# Patient Record
Sex: Female | Born: 1975 | Race: Black or African American | Hispanic: No | Marital: Single | Smoking: Former smoker
Health system: Southern US, Community
[De-identification: ages and names within clinical notes are randomized; demographics above are authoritative.]

## PROBLEM LIST (undated history)

## (undated) ENCOUNTER — Emergency Department (HOSPITAL_COMMUNITY): Payer: Medicaid Other

## (undated) DIAGNOSIS — F32A Depression, unspecified: Secondary | ICD-10-CM

## (undated) DIAGNOSIS — F419 Anxiety disorder, unspecified: Secondary | ICD-10-CM

## (undated) DIAGNOSIS — D219 Benign neoplasm of connective and other soft tissue, unspecified: Secondary | ICD-10-CM

## (undated) DIAGNOSIS — F329 Major depressive disorder, single episode, unspecified: Secondary | ICD-10-CM

## (undated) DIAGNOSIS — M79606 Pain in leg, unspecified: Secondary | ICD-10-CM

## (undated) DIAGNOSIS — Z8719 Personal history of other diseases of the digestive system: Secondary | ICD-10-CM

## (undated) DIAGNOSIS — G43909 Migraine, unspecified, not intractable, without status migrainosus: Secondary | ICD-10-CM

## (undated) DIAGNOSIS — M5430 Sciatica, unspecified side: Secondary | ICD-10-CM

## (undated) HISTORY — DX: Depression, unspecified: F32.A

## (undated) HISTORY — DX: Benign neoplasm of connective and other soft tissue, unspecified: D21.9

## (undated) HISTORY — PX: WISDOM TOOTH EXTRACTION: SHX21

## (undated) HISTORY — DX: Anxiety disorder, unspecified: F41.9

## (undated) HISTORY — PX: TUBAL LIGATION: SHX77

---
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
## 1999-09-14 ENCOUNTER — Emergency Department (HOSPITAL_COMMUNITY): Admission: EM | Admit: 1999-09-14 | Discharge: 1999-09-14 | Payer: Self-pay | Admitting: Internal Medicine

## 2002-08-07 ENCOUNTER — Inpatient Hospital Stay (HOSPITAL_COMMUNITY): Admission: AD | Admit: 2002-08-07 | Discharge: 2002-08-07 | Payer: Self-pay | Admitting: Obstetrics and Gynecology

## 2002-12-06 ENCOUNTER — Inpatient Hospital Stay (HOSPITAL_COMMUNITY): Admission: AD | Admit: 2002-12-06 | Discharge: 2002-12-06 | Payer: Self-pay | Admitting: Obstetrics and Gynecology

## 2003-01-18 ENCOUNTER — Emergency Department (HOSPITAL_COMMUNITY): Admission: EM | Admit: 2003-01-18 | Discharge: 2003-01-18 | Payer: Self-pay | Admitting: Emergency Medicine

## 2003-01-20 ENCOUNTER — Emergency Department (HOSPITAL_COMMUNITY): Admission: EM | Admit: 2003-01-20 | Discharge: 2003-01-20 | Payer: Self-pay | Admitting: *Deleted

## 2003-02-10 ENCOUNTER — Ambulatory Visit (HOSPITAL_COMMUNITY): Admission: RE | Admit: 2003-02-10 | Discharge: 2003-02-10 | Payer: Self-pay | Admitting: *Deleted

## 2003-03-18 ENCOUNTER — Inpatient Hospital Stay (HOSPITAL_COMMUNITY): Admission: AD | Admit: 2003-03-18 | Discharge: 2003-03-18 | Payer: Self-pay | Admitting: *Deleted

## 2003-03-20 ENCOUNTER — Encounter: Admission: RE | Admit: 2003-03-20 | Discharge: 2003-03-20 | Payer: Self-pay | Admitting: Family Medicine

## 2003-04-10 ENCOUNTER — Ambulatory Visit (HOSPITAL_COMMUNITY): Admission: RE | Admit: 2003-04-10 | Discharge: 2003-04-10 | Payer: Self-pay | Admitting: *Deleted

## 2003-04-10 ENCOUNTER — Encounter: Admission: RE | Admit: 2003-04-10 | Discharge: 2003-04-10 | Payer: Self-pay | Admitting: Family Medicine

## 2003-04-16 ENCOUNTER — Inpatient Hospital Stay (HOSPITAL_COMMUNITY): Admission: AD | Admit: 2003-04-16 | Discharge: 2003-04-16 | Payer: Self-pay | Admitting: Obstetrics and Gynecology

## 2003-04-24 ENCOUNTER — Encounter: Admission: RE | Admit: 2003-04-24 | Discharge: 2003-04-24 | Payer: Self-pay | Admitting: Family Medicine

## 2003-05-08 ENCOUNTER — Encounter: Admission: RE | Admit: 2003-05-08 | Discharge: 2003-05-08 | Payer: Self-pay | Admitting: Family Medicine

## 2003-05-22 ENCOUNTER — Encounter: Admission: RE | Admit: 2003-05-22 | Discharge: 2003-05-22 | Payer: Self-pay | Admitting: *Deleted

## 2003-05-26 ENCOUNTER — Inpatient Hospital Stay (HOSPITAL_COMMUNITY): Admission: AD | Admit: 2003-05-26 | Discharge: 2003-05-26 | Payer: Self-pay | Admitting: Obstetrics & Gynecology

## 2003-06-05 ENCOUNTER — Ambulatory Visit (HOSPITAL_COMMUNITY): Admission: RE | Admit: 2003-06-05 | Discharge: 2003-06-05 | Payer: Self-pay | Admitting: *Deleted

## 2003-06-05 ENCOUNTER — Encounter: Admission: RE | Admit: 2003-06-05 | Discharge: 2003-06-05 | Payer: Self-pay | Admitting: *Deleted

## 2003-06-06 ENCOUNTER — Inpatient Hospital Stay (HOSPITAL_COMMUNITY): Admission: RE | Admit: 2003-06-06 | Discharge: 2003-06-06 | Payer: Self-pay | Admitting: *Deleted

## 2003-06-09 ENCOUNTER — Inpatient Hospital Stay (HOSPITAL_COMMUNITY): Admission: AD | Admit: 2003-06-09 | Discharge: 2003-06-09 | Payer: Self-pay | Admitting: Family Medicine

## 2003-06-12 ENCOUNTER — Encounter: Admission: RE | Admit: 2003-06-12 | Discharge: 2003-06-12 | Payer: Self-pay | Admitting: Family Medicine

## 2003-06-12 ENCOUNTER — Inpatient Hospital Stay (HOSPITAL_COMMUNITY): Admission: AD | Admit: 2003-06-12 | Discharge: 2003-06-12 | Payer: Self-pay | Admitting: Obstetrics & Gynecology

## 2003-06-14 ENCOUNTER — Inpatient Hospital Stay (HOSPITAL_COMMUNITY): Admission: AD | Admit: 2003-06-14 | Discharge: 2003-06-14 | Payer: Self-pay | Admitting: Family Medicine

## 2003-06-18 ENCOUNTER — Inpatient Hospital Stay (HOSPITAL_COMMUNITY): Admission: AD | Admit: 2003-06-18 | Discharge: 2003-06-22 | Payer: Self-pay | Admitting: Obstetrics and Gynecology

## 2003-06-20 ENCOUNTER — Encounter (INDEPENDENT_AMBULATORY_CARE_PROVIDER_SITE_OTHER): Payer: Self-pay | Admitting: *Deleted

## 2005-03-09 ENCOUNTER — Ambulatory Visit: Payer: Self-pay | Admitting: Internal Medicine

## 2005-07-25 ENCOUNTER — Emergency Department (HOSPITAL_COMMUNITY): Admission: EM | Admit: 2005-07-25 | Discharge: 2005-07-25 | Payer: Self-pay | Admitting: Family Medicine

## 2005-08-21 ENCOUNTER — Emergency Department (HOSPITAL_COMMUNITY): Admission: EM | Admit: 2005-08-21 | Discharge: 2005-08-21 | Payer: Self-pay | Admitting: Emergency Medicine

## 2007-03-12 ENCOUNTER — Emergency Department (HOSPITAL_COMMUNITY): Admission: EM | Admit: 2007-03-12 | Discharge: 2007-03-12 | Payer: Self-pay | Admitting: Family Medicine

## 2007-07-04 ENCOUNTER — Emergency Department (HOSPITAL_COMMUNITY): Admission: EM | Admit: 2007-07-04 | Discharge: 2007-07-04 | Payer: Self-pay | Admitting: Emergency Medicine

## 2007-11-20 ENCOUNTER — Emergency Department (HOSPITAL_COMMUNITY): Admission: EM | Admit: 2007-11-20 | Discharge: 2007-11-20 | Payer: Self-pay | Admitting: Emergency Medicine

## 2008-02-23 ENCOUNTER — Emergency Department (HOSPITAL_COMMUNITY): Admission: EM | Admit: 2008-02-23 | Discharge: 2008-02-23 | Payer: Self-pay | Admitting: Emergency Medicine

## 2008-03-04 ENCOUNTER — Emergency Department (HOSPITAL_COMMUNITY): Admission: EM | Admit: 2008-03-04 | Discharge: 2008-03-04 | Payer: Self-pay | Admitting: Family Medicine

## 2008-03-22 ENCOUNTER — Emergency Department (HOSPITAL_COMMUNITY): Admission: EM | Admit: 2008-03-22 | Discharge: 2008-03-23 | Payer: Self-pay | Admitting: Emergency Medicine

## 2008-04-16 ENCOUNTER — Emergency Department (HOSPITAL_COMMUNITY): Admission: EM | Admit: 2008-04-16 | Discharge: 2008-04-16 | Payer: Self-pay | Admitting: Emergency Medicine

## 2009-03-03 ENCOUNTER — Emergency Department (HOSPITAL_COMMUNITY): Admission: EM | Admit: 2009-03-03 | Discharge: 2009-03-03 | Payer: Self-pay | Admitting: Emergency Medicine

## 2009-05-04 ENCOUNTER — Emergency Department (HOSPITAL_COMMUNITY): Admission: EM | Admit: 2009-05-04 | Discharge: 2009-05-04 | Payer: Self-pay | Admitting: Emergency Medicine

## 2010-04-27 ENCOUNTER — Emergency Department (HOSPITAL_COMMUNITY): Admission: EM | Admit: 2010-04-27 | Discharge: 2010-04-27 | Payer: Self-pay | Admitting: Emergency Medicine

## 2010-09-13 ENCOUNTER — Emergency Department (HOSPITAL_COMMUNITY): Admission: EM | Admit: 2010-09-13 | Discharge: 2010-09-13 | Payer: Self-pay | Admitting: Emergency Medicine

## 2010-12-24 ENCOUNTER — Emergency Department (HOSPITAL_COMMUNITY)
Admission: EM | Admit: 2010-12-24 | Discharge: 2010-12-24 | Payer: Self-pay | Source: Home / Self Care | Admitting: Emergency Medicine

## 2011-02-10 ENCOUNTER — Emergency Department (HOSPITAL_BASED_OUTPATIENT_CLINIC_OR_DEPARTMENT_OTHER)
Admission: EM | Admit: 2011-02-10 | Discharge: 2011-02-10 | Disposition: A | Discharge status: Home / Self Care | Payer: Medicaid Other | Attending: Emergency Medicine | Admitting: Emergency Medicine

## 2011-02-10 DIAGNOSIS — N390 Urinary tract infection, site not specified: Secondary | ICD-10-CM | POA: Insufficient documentation

## 2011-02-10 DIAGNOSIS — K219 Gastro-esophageal reflux disease without esophagitis: Secondary | ICD-10-CM | POA: Insufficient documentation

## 2011-02-10 DIAGNOSIS — R1032 Left lower quadrant pain: Secondary | ICD-10-CM | POA: Insufficient documentation

## 2011-02-10 LAB — URINE MICROSCOPIC-ADD ON

## 2011-02-10 LAB — URINALYSIS, ROUTINE W REFLEX MICROSCOPIC
Bilirubin Urine: NEGATIVE
Glucose, UA: NEGATIVE mg/dL
Hgb urine dipstick: NEGATIVE
Ketones, ur: NEGATIVE mg/dL
Nitrite: NEGATIVE
Protein, ur: NEGATIVE mg/dL
Specific Gravity, Urine: 1.014 (ref 1.005–1.030)
Urobilinogen, UA: 0.2 mg/dL (ref 0.0–1.0)
pH: 8 (ref 5.0–8.0)

## 2011-02-10 LAB — PREGNANCY, URINE: Preg Test, Ur: NEGATIVE

## 2011-02-14 ENCOUNTER — Emergency Department (HOSPITAL_BASED_OUTPATIENT_CLINIC_OR_DEPARTMENT_OTHER)
Admission: EM | Admit: 2011-02-14 | Discharge: 2011-02-14 | Disposition: A | Discharge status: Home / Self Care | Payer: Medicaid Other | Attending: Emergency Medicine | Admitting: Emergency Medicine

## 2011-02-14 DIAGNOSIS — K219 Gastro-esophageal reflux disease without esophagitis: Secondary | ICD-10-CM | POA: Insufficient documentation

## 2011-02-14 DIAGNOSIS — R5381 Other malaise: Secondary | ICD-10-CM | POA: Insufficient documentation

## 2011-02-14 DIAGNOSIS — N390 Urinary tract infection, site not specified: Secondary | ICD-10-CM | POA: Insufficient documentation

## 2011-02-14 DIAGNOSIS — T148XXA Other injury of unspecified body region, initial encounter: Secondary | ICD-10-CM | POA: Insufficient documentation

## 2011-02-14 DIAGNOSIS — X58XXXA Exposure to other specified factors, initial encounter: Secondary | ICD-10-CM | POA: Insufficient documentation

## 2011-02-14 DIAGNOSIS — R5383 Other fatigue: Secondary | ICD-10-CM | POA: Insufficient documentation

## 2011-02-14 LAB — URINE MICROSCOPIC-ADD ON

## 2011-02-14 LAB — BASIC METABOLIC PANEL
BUN: 12 mg/dL (ref 6–23)
CO2: 21 mEq/L (ref 19–32)
Calcium: 9.3 mg/dL (ref 8.4–10.5)
Chloride: 103 mEq/L (ref 96–112)
Creatinine, Ser: 0.8 mg/dL (ref 0.4–1.2)
GFR calc Af Amer: >60 mL/min (ref >60)
GFR calc non Af Amer: >60 mL/min (ref >60)
Glucose, Bld: 92 mg/dL (ref 70–99)
Potassium: 4 mEq/L (ref 3.5–5.1)
Sodium: 140 mEq/L (ref 135–145)

## 2011-02-14 LAB — CBC
HCT: 37 % (ref 36.0–46.0)
Hemoglobin: 12.7 g/dL (ref 12.0–15.0)
MCH: 30.6 pg (ref 26.0–34.0)
MCHC: 34.3 g/dL (ref 30.0–36.0)
MCV: 89.2 fL (ref 78.0–100.0)
Platelets: 105 10*3/uL — ABNORMAL LOW (ref 150–400)
RBC: 4.15 MIL/uL (ref 3.87–5.11)
RDW: 11.3 % — ABNORMAL LOW (ref 11.5–15.5)
WBC: 2.2 10*3/uL — ABNORMAL LOW (ref 4.0–10.5)

## 2011-02-14 LAB — DIFFERENTIAL
Basophils Absolute: 0 10*3/uL (ref 0.0–0.1)
Basophils Relative: 0 % (ref 0–1)
Eosinophils Absolute: 0.1 10*3/uL (ref 0.0–0.7)
Eosinophils Relative: 5 % (ref 0–5)
Lymphocytes Relative: 10 % — ABNORMAL LOW (ref 12–46)
Lymphs Abs: 0.2 10*3/uL — ABNORMAL LOW (ref 0.7–4.0)
Monocytes Absolute: 0.2 10*3/uL (ref 0.1–1.0)
Monocytes Relative: 8 % (ref 3–12)
Neutro Abs: 1.7 10*3/uL (ref 1.7–7.7)
Neutrophils Relative %: 77 % (ref 43–77)

## 2011-02-14 LAB — URINALYSIS, ROUTINE W REFLEX MICROSCOPIC
Bilirubin Urine: NEGATIVE
Glucose, UA: NEGATIVE mg/dL
Hgb urine dipstick: NEGATIVE
Ketones, ur: 15 mg/dL — AB
Nitrite: NEGATIVE
Protein, ur: NEGATIVE mg/dL
Specific Gravity, Urine: 1.035 — ABNORMAL HIGH (ref 1.005–1.030)
Urobilinogen, UA: 1 mg/dL (ref 0.0–1.0)
pH: 6 (ref 5.0–8.0)

## 2011-02-16 LAB — URINE CULTURE
Colony Count: 5000
Culture  Setup Time: 201203130614

## 2011-02-21 LAB — CBC
HCT: 35 % — ABNORMAL LOW (ref 36.0–46.0)
Hemoglobin: 12 g/dL (ref 12.0–15.0)
MCHC: 34.1 g/dL (ref 30.0–36.0)
MCV: 93.2 fL (ref 78.0–100.0)
Platelets: 175 10*3/uL (ref 150–400)
RBC: 3.76 MIL/uL — ABNORMAL LOW (ref 3.87–5.11)
RDW: 12.6 % (ref 11.5–15.5)
WBC: 3.7 10*3/uL — ABNORMAL LOW (ref 4.0–10.5)

## 2011-02-21 LAB — COMPREHENSIVE METABOLIC PANEL
ALT: 11 U/L (ref 0–35)
AST: 14 U/L (ref 0–37)
Albumin: 3.7 g/dL (ref 3.5–5.2)
Alkaline Phosphatase: 42 U/L (ref 39–117)
BUN: 7 mg/dL (ref 6–23)
CO2: 28 mEq/L (ref 19–32)
Calcium: 9.2 mg/dL (ref 8.4–10.5)
Chloride: 107 mEq/L (ref 96–112)
Creatinine, Ser: 0.66 mg/dL (ref 0.4–1.2)
GFR calc Af Amer: >60 mL/min (ref >60)
GFR calc non Af Amer: >60 mL/min (ref >60)
Glucose, Bld: 82 mg/dL (ref 70–99)
Potassium: 3.5 mEq/L (ref 3.5–5.1)
Sodium: 140 mEq/L (ref 135–145)
Total Bilirubin: 0.4 mg/dL (ref 0.3–1.2)
Total Protein: 7.2 g/dL (ref 6.0–8.3)

## 2011-02-21 LAB — DIFFERENTIAL
Basophils Absolute: 0.1 10*3/uL (ref 0.0–0.1)
Basophils Relative: 2 % — ABNORMAL HIGH (ref 0–1)
Eosinophils Absolute: 0.1 10*3/uL (ref 0.0–0.7)
Eosinophils Relative: 3 % (ref 0–5)
Lymphocytes Relative: 38 % (ref 12–46)
Lymphs Abs: 1.4 10*3/uL (ref 0.7–4.0)
Monocytes Absolute: 0.3 10*3/uL (ref 0.1–1.0)
Monocytes Relative: 9 % (ref 3–12)
Neutro Abs: 1.8 10*3/uL (ref 1.7–7.7)
Neutrophils Relative %: 49 % (ref 43–77)

## 2011-02-21 LAB — POCT CARDIAC MARKERS
CKMB, poc: <1 ng/mL — ABNORMAL LOW (ref 1.0–8.0)
Myoglobin, poc: 29.8 ng/mL (ref 12–200)
Troponin i, poc: <0.05 ng/mL (ref 0.00–0.09)

## 2011-03-08 ENCOUNTER — Other Ambulatory Visit: Payer: Self-pay | Admitting: Family Medicine

## 2011-03-08 DIAGNOSIS — E049 Nontoxic goiter, unspecified: Secondary | ICD-10-CM

## 2011-03-09 ENCOUNTER — Inpatient Hospital Stay: Admission: RE | Admit: 2011-03-09 | Payer: Medicaid Other | Source: Ambulatory Visit

## 2011-03-10 ENCOUNTER — Other Ambulatory Visit: Payer: Self-pay | Admitting: Family Medicine

## 2011-03-10 ENCOUNTER — Ambulatory Visit
Admission: RE | Admit: 2011-03-10 | Discharge: 2011-03-10 | Disposition: A | Discharge status: Home / Self Care | Payer: Medicaid Other | Source: Ambulatory Visit | Attending: Family Medicine | Admitting: Family Medicine

## 2011-03-10 DIAGNOSIS — E049 Nontoxic goiter, unspecified: Secondary | ICD-10-CM

## 2011-03-10 DIAGNOSIS — M25559 Pain in unspecified hip: Secondary | ICD-10-CM

## 2011-03-10 DIAGNOSIS — R51 Headache: Secondary | ICD-10-CM

## 2011-03-15 LAB — RAPID STREP SCREEN (MED CTR MEBANE ONLY): Streptococcus, Group A Screen (Direct): NEGATIVE

## 2011-04-22 NOTE — Op Note (Signed)
   NAMEGENNESIS, HOGLAND NO.:  [id number]   MEDICAL RECORD NO.:  [id number]                   PATIENT TYPE:  INP   LOCATION:  9105                                 FACILITY:  [facility]   PHYSICIAN:  Clement Husbands, M.D.         DATE OF BIRTH:  23-Oct-1976   DATE OF PROCEDURE:  06/20/2003  DATE OF DISCHARGE:  06/22/2003                                 OPERATIVE REPORT   PREOPERATIVE DIAGNOSIS:  Requested sterilization.   POSTOPERATIVE DIAGNOSIS:  Requested sterilization.   OPERATION/PROCEDURE:  Postpartum bilateral tubal ligation with partial  salpingectomy.   SURGEON:  Burnadette Peter, M.D.   ANESTHESIA:  Epidural.   DESCRIPTION OF PROCEDURE:  With the patient under satisfactory epidural  anesthesia in the supine position, the abdomen was prepped and draped.  Small transverse infraumbilical skin incision was made and then sharply  taken down to the rectus fascia and on into the peritoneal cavity.  The  fundal portion of the uterus was identified and visualized.  The right  fallopian tube was grasped in its mid portion.  It was then ligated with 0  plain catgut.  A second ligature was secured.  The ligated segment was then  transected with nonopposing edges.  Hemostasis was fine.  Similarly on the  left side, the left fallopian tube was identified, doubly ligated with 0  plain catgut and the ligated segment excised with nonopposing edges.  Hemostasis was good.   Peritoneum was then closed with a running 3-0 Vicryl suture.  Rectus fascia  was then approximated with a running 2-0 Vicryl suture.  Skin edges  approximated with a subcuticular 3-0 Vicryl suture.  Pressure dressing was  applied.  Sponge and needle count was correct.  Estimated blood loss  negligible.  He tolerated the procedure well and returned to the recovery  room in satisfactory condition.                                                Clement Husbands, M.D.    EFR/MEDQ   D:  06/20/2003  T:  06/22/2003  Job:  [id number]

## 2011-05-02 ENCOUNTER — Emergency Department (HOSPITAL_COMMUNITY)
Admission: EM | Admit: 2011-05-02 | Discharge: 2011-05-02 | Disposition: A | Discharge status: Home / Self Care | Payer: Medicaid Other | Attending: Emergency Medicine | Admitting: Emergency Medicine

## 2011-05-02 DIAGNOSIS — E785 Hyperlipidemia, unspecified: Secondary | ICD-10-CM | POA: Insufficient documentation

## 2011-05-02 DIAGNOSIS — Z9889 Other specified postprocedural states: Secondary | ICD-10-CM | POA: Insufficient documentation

## 2011-05-02 DIAGNOSIS — R10819 Abdominal tenderness, unspecified site: Secondary | ICD-10-CM | POA: Insufficient documentation

## 2011-05-02 DIAGNOSIS — K219 Gastro-esophageal reflux disease without esophagitis: Secondary | ICD-10-CM | POA: Insufficient documentation

## 2011-05-02 DIAGNOSIS — R350 Frequency of micturition: Secondary | ICD-10-CM | POA: Insufficient documentation

## 2011-05-02 DIAGNOSIS — N390 Urinary tract infection, site not specified: Secondary | ICD-10-CM | POA: Insufficient documentation

## 2011-05-02 LAB — URINALYSIS, ROUTINE W REFLEX MICROSCOPIC
Bilirubin Urine: NEGATIVE
Glucose, UA: NEGATIVE mg/dL
Ketones, ur: NEGATIVE mg/dL
Nitrite: NEGATIVE
Protein, ur: NEGATIVE mg/dL
Specific Gravity, Urine: 1.019 (ref 1.005–1.030)
Urobilinogen, UA: 1 mg/dL (ref 0.0–1.0)
pH: 6 (ref 5.0–8.0)

## 2011-05-02 LAB — URINE MICROSCOPIC-ADD ON

## 2011-05-02 LAB — PREGNANCY, URINE: Preg Test, Ur: NEGATIVE

## 2011-05-03 ENCOUNTER — Observation Stay (HOSPITAL_COMMUNITY)
Admission: EM | Admit: 2011-05-03 | Discharge: 2011-05-04 | Disposition: A | Discharge status: Home / Self Care | Payer: Medicaid Other | Attending: Emergency Medicine | Admitting: Emergency Medicine

## 2011-05-03 DIAGNOSIS — R110 Nausea: Secondary | ICD-10-CM | POA: Insufficient documentation

## 2011-05-03 DIAGNOSIS — N12 Tubulo-interstitial nephritis, not specified as acute or chronic: Secondary | ICD-10-CM | POA: Insufficient documentation

## 2011-05-03 DIAGNOSIS — R51 Headache: Secondary | ICD-10-CM | POA: Insufficient documentation

## 2011-05-03 DIAGNOSIS — J029 Acute pharyngitis, unspecified: Principal | ICD-10-CM | POA: Insufficient documentation

## 2011-05-03 LAB — CBC
HCT: 32.4 % — ABNORMAL LOW (ref 36.0–46.0)
Hemoglobin: 10.6 g/dL — ABNORMAL LOW (ref 12.0–15.0)
MCH: 30 pg (ref 26.0–34.0)
MCHC: 32.7 g/dL (ref 30.0–36.0)
MCV: 91.8 fL (ref 78.0–100.0)
Platelets: 169 10*3/uL (ref 150–400)
RBC: 3.53 MIL/uL — ABNORMAL LOW (ref 3.87–5.11)
RDW: 12.4 % (ref 11.5–15.5)
WBC: 4.8 10*3/uL (ref 4.0–10.5)

## 2011-05-03 LAB — URINALYSIS, ROUTINE W REFLEX MICROSCOPIC
Bilirubin Urine: NEGATIVE
Glucose, UA: NEGATIVE mg/dL
Ketones, ur: NEGATIVE mg/dL
Nitrite: NEGATIVE
Protein, ur: NEGATIVE mg/dL
Specific Gravity, Urine: 1.013 (ref 1.005–1.030)
Urobilinogen, UA: 0.2 mg/dL (ref 0.0–1.0)
pH: 5.5 (ref 5.0–8.0)

## 2011-05-03 LAB — POCT PREGNANCY, URINE: Preg Test, Ur: NEGATIVE

## 2011-05-03 LAB — URINE MICROSCOPIC-ADD ON

## 2011-05-03 LAB — RAPID STREP SCREEN (MED CTR MEBANE ONLY): Streptococcus, Group A Screen (Direct): NEGATIVE

## 2011-05-03 LAB — BASIC METABOLIC PANEL
BUN: 8 mg/dL (ref 6–23)
CO2: 21 mEq/L (ref 19–32)
Calcium: 7.8 mg/dL — ABNORMAL LOW (ref 8.4–10.5)
Chloride: 106 mEq/L (ref 96–112)
Creatinine, Ser: 0.75 mg/dL (ref 0.4–1.2)
GFR calc Af Amer: >60 mL/min (ref >60)
GFR calc non Af Amer: >60 mL/min (ref >60)
Glucose, Bld: 99 mg/dL (ref 70–99)
Potassium: 3.9 mEq/L (ref 3.5–5.1)
Sodium: 136 mEq/L (ref 135–145)

## 2011-05-03 LAB — DIFFERENTIAL
Basophils Absolute: 0 10*3/uL (ref 0.0–0.1)
Basophils Relative: 0 % (ref 0–1)
Eosinophils Absolute: 0.1 10*3/uL (ref 0.0–0.7)
Eosinophils Relative: 3 % (ref 0–5)
Lymphocytes Relative: 7 % — ABNORMAL LOW (ref 12–46)
Lymphs Abs: 0.3 10*3/uL — ABNORMAL LOW (ref 0.7–4.0)
Monocytes Absolute: 0.3 10*3/uL (ref 0.1–1.0)
Monocytes Relative: 7 % (ref 3–12)
Neutro Abs: 4 10*3/uL (ref 1.7–7.7)
Neutrophils Relative %: 84 % — ABNORMAL HIGH (ref 43–77)

## 2011-05-05 ENCOUNTER — Other Ambulatory Visit: Payer: Self-pay | Admitting: Family Medicine

## 2011-05-05 DIAGNOSIS — R52 Pain, unspecified: Secondary | ICD-10-CM

## 2011-05-05 DIAGNOSIS — R252 Cramp and spasm: Secondary | ICD-10-CM

## 2011-05-16 ENCOUNTER — Other Ambulatory Visit: Payer: Self-pay | Admitting: Family Medicine

## 2011-05-16 ENCOUNTER — Inpatient Hospital Stay: Admission: RE | Admit: 2011-05-16 | Payer: Medicaid Other | Source: Ambulatory Visit

## 2011-05-16 DIAGNOSIS — R52 Pain, unspecified: Secondary | ICD-10-CM

## 2011-05-18 ENCOUNTER — Other Ambulatory Visit: Payer: Medicaid Other

## 2011-05-18 ENCOUNTER — Ambulatory Visit
Admission: RE | Admit: 2011-05-18 | Discharge: 2011-05-18 | Disposition: A | Discharge status: Home / Self Care | Payer: Medicaid Other | Source: Ambulatory Visit | Attending: Family Medicine | Admitting: Family Medicine

## 2011-05-18 DIAGNOSIS — R52 Pain, unspecified: Secondary | ICD-10-CM

## 2011-05-23 ENCOUNTER — Other Ambulatory Visit: Payer: Medicaid Other

## 2011-05-24 ENCOUNTER — Ambulatory Visit
Admission: RE | Admit: 2011-05-24 | Discharge: 2011-05-24 | Disposition: A | Discharge status: Home / Self Care | Payer: Medicaid Other | Source: Ambulatory Visit | Attending: Family Medicine | Admitting: Family Medicine

## 2011-05-24 ENCOUNTER — Other Ambulatory Visit: Payer: Self-pay | Admitting: Family Medicine

## 2011-05-24 DIAGNOSIS — M2480 Other specific joint derangements of unspecified joint, not elsewhere classified: Secondary | ICD-10-CM

## 2011-05-31 ENCOUNTER — Encounter (INDEPENDENT_AMBULATORY_CARE_PROVIDER_SITE_OTHER): Payer: Medicaid Other | Admitting: Vascular Surgery

## 2011-05-31 DIAGNOSIS — M79609 Pain in unspecified limb: Secondary | ICD-10-CM

## 2011-06-01 NOTE — Consult Note (Signed)
NEW PATIENT CONSULTATION  Brianna Velez, Brianna Velez DOB:  06-Oct-1976                                       05/31/2011 ZOXWR#:[id number]  This is a 35 year old healthy female who is complaining of pain in the left leg over the last 2-3 years.  She described this as a throbbing discomfort in the thigh and calf with an occasional sharp component extending down into the foot.  This is not related to activity or rest and is not relieved by activity or rest.  She has no history of DVT, thrombophlebitis or arterial insufficiency but states that the symptoms did start after she had a traumatic dislocation of her left hip which was reduced 3 years ago.  She has no history of back problems.  CHRONIC MEDICAL PROBLEMS:  None.  Denies diabetes, hypertension, coronary disease, COPD or stroke.  SOCIAL HISTORY:  She is single, has 2 children.  Stopped smoking 7 years ago, does not use alcohol.  FAMILY HISTORY:  Positive for diabetes in a sister, coronary artery disease and stroke in grandmother.  REVIEW OF SYSTEMS:  Totally negative in the complete review of systems.  PHYSICAL EXAMINATION:  Blood pressure 97/64, heart rate 89, respirations 16.  General:  She is a well-developed, well-nourished female in no apparent distress alert and oriented x3.  HEENT:  Exam normal for age. EOMs intact.  Lungs:  Clear to auscultation.  No rhonchi or wheezing. Cardiovascular:  Regular rhythm.  No murmurs.  Carotid pulses 3+, no bruits.  Abdomen:  Soft, nontender with no masses.  Musculoskeletal: Exam is free of major deformities.  Neurologic:  Normal.  Skin:  Free of rashes.  Lower extremity exam reveals 3+ femoral, popliteal, dorsalis pedis and posterior tibial pulses palpable.  There is no evidence of venous insufficiency.  No bulging varicosities and  no distal edema.  I reassured regarding these findings that her symptoms are not due to arterial or venous disease.  She possibly has a nerve  compression syndrome which could be related to her hip dislocation or lumbar disk disease.  She does not need any further vascular workup this time.    Quita Skye Hart [place], M.D. Electronically Signed  JDL/MEDQ  D:  05/31/2011  T:  06/01/2011  Job:  [id number]  cc:   Lorelle Formosa, M.D. Dr. Clide Deutscher

## 2011-09-09 LAB — I-STAT 8, (EC8 V) (CONVERTED LAB)
Acid-Base Excess: 1
BUN: 10
Bicarbonate: 26.3 — ABNORMAL HIGH
Chloride: 105
Glucose, Bld: 89
HCT: 41
Hemoglobin: 13.9
Operator id: 196461
Potassium: 4.2
Sodium: 138
TCO2: 28
pCO2, Ven: 42.9 — ABNORMAL LOW
pH, Ven: 7.395 — ABNORMAL HIGH

## 2011-09-09 LAB — CBC
HCT: 37.1
Hemoglobin: 12.4
MCHC: 33.3
MCV: 94.5
Platelets: 223
RBC: 3.92
RDW: 12
WBC: 3.6 — ABNORMAL LOW

## 2011-09-09 LAB — DIFFERENTIAL
Basophils Absolute: 0.1
Basophils Relative: 2 — ABNORMAL HIGH
Eosinophils Absolute: 0.1 — ABNORMAL LOW
Eosinophils Relative: 2
Lymphocytes Relative: 47 — ABNORMAL HIGH
Lymphs Abs: 1.7
Monocytes Absolute: 0.2
Monocytes Relative: 6
Neutro Abs: 1.6 — ABNORMAL LOW
Neutrophils Relative %: 44

## 2011-09-09 LAB — POCT CARDIAC MARKERS
CKMB, poc: <1 — ABNORMAL LOW
Myoglobin, poc: 26.4
Operator id: 196461
Troponin i, poc: <0.05

## 2011-09-09 LAB — POCT PREGNANCY, URINE
Operator id: 196461
Preg Test, Ur: NEGATIVE

## 2011-09-09 LAB — POCT I-STAT CREATININE
Creatinine, Ser: 1
Operator id: 196461

## 2011-09-09 LAB — D-DIMER, QUANTITATIVE (NOT AT ARMC): D-Dimer, Quant: <0.22

## 2011-09-29 ENCOUNTER — Emergency Department (HOSPITAL_COMMUNITY)
Admission: EM | Admit: 2011-09-29 | Discharge: 2011-09-29 | Discharge status: Against Medical Advice | Payer: Medicaid Other | Source: Home / Self Care | Attending: Emergency Medicine | Admitting: Emergency Medicine

## 2011-09-29 ENCOUNTER — Emergency Department (HOSPITAL_COMMUNITY)
Admission: EM | Admit: 2011-09-29 | Discharge: 2011-09-29 | Disposition: A | Discharge status: Home / Self Care | Payer: Medicaid Other | Attending: Emergency Medicine | Admitting: Emergency Medicine

## 2011-09-29 DIAGNOSIS — R12 Heartburn: Secondary | ICD-10-CM | POA: Insufficient documentation

## 2011-09-29 DIAGNOSIS — R109 Unspecified abdominal pain: Secondary | ICD-10-CM | POA: Insufficient documentation

## 2011-09-29 DIAGNOSIS — R630 Anorexia: Secondary | ICD-10-CM | POA: Insufficient documentation

## 2011-09-29 DIAGNOSIS — K219 Gastro-esophageal reflux disease without esophagitis: Secondary | ICD-10-CM | POA: Insufficient documentation

## 2011-09-29 DIAGNOSIS — R112 Nausea with vomiting, unspecified: Secondary | ICD-10-CM | POA: Insufficient documentation

## 2011-09-29 DIAGNOSIS — R197 Diarrhea, unspecified: Secondary | ICD-10-CM | POA: Insufficient documentation

## 2011-09-29 DIAGNOSIS — R10819 Abdominal tenderness, unspecified site: Secondary | ICD-10-CM | POA: Insufficient documentation

## 2011-09-29 DIAGNOSIS — E785 Hyperlipidemia, unspecified: Secondary | ICD-10-CM | POA: Insufficient documentation

## 2011-09-29 DIAGNOSIS — N898 Other specified noninflammatory disorders of vagina: Secondary | ICD-10-CM | POA: Insufficient documentation

## 2011-09-29 DIAGNOSIS — R509 Fever, unspecified: Secondary | ICD-10-CM | POA: Insufficient documentation

## 2011-09-29 DIAGNOSIS — N949 Unspecified condition associated with female genital organs and menstrual cycle: Secondary | ICD-10-CM | POA: Insufficient documentation

## 2011-09-29 DIAGNOSIS — N72 Inflammatory disease of cervix uteri: Secondary | ICD-10-CM | POA: Insufficient documentation

## 2011-09-29 LAB — POCT I-STAT, CHEM 8
BUN: 6 mg/dL (ref 6–23)
Calcium, Ion: 1.17 mmol/L (ref 1.12–1.32)
Chloride: 104 mEq/L (ref 96–112)
Creatinine, Ser: 0.7 mg/dL (ref 0.50–1.10)
Glucose, Bld: 70 mg/dL (ref 70–99)
HCT: 41 % (ref 36.0–46.0)
Hemoglobin: 13.9 g/dL (ref 12.0–15.0)
Potassium: 4 mEq/L (ref 3.5–5.1)
Sodium: 139 mEq/L (ref 135–145)
TCO2: 22 mmol/L (ref 0–100)

## 2011-09-29 LAB — URINALYSIS, ROUTINE W REFLEX MICROSCOPIC
Bilirubin Urine: NEGATIVE
Glucose, UA: NEGATIVE mg/dL
Hgb urine dipstick: NEGATIVE
Ketones, ur: 15 mg/dL — AB
Nitrite: NEGATIVE
Protein, ur: NEGATIVE mg/dL
Specific Gravity, Urine: 1.019 (ref 1.005–1.030)
Urobilinogen, UA: 1 mg/dL (ref 0.0–1.0)
pH: 7 (ref 5.0–8.0)

## 2011-09-29 LAB — URINE MICROSCOPIC-ADD ON

## 2011-09-29 LAB — WET PREP, GENITAL
Clue Cells Wet Prep HPF POC: NONE SEEN
Trich, Wet Prep: NONE SEEN
Yeast Wet Prep HPF POC: NONE SEEN

## 2011-09-29 LAB — POCT PREGNANCY, URINE: Preg Test, Ur: NEGATIVE

## 2011-09-30 LAB — URINE CULTURE
Colony Count: NO GROWTH
Culture  Setup Time: 201210260217
Culture: NO GROWTH

## 2011-09-30 LAB — GC/CHLAMYDIA PROBE AMP, GENITAL
Chlamydia, DNA Probe: NEGATIVE
GC Probe Amp, Genital: NEGATIVE

## 2012-01-19 IMAGING — US US EXTREM LOW VENOUS*L*
1 series · 14 of 24 positions shown · non-contrast
Comparison: None.

CLINICAL DATA: Left leg pain and swelling

LEFT LOWER EXTREMITY VENOUS DUPLEX ULTRASOUND
TECHNIQUE: Gray-scale sonography with graded compression, as well
as color Doppler and duplex ultrasound were performed to evaluate
the deep venous system of the lower extremity from the level of the
common femoral vein through the popliteal and proximal calf veins.
Spectral Doppler was utilized to evaluate flow at rest and with
distal augmentation maneuvers.

[Series 1: us extrem low venous*left* · 14 of 26 slices shown]
[im 1/26]
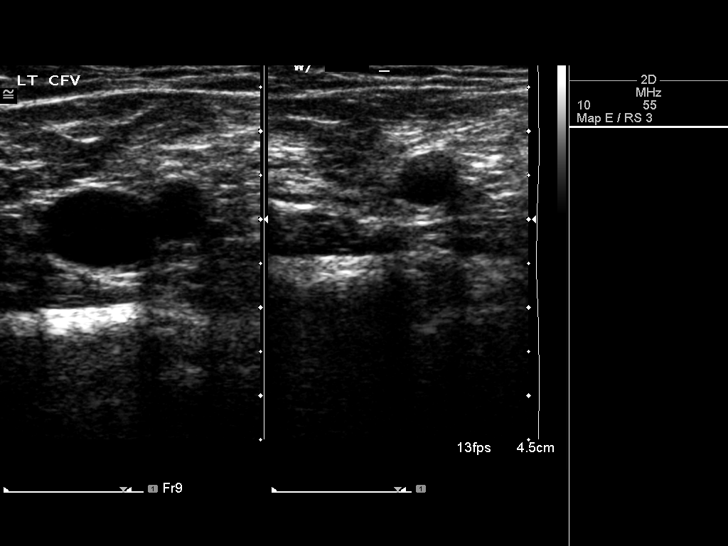
[im 3/26]
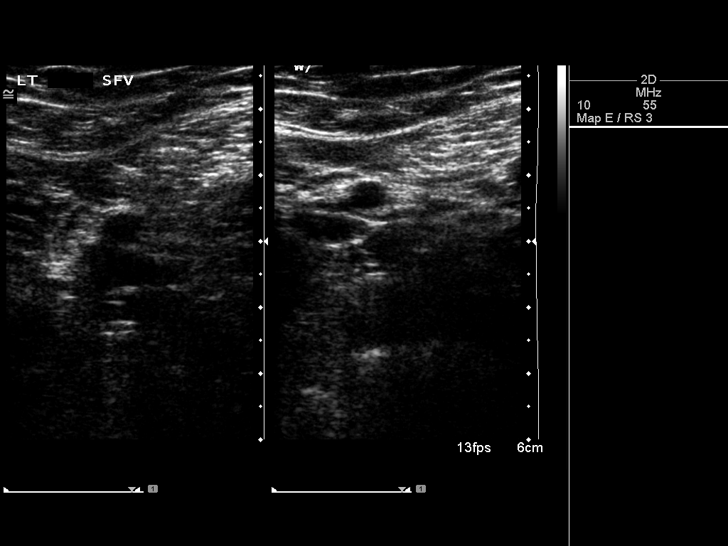
[im 5/26]
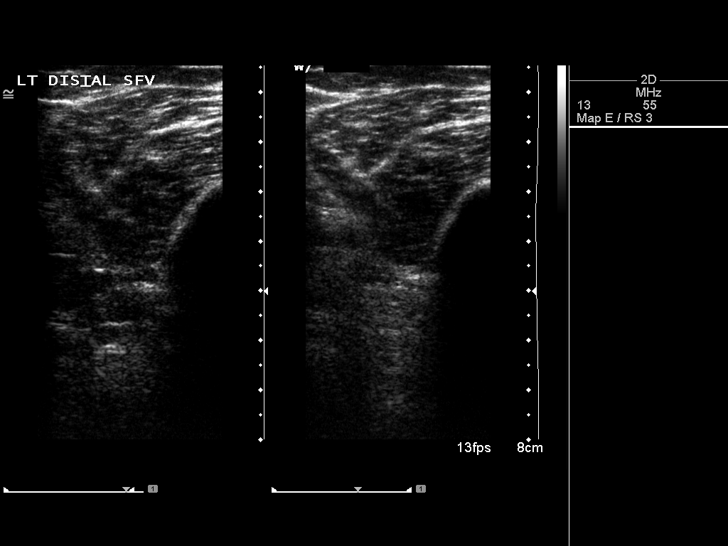
[im 7/26]
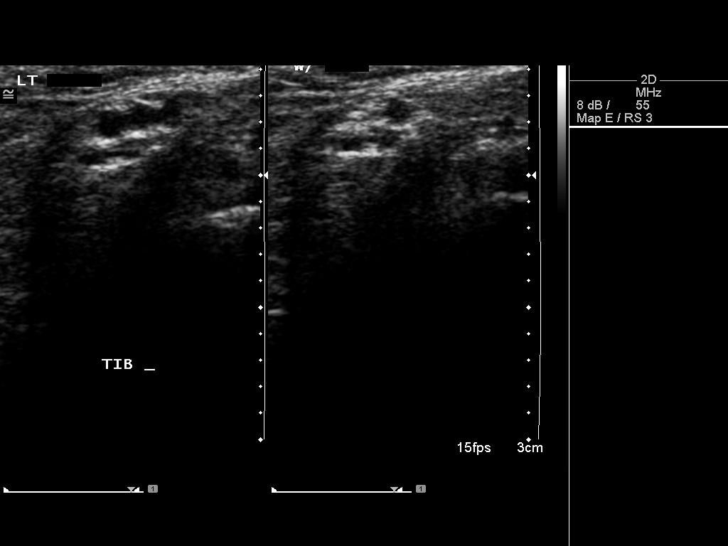
[im 8/26]
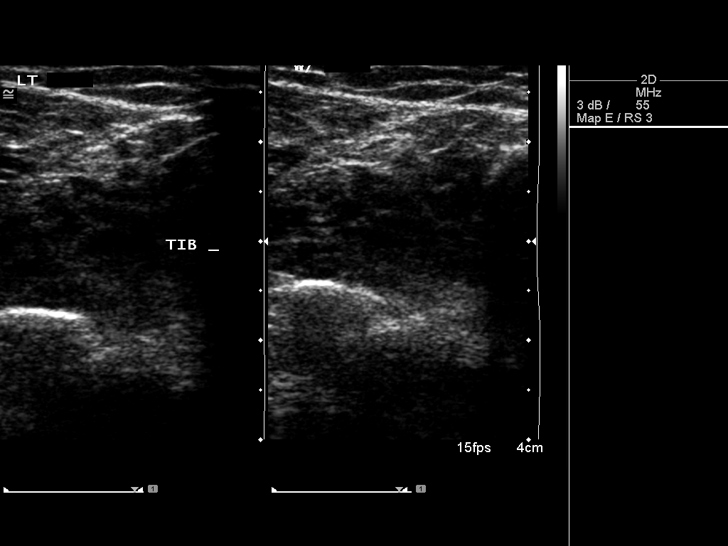
[im 10/26]
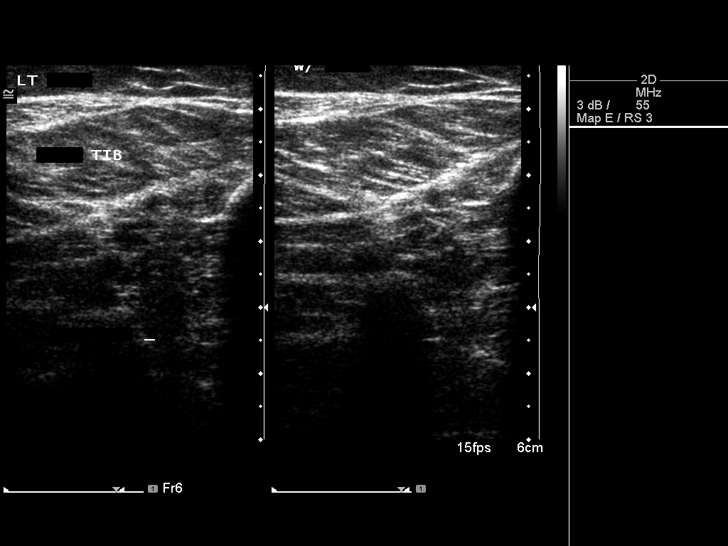
[im 12/26]
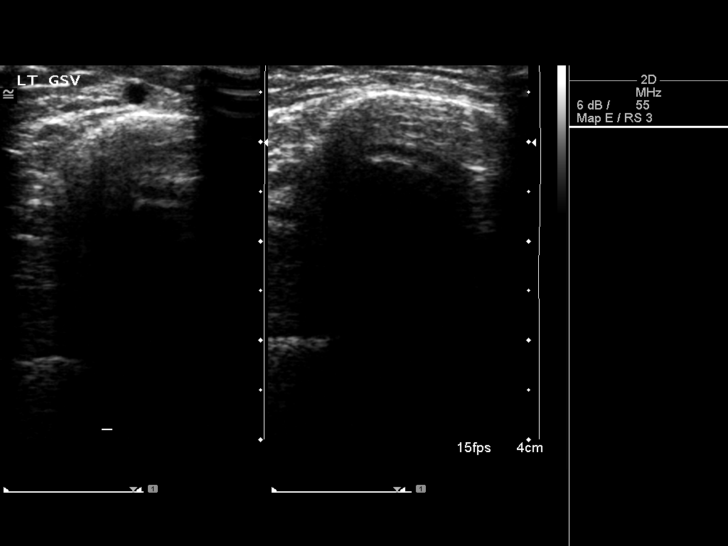
[im 14/26]
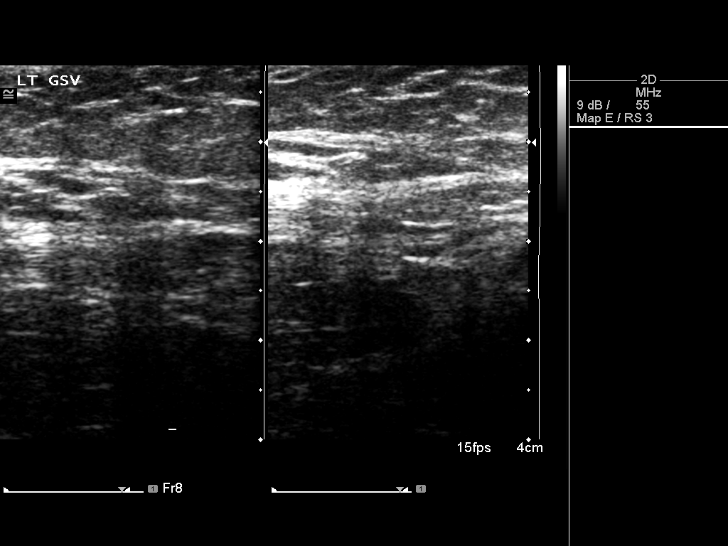
[im 16/26]
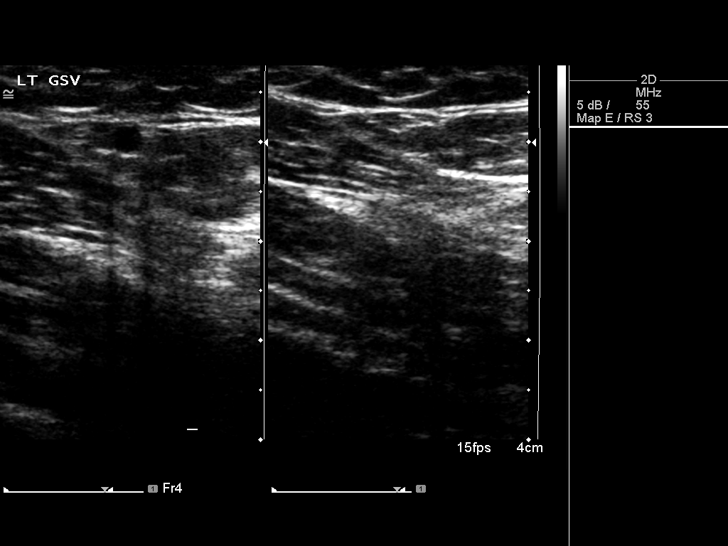
[im 18/26]
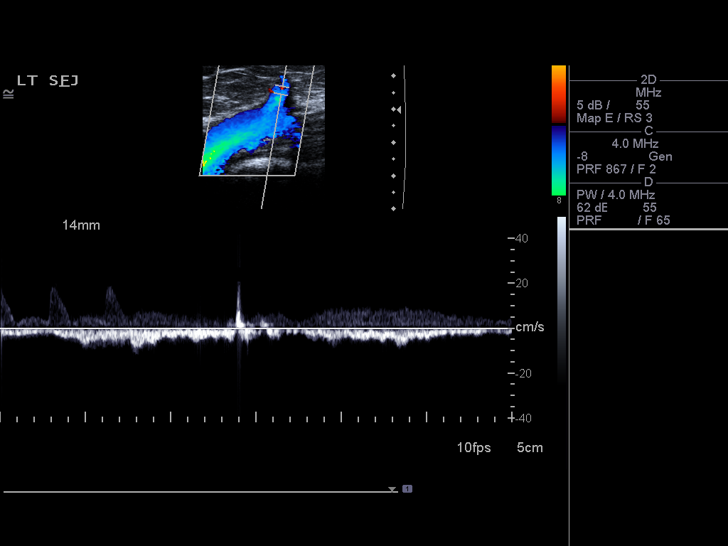
[im 20/26]
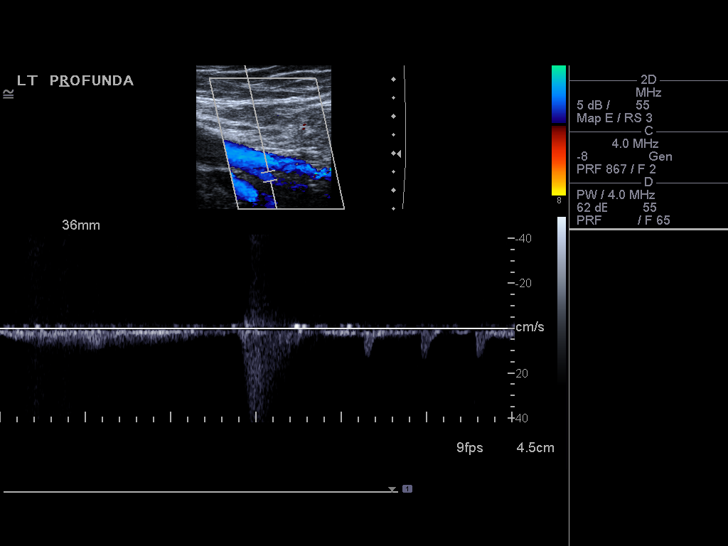
[im 21/26]
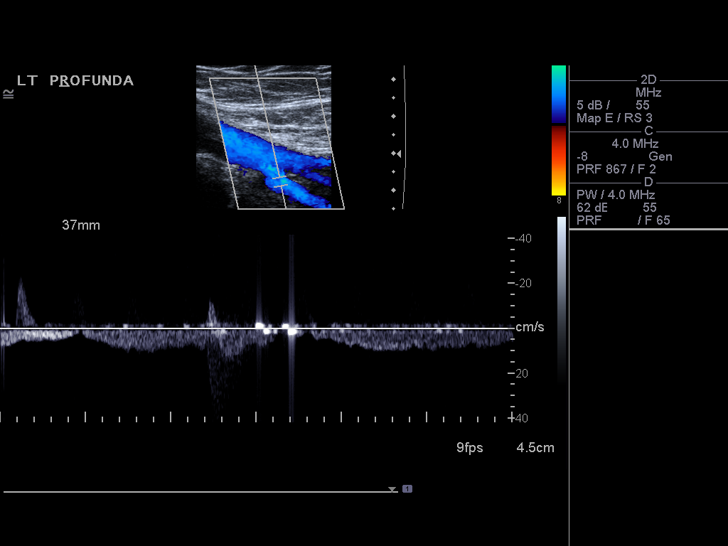
[im 23/26]
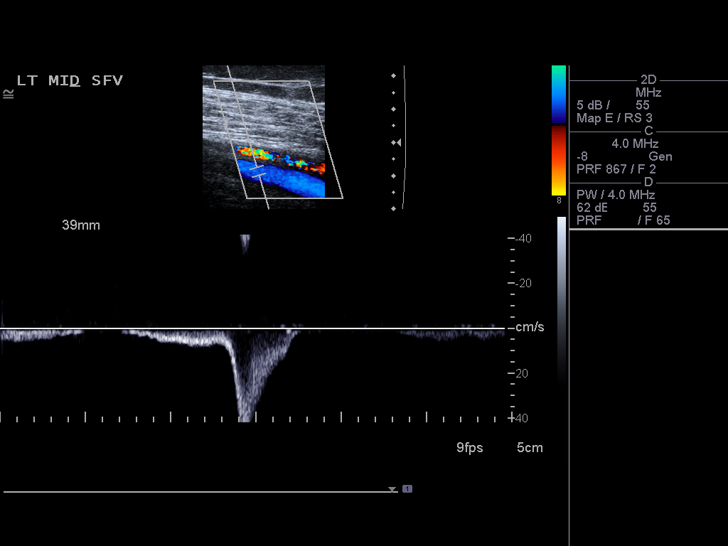
[im 26/26]
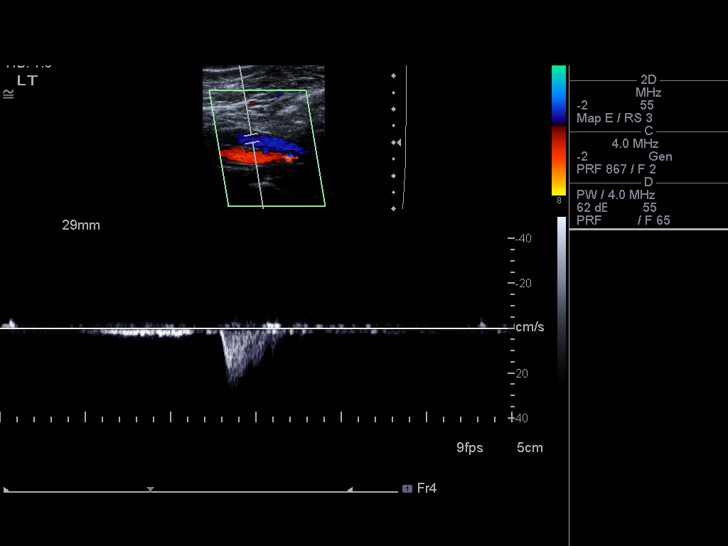

[14 of 24 positions shown; findings below may reference images not displayed]

FINDINGS: Normal compressibility of the common femoral,
superficial femoral, and popliteal veins is demonstrated, as well
as the visualized proximal calf veins.  No filling defects to
suggest DVT on grayscale or color Doppler imaging.  Doppler
waveforms show normal direction of venous flow, normal respiratory
phasicity and response to augmentation.
IMPRESSION: No evidence of lower extremity deep vein thrombosis.

## 2012-03-03 ENCOUNTER — Encounter (HOSPITAL_COMMUNITY): Payer: Self-pay

## 2012-03-03 ENCOUNTER — Emergency Department (HOSPITAL_COMMUNITY)
Admission: EM | Admit: 2012-03-03 | Discharge: 2012-03-03 | Disposition: A | Discharge status: Home / Self Care | Payer: Medicaid Other | Attending: Emergency Medicine | Admitting: Emergency Medicine

## 2012-03-03 DIAGNOSIS — A599 Trichomoniasis, unspecified: Secondary | ICD-10-CM

## 2012-03-03 DIAGNOSIS — N390 Urinary tract infection, site not specified: Secondary | ICD-10-CM | POA: Insufficient documentation

## 2012-03-03 DIAGNOSIS — N72 Inflammatory disease of cervix uteri: Secondary | ICD-10-CM | POA: Insufficient documentation

## 2012-03-03 LAB — POCT I-STAT, CHEM 8
BUN: 6 mg/dL (ref 6–23)
Calcium, Ion: 1.18 mmol/L (ref 1.12–1.32)
Chloride: 107 mEq/L (ref 96–112)
Creatinine, Ser: 0.9 mg/dL (ref 0.50–1.10)
Glucose, Bld: 92 mg/dL (ref 70–99)
HCT: 38 % (ref 36.0–46.0)
Hemoglobin: 12.9 g/dL (ref 12.0–15.0)
Potassium: 4.3 mEq/L (ref 3.5–5.1)
Sodium: 139 mEq/L (ref 135–145)
TCO2: 22 mmol/L (ref 0–100)

## 2012-03-03 LAB — PREGNANCY, URINE: Preg Test, Ur: NEGATIVE

## 2012-03-03 LAB — WET PREP, GENITAL
Clue Cells Wet Prep HPF POC: NONE SEEN
Yeast Wet Prep HPF POC: NONE SEEN

## 2012-03-03 LAB — URINALYSIS, ROUTINE W REFLEX MICROSCOPIC
Bilirubin Urine: NEGATIVE
Glucose, UA: NEGATIVE mg/dL
Hgb urine dipstick: NEGATIVE
Ketones, ur: NEGATIVE mg/dL
Nitrite: NEGATIVE
Protein, ur: NEGATIVE mg/dL
Specific Gravity, Urine: 1.028 (ref 1.005–1.030)
Urobilinogen, UA: 1 mg/dL (ref 0.0–1.0)
pH: 6 (ref 5.0–8.0)

## 2012-03-03 LAB — URINE MICROSCOPIC-ADD ON

## 2012-03-03 MED ORDER — METRONIDAZOLE 500 MG PO TABS
2000.0000 mg | ORAL_TABLET | Freq: Once | ORAL | Status: AC
  Administered 2012-03-03: 2000 mg via ORAL
  Filled 2012-03-03: qty 4

## 2012-03-03 MED ORDER — CEFTRIAXONE SODIUM 250 MG IJ SOLR
250.0000 mg | Freq: Once | INTRAMUSCULAR | Status: AC
  Administered 2012-03-03: 250 mg via INTRAMUSCULAR
  Filled 2012-03-03: qty 250

## 2012-03-03 MED ORDER — AZITHROMYCIN 250 MG PO TABS
1000.0000 mg | ORAL_TABLET | Freq: Once | ORAL | Status: AC
  Administered 2012-03-03: 1000 mg via ORAL
  Filled 2012-03-03: qty 4

## 2012-03-03 MED ORDER — CEPHALEXIN 500 MG PO CAPS: 500.0000 mg | ORAL_CAPSULE | Freq: Four times a day (QID) | ORAL | Status: AC

## 2012-03-03 NOTE — ED Notes (Signed)
Keflex RX given to Pt

## 2012-03-03 NOTE — Discharge Instructions (Signed)
Your vaginal exam and lab work showed infection called trichomonas. You were treated for it today. You also have signs of other infections. Your cultures are pending and you will be notified in about 3 days if they return abnormal. Make sure you do not have intercourse for 1 week. Make sure your partner gets tested and treated. Take keflex for urinary tract infection until all gone. Follow up with your doctor for recheck in one week.   Cervicitis Cervicitis is a soreness and swelling (inflammation) of the cervix. Your cervix is located at the bottom of your uterus which opens up to the vagina.  CAUSES   Sexually transmitted infections (STIs).   Allergic reaction.   Medicines or birth control devices that are put in the vagina.   Injury to the cervix.   Bacterial infections.  SYMPTOMS  There may be no symptoms. If symptoms occur, they may include:  Grey, white, yellow, or bad smelling vaginal discharge.   Pain or itching of the area outside the vagina.   Painful sexual intercourse.   Lower abdominal or lower back pain, especially during intercourse.   Frequent urination.   Abnormal vaginal bleeding between periods, after sexual intercourse, or after menopause.   Pressure or a heavy feeling in the pelvis.  DIAGNOSIS  Diagnosis is made after a pelvic exam. Other tests may include:  Examination of any discharge under a microscope (wet prep).   A Pap test.  TREATMENT  Treatment will depend on the cause of cervicitis. If it is caused by an STI, both you and your partner will need to be treated. Antibiotic medicines will be given. HOME CARE INSTRUCTIONS   Do not have sexual intercourse until your caregiver says it is okay.   Do not have sexual intercourse until your partner has been treated if your cervicitis is caused by an STI.   Take your antibiotics as directed. Finish them even if you start to feel better.  SEEK IMMEDIATE MEDICAL CARE IF:   Your symptoms come back.    You have a fever.   You experience any problems that may be related to the medicine you are taking.  MAKE SURE YOU:   Understand these instructions.   Will watch your condition.   Will get help right away if you are not doing well or get worse.  Document Released: 11/21/2005 Document Revised: 11/10/2011 Document Reviewed: 06/20/2011 [facility] Patient Information 2012 [place], [place].  Trichomoniasis Trichomoniasis is an infection, caused by the Trichomonas organism, that affects both women and men. In women, the outer female genitalia and the vagina are affected. In men, the penis is mainly affected, but the prostate and other reproductive organs can also be involved. Trichomoniasis is a sexually transmitted disease (STD) and is most often passed to another person through sexual contact. The majority of people who get trichomoniasis do so from a sexual encounter and are also at risk for other STDs. CAUSES   Sexual intercourse with an infected partner.   It can be present in swimming pools or hot tubs.  SYMPTOMS   Abnormal gray-green frothy vaginal discharge in women.   Vaginal itching and irritation in women.   Itching and irritation of the area outside the vagina in women.   Penile discharge with or without pain in males.   Inflammation of the urethra (urethritis), causing painful urination.   Bleeding after sexual intercourse.  RELATED COMPLICATIONS  Pelvic inflammatory disease.   Infection of the uterus (endometritis).   Infertility.   Tubal (  ectopic) pregnancy.   It can be associated with other STDs, including gonorrhea and chlamydia, hepatitis B, and HIV.  COMPLICATIONS DURING PREGNANCY  Early (premature) delivery.   Premature rupture of the membranes (PROM).   Low birth weight.  DIAGNOSIS   Visualization of Trichomonas under the microscope from the vagina discharge.   Ph of the vagina greater than 4.5, tested with a test tape.   Trich Rapid Test.    Culture of the organism, but this is not usually needed.   It may be found on a Pap test.   Having a "strawberry cervix,"which means the cervix looks very red like a strawberry.  TREATMENT   You may be given medication to fight the infection. Inform your caregiver if you could be or are pregnant. Some medications used to treat the infection should not be taken during pregnancy.   Over-the-counter medications or creams to decrease itching or irritation may be recommended.   Your sexual partner will need to be treated if infected.  HOME CARE INSTRUCTIONS   Take all medication prescribed by your caregiver.   Take over-the-counter medication for itching or irritation as directed by your caregiver.   Do not have sexual intercourse while you have the infection.   Do not douche or wear tampons.   Discuss your infection with your partner, as your partner may have acquired the infection from you. Or, your partner may have been the person who transmitted the infection to you.   Have your sex partner examined and treated if necessary.   Practice safe, informed, and protected sex.   See your caregiver for other STD testing.  SEEK MEDICAL CARE IF:   You still have symptoms after you finish the medication.   You have an oral temperature above 102 F (38.9 C).   You develop belly (abdominal) pain.   You have pain when you urinate.   You have bleeding after sexual intercourse.   You develop a rash.   The medication makes you sick or makes you throw up (vomit).  Document Released: 05/17/2001 Document Revised: 11/10/2011 Document Reviewed: 06/12/2009 [facility] Patient Information 2012 [place], [place].

## 2012-03-03 NOTE — ED Notes (Signed)
Family at bedside. Pt update, still waiting for lab works

## 2012-03-03 NOTE — ED Provider Notes (Signed)
History     CSN: [id number]  Arrival date & time 03/03/12  1555   First MD Initiated Contact with Patient 03/03/12 1831      Chief Complaint  Patient presents with  . Vaginal Bleeding    (Consider location/radiation/quality/duration/timing/severity/associated sxs/prior treatment) Patient is a 36 y.o. female presenting with vaginal bleeding. The history is provided by the patient.  Vaginal Bleeding This is a new problem. The current episode started 1 to 4 weeks ago. The problem occurs intermittently. The problem has been unchanged. Associated symptoms include abdominal pain. Pertinent negatives include no nausea or vomiting.  Pt states for the last 3 months, she would have 3-4 periods a month. States she would bleed for 2-3 days, stop and and few days later starts again. Stats intermittent lower abdominal craping. States called her doctor and they didn't asnwer the phone so she decided today to come here to "get checked." She denies nausea, vomiting, dizziness, lightheadiness. States bleeding is about the same amount as her usual period. She is not on any blood thinners or OCPs. She does not think she is pregnant. No urinary symptoms.   History reviewed. No pertinent past medical history.  History reviewed. No pertinent past surgical history.  No family history on file.  History  Substance Use Topics  . Smoking status: Never Smoker   . Smokeless tobacco: Not on file  . Alcohol Use: No    OB History    Grav Para Term Preterm Abortions TAB SAB Ect Mult Living                  Review of Systems  Gastrointestinal: Positive for abdominal pain. Negative for nausea and vomiting.  Genitourinary: Positive for vaginal bleeding and pelvic pain. Negative for dysuria, hematuria, flank pain and vaginal discharge.  All other systems reviewed and are negative.    Allergies  Review of patient's allergies indicates no known allergies.  Home Medications   Current Outpatient Rx  Name  Route Sig Dispense Refill  . BC HEADACHE POWDER PO Oral Take 1 packet by mouth every 6 (six) hours as needed. For headache    . IBUPROFEN 200 MG PO TABS Oral Take 200 mg by mouth every 6 (six) hours as needed. For pain      BP 103/66  Pulse 111  Temp(Src) 98.8 F (37.1 C) (Oral)  Resp 20  Ht 5\' 4"  (1.626 m)  Wt 100 lb (45.36 kg)  BMI 17.17 kg/m2  SpO2 99%  LMP 03/02/2012  Physical Exam  Nursing note and vitals reviewed. Constitutional: She is oriented to person, place, and time. She appears well-developed and well-nourished.  HENT:  Head: Normocephalic and atraumatic.  Eyes: Conjunctivae are normal.  Neck: Neck supple.  Cardiovascular: Normal rate, regular rhythm and normal heart sounds.   Pulmonary/Chest: Effort normal and breath sounds normal. No respiratory distress. She has no wheezes. She has no rales.  Abdominal: Soft. Bowel sounds are normal. There is no tenderness.  Genitourinary:       Normal external genitalia. White frothy vaginal discharge. Cervix erythemous, friable. No CMT, no uterine tenderness, no bleeding  Musculoskeletal: Normal range of motion.  Neurological: She is alert and oriented to person, place, and time.  Skin: Skin is warm and dry.  Psychiatric: She has a normal mood and affect.    ED Course  Procedures (including critical care time)  Pt here with vaginal bleeding, however, no bleeding seen on exam. She does have vaginal discharge, cervix appears friable, red.  Will wait on wet prep.  Results for orders placed during the hospital encounter of 03/03/12  PREGNANCY, URINE      Component Value Range   Preg Test, Ur NEGATIVE  NEGATIVE   URINALYSIS, ROUTINE W REFLEX MICROSCOPIC      Component Value Range   Color, Urine YELLOW  YELLOW    APPearance CLEAR  CLEAR    Specific Gravity, Urine 1.028  1.005 - 1.030    pH 6.0  5.0 - 8.0    Glucose, UA NEGATIVE  NEGATIVE (mg/dL)   Hgb urine dipstick NEGATIVE  NEGATIVE    Bilirubin Urine NEGATIVE  NEGATIVE     Ketones, ur NEGATIVE  NEGATIVE (mg/dL)   Protein, ur NEGATIVE  NEGATIVE (mg/dL)   Urobilinogen, UA 1.0  0.0 - 1.0 (mg/dL)   Nitrite NEGATIVE  NEGATIVE    Leukocytes, UA MODERATE (*) NEGATIVE   WET PREP, GENITAL      Component Value Range   Yeast Wet Prep HPF POC NONE SEEN  NONE SEEN    Trich, Wet Prep FEW (*) NONE SEEN    Clue Cells Wet Prep HPF POC NONE SEEN  NONE SEEN    WBC, Wet Prep HPF POC MANY (*) NONE SEEN   URINE MICROSCOPIC-ADD ON      Component Value Range   Squamous Epithelial / LPF RARE  RARE    WBC, UA 7-10  <3 (WBC/hpf)   Urine-Other MUCOUS PRESENT    POCT I-STAT, CHEM 8      Component Value Range   Sodium 139  135 - 145 (mEq/L)   Potassium 4.3  3.5 - 5.1 (mEq/L)   Chloride 107  96 - 112 (mEq/L)   BUN 6  6 - 23 (mg/dL)   Creatinine, Ser 1.61  0.50 - 1.10 (mg/dL)   Glucose, Bld 92  70 - 99 (mg/dL)   Calcium, Ion 0.96  0.45 - 1.32 (mmol/L)   TCO2 22  0 - 100 (mmol/L)   Hemoglobin 12.9  12.0 - 15.0 (g/dL)   HCT 40.9  81.1 - 91.4 (%)   Many WBCs on wet prep, trichomonas. Will treat for cervicitis. Pt has no fever, no abdominal tenderness, not any major CMT on exam. Doubt PID. Pt given rocephin, zithromax, flagyl in ED. Will also treat for possible UTI. Results and plan discussed with pt.    No diagnosis found.    MDM          Lottie Mussel, PA 03/04/12 [phone]

## 2012-03-03 NOTE — ED Notes (Signed)
Intermittent vaginal bleeding and cramping  for over 3 months.

## 2012-03-05 LAB — GC/CHLAMYDIA PROBE AMP, GENITAL
Chlamydia, DNA Probe: NEGATIVE
GC Probe Amp, Genital: NEGATIVE

## 2012-03-11 NOTE — ED Provider Notes (Signed)
Evaluation and management procedures were performed by the PA/NP/Resident Physician under my supervision/collaboration.   Jediah Horger D Jenina Moening, MD 03/11/12 1556 

## 2012-09-23 ENCOUNTER — Emergency Department (HOSPITAL_COMMUNITY)
Admission: EM | Admit: 2012-09-23 | Discharge: 2012-09-23 | Disposition: A | Discharge status: Home / Self Care | Payer: Medicaid Other | Attending: Emergency Medicine | Admitting: Emergency Medicine

## 2012-09-23 ENCOUNTER — Encounter (HOSPITAL_COMMUNITY): Payer: Self-pay | Admitting: Emergency Medicine

## 2012-09-23 ENCOUNTER — Emergency Department (HOSPITAL_COMMUNITY): Payer: Medicaid Other

## 2012-09-23 DIAGNOSIS — R071 Chest pain on breathing: Secondary | ICD-10-CM | POA: Insufficient documentation

## 2012-09-23 DIAGNOSIS — R079 Chest pain, unspecified: Secondary | ICD-10-CM | POA: Insufficient documentation

## 2012-09-23 DIAGNOSIS — R0789 Other chest pain: Secondary | ICD-10-CM

## 2012-09-23 LAB — CBC WITH DIFFERENTIAL/PLATELET
Basophils Absolute: 0 10*3/uL (ref 0.0–0.1)
Basophils Relative: 0 % (ref 0–1)
Eosinophils Absolute: 0.1 10*3/uL (ref 0.0–0.7)
Eosinophils Relative: 3 % (ref 0–5)
HCT: 36.2 % (ref 36.0–46.0)
Hemoglobin: 12.1 g/dL (ref 12.0–15.0)
Lymphocytes Relative: 36 % (ref 12–46)
Lymphs Abs: 1.2 10*3/uL (ref 0.7–4.0)
MCH: 31.1 pg (ref 26.0–34.0)
MCHC: 33.4 g/dL (ref 30.0–36.0)
MCV: 93.1 fL (ref 78.0–100.0)
Monocytes Absolute: 0.3 10*3/uL (ref 0.1–1.0)
Monocytes Relative: 8 % (ref 3–12)
Neutro Abs: 1.7 10*3/uL (ref 1.7–7.7)
Neutrophils Relative %: 53 % (ref 43–77)
Platelets: 209 10*3/uL (ref 150–400)
RBC: 3.89 MIL/uL (ref 3.87–5.11)
RDW: 11.7 % (ref 11.5–15.5)
WBC: 3.2 10*3/uL — ABNORMAL LOW (ref 4.0–10.5)

## 2012-09-23 LAB — BASIC METABOLIC PANEL
BUN: 6 mg/dL (ref 6–23)
CO2: 28 mEq/L (ref 19–32)
Calcium: 9.3 mg/dL (ref 8.4–10.5)
Chloride: 102 mEq/L (ref 96–112)
Creatinine, Ser: 0.63 mg/dL (ref 0.50–1.10)
GFR calc Af Amer: >90 mL/min (ref >90)
GFR calc non Af Amer: >90 mL/min (ref >90)
Glucose, Bld: 84 mg/dL (ref 70–99)
Potassium: 4.4 mEq/L (ref 3.5–5.1)
Sodium: 138 mEq/L (ref 135–145)

## 2012-09-23 LAB — POCT I-STAT TROPONIN I
Troponin i, poc: 0.01 ng/mL (ref 0.00–0.08)
Troponin i, poc: 0.02 ng/mL (ref 0.00–0.08)

## 2012-09-23 MED ORDER — OXYCODONE-ACETAMINOPHEN 5-325 MG PO TABS: 1.0000 | ORAL_TABLET | Freq: Four times a day (QID) | ORAL | Status: DC | PRN

## 2012-09-23 MED ORDER — IBUPROFEN 200 MG PO TABS
600.0000 mg | ORAL_TABLET | Freq: Once | ORAL | Status: AC
  Administered 2012-09-23: 600 mg via ORAL
  Filled 2012-09-23: qty 3

## 2012-09-23 MED ORDER — IBUPROFEN 600 MG PO TABS: 600.0000 mg | ORAL_TABLET | Freq: Four times a day (QID) | ORAL | Status: DC | PRN

## 2012-09-23 MED ORDER — CYCLOBENZAPRINE HCL 10 MG PO TABS: 10.0000 mg | ORAL_TABLET | Freq: Two times a day (BID) | ORAL | Status: DC | PRN

## 2012-09-23 MED ORDER — OXYCODONE-ACETAMINOPHEN 5-325 MG PO TABS
1.0000 | ORAL_TABLET | Freq: Once | ORAL | Status: AC
  Administered 2012-09-23: 1 via ORAL
  Filled 2012-09-23: qty 1

## 2012-09-23 NOTE — ED Notes (Signed)
Pt c/o left upper chest pain onset last night while lying in bed. Pt denies any other symptoms at present.

## 2012-09-23 NOTE — ED Notes (Signed)
Pt c/o of intermittent CP, first started last night when she was laying down. Pain is located on right side above chest. Pt reports the pain only lasts for a few seconds. Pt denies CP now.

## 2012-09-23 NOTE — ED Notes (Signed)
Pt denies CP

## 2012-09-23 NOTE — ED Provider Notes (Addendum)
History     CSN: [id number]  Arrival date & time 09/23/12  1015   First MD Initiated Contact with Patient 09/23/12 1326      Chief Complaint  Patient presents with  . Chest Pain    (Consider location/radiation/quality/duration/timing/severity/associated sxs/prior treatment) HPI Comments: Brianna Velez presents for evaluation of anterior chest pain that was exacerbated while lifting heavy boxes.  She reports the pain changes as she changes the position of her torso.  She denies fever, cough, SOB, palpitations, syncope, leg pain, or swelling.  She denies recent travel or immobilization.  Patient is a 36 y.o. female presenting with chest pain. The history is provided by the patient. No language interpreter was used.  Chest Pain The chest pain began 1 - 2 hours ago. Chest pain occurs constantly. The pain is associated with lifting and exertion. At its most intense, the pain is at 8/10. The pain is currently at 8/10. The severity of the pain is severe. The quality of the pain is described as tightness and sharp. The pain does not radiate. Chest pain is worsened by certain positions and exertion. Pertinent negatives for primary symptoms include no fever, no fatigue, no syncope, no shortness of breath, no cough, no wheezing, no palpitations, no abdominal pain, no nausea, no vomiting, no dizziness and no altered mental status.  Pertinent negatives for associated symptoms include no claudication, no diaphoresis, no lower extremity edema, no near-syncope, no numbness, no orthopnea, no paroxysmal nocturnal dyspnea and no weakness. She tried nothing for the symptoms. Risk factors include no known risk factors. Past medical history comments: Denies any significant  Her family medical history is significant for diabetes in family, hyperlipidemia in family and hypertension in family.  Pertinent negatives for family medical history include: no CAD in family (She denies any family history of early coronary  artery disease or thromboembolic events.).     History reviewed. No pertinent past medical history.  Past Surgical History  Procedure Date  . Tubal ligation     No family history on file.  History  Substance Use Topics  . Smoking status: Never Smoker   . Smokeless tobacco: Not on file  . Alcohol Use: No    OB History    Grav Para Term Preterm Abortions TAB SAB Ect Mult Living                  Review of Systems  Constitutional: Negative for fever, chills, diaphoresis, activity change, appetite change and fatigue.  HENT: Negative for congestion, sore throat, facial swelling, rhinorrhea, trouble swallowing, neck pain and neck stiffness.   Eyes: Negative.   Respiratory: Negative for cough, chest tightness, shortness of breath and wheezing.   Cardiovascular: Positive for chest pain. Negative for palpitations, orthopnea, claudication, leg swelling, syncope and near-syncope.  Gastrointestinal: Negative for nausea, vomiting, abdominal pain, diarrhea, constipation and abdominal distention.  Genitourinary: Negative for urgency, flank pain and difficulty urinating.  Musculoskeletal: Negative for myalgias, back pain, arthralgias and gait problem.  Skin: Negative for color change, pallor, rash and wound.  Neurological: Negative.  Negative for dizziness, weakness and numbness.  Psychiatric/Behavioral: Negative for altered mental status.       She reports feeling stressed secondary to issues involving her 68 year old daughter. 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    Allergies  Review of patient's allergies indicates no known allergies.  Home Medications   Current Outpatient Rx  Name Route Sig Dispense Refill  . TRAMADOL HCL 50 MG  PO TABS Oral Take 50 mg by mouth every 6 (six) hours as needed. For pain      BP 115/85  Pulse 84  Temp 97.9 F (36.6 C) (Oral)  Resp 16  SpO2 99%  LMP 08/20/2012  Physical Exam  Nursing note and vitals  reviewed. Constitutional: She is oriented to person, place, and time. She appears well-developed and well-nourished. No distress.  HENT:  Head: Normocephalic and atraumatic.  Right Ear: External ear normal.  Left Ear: External ear normal.  Nose: Nose normal.  Mouth/Throat: Oropharynx is clear and moist. No oropharyngeal exudate.  Eyes: Conjunctivae normal are normal. Pupils are equal, round, and reactive to light. Right eye exhibits no discharge. Left eye exhibits no discharge. No scleral icterus.  Neck: Normal range of motion. Neck supple. No JVD present. No tracheal deviation present.  Cardiovascular: Normal rate, regular rhythm, normal heart sounds and intact distal pulses.  Exam reveals no gallop and no friction rub.   No murmur heard. Pulmonary/Chest: Effort normal and breath sounds normal. No stridor. No respiratory distress. She has no decreased breath sounds. She has no wheezes. She has no rhonchi. She has no rales. She exhibits tenderness and bony tenderness. She exhibits no laceration and no crepitus.  Abdominal: Soft. Bowel sounds are normal. She exhibits no distension and no mass. There is no tenderness. There is no rebound and no guarding.  Musculoskeletal: Normal range of motion. She exhibits tenderness. She exhibits no edema.  Lymphadenopathy:    She has no cervical adenopathy.  Neurological: She is alert and oriented to person, place, and time.  Skin: Skin is warm and dry. No rash noted. She is not diaphoretic. No erythema. No pallor.  Psychiatric: She has a normal mood and affect. Her behavior is normal.    ED Course  Procedures (including critical care time)  Labs Reviewed  CBC WITH DIFFERENTIAL - Abnormal; Notable for the following:    WBC 3.2 (*)     All other components within normal limits  BASIC METABOLIC PANEL  POCT I-STAT TROPONIN I   Dg Chest 2 View  09/23/2012  *RADIOLOGY REPORT*  Clinical Data: Chest pain  CHEST - 2 VIEW  Comparison: 03/10/2011  Findings:  Cardiomediastinal silhouette is stable.  No acute infiltrate or pleural effusion.  No pulmonary edema.  Bony thorax is stable.  IMPRESSION: No active disease.   Original Report Authenticated By: Natasha Mead, M.D.      No diagnosis found.   Date: 09/23/2012  Rate: 77 bpm  Rhythm: normal sinus rhythm  QRS Axis: right  Intervals: normal  ST/T Wave abnormalities: normal  Conduction Disutrbances:none  Narrative Interpretation:   Old EKG Reviewed: only change is the axis      MDM  Pt presents for evaluation of chest pain that started as she was lifting a box.  She denies any trauma but has easily reproducible pain on exam.  She denies risk factors for early atherosclerotic or thromboembolic disease.  Plan routine screening including basic labs, EKG, and CXR.  Will review results as available and provide the appropriate disposition.  1630.  Pt is stable, NAD.  Trop negative x2.  CXR negative also.  Plan pain mgmnt.  Encouraged outpt f/u.       Tobin Chad, MD 09/23/12 1610  Tobin Chad, MD 09/23/12 9604  Tobin Chad, MD 09/23/12 [id number]

## 2013-01-26 ENCOUNTER — Encounter (HOSPITAL_COMMUNITY): Payer: Self-pay | Admitting: Emergency Medicine

## 2013-01-26 DIAGNOSIS — R112 Nausea with vomiting, unspecified: Secondary | ICD-10-CM | POA: Insufficient documentation

## 2013-01-26 DIAGNOSIS — Z3202 Encounter for pregnancy test, result negative: Secondary | ICD-10-CM | POA: Insufficient documentation

## 2013-01-26 DIAGNOSIS — G43909 Migraine, unspecified, not intractable, without status migrainosus: Secondary | ICD-10-CM | POA: Insufficient documentation

## 2013-01-26 LAB — URINALYSIS, ROUTINE W REFLEX MICROSCOPIC
Bilirubin Urine: NEGATIVE
Glucose, UA: NEGATIVE mg/dL
Ketones, ur: >80 mg/dL — AB
Leukocytes, UA: NEGATIVE
Nitrite: NEGATIVE
Protein, ur: NEGATIVE mg/dL
Specific Gravity, Urine: 1.026 (ref 1.005–1.030)
Urobilinogen, UA: 0.2 mg/dL (ref 0.0–1.0)
pH: 6 (ref 5.0–8.0)

## 2013-01-26 LAB — URINE MICROSCOPIC-ADD ON

## 2013-01-26 LAB — POCT PREGNANCY, URINE: Preg Test, Ur: NEGATIVE

## 2013-01-26 NOTE — ED Notes (Signed)
Pt c/o migraine headache onset this am.  Has taken OTC meds without relief.  Pt also c/o nausea with vomiting x's 1.

## 2013-01-27 ENCOUNTER — Emergency Department (HOSPITAL_COMMUNITY)
Admission: EM | Admit: 2013-01-27 | Discharge: 2013-01-27 | Discharge status: Against Medical Advice | Payer: Medicaid Other | Attending: Emergency Medicine | Admitting: Emergency Medicine

## 2013-01-27 HISTORY — DX: Migraine, unspecified, not intractable, without status migrainosus: G43.909

## 2013-01-27 NOTE — ED Notes (Signed)
Called for pt.  No response.

## 2013-01-27 NOTE — ED Notes (Signed)
Pt seen leaving ED 

## 2013-02-06 ENCOUNTER — Other Ambulatory Visit: Payer: Self-pay | Admitting: Family Medicine

## 2013-02-06 DIAGNOSIS — N63 Unspecified lump in unspecified breast: Secondary | ICD-10-CM

## 2013-02-07 ENCOUNTER — Other Ambulatory Visit: Payer: Self-pay | Admitting: Family Medicine

## 2013-02-07 ENCOUNTER — Ambulatory Visit
Admission: RE | Admit: 2013-02-07 | Discharge: 2013-02-07 | Disposition: A | Discharge status: Home / Self Care | Payer: Medicaid Other | Source: Ambulatory Visit | Attending: Family Medicine | Admitting: Family Medicine

## 2013-02-07 DIAGNOSIS — N63 Unspecified lump in unspecified breast: Secondary | ICD-10-CM

## 2013-02-18 ENCOUNTER — Ambulatory Visit
Admission: RE | Admit: 2013-02-18 | Discharge: 2013-02-18 | Disposition: A | Discharge status: Home / Self Care | Payer: Medicaid Other | Source: Ambulatory Visit | Attending: Family Medicine | Admitting: Family Medicine

## 2013-02-18 ENCOUNTER — Other Ambulatory Visit: Payer: Self-pay | Admitting: Family Medicine

## 2013-02-18 DIAGNOSIS — N63 Unspecified lump in unspecified breast: Secondary | ICD-10-CM

## 2013-02-26 ENCOUNTER — Other Ambulatory Visit: Payer: Medicaid Other

## 2013-03-04 ENCOUNTER — Other Ambulatory Visit: Payer: Medicaid Other

## 2013-03-11 ENCOUNTER — Other Ambulatory Visit: Payer: Self-pay | Admitting: Family Medicine

## 2013-03-11 ENCOUNTER — Ambulatory Visit
Admission: RE | Admit: 2013-03-11 | Discharge: 2013-03-11 | Disposition: A | Discharge status: Home / Self Care | Payer: Medicaid Other | Source: Ambulatory Visit | Attending: Family Medicine | Admitting: Family Medicine

## 2013-03-11 DIAGNOSIS — N63 Unspecified lump in unspecified breast: Secondary | ICD-10-CM

## 2013-05-17 ENCOUNTER — Ambulatory Visit
Admission: RE | Admit: 2013-05-17 | Discharge: 2013-05-17 | Disposition: A | Discharge status: Home / Self Care | Payer: Medicaid Other | Source: Ambulatory Visit | Attending: Family Medicine | Admitting: Family Medicine

## 2013-05-17 ENCOUNTER — Other Ambulatory Visit: Payer: Self-pay | Admitting: Family Medicine

## 2013-05-17 DIAGNOSIS — M25552 Pain in left hip: Secondary | ICD-10-CM

## 2013-05-17 DIAGNOSIS — M545 Low back pain, unspecified: Secondary | ICD-10-CM

## 2013-06-11 ENCOUNTER — Encounter (HOSPITAL_BASED_OUTPATIENT_CLINIC_OR_DEPARTMENT_OTHER): Payer: Self-pay | Admitting: *Deleted

## 2013-06-11 ENCOUNTER — Emergency Department (HOSPITAL_BASED_OUTPATIENT_CLINIC_OR_DEPARTMENT_OTHER)
Admission: EM | Admit: 2013-06-11 | Discharge: 2013-06-11 | Discharge status: Against Medical Advice | Payer: Medicaid Other | Attending: Emergency Medicine | Admitting: Emergency Medicine

## 2013-06-11 DIAGNOSIS — Z3202 Encounter for pregnancy test, result negative: Secondary | ICD-10-CM | POA: Insufficient documentation

## 2013-06-11 DIAGNOSIS — Z113 Encounter for screening for infections with a predominantly sexual mode of transmission: Secondary | ICD-10-CM | POA: Insufficient documentation

## 2013-06-11 DIAGNOSIS — Z8679 Personal history of other diseases of the circulatory system: Secondary | ICD-10-CM | POA: Insufficient documentation

## 2013-06-11 LAB — URINALYSIS, ROUTINE W REFLEX MICROSCOPIC
Bilirubin Urine: NEGATIVE
Glucose, UA: NEGATIVE mg/dL
Ketones, ur: NEGATIVE mg/dL
Leukocytes, UA: NEGATIVE
Nitrite: NEGATIVE
Protein, ur: NEGATIVE mg/dL
Specific Gravity, Urine: 1.024 (ref 1.005–1.030)
Urobilinogen, UA: 4 mg/dL — ABNORMAL HIGH (ref 0.0–1.0)
pH: 6 (ref 5.0–8.0)

## 2013-06-11 LAB — URINE MICROSCOPIC-ADD ON

## 2013-06-11 LAB — PREGNANCY, URINE: Preg Test, Ur: NEGATIVE

## 2013-06-11 NOTE — ED Notes (Signed)
Wants to be checked for STD. No symptoms. Partner was checked and every test was negative.

## 2013-06-11 NOTE — ED Notes (Signed)
MD at bedside. 

## 2013-06-11 NOTE — ED Notes (Signed)
Pt seen by registration leaving through the front door. Gown found on bed.

## 2013-06-12 LAB — GC/CHLAMYDIA PROBE AMP
CT Probe RNA: NEGATIVE
GC Probe RNA: NEGATIVE

## 2013-06-12 LAB — RPR: RPR Ser Ql: NONREACTIVE

## 2013-06-12 LAB — HIV ANTIBODY (ROUTINE TESTING W REFLEX): HIV: NONREACTIVE

## 2013-06-12 NOTE — ED Provider Notes (Signed)
History    CSN: [id number] Arrival date & time 06/11/13  2006  First MD Initiated Contact with Patient 06/11/13 2040     No chief complaint on file.  (Consider location/radiation/quality/duration/timing/severity/associated sxs/prior Treatment) HPI Patient presents with request for repeating checked for STDs. She denies any abdominal pain or vaginal discharge. She is currently having her menstrual cycle. She states that her partner was recently checked and she would like to be checked as well. She has no known exposure to any STD. She denies any fever or dysuria.  There are no other associated systemic symptoms, there are no other alleviating or modifying factors.  Past Medical History  Diagnosis Date  . Migraine    Past Surgical History  Procedure Laterality Date  . Tubal ligation     No family history on file. History  Substance Use Topics  . Smoking status: Never Smoker   . Smokeless tobacco: Not on file  . Alcohol Use: No   OB History   Grav Para Term Preterm Abortions TAB SAB Ect Mult Living                 Review of Systems ROS reviewed and all otherwise negative except for mentioned in HPI  Allergies  Review of patient's allergies indicates no known allergies.  Home Medications   Current Outpatient Rx  Name  Route  Sig  Dispense  Refill  . cyclobenzaprine (FLEXERIL) 10 MG tablet   Oral   Take 1 tablet (10 mg total) by mouth 2 (two) times daily as needed for muscle spasms.   20 tablet   0   . ibuprofen (ADVIL,MOTRIN) 600 MG tablet   Oral   Take 1 tablet (600 mg total) by mouth every 6 (six) hours as needed for pain.   28 tablet   0   . oxyCODONE-acetaminophen (PERCOCET) 5-325 MG per tablet   Oral   Take 1 tablet by mouth every 6 (six) hours as needed for pain.   11 tablet   0   . traMADol (ULTRAM) 50 MG tablet   Oral   Take 50 mg by mouth every 6 (six) hours as needed. For pain          BP 121/72  Pulse 85  Temp(Src) 98.5 F (36.9 C) (Oral)   Resp 20  Wt 106 lb (48.081 kg)  BMI 18.19 kg/m2  SpO2 100%  LMP 06/07/2013 Vitals reviewed Physical Exam Physical Examination: General appearance - alert, well appearing, and in no distress Mental status - alert, oriented to person, place, and time Eyes - no conjunctival injection, no scleral icterus Mouth - mucous membranes moist, pharynx normal without lesions Chest - clear to auscultation, no wheezes, rales or rhonchi, symmetric air entry Heart - normal rate, regular rhythm, normal S1, S2, no murmurs, rubs, clicks or gallops Abdomen - soft, nontender, nondistended, no masses or organomegaly Pelvic - normal external genitalia, vulva, vagina, cervix, uterus and adnexa, moderate blood in vaginal vault Extremities - peripheral pulses normal, no pedal edema, no clubbing or cyanosis Skin - normal coloration and turgor, no rashes  ED Course  Procedures (including critical care time) Labs Reviewed  URINALYSIS, ROUTINE W REFLEX MICROSCOPIC - Abnormal; Notable for the following:    Hgb urine dipstick LARGE (*)    Urobilinogen, UA 4.0 (*)    All other components within normal limits  GC/CHLAMYDIA PROBE AMP  PREGNANCY, URINE  URINE MICROSCOPIC-ADD ON  RPR  HIV ANTIBODY (ROUTINE TESTING)   No results found.  1. Screen for STD (sexually transmitted disease)     MDM  Patient presenting with request for STD check. For STD panel sent. She has no current symptoms. She was advised that results will be available in approximately 2 days. She was also advised to seek STD checks at the health department.  Discharged with strict return precautions.  Pt agreeable with plan.  Pt left prior to receiving her discharge instructions.   Ethelda Chick, MD 06/12/13 [phone]

## 2013-06-25 ENCOUNTER — Telehealth (HOSPITAL_COMMUNITY): Payer: Self-pay | Admitting: Emergency Medicine

## 2013-06-25 NOTE — ED Notes (Signed)
Pt called for STD results from 7/8.  ID verified x 2.  Pt informed results were all negative or non-reactive.

## 2013-09-03 ENCOUNTER — Emergency Department (HOSPITAL_COMMUNITY)
Admission: EM | Admit: 2013-09-03 | Discharge: 2013-09-03 | Disposition: A | Discharge status: Home / Self Care | Payer: Medicaid Other | Attending: Emergency Medicine | Admitting: Emergency Medicine

## 2013-09-03 ENCOUNTER — Encounter (HOSPITAL_COMMUNITY): Payer: Self-pay | Admitting: Emergency Medicine

## 2013-09-03 DIAGNOSIS — IMO0002 Reserved for concepts with insufficient information to code with codable children: Secondary | ICD-10-CM | POA: Insufficient documentation

## 2013-09-03 DIAGNOSIS — M792 Neuralgia and neuritis, unspecified: Secondary | ICD-10-CM

## 2013-09-03 DIAGNOSIS — G8929 Other chronic pain: Secondary | ICD-10-CM | POA: Insufficient documentation

## 2013-09-03 DIAGNOSIS — Z8679 Personal history of other diseases of the circulatory system: Secondary | ICD-10-CM | POA: Insufficient documentation

## 2013-09-03 MED ORDER — TRAMADOL HCL 50 MG PO TABS
50.0000 mg | ORAL_TABLET | Freq: Once | ORAL | Status: AC
  Administered 2013-09-03: 50 mg via ORAL
  Filled 2013-09-03: qty 1

## 2013-09-03 MED ORDER — TRAMADOL HCL 50 MG PO TABS: 50.0000 mg | ORAL_TABLET | Freq: Four times a day (QID) | ORAL | Status: DC | PRN

## 2013-09-03 NOTE — ED Notes (Signed)
Pt states that she has been dealing with leg pain for the past 8 yrs now and was seen she said it was a nerve problem and did not do anything, no injury, pt ambulatory.
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
## 2013-09-05 NOTE — ED Provider Notes (Signed)
Medical screening examination/treatment/procedure(s) were performed by non-physician practitioner and as supervising physician I was immediately available for consultation/collaboration.     Celene Kras, MD 09/05/13 0000

## 2013-09-11 ENCOUNTER — Encounter (HOSPITAL_COMMUNITY): Payer: Self-pay | Admitting: Emergency Medicine

## 2013-09-11 ENCOUNTER — Emergency Department (HOSPITAL_COMMUNITY)
Admission: EM | Admit: 2013-09-11 | Discharge: 2013-09-11 | Disposition: A | Discharge status: Home / Self Care | Payer: Medicaid Other | Attending: Emergency Medicine | Admitting: Emergency Medicine

## 2013-09-11 DIAGNOSIS — Z8669 Personal history of other diseases of the nervous system and sense organs: Secondary | ICD-10-CM | POA: Insufficient documentation

## 2013-09-11 DIAGNOSIS — M5430 Sciatica, unspecified side: Secondary | ICD-10-CM | POA: Insufficient documentation

## 2013-09-11 DIAGNOSIS — IMO0001 Reserved for inherently not codable concepts without codable children: Secondary | ICD-10-CM | POA: Insufficient documentation

## 2013-09-11 DIAGNOSIS — M5432 Sciatica, left side: Secondary | ICD-10-CM

## 2013-09-11 MED ORDER — TRAMADOL HCL 50 MG PO TABS
50.0000 mg | ORAL_TABLET | Freq: Once | ORAL | Status: AC
  Administered 2013-09-11: 50 mg via ORAL
  Filled 2013-09-11: qty 1

## 2013-09-11 MED ORDER — TRAMADOL HCL 50 MG PO TABS: 50.0000 mg | ORAL_TABLET | Freq: Four times a day (QID) | ORAL | Status: DC | PRN

## 2013-09-11 NOTE — Discharge Instructions (Signed)
Please follow up with a Primary Care provider at [facility] for continued pain.
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
## 2013-09-11 NOTE — ED Provider Notes (Signed)
Medical screening examination/treatment/procedure(s) were performed by non-physician practitioner and as supervising physician I was immediately available for consultation/collaboration.  Courtney F Horton, MD 09/11/13 2016 

## 2013-09-11 NOTE — ED Notes (Signed)
Pt wanting to wait a few minutes for pain to decrease a little more.

## 2013-09-19 ENCOUNTER — Emergency Department (HOSPITAL_COMMUNITY)
Admission: EM | Admit: 2013-09-19 | Discharge: 2013-09-19 | Disposition: A | Discharge status: Home / Self Care | Payer: Medicaid Other | Attending: Emergency Medicine | Admitting: Emergency Medicine

## 2013-09-19 ENCOUNTER — Encounter (HOSPITAL_COMMUNITY): Payer: Self-pay | Admitting: Emergency Medicine

## 2013-09-19 DIAGNOSIS — G8921 Chronic pain due to trauma: Secondary | ICD-10-CM | POA: Insufficient documentation

## 2013-09-19 DIAGNOSIS — M79605 Pain in left leg: Secondary | ICD-10-CM

## 2013-09-19 DIAGNOSIS — M25559 Pain in unspecified hip: Secondary | ICD-10-CM | POA: Insufficient documentation

## 2013-09-19 DIAGNOSIS — Z79899 Other long term (current) drug therapy: Secondary | ICD-10-CM | POA: Insufficient documentation

## 2013-09-19 DIAGNOSIS — Z8679 Personal history of other diseases of the circulatory system: Secondary | ICD-10-CM | POA: Insufficient documentation

## 2013-09-19 HISTORY — DX: Pain in leg, unspecified: M79.606

## 2013-09-19 MED ORDER — NAPROXEN 500 MG PO TABS
500.0000 mg | ORAL_TABLET | Freq: Once | ORAL | Status: AC
  Administered 2013-09-19: 500 mg via ORAL
  Filled 2013-09-19: qty 1
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
## 2013-10-24 ENCOUNTER — Ambulatory Visit: Payer: Medicaid Other | Attending: Internal Medicine | Admitting: Internal Medicine

## 2013-10-24 ENCOUNTER — Encounter: Payer: Self-pay | Admitting: Internal Medicine

## 2013-10-24 VITALS — BP 105/68 | HR 103 | Temp 98.5°F | Resp 16 | Ht 62.0 in | Wt 109.0 lb

## 2013-10-24 DIAGNOSIS — M25552 Pain in left hip: Secondary | ICD-10-CM

## 2013-10-24 MED ORDER — PREGABALIN 75 MG PO CAPS: 75.0000 mg | ORAL_CAPSULE | Freq: Two times a day (BID) | ORAL | Status: DC

## 2013-10-24 MED ORDER — MELOXICAM 7.5 MG PO TABS: 7.5000 mg | ORAL_TABLET | Freq: Every day | ORAL | Status: DC
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
## 2013-10-25 ENCOUNTER — Telehealth: Payer: Self-pay | Admitting: Internal Medicine

## 2013-10-25 ENCOUNTER — Telehealth: Payer: Self-pay

## 2013-10-25 NOTE — Telephone Encounter (Signed)
Patient called office Needed prior auth on mobic and lyrica The mobic can be generic and will  Be covered They lyrica medicaid is trying to get authorized  Will take 24 hours to be viewed Authorization # [id number] P

## 2013-10-25 NOTE — Telephone Encounter (Signed)
Pt says she need MCD approval for meds prescribed on 10/24/13.  Pt uses [facility] [address]

## 2013-11-05 ENCOUNTER — Ambulatory Visit (HOSPITAL_COMMUNITY)
Admission: RE | Admit: 2013-11-05 | Discharge: 2013-11-05 | Disposition: A | Discharge status: Home / Self Care | Payer: Medicaid Other | Source: Ambulatory Visit | Attending: Internal Medicine | Admitting: Internal Medicine

## 2013-11-05 DIAGNOSIS — M25552 Pain in left hip: Secondary | ICD-10-CM

## 2013-11-05 DIAGNOSIS — D251 Intramural leiomyoma of uterus: Secondary | ICD-10-CM | POA: Insufficient documentation

## 2013-11-05 DIAGNOSIS — M25559 Pain in unspecified hip: Secondary | ICD-10-CM | POA: Insufficient documentation

## 2013-11-15 ENCOUNTER — Telehealth: Payer: Self-pay | Admitting: Internal Medicine

## 2013-11-15 DIAGNOSIS — D259 Leiomyoma of uterus, unspecified: Secondary | ICD-10-CM

## 2013-11-15 NOTE — Telephone Encounter (Signed)
Patient is aware of her xray results and MRI Will put referral in for ob-gyn

## 2013-11-15 NOTE — Telephone Encounter (Signed)
Pt calling about results for x-ray and MRI.  Please f/u with pt.

## 2013-12-02 ENCOUNTER — Ambulatory Visit: Payer: Medicaid Other | Admitting: Internal Medicine

## 2013-12-04 ENCOUNTER — Telehealth: Payer: Self-pay

## 2013-12-28 ENCOUNTER — Encounter (HOSPITAL_COMMUNITY): Payer: Self-pay | Admitting: Emergency Medicine

## 2013-12-28 ENCOUNTER — Emergency Department (HOSPITAL_COMMUNITY)
Admission: EM | Admit: 2013-12-28 | Discharge: 2013-12-28 | Disposition: A | Discharge status: Home / Self Care | Payer: Medicaid Other | Attending: Emergency Medicine | Admitting: Emergency Medicine

## 2013-12-28 DIAGNOSIS — Z3202 Encounter for pregnancy test, result negative: Secondary | ICD-10-CM | POA: Insufficient documentation

## 2013-12-28 DIAGNOSIS — D259 Leiomyoma of uterus, unspecified: Secondary | ICD-10-CM

## 2013-12-28 DIAGNOSIS — A499 Bacterial infection, unspecified: Secondary | ICD-10-CM | POA: Insufficient documentation

## 2013-12-28 DIAGNOSIS — N760 Acute vaginitis: Secondary | ICD-10-CM | POA: Insufficient documentation

## 2013-12-28 DIAGNOSIS — Z79899 Other long term (current) drug therapy: Secondary | ICD-10-CM | POA: Insufficient documentation

## 2013-12-28 DIAGNOSIS — B9689 Other specified bacterial agents as the cause of diseases classified elsewhere: Secondary | ICD-10-CM | POA: Insufficient documentation

## 2013-12-28 DIAGNOSIS — G43909 Migraine, unspecified, not intractable, without status migrainosus: Secondary | ICD-10-CM | POA: Insufficient documentation

## 2013-12-28 DIAGNOSIS — R102 Pelvic and perineal pain: Secondary | ICD-10-CM

## 2013-12-28 LAB — URINALYSIS W MICROSCOPIC + REFLEX CULTURE
Bilirubin Urine: NEGATIVE
Glucose, UA: NEGATIVE mg/dL
Hgb urine dipstick: NEGATIVE
Ketones, ur: NEGATIVE mg/dL
Leukocytes, UA: NEGATIVE
Nitrite: NEGATIVE
Protein, ur: NEGATIVE mg/dL
Specific Gravity, Urine: 1.014 (ref 1.005–1.030)
Urobilinogen, UA: 0.2 mg/dL (ref 0.0–1.0)
pH: 7.5 (ref 5.0–8.0)

## 2013-12-28 LAB — WET PREP, GENITAL
Trich, Wet Prep: NONE SEEN
Yeast Wet Prep HPF POC: NONE SEEN

## 2013-12-28 LAB — POCT PREGNANCY, URINE: Preg Test, Ur: NEGATIVE

## 2013-12-28 MED ORDER — METRONIDAZOLE 500 MG PO TABS: 500.0000 mg | ORAL_TABLET | Freq: Two times a day (BID) | ORAL | Status: DC

## 2013-12-28 MED ORDER — HYDROCODONE-ACETAMINOPHEN 5-325 MG PO TABS: 1.0000 | ORAL_TABLET | ORAL | Status: DC | PRN

## 2013-12-28 MED ORDER — HYDROCODONE-ACETAMINOPHEN 5-325 MG PO TABS
1.0000 | ORAL_TABLET | Freq: Once | ORAL | Status: AC
  Administered 2013-12-28: 1 via ORAL
  Filled 2013-12-28: qty 1

## 2013-12-28 MED ORDER — NAPROXEN 500 MG PO TABS: 500.0000 mg | ORAL_TABLET | Freq: Two times a day (BID) | ORAL | Status: DC

## 2013-12-28 NOTE — ED Provider Notes (Signed)
Medical screening examination/treatment/procedure(s) were performed by non-physician practitioner and as supervising physician I was immediately available for consultation/collaboration.  EKG Interpretation   None         Houston Siren, MD 12/28/13 [phone]

## 2013-12-28 NOTE — ED Provider Notes (Signed)
CSN: [id number]     Arrival date & time 12/28/13  1305 History   First MD Initiated Contact with Patient 12/28/13 1358     Chief Complaint  Patient presents with  . Vaginal Pain   (Consider location/radiation/quality/duration/timing/severity/associated sxs/prior Treatment) HPI Brianna Velez is a 38 y.o. female who presents to ED with complaint of vaginal pain. Pt stats pain on and off for last year. This time it began 2 days ago. States that she was told this may be related to uterine fibroids which were diagnosed by MRI, also thought it could be nerve related and she was prescribed lyrica. States that she has not been able to follow up since. Denies urinary symptoms or vaginal discharge or bleeding. Sexually active, last intercourse last night, and it was painful. Denies fever, chills, back pain. No n/v/d.   Past Medical History  Diagnosis Date  . Migraine   . Leg pain    Past Surgical History  Procedure Laterality Date  . Tubal ligation     Family History  Problem Relation Age of Onset  . Diabetes Sister   . Heart disease Paternal Grandmother    History  Substance Use Topics  . Smoking status: Never Smoker   . Smokeless tobacco: Not on file  . Alcohol Use: No   OB History   Grav Para Term Preterm Abortions TAB SAB Ect Mult Living                 Review of Systems  Constitutional: Negative for fever and chills.  Respiratory: Negative for cough, chest tightness and shortness of breath.   Cardiovascular: Negative for chest pain, palpitations and leg swelling.  Gastrointestinal: Positive for abdominal pain. Negative for nausea, vomiting and diarrhea.  Genitourinary: Positive for vaginal pain and pelvic pain. Negative for dysuria, flank pain, vaginal bleeding and vaginal discharge.  Musculoskeletal: Negative for arthralgias, myalgias, neck pain and neck stiffness.  Skin: Negative for rash.  Neurological: Negative for dizziness, weakness and headaches.  All other systems  reviewed and are negative.    Allergies  Review of patient's allergies indicates no known allergies.  Home Medications   Current Outpatient Rx  Name  Route  Sig  Dispense  Refill  . ibuprofen (ADVIL,MOTRIN) 200 MG tablet   Oral   Take 800 mg by mouth every 6 (six) hours as needed for pain.         . pregabalin (LYRICA) 75 MG capsule   Oral   Take 1 capsule (75 mg total) by mouth 2 (two) times daily.   60 capsule   3    BP 107/68  Pulse 91  Temp(Src) 98.2 F (36.8 C) (Oral)  Resp 17  SpO2 100%  LMP 12/28/2013 Physical Exam  Nursing note and vitals reviewed. Constitutional: She appears well-developed and well-nourished. No distress.  HENT:  Head: Normocephalic.  Eyes: Conjunctivae are normal.  Neck: Neck supple.  Cardiovascular: Normal rate, regular rhythm and normal heart sounds.   Pulmonary/Chest: Effort normal and breath sounds normal. No respiratory distress. She has no wheezes. She has no rales.  Abdominal: Soft. Bowel sounds are normal. She exhibits no distension. There is no tenderness. There is no rebound.  Genitourinary: Vagina normal. No vaginal discharge found.  Cervical motion tenderness, uterine tenderness.   Musculoskeletal: She exhibits no edema.  Neurological: She is alert.  Skin: Skin is warm and dry.  Psychiatric: She has a normal mood and affect. Her behavior is normal.    ED Course  Procedures (  including critical care time) Labs Review Labs Reviewed  WET PREP, GENITAL - Abnormal; Notable for the following:    Clue Cells Wet Prep HPF POC FEW (*)    WBC, Wet Prep HPF POC MODERATE (*)    All other components within normal limits  GC/CHLAMYDIA PROBE AMP  URINALYSIS W MICROSCOPIC + REFLEX CULTURE  POCT PREGNANCY, URINE   Imaging Review No results found.  EKG Interpretation   None       MDM   1. Pelvic pain   2. BV (bacterial vaginosis)   3. Fibroid, uterine     Patient with pelvic pain for a year, and recent MRI showed  fibroids. Todays exam is unremarkable. Uterine tenderness noted. Doubt ovarian torsion given prolonged pain. Advised pt to follow up with gyn. Flagyl for BV. Pt asking for pain medications. Will start on naprosyn. Add norco for severe pain. Follow up referral given.   Filed Vitals:   12/28/13 1320  BP: 107/68  Pulse: 91  Temp: 98.2 F (36.8 C)  TempSrc: Oral  Resp: 17  SpO2: 100%      Renold Genta, PA-C 12/28/13 1538

## 2013-12-28 NOTE — ED Notes (Signed)
Pt c/o vaginal pain x 1 yr.  Hx of fibroids, denies discharge.

## 2013-12-28 NOTE — Discharge Instructions (Signed)
Take flagyl for bacterial vaginosis infection. Naprosyn for pain. Norco for severe pain. Follow up with GYN doctor.    Uterine Fibroid A uterine fibroid is a growth (tumor) that occurs in your uterus. This type of tumor is not cancerous and does not spread out of the uterus. You can have one or many fibroids. Fibroids can vary in size, weight, and where they grow in the uterus. Some can become quite large. Most fibroids do not require medical treatment, but some can cause pain or heavy bleeding during and between periods. CAUSES  A fibroid is the result of a single uterine cell that keeps growing (unregulated), which is different than most cells in the human body. Most cells have a control mechanism that keeps them from reproducing without control.  SIGNS AND SYMPTOMS   Bleeding.  Pelvic pain and pressure.  Bladder problems due to the size of the fibroid.  Infertility and miscarriages depending on the size and location of the fibroid. DIAGNOSIS  Uterine fibroids are diagnosed through a physical exam. Your health care provider may feel the lumpy tumors during a pelvic exam. Ultrasonography may be done to get information regarding size, location, and number of tumors.  TREATMENT   Your health care provider may recommend watchful waiting. This involves getting the fibroid checked by your health care provider to see if it grows or shrinks.   Hormone treatment or an intrauterine device (IUD) may be prescribed.   Surgery may be needed to remove the fibroids (myomectomy) or the uterus (hysterectomy). This depends on your situation. When fibroids interfere with fertility and a woman wants to become pregnant, a health care provider may recommend having the fibroids removed.  Melvin care depends on how you were treated. In general:   Keep all follow-up appointments with your health care provider.   Only take over-the-counter or prescription medicines as directed by your  health care provider. If you were prescribed a hormone treatment, take the hormone medicines exactly as directed. Do not take aspirin. It can cause bleeding.   Talk to your health care provider about taking iron pills.  If your periods are troublesome but not so heavy, lie down with your feet raised slightly above your heart. Place cold packs on your lower abdomen.   If your periods are heavy, write down the number of pads or tampons you use per month. Bring this information to your health care provider.   Include green vegetables in your diet.  SEEK IMMEDIATE MEDICAL CARE IF:  You have pelvic pain or cramps not controlled with medicines.   You have a sudden increase in pelvic pain.   You have an increase in bleeding between and during periods.   You have excessive periods and soak tampons or pads in a half hour or less.  You feel lightheaded or have fainting episodes. Document Released: 11/18/2000 Document Revised: 09/11/2013 Document Reviewed: 06/20/2013 [facility] Patient Information 2014 [place], [place].

## 2013-12-28 NOTE — ED Notes (Signed)
Pt reports having vaginal pain since Monday. She has been taking a family members Tramadol for pain with out relief. Denies vaginal discharge or any abnormal bleeding other that her irregular periods.

## 2013-12-30 LAB — GC/CHLAMYDIA PROBE AMP
CT Probe RNA: NEGATIVE
GC Probe RNA: NEGATIVE

## 2014-01-08 ENCOUNTER — Encounter: Payer: Medicaid Other | Admitting: Obstetrics & Gynecology

## 2014-02-05 ENCOUNTER — Emergency Department (HOSPITAL_COMMUNITY)
Admission: EM | Admit: 2014-02-05 | Discharge: 2014-02-05 | Disposition: A | Discharge status: Home / Self Care | Payer: Medicaid Other | Attending: Emergency Medicine | Admitting: Emergency Medicine

## 2014-02-05 ENCOUNTER — Encounter (HOSPITAL_COMMUNITY): Payer: Self-pay | Admitting: Emergency Medicine

## 2014-02-05 DIAGNOSIS — G8921 Chronic pain due to trauma: Secondary | ICD-10-CM | POA: Insufficient documentation

## 2014-02-05 DIAGNOSIS — G8929 Other chronic pain: Secondary | ICD-10-CM | POA: Insufficient documentation

## 2014-02-05 DIAGNOSIS — R198 Other specified symptoms and signs involving the digestive system and abdomen: Secondary | ICD-10-CM | POA: Insufficient documentation

## 2014-02-05 DIAGNOSIS — Z87828 Personal history of other (healed) physical injury and trauma: Secondary | ICD-10-CM | POA: Insufficient documentation

## 2014-02-05 DIAGNOSIS — M79606 Pain in leg, unspecified: Secondary | ICD-10-CM

## 2014-02-05 DIAGNOSIS — M79609 Pain in unspecified limb: Secondary | ICD-10-CM | POA: Insufficient documentation

## 2014-02-05 DIAGNOSIS — Z3202 Encounter for pregnancy test, result negative: Secondary | ICD-10-CM | POA: Insufficient documentation

## 2014-02-05 DIAGNOSIS — G43909 Migraine, unspecified, not intractable, without status migrainosus: Secondary | ICD-10-CM | POA: Insufficient documentation

## 2014-02-05 DIAGNOSIS — Z791 Long term (current) use of non-steroidal anti-inflammatories (NSAID): Secondary | ICD-10-CM | POA: Insufficient documentation

## 2014-02-05 DIAGNOSIS — Z79899 Other long term (current) drug therapy: Secondary | ICD-10-CM | POA: Insufficient documentation

## 2014-02-05 LAB — URINALYSIS, ROUTINE W REFLEX MICROSCOPIC
Bilirubin Urine: NEGATIVE
Glucose, UA: NEGATIVE mg/dL
Hgb urine dipstick: NEGATIVE
Ketones, ur: NEGATIVE mg/dL
Nitrite: NEGATIVE
Protein, ur: NEGATIVE mg/dL
Specific Gravity, Urine: 1.021 (ref 1.005–1.030)
Urobilinogen, UA: 0.2 mg/dL (ref 0.0–1.0)
pH: 7.5 (ref 5.0–8.0)

## 2014-02-05 LAB — URINE MICROSCOPIC-ADD ON

## 2014-02-05 LAB — POC URINE PREG, ED: Preg Test, Ur: NEGATIVE

## 2014-02-05 MED ORDER — IBUPROFEN 600 MG PO TABS: 600.0000 mg | ORAL_TABLET | Freq: Three times a day (TID) | ORAL | Status: DC

## 2014-02-05 MED ORDER — HYDROCODONE-ACETAMINOPHEN 5-325 MG PO TABS
1.0000 | ORAL_TABLET | Freq: Once | ORAL | Status: AC
  Administered 2014-02-05: 1 via ORAL
  Filled 2014-02-05: qty 1

## 2014-02-05 MED ORDER — HYDROCODONE-ACETAMINOPHEN 5-325 MG PO TABS: 1.0000 | ORAL_TABLET | ORAL | Status: DC | PRN

## 2014-02-05 NOTE — Discharge Instructions (Signed)
Call for a follow up appointment with a Family or Primary Care Provider.  Return if Symptoms worsen.   Take medication as prescribed.  Ice your left leg 3-4 times a day. Take Ibuprofen for your pain.
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
## 2014-02-05 NOTE — ED Notes (Addendum)
Pt c/o L upper leg pain.  Pt denies injury or trauma.  Pt also states she is supposed to make an appt at [facility] for eval of uterine fibroids.  Pt states she has not been able to get an appt.  Pt now states pain is a mix between pelvic pain and leg pain.

## 2014-02-07 ENCOUNTER — Emergency Department (HOSPITAL_COMMUNITY)
Admission: EM | Admit: 2014-02-07 | Discharge: 2014-02-07 | Discharge status: Against Medical Advice | Payer: Medicaid Other | Attending: Emergency Medicine | Admitting: Emergency Medicine

## 2014-02-07 ENCOUNTER — Encounter (HOSPITAL_COMMUNITY): Payer: Self-pay | Admitting: Emergency Medicine

## 2014-02-07 DIAGNOSIS — R51 Headache: Secondary | ICD-10-CM | POA: Insufficient documentation

## 2014-02-07 NOTE — ED Notes (Signed)
No answer when name called x 2

## 2014-02-07 NOTE — ED Notes (Signed)
Pt reports headache for the last 2 days with no relief with OTC pain relievers. Pt alert, oriented x4, NAD at present.

## 2014-02-11 NOTE — ED Provider Notes (Signed)
Medical screening examination/treatment/procedure(s) were performed by non-physician practitioner and as supervising physician I was immediately available for consultation/collaboration.   EKG Interpretation None        Christopher J. Pollina, MD 02/11/14 2118 

## 2014-03-17 ENCOUNTER — Ambulatory Visit: Payer: Medicaid Other | Attending: Internal Medicine | Admitting: Internal Medicine

## 2014-03-17 ENCOUNTER — Encounter: Payer: Self-pay | Admitting: Internal Medicine

## 2014-03-17 VITALS — BP 108/73 | HR 75 | Temp 98.2°F | Resp 16 | Ht 64.0 in | Wt 106.0 lb

## 2014-03-17 DIAGNOSIS — M25559 Pain in unspecified hip: Secondary | ICD-10-CM | POA: Insufficient documentation

## 2014-03-17 DIAGNOSIS — M25552 Pain in left hip: Secondary | ICD-10-CM

## 2014-03-17 DIAGNOSIS — D259 Leiomyoma of uterus, unspecified: Secondary | ICD-10-CM | POA: Insufficient documentation

## 2014-03-17 MED ORDER — TRAMADOL HCL 50 MG PO TABS: 50.0000 mg | ORAL_TABLET | Freq: Three times a day (TID) | ORAL | Status: DC | PRN
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
## 2014-03-17 NOTE — Progress Notes (Signed)
Pt is has been experiencing extreme groin pain for 2 months.

## 2014-03-17 NOTE — Patient Instructions (Signed)
Hip Pain  The hips join the upper legs to the lower pelvis. The bones, cartilage, tendons, and muscles of the hip joint perform a lot of work each day holding your body weight and allowing you to move around.  Hip pain is a common symptom. It can range from a minor ache to severe pain on 1 or both hips. Pain may be felt on the inside of the hip joint near the groin, or the outside near the buttocks and upper thigh. There may be swelling or stiffness as well. It occurs more often when a person walks or performs activity. There are many reasons hip pain can develop.  CAUSES   It is important to work with your caregiver to identify the cause since many conditions can impact the bones, cartilage, muscles, and tendons of the hips. Causes for hip pain include:   Broken (fractured) bones.   Separation of the thighbone from the hip socket (dislocation).   Torn cartilage of the hip joint.   Swelling (inflammation) of a tendon (tendonitis), the sac within the hip joint (bursitis), or a joint.   A weakening in the abdominal wall (hernia), affecting the nerves to the hip.   Arthritis in the hip joint or lining of the hip joint.   Pinched nerves in the back, hip, or upper thigh.   A bulging disc in the spine (herniated disc).   Rarely, bone infection or cancer.  DIAGNOSIS   The location of your hip pain will help your caregiver understand what may be causing the pain. A diagnosis is based on your medical history, your symptoms, results from your physical exam, and results from diagnostic tests. Diagnostic tests may include X-ray exams, a computerized magnetic scan (magnetic resonance imaging, MRI), or bone scan.  TREATMENT   Treatment will depend on the cause of your hip pain. Treatment may include:   Limiting activities and resting until symptoms improve.   Crutches or other walking supports (a cane or brace).   Ice, elevation, and compression.   Physical therapy or home exercises.   Shoe inserts or special  shoes.   Losing weight.   Medications to reduce pain.   Undergoing surgery.  HOME CARE INSTRUCTIONS    Only take over-the-counter or prescription medicines for pain, discomfort, or fever as directed by your caregiver.   Put ice on the injured area:   Put ice in a plastic bag.   Place a towel between your skin and the bag.   Leave the ice on for 15-20 minutes at a time, 03-04 times a day.   Keep your leg raised (elevated) when possible to lessen swelling.   Avoid activities that cause pain.   Follow specific exercises as directed by your caregiver.   Sleep with a pillow between your legs on your most comfortable side.   Record how often you have hip pain, the location of the pain, and what it feels like. This information may be helpful to you and your caregiver.   Ask your caregiver about returning to work or sports and whether you should drive.   Follow up with your caregiver for further exams, therapy, or testing as directed.  SEEK MEDICAL CARE IF:    Your pain or swelling continues or worsens after 1 week.   You are feeling unwell or have chills.   You have increasing difficulty with walking.   You have a loss of sensation or other new symptoms.   You have questions or concerns.  SEEK   IMMEDIATE MEDICAL CARE IF:    You cannot put weight on the affected hip.   You have fallen.   You have a sudden increase in pain and swelling in your hip.   You have a fever.  MAKE SURE YOU:    Understand these instructions.   Will watch your condition.   Will get help right away if you are not doing well or get worse.  Document Released: 05/11/2010 Document Revised: 02/13/2012 Document Reviewed: 05/11/2010  [employer] Patient Information 2014 [employer]

## 2014-03-26 ENCOUNTER — Encounter: Payer: Self-pay | Admitting: Obstetrics & Gynecology

## 2014-04-30 ENCOUNTER — Ambulatory Visit (INDEPENDENT_AMBULATORY_CARE_PROVIDER_SITE_OTHER): Payer: Medicaid Other | Admitting: Obstetrics & Gynecology

## 2014-04-30 ENCOUNTER — Encounter: Payer: Self-pay | Admitting: Obstetrics & Gynecology

## 2014-04-30 ENCOUNTER — Other Ambulatory Visit (HOSPITAL_COMMUNITY)
Admission: RE | Admit: 2014-04-30 | Discharge: 2014-04-30 | Disposition: A | Discharge status: Home / Self Care | Payer: Medicaid Other | Source: Ambulatory Visit | Attending: Obstetrics & Gynecology | Admitting: Obstetrics & Gynecology

## 2014-04-30 VITALS — BP 101/64 | HR 100 | Temp 97.6°F | Ht 64.0 in | Wt 98.3 lb

## 2014-04-30 DIAGNOSIS — Z01419 Encounter for gynecological examination (general) (routine) without abnormal findings: Secondary | ICD-10-CM | POA: Insufficient documentation

## 2014-04-30 DIAGNOSIS — N949 Unspecified condition associated with female genital organs and menstrual cycle: Secondary | ICD-10-CM

## 2014-04-30 DIAGNOSIS — Z1151 Encounter for screening for human papillomavirus (HPV): Secondary | ICD-10-CM | POA: Insufficient documentation

## 2014-04-30 DIAGNOSIS — R102 Pelvic and perineal pain: Secondary | ICD-10-CM

## 2014-04-30 DIAGNOSIS — Z113 Encounter for screening for infections with a predominantly sexual mode of transmission: Secondary | ICD-10-CM | POA: Insufficient documentation

## 2014-04-30 DIAGNOSIS — N920 Excessive and frequent menstruation with regular cycle: Secondary | ICD-10-CM

## 2014-04-30 MED ORDER — IBUPROFEN 600 MG PO TABS: 600.0000 mg | ORAL_TABLET | Freq: Three times a day (TID) | ORAL | Status: DC

## 2014-04-30 MED ORDER — TRAMADOL HCL 50 MG PO TABS: 50.0000 mg | ORAL_TABLET | Freq: Four times a day (QID) | ORAL | Status: DC | PRN
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
## 2014-05-07 ENCOUNTER — Ambulatory Visit (HOSPITAL_COMMUNITY)
Admission: RE | Admit: 2014-05-07 | Discharge: 2014-05-07 | Disposition: A | Discharge status: Home / Self Care | Payer: Medicaid Other | Source: Ambulatory Visit | Attending: Obstetrics & Gynecology | Admitting: Obstetrics & Gynecology

## 2014-05-07 DIAGNOSIS — D252 Subserosal leiomyoma of uterus: Secondary | ICD-10-CM | POA: Insufficient documentation

## 2014-05-07 DIAGNOSIS — N925 Other specified irregular menstruation: Secondary | ICD-10-CM | POA: Diagnosis not present

## 2014-05-07 DIAGNOSIS — N949 Unspecified condition associated with female genital organs and menstrual cycle: Secondary | ICD-10-CM | POA: Insufficient documentation

## 2014-05-07 DIAGNOSIS — D251 Intramural leiomyoma of uterus: Secondary | ICD-10-CM | POA: Insufficient documentation

## 2014-05-07 DIAGNOSIS — R102 Pelvic and perineal pain: Secondary | ICD-10-CM

## 2014-05-07 DIAGNOSIS — N938 Other specified abnormal uterine and vaginal bleeding: Secondary | ICD-10-CM | POA: Insufficient documentation

## 2014-05-14 ENCOUNTER — Telehealth: Payer: Self-pay | Admitting: *Deleted

## 2014-05-14 ENCOUNTER — Encounter: Payer: Self-pay | Admitting: Obstetrics & Gynecology

## 2014-05-14 NOTE — Telephone Encounter (Signed)
Pt left message requesting [place] results. I called pt and informed her of US shows uterine fibroids. This condition can contribute to her heavy periods and pelvic pain. Pt endorses relief of pain from Rx prescribed by Dr. Ihor Dow.  She will need follow up appt in early July per notes on 5/27. I advised pt that she will receive a call from our scheduling staff with appt information.  Pt agreed and voiced understanding.

## 2014-05-26 ENCOUNTER — Telehealth: Payer: Self-pay | Admitting: Internal Medicine

## 2014-05-26 ENCOUNTER — Telehealth: Payer: Self-pay | Admitting: General Practice

## 2014-05-26 NOTE — Telephone Encounter (Signed)
Pt. Is requesting refill for tramadol....Marland KitchenMarland KitchenPlease call patient [phone]

## 2014-05-26 NOTE — Telephone Encounter (Signed)
Patient called and left message that she would like a refill on her tramadol and ibuprofen. Called patient and she stated that she was having pain in her legs. Told patient she should contact [facility] and make an appt to be seen and that they can give her medication for pain as well. Patient verbalized understanding and had no other questions

## 2014-05-27 ENCOUNTER — Telehealth: Payer: Self-pay | Admitting: Emergency Medicine

## 2014-05-27 ENCOUNTER — Emergency Department (HOSPITAL_COMMUNITY)
Admission: EM | Admit: 2014-05-27 | Discharge: 2014-05-27 | Disposition: A | Discharge status: Home / Self Care | Payer: Medicaid Other | Source: Home / Self Care | Attending: Family Medicine | Admitting: Family Medicine

## 2014-05-27 ENCOUNTER — Encounter (HOSPITAL_COMMUNITY): Payer: Self-pay | Admitting: Emergency Medicine

## 2014-05-27 DIAGNOSIS — M25559 Pain in unspecified hip: Secondary | ICD-10-CM

## 2014-05-27 DIAGNOSIS — M25552 Pain in left hip: Principal | ICD-10-CM

## 2014-05-27 DIAGNOSIS — G8929 Other chronic pain: Secondary | ICD-10-CM

## 2014-05-27 MED ORDER — KETOROLAC TROMETHAMINE 10 MG PO TABS: 10.0000 mg | ORAL_TABLET | Freq: Four times a day (QID) | ORAL | Status: DC | PRN
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
## 2014-05-27 NOTE — Telephone Encounter (Signed)
Attempted to reach pt for pain med refill Tramadol 50 mg as needed Number disconnected
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
## 2014-05-27 NOTE — Discharge Instructions (Signed)
Use medicine as needed, see orthopedist if further problems.

## 2014-05-30 ENCOUNTER — Emergency Department (HOSPITAL_COMMUNITY)
Admission: EM | Admit: 2014-05-30 | Discharge: 2014-05-30 | Disposition: A | Discharge status: Home / Self Care | Payer: Medicaid Other | Attending: Emergency Medicine | Admitting: Emergency Medicine

## 2014-05-30 ENCOUNTER — Encounter (HOSPITAL_COMMUNITY): Payer: Self-pay | Admitting: Emergency Medicine

## 2014-05-30 ENCOUNTER — Telehealth: Payer: Self-pay | Admitting: Internal Medicine

## 2014-05-30 DIAGNOSIS — G8929 Other chronic pain: Secondary | ICD-10-CM | POA: Insufficient documentation

## 2014-05-30 DIAGNOSIS — Z8742 Personal history of other diseases of the female genital tract: Secondary | ICD-10-CM | POA: Insufficient documentation

## 2014-05-30 DIAGNOSIS — M792 Neuralgia and neuritis, unspecified: Secondary | ICD-10-CM

## 2014-05-30 DIAGNOSIS — Z7982 Long term (current) use of aspirin: Secondary | ICD-10-CM | POA: Insufficient documentation

## 2014-05-30 DIAGNOSIS — Z791 Long term (current) use of non-steroidal anti-inflammatories (NSAID): Secondary | ICD-10-CM | POA: Insufficient documentation

## 2014-05-30 DIAGNOSIS — M79605 Pain in left leg: Secondary | ICD-10-CM

## 2014-05-30 DIAGNOSIS — M79602 Pain in left arm: Secondary | ICD-10-CM

## 2014-05-30 DIAGNOSIS — M79609 Pain in unspecified limb: Secondary | ICD-10-CM | POA: Insufficient documentation

## 2014-05-30 DIAGNOSIS — G43909 Migraine, unspecified, not intractable, without status migrainosus: Secondary | ICD-10-CM | POA: Insufficient documentation

## 2014-05-30 MED ORDER — TRAMADOL HCL 50 MG PO TABS: 50.0000 mg | ORAL_TABLET | Freq: Two times a day (BID) | ORAL | Status: DC | PRN

## 2014-05-30 NOTE — Telephone Encounter (Signed)
Pt requesting refill of Tramadol. Please f/u

## 2014-05-30 NOTE — Discharge Instructions (Signed)
Please call your doctor for a followup appointment within 24-48 hours. When you talk to your doctor please let them know that you were seen in the emergency department and have them acquire all of your records so that they can discuss the findings with you and formulate a treatment plan to fully care for your new and ongoing problems. Please call and set-up an appointment with your primary care provider Please call and set up an appointment with orthopedics May need to discuss pain management clinic with primary care provider since this pain does been ongoing for the past 6 years Please take medications as prescribed Please avoid any physical or shortness activity Please massage with icy hot ointment Please continue to monitor symptoms closely and if symptoms are to worsen or change (fever greater than 1, chills, chest pain, shortness of breath, difficulty breathing, numbness, tingling, worsening or changes to pain pattern, fall, injury, loss of sensation) please report back to the ED immediately  Chronic Pain Chronic pain can be defined as pain that is off and on and lasts for 3-6 months or longer. Many things cause chronic pain, which can make it difficult to make a diagnosis. There are many treatment options available for chronic pain. However, finding a treatment that works well for you may require trying various approaches until the right one is found. Many people benefit from a combination of two or more types of treatment to control their pain. SYMPTOMS  Chronic pain can occur anywhere in the body and can range from mild to very severe. Some types of chronic pain include:  Headache.  Low back pain.  Cancer pain.  Arthritis pain.  Neurogenic pain. This is pain resulting from damage to nerves. People with chronic pain may also have other symptoms such as:  Depression.  Anger.  Insomnia.  Anxiety. DIAGNOSIS  Your health care provider will help diagnose your condition over time.  In many cases, the initial focus will be on excluding possible conditions that could be causing the pain. Depending on your symptoms, your health care provider may order tests to diagnose your condition. Some of these tests may include:   Blood tests.   CT scan.   MRI.   X-rays.   Ultrasounds.   Nerve conduction studies.  You may need to see a specialist.  TREATMENT  Finding treatment that works well may take time. You may be referred to a pain specialist. He or she may prescribe medicine or therapies, such as:   Mindful meditation or yoga.  Shots (injections) of numbing or pain-relieving medicines into the spine or area of pain.  Local electrical stimulation.  Acupuncture.   Massage therapy.   Aroma, color, light, or sound therapy.   Biofeedback.   Working with a physical therapist to keep from getting stiff.   Regular, gentle exercise.   Cognitive or behavioral therapy.   Group support.  Sometimes, surgery may be recommended.  HOME CARE INSTRUCTIONS   Take all medicines as directed by your health care provider.   Lessen stress in your life by relaxing and doing things such as listening to calming music.   Exercise or be active as directed by your health care provider.   Eat a healthy diet and include things such as vegetables, fruits, fish, and lean meats in your diet.   Keep all follow-up appointments with your health care provider.   Attend a support group with others suffering from chronic pain. SEEK MEDICAL CARE IF:   Your pain gets  worse.   You develop a new pain that was not there before.   You cannot tolerate medicines given to you by your health care provider.   You have new symptoms since your last visit with your health care provider.  SEEK IMMEDIATE MEDICAL CARE IF:   You feel weak.   You have decreased sensation or numbness.   You lose control of bowel or bladder function.   Your pain suddenly gets much  worse.   You develop shaking.  You develop chills.  You develop confusion.  You develop chest pain.  You develop shortness of breath.  MAKE SURE YOU:  Understand these instructions.  Will watch your condition.  Will get help right away if you are not doing well or get worse. Document Released: 08/13/2002 Document Revised: 07/24/2013 Document Reviewed: 05/17/2013 [facility] Patient Information 2015 [place], [place]. This information is not intended to replace advice given to you by your health care provider. Make sure you discuss any questions you have with your health care provider.

## 2014-05-30 NOTE — ED Notes (Signed)
Patient c/o she is not getting any relief of her pain w the toradol prescribed to her on her last visit, and would like for [place] to call in tramadol for her pain . Per Dr Juventino Slovak, since we are not able to call in narcotics like tramadol,  she is to try to use the medication through the weekend, and to add tylenol or advil for relief until she can get in to see the clinic at [facility] for her 9:45 am appt on Monday. Patient hung up on nurse when she was advised of this information

## 2014-05-30 NOTE — ED Notes (Signed)
Resents with left leg pain that is intermittent for the past 5 years, seen at [facility] given toradol with no relief. CMS intact. Pt reports tramadol works for her.

## 2014-05-30 NOTE — Telephone Encounter (Signed)
Pt has called in today to see if she can get a medication refill until Monday for traMADol (ULTRAM) 50 MG tablet; Pt is experiencing pain in her hip and would like to be contacted by a nurse; please f/u with pt @ [phone]
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
## 2014-05-30 NOTE — ED Notes (Signed)
Patient ambulated out of fast track area to the front door of the ER without difficulty or assistance.
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
## 2014-05-31 NOTE — ED Provider Notes (Signed)
Medical screening examination/treatment/procedure(s) were performed by non-physician practitioner and as supervising physician I was immediately available for consultation/collaboration.  Richarda Blade, MD 05/31/14 [phone]

## 2014-06-02 ENCOUNTER — Ambulatory Visit: Payer: Medicaid Other | Attending: Internal Medicine | Admitting: Internal Medicine

## 2014-06-02 ENCOUNTER — Encounter: Payer: Self-pay | Admitting: Internal Medicine

## 2014-06-02 VITALS — BP 106/71 | HR 84 | Temp 98.3°F | Resp 14 | Ht 64.0 in | Wt 102.0 lb

## 2014-06-02 DIAGNOSIS — Z7982 Long term (current) use of aspirin: Secondary | ICD-10-CM | POA: Diagnosis not present

## 2014-06-02 DIAGNOSIS — M25559 Pain in unspecified hip: Secondary | ICD-10-CM | POA: Diagnosis not present

## 2014-06-02 DIAGNOSIS — Z79899 Other long term (current) drug therapy: Secondary | ICD-10-CM | POA: Diagnosis not present

## 2014-06-02 DIAGNOSIS — M79609 Pain in unspecified limb: Secondary | ICD-10-CM | POA: Diagnosis not present

## 2014-06-02 DIAGNOSIS — M25552 Pain in left hip: Secondary | ICD-10-CM

## 2014-06-02 MED ORDER — TRAMADOL HCL 50 MG PO TABS: 50.0000 mg | ORAL_TABLET | Freq: Two times a day (BID) | ORAL | Status: DC | PRN

## 2014-06-02 MED ORDER — DICLOFENAC SODIUM 1 % TD GEL: 2.0000 g | Freq: Four times a day (QID) | TRANSDERMAL | Status: DC

## 2014-06-02 MED ORDER — CYCLOBENZAPRINE HCL 10 MG PO TABS: 10.0000 mg | ORAL_TABLET | Freq: Three times a day (TID) | ORAL | Status: DC | PRN

## 2014-06-02 NOTE — Telephone Encounter (Signed)
Pt left three additional voicemails regarding refill, says she will be coming to office for 06/02/14 appt.
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
## 2014-06-02 NOTE — Patient Instructions (Signed)
Hip Pain The hips join the upper legs to the lower pelvis. The bones, cartilage, tendons, and muscles of the hip joint perform a lot of work each day holding your body weight and allowing you to move around. Hip pain is a common symptom. It can range from a minor ache to severe pain on 1 or both hips. Pain may be felt on the inside of the hip joint near the groin, or the outside near the buttocks and upper thigh. There may be swelling or stiffness as well. It occurs more often when a person walks or performs activity. There are many reasons hip pain can develop. CAUSES  It is important to work with your caregiver to identify the cause since many conditions can impact the bones, cartilage, muscles, and tendons of the hips. Causes for hip pain include:  Broken (fractured) bones.  Separation of the thighbone from the hip socket (dislocation).  Torn cartilage of the hip joint.  Swelling (inflammation) of a tendon (tendonitis), the sac within the hip joint (bursitis), or a joint.  A weakening in the abdominal wall (hernia), affecting the nerves to the hip.  Arthritis in the hip joint or lining of the hip joint.  Pinched nerves in the back, hip, or upper thigh.  A bulging disc in the spine (herniated disc).  Rarely, bone infection or cancer. DIAGNOSIS  The location of your hip pain will help your caregiver understand what may be causing the pain. A diagnosis is based on your medical history, your symptoms, results from your physical exam, and results from diagnostic tests. Diagnostic tests may include X-ray exams, a computerized magnetic scan (magnetic resonance imaging, MRI), or bone scan. TREATMENT  Treatment will depend on the cause of your hip pain. Treatment may include:  Limiting activities and resting until symptoms improve.  Crutches or other walking supports (a cane or brace).  Ice, elevation, and compression.  Physical therapy or home exercises.  Shoe inserts or special  shoes.  Losing weight.  Medications to reduce pain.  Undergoing surgery. HOME CARE INSTRUCTIONS   Only take over-the-counter or prescription medicines for pain, discomfort, or fever as directed by your caregiver.  Put ice on the injured area:  Put ice in a plastic bag.  Place a towel between your skin and the bag.  Leave the ice on for 15-20 minutes at a time, 03-04 times a day.  Keep your leg raised (elevated) when possible to lessen swelling.  Avoid activities that cause pain.  Follow specific exercises as directed by your caregiver.  Sleep with a pillow between your legs on your most comfortable side.  Record how often you have hip pain, the location of the pain, and what it feels like. This information may be helpful to you and your caregiver.  Ask your caregiver about returning to work or sports and whether you should drive.  Follow up with your caregiver for further exams, therapy, or testing as directed. SEEK MEDICAL CARE IF:   Your pain or swelling continues or worsens after 1 week.  You are feeling unwell or have chills.  You have increasing difficulty with walking.  You have a loss of sensation or other new symptoms.  You have questions or concerns. SEEK IMMEDIATE MEDICAL CARE IF:   You cannot put weight on the affected hip.  You have fallen.  You have a sudden increase in pain and swelling in your hip.  You have a fever. MAKE SURE YOU:   Understand these instructions.  Will watch your condition. °· Will get help right away if you are not doing well or get worse. °Document Released: 05/11/2010 Document Revised: 02/13/2012 Document Reviewed: 05/11/2010 °ExitCare® Patient Information ©2015 [employer]. This information is not intended to replace advice given to you by your health care provider. Make sure you discuss any questions you have with your health care provider. ° °

## 2014-06-02 NOTE — Progress Notes (Signed)
Pt is here following up on her left leg pain. Pt states that she has no improvement. Pt is requesting a referral to a neurologist.

## 2014-06-19 ENCOUNTER — Ambulatory Visit (INDEPENDENT_AMBULATORY_CARE_PROVIDER_SITE_OTHER): Payer: Medicaid Other | Admitting: Obstetrics & Gynecology

## 2014-06-19 ENCOUNTER — Encounter: Payer: Self-pay | Admitting: Obstetrics & Gynecology

## 2014-06-19 VITALS — BP 101/67 | HR 91 | Temp 97.5°F | Ht 64.0 in | Wt 100.4 lb

## 2014-06-19 DIAGNOSIS — N920 Excessive and frequent menstruation with regular cycle: Secondary | ICD-10-CM

## 2014-06-19 DIAGNOSIS — N921 Excessive and frequent menstruation with irregular cycle: Secondary | ICD-10-CM

## 2014-06-19 MED ORDER — NORGESTIMATE-ETH ESTRADIOL 0.25-35 MG-MCG PO TABS: 1.0000 | ORAL_TABLET | Freq: Every day | ORAL | Status: DC

## 2014-06-19 NOTE — Patient Instructions (Signed)

## 2014-06-19 NOTE — Progress Notes (Signed)
Subjective:     Patient ID: Brianna Velez, female   DOB: 1976/05/27, 38 y.o.   MRN: [id number]  HPI [id number] LMP -current.  Pt reports continued bleeding with pain.  No change since last visit.  Review of Systems     Objective:   Physical Exam BP 101/67  Pulse 91  Temp(Src) 97.5 F (36.4 C) (Oral)  Ht 5\' 4"  (1.626 m)  Wt 100 lb 6.4 oz (45.541 kg)  BMI 17.23 kg/m2  LMP 05/14/2014 Exam deferred   05/07/2014 CLINICAL DATA: Prolonged bleeding, pelvic pain.  EXAM:  TRANSABDOMINAL AND TRANSVAGINAL ULTRASOUND OF PELVIS  TECHNIQUE:  Both transabdominal and transvaginal ultrasound examinations of the  pelvis were performed. Transabdominal technique was performed for  global imaging of the pelvis including uterus, ovaries, adnexal  regions, and pelvic cul-de-sac. It was necessary to proceed with  endovaginal exam following the transabdominal exam to visualize the  endometrium and right ovary.  COMPARISON: None  FINDINGS:  Uterus  Measurements: 11.7 x 7.5 x 9.4 cm. Multiple fibroids noted. Left  intramural fibroid is the largest, measuring 4.5 x 3.6 x 3.5 cm.  Exophytic subserosal fundal fibroid measures 3.3 x 2.2 x 2.1 cm.  Fundal intramural fibroid measures 3.9 x 3.7 x 3.3 cm.  Endometrium  Thickness: 5 mm. No focal abnormality visualized.  Right ovary  Measurements: 2.9 x 1.4 x 1.7 cm. Normal appearance/no adnexal mass.  Left ovary  Measurements: 2.6 x 1.5 x 2.0 cm. Normal appearance/no adnexal mass.  Other findings  No free fluid.  IMPRESSION:  Enlarged fibroid uterus.      Assessment:     Menorrhagia thought due to uterine fibroids.  Will try conservative measures with OCP's.  Pt sites no contraindications.     Plan:     Sprintec 1 po q day F/u 6 months or sooner prn

## 2014-08-04 ENCOUNTER — Ambulatory Visit: Payer: Medicaid Other | Attending: Internal Medicine | Admitting: Internal Medicine

## 2014-08-04 ENCOUNTER — Encounter: Payer: Self-pay | Admitting: Internal Medicine

## 2014-08-04 VITALS — BP 103/70 | HR 76 | Temp 98.0°F | Resp 16 | Wt 98.8 lb

## 2014-08-04 DIAGNOSIS — M25559 Pain in unspecified hip: Secondary | ICD-10-CM | POA: Diagnosis present

## 2014-08-04 DIAGNOSIS — M25552 Pain in left hip: Secondary | ICD-10-CM

## 2014-08-04 MED ORDER — TRAMADOL HCL 50 MG PO TABS: 50.0000 mg | ORAL_TABLET | Freq: Two times a day (BID) | ORAL | Status: DC | PRN
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
## 2014-08-07 ENCOUNTER — Encounter (HOSPITAL_COMMUNITY): Payer: Self-pay | Admitting: Emergency Medicine

## 2014-08-07 ENCOUNTER — Emergency Department (HOSPITAL_COMMUNITY)
Admission: EM | Admit: 2014-08-07 | Discharge: 2014-08-07 | Disposition: A | Discharge status: Home / Self Care | Payer: Medicaid Other | Attending: Emergency Medicine | Admitting: Emergency Medicine

## 2014-08-07 DIAGNOSIS — T3995XA Adverse effect of unspecified nonopioid analgesic, antipyretic and antirheumatic, initial encounter: Secondary | ICD-10-CM | POA: Diagnosis not present

## 2014-08-07 DIAGNOSIS — K5900 Constipation, unspecified: Secondary | ICD-10-CM | POA: Insufficient documentation

## 2014-08-07 DIAGNOSIS — H9209 Otalgia, unspecified ear: Secondary | ICD-10-CM | POA: Diagnosis not present

## 2014-08-07 DIAGNOSIS — Z791 Long term (current) use of non-steroidal anti-inflammatories (NSAID): Secondary | ICD-10-CM | POA: Diagnosis not present

## 2014-08-07 DIAGNOSIS — Z8742 Personal history of other diseases of the female genital tract: Secondary | ICD-10-CM | POA: Insufficient documentation

## 2014-08-07 DIAGNOSIS — G43909 Migraine, unspecified, not intractable, without status migrainosus: Secondary | ICD-10-CM | POA: Diagnosis not present

## 2014-08-07 DIAGNOSIS — Z79899 Other long term (current) drug therapy: Secondary | ICD-10-CM | POA: Diagnosis not present

## 2014-08-07 DIAGNOSIS — K5903 Drug induced constipation: Secondary | ICD-10-CM

## 2014-08-07 DIAGNOSIS — R110 Nausea: Secondary | ICD-10-CM | POA: Diagnosis present

## 2014-08-07 MED ORDER — POLYETHYLENE GLYCOL 3350 17 G PO PACK: 17.0000 g | PACK | Freq: Every day | ORAL | Status: DC | PRN

## 2014-08-07 MED ORDER — ONDANSETRON 4 MG PO TBDP
4.0000 mg | ORAL_TABLET | Freq: Once | ORAL | Status: AC
  Administered 2014-08-07: 4 mg via ORAL
  Filled 2014-08-07: qty 1

## 2014-08-07 MED ORDER — DOCUSATE SODIUM 100 MG PO CAPS: 100.0000 mg | ORAL_CAPSULE | Freq: Two times a day (BID) | ORAL | Status: DC
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
## 2014-08-07 NOTE — ED Notes (Signed)
Pt in c/o bilateral earache and nausea since yesterday, denies vomiting, no distress noted

## 2014-08-07 NOTE — Discharge Instructions (Signed)

## 2014-08-07 NOTE — ED Provider Notes (Signed)
I saw and evaluated the patient, reviewed the resident's note and I agree with the findings and plan.  Patient with nausea and ear discomfort.  She has been constipated.  No fevers or chills.    Vitals are stable and she is afebrile.  No hypoxia.  No distress.  TM clear and abd benign.  Will treat with stool softeners, prn follow up.      Veryl Speak, MD 08/07/14 [phone]

## 2014-08-09 ENCOUNTER — Emergency Department (HOSPITAL_COMMUNITY)
Admission: EM | Admit: 2014-08-09 | Discharge: 2014-08-10 | Disposition: A | Discharge status: Home / Self Care | Payer: Medicaid Other | Attending: Emergency Medicine | Admitting: Emergency Medicine

## 2014-08-09 ENCOUNTER — Encounter (HOSPITAL_COMMUNITY): Payer: Self-pay | Admitting: Emergency Medicine

## 2014-08-09 DIAGNOSIS — D259 Leiomyoma of uterus, unspecified: Secondary | ICD-10-CM | POA: Diagnosis not present

## 2014-08-09 DIAGNOSIS — G43909 Migraine, unspecified, not intractable, without status migrainosus: Secondary | ICD-10-CM | POA: Insufficient documentation

## 2014-08-09 DIAGNOSIS — R109 Unspecified abdominal pain: Secondary | ICD-10-CM | POA: Diagnosis not present

## 2014-08-09 DIAGNOSIS — Z87891 Personal history of nicotine dependence: Secondary | ICD-10-CM | POA: Insufficient documentation

## 2014-08-09 DIAGNOSIS — Z3202 Encounter for pregnancy test, result negative: Secondary | ICD-10-CM | POA: Diagnosis not present

## 2014-08-09 DIAGNOSIS — K5900 Constipation, unspecified: Secondary | ICD-10-CM | POA: Insufficient documentation

## 2014-08-09 DIAGNOSIS — R000 Tachycardia, unspecified: Secondary | ICD-10-CM | POA: Insufficient documentation

## 2014-08-09 DIAGNOSIS — Z79899 Other long term (current) drug therapy: Secondary | ICD-10-CM | POA: Insufficient documentation

## 2014-08-09 MED ORDER — POLYETHYLENE GLYCOL 3350 17 G PO PACK
17.0000 g | PACK | Freq: Once | ORAL | Status: AC
  Administered 2014-08-10: 17 g via ORAL
  Filled 2014-08-09: qty 1

## 2014-08-09 MED ORDER — KETOROLAC TROMETHAMINE 60 MG/2ML IM SOLN
60.0000 mg | Freq: Once | INTRAMUSCULAR | Status: AC
  Administered 2014-08-10: 60 mg via INTRAMUSCULAR
  Filled 2014-08-09: qty 2

## 2014-08-09 NOTE — ED Notes (Signed)
attemped to draw labs x2 w/o luck.
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
## 2014-08-09 NOTE — ED Notes (Signed)
Patient states she has been lower abd pain for about 4 days  Works in nursing home

## 2014-08-10 LAB — I-STAT CHEM 8, ED
BUN: 8 mg/dL (ref 6–23)
Calcium, Ion: 1.13 mmol/L (ref 1.12–1.23)
Chloride: 105 mEq/L (ref 96–112)
Creatinine, Ser: 0.8 mg/dL (ref 0.50–1.10)
Glucose, Bld: 99 mg/dL (ref 70–99)
HCT: 37 % (ref 36.0–46.0)
Hemoglobin: 12.6 g/dL (ref 12.0–15.0)
Potassium: 7 mEq/L (ref 3.7–5.3)
Sodium: 134 mEq/L — ABNORMAL LOW (ref 137–147)
TCO2: 26 mmol/L (ref 0–100)

## 2014-08-10 LAB — LIPASE, BLOOD: Lipase: 27 U/L (ref 11–59)

## 2014-08-10 LAB — BASIC METABOLIC PANEL
Anion gap: 12 (ref 5–15)
BUN: 8 mg/dL (ref 6–23)
CO2: 23 mEq/L (ref 19–32)
Calcium: 9.2 mg/dL (ref 8.4–10.5)
Chloride: 96 mEq/L (ref 96–112)
Creatinine, Ser: 0.67 mg/dL (ref 0.50–1.10)
GFR calc Af Amer: >90 mL/min (ref >90)
GFR calc non Af Amer: >90 mL/min (ref >90)
Glucose, Bld: 97 mg/dL (ref 70–99)
Potassium: 6.6 mEq/L (ref 3.7–5.3)
Sodium: 131 mEq/L — ABNORMAL LOW (ref 137–147)

## 2014-08-10 LAB — URINALYSIS, ROUTINE W REFLEX MICROSCOPIC
Bilirubin Urine: NEGATIVE
Glucose, UA: NEGATIVE mg/dL
Hgb urine dipstick: NEGATIVE
Ketones, ur: 15 mg/dL — AB
Leukocytes, UA: NEGATIVE
Nitrite: NEGATIVE
Protein, ur: 30 mg/dL — AB
Specific Gravity, Urine: 1.03 (ref 1.005–1.030)
Urobilinogen, UA: 1 mg/dL (ref 0.0–1.0)
pH: 6 (ref 5.0–8.0)

## 2014-08-10 LAB — POC URINE PREG, ED: Preg Test, Ur: NEGATIVE

## 2014-08-10 LAB — URINE MICROSCOPIC-ADD ON

## 2014-08-10 LAB — WET PREP, GENITAL
Trich, Wet Prep: NONE SEEN
WBC, Wet Prep HPF POC: NONE SEEN
Yeast Wet Prep HPF POC: NONE SEEN

## 2014-08-10 LAB — POTASSIUM: Potassium: 4.1 mEq/L (ref 3.7–5.3)

## 2014-08-10 MED ORDER — ONDANSETRON 4 MG PO TBDP
ORAL_TABLET | ORAL | Status: DC
Start: 2014-08-10 — End: 2015-01-21

## 2014-08-10 MED ORDER — POLYETHYLENE GLYCOL 3350 17 G PO PACK: 17.0000 g | PACK | Freq: Every day | ORAL | Status: DC

## 2014-08-10 MED ORDER — NAPROXEN 500 MG PO TABS: 500.0000 mg | ORAL_TABLET | Freq: Two times a day (BID) | ORAL | Status: DC

## 2014-08-10 MED ORDER — ONDANSETRON 4 MG PO TBDP
4.0000 mg | ORAL_TABLET | Freq: Once | ORAL | Status: AC
  Administered 2014-08-10: 4 mg via ORAL
  Filled 2014-08-10: qty 1

## 2014-08-10 MED ORDER — MORPHINE SULFATE 4 MG/ML IJ SOLN
6.0000 mg | Freq: Once | INTRAMUSCULAR | Status: AC
  Administered 2014-08-10: 6 mg via INTRAVENOUS
  Filled 2014-08-10: qty 2

## 2014-08-10 MED ORDER — ONDANSETRON HCL 4 MG/2ML IJ SOLN
4.0000 mg | Freq: Once | INTRAMUSCULAR | Status: AC
  Administered 2014-08-10: 4 mg via INTRAVENOUS
  Filled 2014-08-10: qty 2

## 2014-08-10 MED ORDER — SODIUM CHLORIDE 0.9 % IV BOLUS (SEPSIS)
500.0000 mL | Freq: Once | INTRAVENOUS | Status: AC
  Administered 2014-08-10: 500 mL via INTRAVENOUS

## 2014-08-10 NOTE — ED Notes (Signed)
Patient tolerated PO, but feels "a little nauseated after". Zofran given, Rx obtained for continued therapy at home.

## 2014-08-10 NOTE — ED Notes (Signed)
Crackers and Ginger Ale given for PO challenge.

## 2014-08-10 NOTE — Discharge Instructions (Signed)
If you were given medicines take as directed.  If you are on coumadin or contraceptives realize their levels and effectiveness is altered by many different medicines.  If you have any reaction (rash, tongues swelling, other) to the medicines stop taking and see a physician.   If your abdominal pain worsens, you develop fevers, persistent vomiting or if your pain moves to the right lower quadrant return immediately to see your physician or come to the Emergency Department.  Thank you Take miralax until stools soft and then stop taking.  Please follow up as directed and return to the ER or see a physician for new or worsening symptoms.  Thank you. Filed Vitals:   08/09/14 2123 08/09/14 2124 08/10/14 0015 08/10/14 0032  BP: 117/77  120/87 118/71  Pulse: 125  110 80  Temp: 97.9 F (36.6 C)   98.3 F (36.8 C)  TempSrc: Oral   Oral  Resp: 20   14  Height:  5\' 4"  (1.626 m)    Weight:  98 lb (44.453 kg)    SpO2: 100%  100% 100%

## 2014-08-12 LAB — GC/CHLAMYDIA PROBE AMP
CT Probe RNA: NEGATIVE
GC Probe RNA: NEGATIVE

## 2014-10-06 ENCOUNTER — Encounter (HOSPITAL_COMMUNITY): Payer: Self-pay | Admitting: Emergency Medicine

## 2014-10-07 ENCOUNTER — Emergency Department (HOSPITAL_COMMUNITY)
Admission: EM | Admit: 2014-10-07 | Discharge: 2014-10-07 | Disposition: A | Discharge status: Home / Self Care | Payer: Medicaid Other | Source: Home / Self Care | Attending: Family Medicine | Admitting: Family Medicine

## 2014-10-07 ENCOUNTER — Encounter (HOSPITAL_COMMUNITY): Payer: Self-pay | Admitting: Emergency Medicine

## 2014-10-07 DIAGNOSIS — G8929 Other chronic pain: Secondary | ICD-10-CM

## 2014-10-07 DIAGNOSIS — M79602 Pain in left arm: Secondary | ICD-10-CM

## 2014-10-07 DIAGNOSIS — M25552 Pain in left hip: Secondary | ICD-10-CM

## 2014-10-07 DIAGNOSIS — M79605 Pain in left leg: Secondary | ICD-10-CM

## 2014-10-07 MED ORDER — KETOROLAC TROMETHAMINE 60 MG/2ML IM SOLN
30.0000 mg | Freq: Once | INTRAMUSCULAR | Status: AC
  Administered 2014-10-07: 30 mg via INTRAMUSCULAR

## 2014-10-07 MED ORDER — KETOROLAC TROMETHAMINE 30 MG/ML IJ SOLN
INTRAMUSCULAR | Status: AC
  Filled 2014-10-07: qty 1

## 2014-10-07 MED ORDER — TRAMADOL HCL 50 MG PO TABS: 50.0000 mg | ORAL_TABLET | Freq: Two times a day (BID) | ORAL | Status: DC | PRN

## 2014-10-07 NOTE — ED Provider Notes (Signed)
CSN: [id number]     Arrival date & time 10/07/14  62 History   First MD Initiated Contact with Patient 10/07/14 1542     Chief Complaint  Patient presents with  . Leg Pain   (Consider location/radiation/quality/duration/timing/severity/associated sxs/prior Treatment) HPI  L leg pain: started 3 days ago. Unsure of trigger. Chronic pain in leg that comes and goes. Similar to previous episodes. Described as a "toothache", that is sharp w/ some ambulation. Tramadol in the past w/ benefit. Ibuprofen and BC w/o much benefit. Voltaren gel w/o much benefit. Pain is non-radiating    Past Medical History  Diagnosis Date  . Migraine   . Leg pain   . Fibroids    Past Surgical History  Procedure Laterality Date  . Tubal ligation     Family History  Problem Relation Age of Onset  . Diabetes Sister   . Heart disease Paternal Grandmother    History  Substance Use Topics  . Smoking status: Former Research scientist (life sciences)  . Smokeless tobacco: Never Used  . Alcohol Use: No   OB History    Gravida Para Term Preterm AB TAB SAB Ectopic Multiple Living   2 2 2  0 0 0 0 0 0 2     Review of Systems Per HPI with all other pertinent systems negative.   Allergies  Review of patient's allergies indicates no known allergies.  Home Medications   Prior to Admission medications   Medication Sig Start Date End Date Taking? Authorizing Provider  Aspirin-Acetaminophen-Caffeine (GOODYS EXTRA STRENGTH PO) Take 1 packet by mouth every 6 (six) hours as needed (pain).   Yes Historical Provider, MD  docusate sodium (COLACE) 100 MG capsule Take 1 capsule (100 mg total) by mouth 2 (two) times daily. 08/07/14   Olam Idler, MD  naproxen (NAPROSYN) 500 MG tablet Take 1 tablet (500 mg total) by mouth 2 (two) times daily. 08/10/14   Mariea Clonts, MD  ondansetron (ZOFRAN ODT) 4 MG disintegrating tablet 4mg  ODT q4 hours prn nausea/vomit 08/10/14   Mariea Clonts, MD  polyethylene glycol [facility] / GLYCOLAX) packet Take 17 g by  mouth daily as needed for mild constipation. 08/07/14   Olam Idler, MD  polyethylene glycol [facility] / Floria Raveling) packet Take 17 g by mouth daily. 08/10/14   Mariea Clonts, MD  traMADol (ULTRAM) 50 MG tablet Take 1 tablet (50 mg total) by mouth every 12 (twelve) hours as needed for moderate pain. 10/07/14   Waldemar Dickens, MD   BP 108/75 mmHg  Pulse 115  Temp(Src) 97.8 F (36.6 C) (Oral)  Resp 16  SpO2 100%  LMP 09/19/2014 Physical Exam  Constitutional: She is oriented to person, place, and time. She appears well-developed and well-nourished.  Cachectic appearing  HENT:  Head: Normocephalic and atraumatic.  Eyes: EOM are normal. Pupils are equal, round, and reactive to light.  Neck: Normal range of motion.  Cardiovascular: Normal rate, normal heart sounds and intact distal pulses.   No murmur heard. Pulmonary/Chest: Effort normal and breath sounds normal.  Abdominal: Soft. Bowel sounds are normal. She exhibits no distension. There is no tenderness. There is no rebound.  Musculoskeletal: Normal range of motion.  Mid ant to lateral L thigh intermittent ttp. No swelling or irredularity. Soft.   Neurological: She is alert and oriented to person, place, and time.  Skin: Skin is warm and dry.  Psychiatric: She has a normal mood and affect. Judgment and thought content normal.    ED Course  Procedures (including critical care time) Labs Review Labs Reviewed - No data to display  Imaging Review No results found.   MDM   1. Chronic leg pain, left   2. Left hip pain    Chronic condition for pt MRI of L hip from 11/05/13 reviewed  - nml.  Suspect psychosomatic vs drug seeking vs idopathic vs neuropathic - toradol IM 30mg  in office - Tramadol #15 - all refills for this condition must come through PCP dr. Doreene Velez.  Precautions given and all questions answered  Brianna Darner, MD Family Medicine 10/07/2014, 3:59 PM     Waldemar Dickens, MD 10/07/14 [phone]

## 2014-10-07 NOTE — Discharge Instructions (Signed)
The cause of your leg pain is not clear You were given an injection of medicine to help You were given a refill on the tramadol. Please follow up with Dr[name] for further refills of your tramadol as needed
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
## 2014-11-06 ENCOUNTER — Ambulatory Visit: Payer: Medicaid Other | Attending: Internal Medicine | Admitting: Internal Medicine

## 2014-11-06 ENCOUNTER — Encounter: Payer: Self-pay | Admitting: Internal Medicine

## 2014-11-06 VITALS — BP 108/69 | HR 92 | Temp 98.4°F | Resp 17 | Ht 64.0 in | Wt 97.0 lb

## 2014-11-06 DIAGNOSIS — M79605 Pain in left leg: Secondary | ICD-10-CM | POA: Insufficient documentation

## 2014-11-06 DIAGNOSIS — G43909 Migraine, unspecified, not intractable, without status migrainosus: Secondary | ICD-10-CM | POA: Diagnosis not present

## 2014-11-06 DIAGNOSIS — D259 Leiomyoma of uterus, unspecified: Secondary | ICD-10-CM | POA: Insufficient documentation

## 2014-11-06 DIAGNOSIS — Z79899 Other long term (current) drug therapy: Secondary | ICD-10-CM | POA: Diagnosis not present

## 2014-11-06 DIAGNOSIS — M25552 Pain in left hip: Secondary | ICD-10-CM | POA: Diagnosis not present

## 2014-11-06 DIAGNOSIS — G8929 Other chronic pain: Secondary | ICD-10-CM | POA: Insufficient documentation

## 2014-11-06 DIAGNOSIS — Z09 Encounter for follow-up examination after completed treatment for conditions other than malignant neoplasm: Secondary | ICD-10-CM | POA: Diagnosis present

## 2014-11-06 MED ORDER — TRAMADOL HCL 50 MG PO TABS: 50.0000 mg | ORAL_TABLET | Freq: Two times a day (BID) | ORAL | Status: DC | PRN

## 2014-11-06 NOTE — Patient Instructions (Signed)
Hip Pain Your hip is the joint between your upper legs and your lower pelvis. The bones, cartilage, tendons, and muscles of your hip joint perform a lot of work each day supporting your body weight and allowing you to move around. Hip pain can range from a minor ache to severe pain in one or both of your hips. Pain may be felt on the inside of the hip joint near the groin, or the outside near the buttocks and upper thigh. You may have swelling or stiffness as well.  HOME CARE INSTRUCTIONS   Take medicines only as directed by your health care provider.  Apply ice to the injured area:  Put ice in a plastic bag.  Place a towel between your skin and the bag.  Leave the ice on for 15-20 minutes at a time, 3-4 times a day.  Keep your leg raised (elevated) when possible to lessen swelling.  Avoid activities that cause pain.  Follow specific exercises as directed by your health care provider.  Sleep with a pillow between your legs on your most comfortable side.  Record how often you have hip pain, the location of the pain, and what it feels like. SEEK MEDICAL CARE IF:   You are unable to put weight on your leg.  Your hip is red or swollen or very tender to touch.  Your pain or swelling continues or worsens after 1 week.  You have increasing difficulty walking.  You have a fever. SEEK IMMEDIATE MEDICAL CARE IF:   You have fallen.  You have a sudden increase in pain and swelling in your hip. MAKE SURE YOU:   Understand these instructions.  Will watch your condition.  Will get help right away if you are not doing well or get worse. Document Released: 05/11/2010 Document Revised: 04/07/2014 Document Reviewed: 07/18/2013 Exit[employer] Patient Information 2015 [employer]. This information is not intended to replace advice given to you by your health care provider. Make sure you discuss any questions you have with your health care provider.  

## 2014-11-06 NOTE — Progress Notes (Signed)
Patient ID: Brianna Velez, female   DOB: 03/01/1976, 38 y.o.   MRN: [id number]   Brianna Velez, is a 38 y.o. female  IPJ:[id number]  BHA:[id number]  DOB - 01/04/1976  Chief Complaint  Patient presents with  . Follow-up        Subjective:   Brianna Velez is a 38 y.o. female here today for a follow up visit. Patient has history of migraine headaches, uterine fibroids, and chronic left leg pain. Patient complained of chronic pain in the left leg that comes and goes. She also described a left hip pain that has been ongoing for a long period of time. Requesting a referral to sports medicine for possible intra-articular injection of steroid. She also wants a prescription of tramadol. She has no urinary or bowel incontinence. Patient has No headache, No chest pain, No abdominal pain - No Nausea, No new weakness tingling or numbness, No Cough - SOB.  Problem  Left Hip Pain    ALLERGIES: No Known Allergies  PAST MEDICAL HISTORY: Past Medical History  Diagnosis Date  . Migraine   . Leg pain   . Fibroids     MEDICATIONS AT HOME: Prior to Admission medications   Medication Sig Start Date End Date Taking? Authorizing Provider  Aspirin-Acetaminophen-Caffeine (GOODYS EXTRA STRENGTH PO) Take 1 packet by mouth every 6 (six) hours as needed (pain).   Yes Historical Provider, MD  docusate sodium (COLACE) 100 MG capsule Take 1 capsule (100 mg total) by mouth 2 (two) times daily. 08/07/14  Yes Olam Idler, MD  ondansetron (ZOFRAN ODT) 4 MG disintegrating tablet 4mg  ODT q4 hours prn nausea/vomit 08/10/14  Yes Mariea Clonts, MD  polyethylene glycol [facility] / GLYCOLAX) packet Take 17 g by mouth daily as needed for mild constipation. 08/07/14  Yes Olam Idler, MD  traMADol (ULTRAM) 50 MG tablet Take 1 tablet (50 mg total) by mouth every 12 (twelve) hours as needed for moderate pain. 11/06/14  Yes Tresa Garter, MD  naproxen (NAPROSYN) 500 MG tablet Take 1 tablet (500 mg total) by mouth 2 (two)  times daily. Patient not taking: Reported on 11/06/2014 08/10/14   Mariea Clonts, MD  polyethylene glycol [facility] / Floria Raveling) packet Take 17 g by mouth daily. Patient not taking: Reported on 11/06/2014 08/10/14   Mariea Clonts, MD     Objective:   Filed Vitals:   11/06/14 1555  BP: 108/69  Pulse: 92  Temp: 98.4 F (36.9 C)  TempSrc: Oral  Resp: 17  Height: 5\' 4"  (1.626 m)  Weight: 97 lb (43.999 kg)  SpO2: 100%    Exam General appearance : Awake, alert, not in any distress. Speech Clear. Not toxic looking HEENT: Atraumatic and Normocephalic, pupils equally reactive to light and accomodation Neck: supple, no JVD. No cervical lymphadenopathy.  Chest:Good air entry bilaterally, no added sounds  CVS: S1 S2 regular, no murmurs.  Abdomen: Bowel sounds present, Non tender and not distended with no gaurding, rigidity or rebound. Extremities: B/L Lower Ext shows no edema, both legs are warm to touch. Negative leg raising sign. No obvious swelling of the hip joint. Neurology: Awake alert, and oriented X 3, CN II-XII intact, Non focal Skin:No Rash Wounds:N/A  Data Review No results found for: HGBA1C   Assessment & Plan   1. Left hip pain  - Ambulatory referral to Sports Medicine  - traMADol (ULTRAM) 50 MG tablet; Take 1 tablet (50 mg total) by mouth every 12 (twelve) hours as needed for moderate  pain.  Dispense: 60 tablet; Refill: 0   Patient was counseled extensively about nutrition and exercise   Return in about 6 months (around 05/08/2015) for Follow up Pain and comorbidities.  The patient was given clear instructions to go to ER or return to medical center if symptoms don't improve, worsen or new problems develop. The patient verbalized understanding. The patient was told to call to get lab results if they haven't heard anything in the next week.   This note has been created with Surveyor, quantity. Any transcriptional errors are  unintentional.    Angelica Chessman, MD, [place], [place], [place] and [place], [place]   11/06/2014, 4:42 PM

## 2014-11-06 NOTE — Progress Notes (Signed)
Pt is here following up on her pain in her lower back and left leg. Pt is requesting a referral to sports medicine.

## 2014-11-18 ENCOUNTER — Ambulatory Visit: Payer: Medicaid Other | Admitting: Sports Medicine

## 2014-11-29 ENCOUNTER — Encounter (HOSPITAL_COMMUNITY): Payer: Self-pay | Admitting: Adult Health

## 2014-11-29 ENCOUNTER — Emergency Department (HOSPITAL_COMMUNITY)
Admission: EM | Admit: 2014-11-29 | Discharge: 2014-11-30 | Disposition: A | Discharge status: Home / Self Care | Payer: Medicaid Other | Attending: Emergency Medicine | Admitting: Emergency Medicine

## 2014-11-29 DIAGNOSIS — Z79899 Other long term (current) drug therapy: Secondary | ICD-10-CM | POA: Diagnosis not present

## 2014-11-29 DIAGNOSIS — Z7982 Long term (current) use of aspirin: Secondary | ICD-10-CM | POA: Insufficient documentation

## 2014-11-29 DIAGNOSIS — G43909 Migraine, unspecified, not intractable, without status migrainosus: Secondary | ICD-10-CM | POA: Insufficient documentation

## 2014-11-29 DIAGNOSIS — R110 Nausea: Secondary | ICD-10-CM | POA: Insufficient documentation

## 2014-11-29 DIAGNOSIS — M79606 Pain in leg, unspecified: Secondary | ICD-10-CM | POA: Insufficient documentation

## 2014-11-29 DIAGNOSIS — Z9851 Tubal ligation status: Secondary | ICD-10-CM | POA: Insufficient documentation

## 2014-11-29 DIAGNOSIS — R102 Pelvic and perineal pain: Secondary | ICD-10-CM | POA: Diagnosis not present

## 2014-11-29 DIAGNOSIS — Z3202 Encounter for pregnancy test, result negative: Secondary | ICD-10-CM | POA: Diagnosis not present

## 2014-11-29 DIAGNOSIS — M25552 Pain in left hip: Secondary | ICD-10-CM

## 2014-11-29 DIAGNOSIS — Z87891 Personal history of nicotine dependence: Secondary | ICD-10-CM | POA: Diagnosis not present

## 2014-11-29 DIAGNOSIS — N898 Other specified noninflammatory disorders of vagina: Secondary | ICD-10-CM | POA: Insufficient documentation

## 2014-11-29 LAB — URINE MICROSCOPIC-ADD ON

## 2014-11-29 LAB — URINALYSIS, ROUTINE W REFLEX MICROSCOPIC
Bilirubin Urine: NEGATIVE
Glucose, UA: NEGATIVE mg/dL
Hgb urine dipstick: NEGATIVE
Ketones, ur: NEGATIVE mg/dL
Nitrite: NEGATIVE
Protein, ur: NEGATIVE mg/dL
Specific Gravity, Urine: 1.034 — ABNORMAL HIGH (ref 1.005–1.030)
Urobilinogen, UA: 0.2 mg/dL (ref 0.0–1.0)
pH: 6 (ref 5.0–8.0)

## 2014-11-29 LAB — PREGNANCY, URINE: Preg Test, Ur: NEGATIVE

## 2014-11-29 MED ORDER — OXYCODONE-ACETAMINOPHEN 5-325 MG PO TABS
1.0000 | ORAL_TABLET | Freq: Once | ORAL | Status: AC
  Administered 2014-11-29: 1 via ORAL
  Filled 2014-11-29: qty 1
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
## 2014-11-30 LAB — WET PREP, GENITAL
Clue Cells Wet Prep HPF POC: NONE SEEN
Trich, Wet Prep: NONE SEEN
Yeast Wet Prep HPF POC: NONE SEEN

## 2014-11-30 MED ORDER — TRAMADOL HCL 50 MG PO TABS: 50.0000 mg | ORAL_TABLET | Freq: Two times a day (BID) | ORAL | Status: DC | PRN

## 2014-11-30 MED ORDER — TRAMADOL HCL 50 MG PO TABS
50.0000 mg | ORAL_TABLET | Freq: Once | ORAL | Status: AC
  Administered 2014-11-30: 50 mg via ORAL
  Filled 2014-11-30: qty 1
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
## 2014-11-30 NOTE — ED Notes (Signed)
The pt is c/o pelvic pain for 3 days no previous lmp dec 2nd   Also c;o lt leg pain

## 2014-11-30 NOTE — Discharge Instructions (Signed)
Abdominal Pain, Women °Abdominal (stomach, pelvic, or belly) pain can be caused by many things. It is important to tell your doctor: °· The location of the pain. °· Does it come and go or is it present all the time? °· Are there things that start the pain (eating certain foods, exercise)? °· Are there other symptoms associated with the pain (fever, nausea, vomiting, diarrhea)? °All of this is helpful to know when trying to find the cause of the pain. °CAUSES  °· Stomach: virus or bacteria infection, or ulcer. °· Intestine: appendicitis (inflamed appendix), regional ileitis (Crohn's disease), ulcerative colitis (inflamed colon), irritable bowel syndrome, diverticulitis (inflamed diverticulum of the colon), or cancer of the stomach or intestine. °· Gallbladder disease or stones in the gallbladder. °· Kidney disease, kidney stones, or infection. °· Pancreas infection or cancer. °· Fibromyalgia (pain disorder). °· Diseases of the female organs: °· Uterus: fibroid (non-cancerous) tumors or infection. °· Fallopian tubes: infection or tubal pregnancy. °· Ovary: cysts or tumors. °· Pelvic adhesions (scar tissue). °· Endometriosis (uterus lining tissue growing in the pelvis and on the pelvic organs). °· Pelvic congestion syndrome (female organs filling up with blood just before the menstrual period). °· Pain with the menstrual period. °· Pain with ovulation (producing an egg). °· Pain with an IUD (intrauterine device, birth control) in the uterus. °· Cancer of the female organs. °· Functional pain (pain not caused by a disease, may improve without treatment). °· Psychological pain. °· Depression. °DIAGNOSIS  °Your doctor will decide the seriousness of your pain by doing an examination. °· Blood tests. °· X-rays. °· Ultrasound. °· CT scan (computed tomography, special type of X-ray). °· MRI (magnetic resonance imaging). °· Cultures, for infection. °· Barium enema (dye inserted in the large intestine, to better view it with  X-rays). °· Colonoscopy (looking in intestine with a lighted tube). °· Laparoscopy (minor surgery, looking in abdomen with a lighted tube). °· Major abdominal exploratory surgery (looking in abdomen with a large incision). °TREATMENT  °The treatment will depend on the cause of the pain.  °· Many cases can be observed and treated at home. °· Over-the-counter medicines recommended by your caregiver. °· Prescription medicine. °· Antibiotics, for infection. °· Birth control pills, for painful periods or for ovulation pain. °· Hormone treatment, for endometriosis. °· Nerve blocking injections. °· Physical therapy. °· Antidepressants. °· Counseling with a psychologist or psychiatrist. °· Minor or major surgery. °HOME CARE INSTRUCTIONS  °· Do not take laxatives, unless directed by your caregiver. °· Take over-the-counter pain medicine only if ordered by your caregiver. Do not take aspirin because it can cause an upset stomach or bleeding. °· Try a clear liquid diet (broth or water) as ordered by your caregiver. Slowly move to a bland diet, as tolerated, if the pain is related to the stomach or intestine. °· Have a thermometer and take your temperature several times a day, and record it. °· Bed rest and sleep, if it helps the pain. °· Avoid sexual intercourse, if it causes pain. °· Avoid stressful situations. °· Keep your follow-up appointments and tests, as your caregiver orders. °· If the pain does not go away with medicine or surgery, you may try: °· Acupuncture. °· Relaxation exercises (yoga, meditation). °· Group therapy. °· Counseling. °SEEK MEDICAL CARE IF:  °· You notice certain foods cause stomach pain. °· Your home care treatment is not helping your pain. °· You need stronger pain medicine. °· You want your IUD removed. °· You feel faint or   lightheaded.  You develop nausea and vomiting.  You develop a rash.  You are having side effects or an allergy to your medicine. SEEK IMMEDIATE MEDICAL CARE IF:   Your  pain does not go away or gets worse.  You have a fever.  Your pain is felt only in portions of the abdomen. The right side could possibly be appendicitis. The left lower portion of the abdomen could be colitis or diverticulitis.  You are passing blood in your stools (bright red or black tarry stools, with or without vomiting).  You have blood in your urine.  You develop chills, with or without a fever.  You pass out. MAKE SURE YOU:   Understand these instructions.  Will watch your condition.  Will get help right away if you are not doing well or get worse. Document Released: 09/18/2007 Document Revised: 04/07/2014 Document Reviewed: 10/08/2009 [facility] Patient Information 2015 [place], [place]. This information is not intended to replace advice given to you by your health care provider. Make sure you discuss any questions you have with your health care provider.    Pelvic Pain Female pelvic pain can be caused by many different things and start from a variety of places. Pelvic pain refers to pain that is located in the lower half of the abdomen and between your hips. The pain may occur over a short period of time (acute) or may be reoccurring (chronic). The cause of pelvic pain may be related to disorders affecting the female reproductive organs (gynecologic), but it may also be related to the bladder, kidney stones, an intestinal complication, or muscle or skeletal problems. Getting help right away for pelvic pain is important, especially if there has been severe, sharp, or a sudden onset of unusual pain. It is also important to get help right away because some types of pelvic pain can be life threatening.  CAUSES  Below are only some of the causes of pelvic pain. The causes of pelvic pain can be in one of several categories.   Gynecologic.  Pelvic inflammatory disease.  Sexually transmitted infection.  Ovarian cyst or a twisted ovarian ligament (ovarian torsion).  Uterine lining  that grows outside the uterus (endometriosis).  Fibroids, cysts, or tumors.  Ovulation.  Pregnancy.  Pregnancy that occurs outside the uterus (ectopic pregnancy).  Miscarriage.  Labor.  Abruption of the placenta or ruptured uterus.  Infection.  Uterine infection (endometritis).  Bladder infection.  Diverticulitis.  Miscarriage related to a uterine infection (septic abortion).  Bladder.  Inflammation of the bladder (cystitis).  Kidney stone(s).  Gastrointestinal.  Constipation.  Diverticulitis.  Neurologic.  Trauma.  Feeling pelvic pain because of mental or emotional causes (psychosomatic).  Cancers of the bowel or pelvis. EVALUATION  Your caregiver will want to take a careful history of your concerns. This includes recent changes in your health, a careful gynecologic history of your periods (menses), and a sexual history. Obtaining your family history and medical history is also important. Your caregiver may suggest a pelvic exam. A pelvic exam will help identify the location and severity of the pain. It also helps in the evaluation of which organ system may be involved. In order to identify the cause of the pelvic pain and be properly treated, your caregiver may order tests. These tests may include:   A pregnancy test.  Pelvic ultrasonography.  An X-ray exam of the abdomen.  A urinalysis or evaluation of vaginal discharge.  Blood tests. HOME CARE INSTRUCTIONS   Only take over-the-counter or prescription  medicines for pain, discomfort, or fever as directed by your caregiver.   Rest as directed by your caregiver.   Eat a balanced diet.   Drink enough fluids to make your urine clear or pale yellow, or as directed.   Avoid sexual intercourse if it causes pain.   Apply warm or cold compresses to the lower abdomen depending on which one helps the pain.   Avoid stressful situations.   Keep a journal of your pelvic pain. Write down when it  started, where the pain is located, and if there are things that seem to be associated with the pain, such as food or your menstrual cycle.  Follow up with your caregiver as directed.  SEEK MEDICAL CARE IF:  Your medicine does not help your pain.  You have abnormal vaginal discharge. SEEK IMMEDIATE MEDICAL CARE IF:   You have heavy bleeding from the vagina.   Your pelvic pain increases.   You feel light-headed or faint.   You have chills.   You have pain with urination or blood in your urine.   You have uncontrolled diarrhea or vomiting.   You have a fever or persistent symptoms for more than 3 days.  You have a fever and your symptoms suddenly get worse.   You are being physically or sexually abused.  MAKE SURE YOU:  Understand these instructions.  Will watch your condition.  Will get help if you are not doing well or get worse. Document Released: 10/18/2004 Document Revised: 04/07/2014 Document Reviewed: 03/12/2012 [facility] Patient Information 2015 [place], [place]. This information is not intended to replace advice given to you by your health care provider. Make sure you discuss any questions you have with your health care provider.   Chronic Pain Chronic pain can be defined as pain that is off and on and lasts for 3-6 months or longer. Many things cause chronic pain, which can make it difficult to make a diagnosis. There are many treatment options available for chronic pain. However, finding a treatment that works well for you may require trying various approaches until the right one is found. Many people benefit from a combination of two or more types of treatment to control their pain. SYMPTOMS  Chronic pain can occur anywhere in the body and can range from mild to very severe. Some types of chronic pain include:  Headache.  Low back pain.  Cancer pain.  Arthritis pain.  Neurogenic pain. This is pain resulting from damage to nerves. People with  chronic pain may also have other symptoms such as:  Depression.  Anger.  Insomnia.  Anxiety. DIAGNOSIS  Your health care provider will help diagnose your condition over time. In many cases, the initial focus will be on excluding possible conditions that could be causing the pain. Depending on your symptoms, your health care provider may order tests to diagnose your condition. Some of these tests may include:   Blood tests.   CT scan.   MRI.   X-rays.   Ultrasounds.   Nerve conduction studies.  You may need to see a specialist.  TREATMENT  Finding treatment that works well may take time. You may be referred to a pain specialist. He or she may prescribe medicine or therapies, such as:   Mindful meditation or yoga.  Shots (injections) of numbing or pain-relieving medicines into the spine or area of pain.  Local electrical stimulation.  Acupuncture.   Massage therapy.   Aroma, color, light, or sound therapy.   Biofeedback.  Working with a physical therapist to keep from getting stiff.   Regular, gentle exercise.   Cognitive or behavioral therapy.   Group support.  Sometimes, surgery may be recommended.  HOME CARE INSTRUCTIONS   Take all medicines as directed by your health care provider.   Lessen stress in your life by relaxing and doing things such as listening to calming music.   Exercise or be active as directed by your health care provider.   Eat a healthy diet and include things such as vegetables, fruits, fish, and lean meats in your diet.   Keep all follow-up appointments with your health care provider.   Attend a support group with others suffering from chronic pain. SEEK MEDICAL CARE IF:   Your pain gets worse.   You develop a new pain that was not there before.   You cannot tolerate medicines given to you by your health care provider.   You have new symptoms since your last visit with your health care provider.  SEEK  IMMEDIATE MEDICAL CARE IF:   You feel weak.   You have decreased sensation or numbness.   You lose control of bowel or bladder function.   Your pain suddenly gets much worse.   You develop shaking.  You develop chills.  You develop confusion.  You develop chest pain.  You develop shortness of breath.  MAKE SURE YOU:  Understand these instructions.  Will watch your condition.  Will get help right away if you are not doing well or get worse. Document Released: 08/13/2002 Document Revised: 07/24/2013 Document Reviewed: 05/17/2013 [facility] Patient Information 2015 [place], [place]. This information is not intended to replace advice given to you by your health care provider. Make sure you discuss any questions you have with your health care provider.

## 2014-12-01 LAB — GC/CHLAMYDIA PROBE AMP
CT Probe RNA: NEGATIVE
GC Probe RNA: NEGATIVE

## 2014-12-01 LAB — URINE CULTURE
Colony Count: NO GROWTH
Culture: NO GROWTH

## 2015-01-21 ENCOUNTER — Encounter: Payer: Self-pay | Admitting: Obstetrics & Gynecology

## 2015-01-21 ENCOUNTER — Ambulatory Visit (INDEPENDENT_AMBULATORY_CARE_PROVIDER_SITE_OTHER): Payer: Medicaid Other | Admitting: Obstetrics & Gynecology

## 2015-01-21 VITALS — BP 99/62 | HR 90 | Temp 98.1°F | Ht 64.0 in | Wt 94.5 lb

## 2015-01-21 DIAGNOSIS — D259 Leiomyoma of uterus, unspecified: Secondary | ICD-10-CM

## 2015-01-21 DIAGNOSIS — M25552 Pain in left hip: Secondary | ICD-10-CM

## 2015-01-21 DIAGNOSIS — R634 Abnormal weight loss: Secondary | ICD-10-CM

## 2015-01-21 LAB — CBC
HCT: 30.4 % — ABNORMAL LOW (ref 36.0–46.0)
Hemoglobin: 9 g/dL — ABNORMAL LOW (ref 12.0–15.0)
MCH: 23.6 pg — ABNORMAL LOW (ref 26.0–34.0)
MCHC: 29.6 g/dL — ABNORMAL LOW (ref 30.0–36.0)
MCV: 79.6 fL (ref 78.0–100.0)
MPV: 10.3 fL (ref 8.6–12.4)
Platelets: 305 10*3/uL (ref 150–400)
RBC: 3.82 MIL/uL — ABNORMAL LOW (ref 3.87–5.11)
RDW: 16.3 % — ABNORMAL HIGH (ref 11.5–15.5)
WBC: 3.6 10*3/uL — ABNORMAL LOW (ref 4.0–10.5)

## 2015-01-21 MED ORDER — TRAMADOL HCL 50 MG PO TABS: 50.0000 mg | ORAL_TABLET | Freq: Two times a day (BID) | ORAL | Status: DC | PRN

## 2015-01-21 NOTE — Progress Notes (Signed)
Subjective:     Patient ID: Brianna Velez, female   DOB: 07/18/1976, 39 y.o.   MRN: [id number]  HPI Pt reports pain and heavy bleeding.  She was on OCPs briefly but, did not like the side effects.  Pt reports that she is overflowing her pads on her menses.  Pt reports that she wants treatment but reports that she initially wanted to maintain her fertility- she has had a BTL.  She has been sexually active for 11 years with no protection and no pregnancies. Pt reports decreased weight loss since last visit.  She reports decreased appetite.  She reports that her blood was last tested 'last year' and she had an HIV test done last 2 years prev.    Past Medical History  Diagnosis Date  . Migraine   . Leg pain   . Fibroids    Past Surgical History  Procedure Laterality Date  . Tubal ligation     Current Outpatient Prescriptions on File Prior to Visit  Medication Sig Dispense Refill  . docusate sodium (COLACE) 100 MG capsule Take 1 capsule (100 mg total) by mouth 2 (two) times daily. 60 capsule 0  . polyethylene glycol (MIRALAX / GLYCOLAX) packet Take 17 g by mouth daily. 7 each 0   No current facility-administered medications on file prior to visit.       Review of Systems     Objective:   Physical Exam BP 99/62 mmHg  Pulse 90  Temp(Src) 98.1 F (36.7 C) (Oral)  Ht 5\' 4"  (1.626 m)  Wt 94 lb 8 oz (42.865 kg)  BMI 16.21 kg/m2  LMP 12/26/2014 (Approximate) Abd: soft, NT, ND GU: EGBUS: no lesions Vagina: no blood in vault Cervix: no lesion; no mucopurulent d/c Uterus: small, mobile Adnexa: no masses; sl tender      05/07/2014 CLINICAL DATA: Prolonged bleeding, pelvic pain.  EXAM: TRANSABDOMINAL AND TRANSVAGINAL ULTRASOUND OF PELVIS  TECHNIQUE: Both transabdominal and transvaginal ultrasound examinations of the pelvis were performed. Transabdominal technique was performed for global imaging of the pelvis including uterus, ovaries, adnexal regions, and pelvic cul-de-sac.  It was necessary to proceed with endovaginal exam following the transabdominal exam to visualize the endometrium and right ovary.  COMPARISON: None  FINDINGS: Uterus  Measurements: 11.7 x 7.5 x 9.4 cm. Multiple fibroids noted. Left intramural fibroid is the largest, measuring 4.5 x 3.6 x 3.5 cm. Exophytic subserosal fundal fibroid measures 3.3 x 2.2 x 2.1 cm. Fundal intramural fibroid measures 3.9 x 3.7 x 3.3 cm.  Endometrium  Thickness: 5 mm. No focal abnormality visualized.  Right ovary  Measurements: 2.9 x 1.4 x 1.7 cm. Normal appearance/no adnexal mass.  Left ovary  Measurements: 2.6 x 1.5 x 2.0 cm. Normal appearance/no adnexal mass.  Other findings  No free fluid.  IMPRESSION: Enlarged fibroid uterus.    Assessment:     symptomatic fibroid uterus. Reviewed with pt her options including g RATH , LAVH, TAH, UFE.  I explained to her that a myomectomy is an option but, that I would have to refer her to a provider that would agree to it given her history.    Weight loss- undetermined significance      Plan:    labs: CBC, TSH and HIV  Ultram 50mg  po q 6 hours prn pain. Patient desires surgical management with [facility] with bilateral salpingectomy.  The risks of surgery were discussed in detail with the patient including but not limited to: bleeding which may require transfusion or reoperation; infection which  may require prolonged hospitalization or re-hospitalization and antibiotic therapy; injury to bowel, bladder, ureters and major vessels or other surrounding organs; need for additional procedures including laparotomy; thromboembolic phenomenon, incisional problems and other postoperative or anesthesia complications.  Patient was told that the likelihood that her condition and symptoms will be treated effectively with this surgical management was very high; the postoperative expectations were also discussed in detail. The patient also understands the alternative  treatment options which were discussed in full. All questions were answered.  She was told that she will be contacted by our surgical scheduler regarding the time and date of her surgery; routine preoperative instructions of having nothing to eat or drink after midnight on the day prior to surgery and also coming to the hospital 1 1/2 hours prior to her time of surgery were also emphasized.  She was told she may be called for a preoperative appointment about a week prior to surgery and will be given further preoperative instructions at that visit. Printed patient education handouts about the procedure were given to the patient to review at home.

## 2015-01-21 NOTE — Patient Instructions (Signed)
Laparoscopically Assisted Vaginal Hysterectomy  A laparoscopically assisted vaginal hysterectomy (LAVH) is a surgical procedure to remove the uterus and cervix, and sometimes the ovaries and fallopian tubes. During an LAVH, some of the surgical removal is done through the vagina, and the rest is done through a few small surgical cuts (incisions) in the abdomen.  This procedure is usually considered in women when a vaginal hysterectomy is not an option. Your health care provider will discuss the risks and benefits of the different surgical techniques at your appointment. Generally, recovery time is faster and there are fewer complications after laparoscopic procedures than after open incisional procedures. LET YOUR HEALTH CARE PROVIDER KNOW ABOUT:   Any allergies you have.  All medicines you are taking, including vitamins, herbs, eye drops, creams, and over-the-counter medicines.  Previous problems you or members of your family have had with the use of anesthetics.  Any blood disorders you have.  Previous surgeries you have had.  Medical conditions you have. RISKS AND COMPLICATIONS Generally, this is a safe procedure. However, as with any procedure, complications can occur. Possible complications include:  Allergies to medicines.  Difficulty breathing.  Bleeding.  Infection.  Damage to other structures near your uterus and cervix. BEFORE THE PROCEDURE  Ask your health care provider about changing or stopping your regular medicines.  Take certain medicines, such as a colon-emptying preparation, as directed.  Do not eat or drink anything for at least 8 hours before your surgery.  Stop smoking if you smoke. Stopping will improve your health after surgery.  Arrange for a ride home after surgery and for help at home during recovery. PROCEDURE   An IV tube will be put into one of your veins in order to give you fluids and medicines.  You will receive medicines to relax you and  medicines that make you sleep (general anesthetic).  You may have a flexible tube (catheter) put into your bladder to drain urine.  You may have a tube put through your nose or mouth that goes into your stomach (nasogastric tube). The nasogastric tube removes digestive fluids and prevents you from feeling nauseated and from vomiting.  Tight-fitting (compression) stockings will be placed on your legs to promote circulation.  Three to four small incisions will be made in your abdomen. An incision also will be made in your vagina. Probes and tools will be inserted into the small incisions. The uterus and cervix are removed (and possibly your ovaries and fallopian tubes) through your vagina as well as through the small incisions that were made in the abdomen.  Your vagina is then sewn back to normal. AFTER THE PROCEDURE  You may have a liquid diet temporarily. You will most likely return to, and tolerate, your usual diet the day after surgery.  You will be passing urine through a catheter. It will be removed the day after surgery.  Your temperature, breathing rate, heart rate, blood pressure, and oxygen level will be monitored regularly.  You will still wear compression stockings on your legs until you are able to move around.  You will use a special device or do breathing exercises to keep your lungs clear.  You will be encouraged to walk as soon as possible. Document Released: 11/10/2011 Document Revised: 07/24/2013 Document Reviewed: 06/06/2013 [employer] Patient Information 2015 [employer]. This information is not intended to replace advice given to you by your health care provider. Make sure you discuss any questions you have with your health care provider.  

## 2015-01-22 ENCOUNTER — Telehealth: Payer: Self-pay

## 2015-01-22 LAB — TSH: TSH: 0.631 u[IU]/mL (ref 0.350–4.500)

## 2015-01-22 LAB — HIV ANTIBODY (ROUTINE TESTING W REFLEX): HIV 1&2 Ab, 4th Generation: NONREACTIVE

## 2015-01-22 NOTE — Telephone Encounter (Addendum)
Attempted to contact patient. No answer. Left message stating we are calling with results, please call clinic.   2/18  [phone]  Called pt again and stated that we are calling with important information from the doctor. Please call back and leave message stating whether a detailed message can be left on her voice mail.  Diane Day RNC

## 2015-01-22 NOTE — Telephone Encounter (Signed)
Patient returned call. Called patient and informed her of results and recommendation. Advised she pick up an OTC iron supplement and take 3X daily. Informed her it may cause constipation-- advised she take with orange juice, stay well hydrated, and eat foods high in fiber. Patient verbalized understanding and gratitude. No questions or concerns.

## 2015-01-22 NOTE — Telephone Encounter (Signed)
-----   Message from Lavonia Drafts, MD sent at 01/22/2015 10:57 AM EST ----- Please call pt. She should take FeSO4 OTC tid until surgery. Also, let her know tha there TSH and HIV were both neg (normal)    Thx , clh-S
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
## 2015-03-05 ENCOUNTER — Emergency Department (HOSPITAL_COMMUNITY)
Admission: EM | Admit: 2015-03-05 | Discharge: 2015-03-05 | Disposition: A | Discharge status: Home / Self Care | Payer: Self-pay | Attending: Emergency Medicine | Admitting: Emergency Medicine

## 2015-03-05 ENCOUNTER — Encounter (HOSPITAL_COMMUNITY): Payer: Self-pay

## 2015-03-05 ENCOUNTER — Telehealth: Payer: Self-pay | Admitting: Obstetrics & Gynecology

## 2015-03-05 DIAGNOSIS — Z8742 Personal history of other diseases of the female genital tract: Secondary | ICD-10-CM | POA: Insufficient documentation

## 2015-03-05 DIAGNOSIS — R102 Pelvic and perineal pain: Secondary | ICD-10-CM | POA: Insufficient documentation

## 2015-03-05 DIAGNOSIS — G43909 Migraine, unspecified, not intractable, without status migrainosus: Secondary | ICD-10-CM | POA: Insufficient documentation

## 2015-03-05 DIAGNOSIS — G8929 Other chronic pain: Secondary | ICD-10-CM | POA: Insufficient documentation

## 2015-03-05 DIAGNOSIS — Z3202 Encounter for pregnancy test, result negative: Secondary | ICD-10-CM | POA: Insufficient documentation

## 2015-03-05 DIAGNOSIS — Z87891 Personal history of nicotine dependence: Secondary | ICD-10-CM | POA: Insufficient documentation

## 2015-03-05 LAB — WET PREP, GENITAL
Trich, Wet Prep: NONE SEEN
Yeast Wet Prep HPF POC: NONE SEEN

## 2015-03-05 LAB — GC/CHLAMYDIA PROBE AMP (~~LOC~~) NOT AT ARMC
Chlamydia: NEGATIVE
Neisseria Gonorrhea: NEGATIVE

## 2015-03-05 LAB — POC URINE PREG, ED: Preg Test, Ur: NEGATIVE

## 2015-03-05 MED ORDER — OXYCODONE-ACETAMINOPHEN 5-325 MG PO TABS
2.0000 | ORAL_TABLET | Freq: Once | ORAL | Status: AC
  Administered 2015-03-05: 2 via ORAL
  Filled 2015-03-05: qty 2

## 2015-03-05 MED ORDER — TRAMADOL HCL 50 MG PO TABS: 50.0000 mg | ORAL_TABLET | Freq: Four times a day (QID) | ORAL | Status: DC | PRN

## 2015-03-05 MED ORDER — TRAMADOL HCL 50 MG PO TABS: 50.0000 mg | ORAL_TABLET | Freq: Two times a day (BID) | ORAL | Status: DC | PRN

## 2015-03-05 NOTE — Discharge Instructions (Signed)
Pelvic Pain Female pelvic pain can be caused by many different things and start from a variety of places. Pelvic pain refers to pain that is located in the lower half of the abdomen and between your hips. The pain may occur over a short period of time (acute) or may be reoccurring (chronic). The cause of pelvic pain may be related to disorders affecting the female reproductive organs (gynecologic), but it may also be related to the bladder, kidney stones, an intestinal complication, or muscle or skeletal problems. Getting help right away for pelvic pain is important, especially if there has been severe, sharp, or a sudden onset of unusual pain. It is also important to get help right away because some types of pelvic pain can be life threatening.  CAUSES  Below are only some of the causes of pelvic pain. The causes of pelvic pain can be in one of several categories.   Gynecologic.  Pelvic inflammatory disease.  Sexually transmitted infection.  Ovarian cyst or a twisted ovarian ligament (ovarian torsion).  Uterine lining that grows outside the uterus (endometriosis).  Fibroids, cysts, or tumors.  Ovulation.  Pregnancy.  Pregnancy that occurs outside the uterus (ectopic pregnancy).  Miscarriage.  Labor.  Abruption of the placenta or ruptured uterus.  Infection.  Uterine infection (endometritis).  Bladder infection.  Diverticulitis.  Miscarriage related to a uterine infection (septic abortion).  Bladder.  Inflammation of the bladder (cystitis).  Kidney stone(s).  Gastrointestinal.  Constipation.  Diverticulitis.  Neurologic.  Trauma.  Feeling pelvic pain because of mental or emotional causes (psychosomatic).  Cancers of the bowel or pelvis. EVALUATION  Your caregiver will want to take a careful history of your concerns. This includes recent changes in your health, a careful gynecologic history of your periods (menses), and a sexual history. Obtaining your family  history and medical history is also important. Your caregiver may suggest a pelvic exam. A pelvic exam will help identify the location and severity of the pain. It also helps in the evaluation of which organ system may be involved. In order to identify the cause of the pelvic pain and be properly treated, your caregiver may order tests. These tests may include:   A pregnancy test.  Pelvic ultrasonography.  An X-ray exam of the abdomen.  A urinalysis or evaluation of vaginal discharge.  Blood tests. HOME CARE INSTRUCTIONS   Only take over-the-counter or prescription medicines for pain, discomfort, or fever as directed by your caregiver.   Rest as directed by your caregiver.   Eat a balanced diet.   Drink enough fluids to make your urine clear or pale yellow, or as directed.   Avoid sexual intercourse if it causes pain.   Apply warm or cold compresses to the lower abdomen depending on which one helps the pain.   Avoid stressful situations.   Keep a journal of your pelvic pain. Write down when it started, where the pain is located, and if there are things that seem to be associated with the pain, such as food or your menstrual cycle.  Follow up with your caregiver as directed.  SEEK MEDICAL CARE IF:  Your medicine does not help your pain.  You have abnormal vaginal discharge. SEEK IMMEDIATE MEDICAL CARE IF:   You have heavy bleeding from the vagina.   Your pelvic pain increases.   You feel light-headed or faint.   You have chills.   You have pain with urination or blood in your urine.   You have uncontrolled diarrhea   or vomiting.   You have a fever or persistent symptoms for more than 3 days.  You have a fever and your symptoms suddenly get worse.   You are being physically or sexually abused.  MAKE SURE YOU:  Understand these instructions.  Will watch your condition.  Will get help if you are not doing well or get worse. Document Released:  10/18/2004 Document Revised: 04/07/2014 Document Reviewed: 03/12/2012 ExitCare Patient Information 2015 [employer]. This information is not intended to replace advice given to you by your health care provider. Make sure you discuss any questions you have with your health care provider.  

## 2015-03-05 NOTE — ED Notes (Signed)
Pt states she started having severe pelvic pain at 1400 on yesterday afternoon; denies vomiting but states some nausea; pt states hx of fibroids with scheduled surgery for March 17, 2015. Pt c/o of pain 10/10.

## 2015-03-05 NOTE — Telephone Encounter (Signed)
Pt spoke to ofc secretary,Marni, and told her that she may not be able to have surgery and wanted to discuss other options.  When I called pt she said that she was nervous but, wanted to proceed with surgery.  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  I informed her that with her job it would be about 4 weeks possibly 6 weeks that she could be out of work.  She said it was no problem and that she wants to proceed with [facility] with bilateral salpingectomy. All of her questions were answered.  Moritz Lever L. Harraway-Smith, M.D., [place] June

## 2015-03-07 ENCOUNTER — Encounter (HOSPITAL_COMMUNITY): Payer: Self-pay

## 2015-03-07 ENCOUNTER — Emergency Department (HOSPITAL_COMMUNITY)
Admission: EM | Admit: 2015-03-07 | Discharge: 2015-03-07 | Disposition: A | Discharge status: Home / Self Care | Payer: Self-pay | Attending: Emergency Medicine | Admitting: Emergency Medicine

## 2015-03-07 ENCOUNTER — Emergency Department (HOSPITAL_COMMUNITY): Payer: Self-pay

## 2015-03-07 DIAGNOSIS — M79671 Pain in right foot: Secondary | ICD-10-CM | POA: Insufficient documentation

## 2015-03-07 DIAGNOSIS — Z8742 Personal history of other diseases of the female genital tract: Secondary | ICD-10-CM | POA: Insufficient documentation

## 2015-03-07 DIAGNOSIS — Z79899 Other long term (current) drug therapy: Secondary | ICD-10-CM | POA: Insufficient documentation

## 2015-03-07 DIAGNOSIS — G43909 Migraine, unspecified, not intractable, without status migrainosus: Secondary | ICD-10-CM | POA: Insufficient documentation

## 2015-03-07 DIAGNOSIS — Z87891 Personal history of nicotine dependence: Secondary | ICD-10-CM | POA: Insufficient documentation

## 2015-03-07 MED ORDER — TRAMADOL HCL 50 MG PO TABS
100.0000 mg | ORAL_TABLET | Freq: Once | ORAL | Status: AC
  Administered 2015-03-07: 100 mg via ORAL
  Filled 2015-03-07: qty 2

## 2015-03-07 MED ORDER — ONDANSETRON 4 MG PO TBDP
4.0000 mg | ORAL_TABLET | Freq: Once | ORAL | Status: AC
  Administered 2015-03-07: 4 mg via ORAL
  Filled 2015-03-07: qty 1

## 2015-03-07 MED ORDER — CYCLOBENZAPRINE HCL 5 MG PO TABS: 5.0000 mg | ORAL_TABLET | Freq: Two times a day (BID) | ORAL | Status: DC | PRN

## 2015-03-07 MED ORDER — TRAMADOL HCL 50 MG PO TABS: 50.0000 mg | ORAL_TABLET | Freq: Four times a day (QID) | ORAL | Status: DC | PRN

## 2015-03-07 NOTE — Discharge Instructions (Signed)

## 2015-03-07 NOTE — ED Notes (Signed)
Onset yesterday upon awakening pt c;/o left foot pain near great toe.  No known injuries.

## 2015-03-07 NOTE — ED Provider Notes (Signed)
CSN: [id number]     Arrival date & time 03/07/15  1512 History  This chart was scribed for Delos Haring, PA-C, working with [name], MD by Steva Colder, ED Scribe. The patient was seen in room TR05C/TR05C at 6:37 PM.    Chief Complaint  Patient presents with  . Foot Pain      The history is provided by the patient. No language interpreter was used.    HPI Comments: Brianna Velez is a 39 y.o. female with a medical hx of leg pain who presents to the Emergency Department complaining of left foot pain onset yesterday. Pt got out of the bed yesterday morning and began to stretch and she felt the pain then. Pt notes that she is having her pain near her bottom of her big toe on her left foot. Pt denies any recent injury. Pt pain is the worse when she is up and walking on the foot. She states that she is having associated symptoms of joint swelling. She states that she has tried ibuprofen and tylenol with mild relief for her symptoms. Pt notes that her symptoms come back after the medications wears off. She denies redness and any other symptoms. Pt works in housekeeping. Pt is not allergic to any medications. Pt does have a PCP.    Past Medical History  Diagnosis Date  . Migraine   . Leg pain   . Fibroids    Past Surgical History  Procedure Laterality Date  . Tubal ligation     Family History  Problem Relation Age of Onset  . Diabetes Sister   . Heart disease Paternal Grandmother    History  Substance Use Topics  . Smoking status: Former Research scientist (life sciences)  . Smokeless tobacco: Never Used  . Alcohol Use: No   OB History    Gravida Para Term Preterm AB TAB SAB Ectopic Multiple Living   2 2 2  0 0 0 0 0 0 2     Review of Systems  Musculoskeletal: Positive for joint swelling and arthralgias.  Skin: Negative for color change.      Allergies  Review of patient's allergies indicates no known allergies.  Home Medications   Prior to Admission medications   Medication Sig Start Date  End Date Taking? Authorizing Provider  cyclobenzaprine (FLEXERIL) 5 MG tablet Take 1 tablet (5 mg total) by mouth 2 (two) times daily as needed for muscle spasms. 03/07/15   Haden Suder Carlota Raspberry, PA-C  docusate sodium (COLACE) 100 MG capsule Take 1 capsule (100 mg total) by mouth 2 (two) times daily. Patient not taking: Reported on 03/04/2015 08/07/14   Olam Idler, MD  polyethylene glycol [facility] / Floria Raveling) packet Take 17 g by mouth daily. Patient not taking: Reported on 03/04/2015 08/10/14   Elnora Morrison, MD  traMADol (ULTRAM) 50 MG tablet Take 1 tablet (50 mg total) by mouth every 6 (six) hours as needed. 03/05/15   Ernestina Patches, MD  traMADol (ULTRAM) 50 MG tablet Take 1 tablet (50 mg total) by mouth every 6 (six) hours as needed. 03/07/15   Jas Betten Carlota Raspberry, PA-C   BP 106/63 mmHg  Pulse 84  Temp(Src) 97.9 F (36.6 C) (Oral)  Resp 18  Wt 92 lb (41.731 kg)  SpO2 100%  LMP 02/27/2015 (Approximate)  Physical Exam  Constitutional: She is oriented to person, place, and time. She appears well-developed and well-nourished. No distress.  HENT:  Head: Normocephalic and atraumatic.  Eyes: EOM are normal.  Neck: Neck supple. No tracheal deviation present.  Cardiovascular: Normal rate.   Pulmonary/Chest: Effort normal. No respiratory distress.  Musculoskeletal: Normal range of motion.       Right foot: There is tenderness, bony tenderness and swelling (minimal). There is normal range of motion, normal capillary refill, no crepitus, no deformity and no laceration.  Pt has intact ROM of great toe without discomfort. NO foreign body appreciated. NO induration or erythema noted.  Neurological: She is alert and oriented to person, place, and time.  Skin: Skin is warm and dry.  Psychiatric: She has a normal mood and affect. Her behavior is normal.  Nursing note and vitals reviewed.   ED Course  Procedures (including critical care time) DIAGNOSTIC STUDIES: Oxygen Saturation is 100% on RA, normal by my  interpretation.    COORDINATION OF CARE: 6:44 PM-Discussed treatment plan which includes left foot X-ray, anti-inflammatory medication, pain medications, ice the foot, ace-wrap, crutches, and f/u with PCP next week with pt at bedside and pt agreed to plan.   Labs Review Labs Reviewed - No data to display  Imaging Review Dg Foot Complete Left  03/07/2015   CLINICAL DATA:  Patient woke up yesterday with plantar surface pain, near the great toe, no injury, initial encounter.  EXAM: LEFT FOOT - COMPLETE 3+ VIEW  COMPARISON:  None.  FINDINGS: Mild hallux valgus. No acute osseous or joint abnormality. No degenerative changes.  IMPRESSION: No acute findings.   Electronically Signed   By: Lorin Picket M.D.   On: 03/07/2015 18:20     EKG Interpretation None      MDM   Final diagnoses:  Right foot pain   Crutches, ice, ace wrap. Pt to [facility]. Referral to Ortho. No acute abnormality seen on xray.   No signs of FB or infection.  39 y.o.Brianna Velez's evaluation in the Emergency Department is complete. It has been determined that no acute conditions requiring further emergency intervention are present at this time. The patient/guardian have been advised of the diagnosis and plan. We have discussed signs and symptoms that warrant return to the ED, such as changes or worsening in symptoms.  Vital signs are stable at discharge. Filed Vitals:   03/07/15 1522  BP: 106/63  Pulse: 84  Temp: 97.9 F (36.6 C)  Resp: 18    Patient/guardian has voiced understanding and agreed to follow-up with the PCP or specialist.  I personally performed the services described in this documentation, which was scribed in my presence. The recorded information has been reviewed and is accurate.   Delos Haring, PA-C 03/07/15 [place], MD 03/08/15 6967

## 2015-03-07 NOTE — ED Notes (Signed)
Declined W/C at D/C and was escorted to lobby by RN. 

## 2015-03-09 ENCOUNTER — Telehealth: Payer: Self-pay | Admitting: Internal Medicine

## 2015-03-09 NOTE — Telephone Encounter (Signed)
Patient called to get some advice on her foot pain, patient thinks that she may have GOUT but can not wait until her PCP's next appointment,please f/u with pt.

## 2015-03-10 MED ORDER — DEXTROSE 5 % IV SOLN: 2.0000 g | INTRAVENOUS | Status: DC

## 2015-03-10 MED ORDER — LACTATED RINGERS IV SOLN: INTRAVENOUS | Status: DC

## 2015-03-10 NOTE — Patient Instructions (Signed)
   Your procedure is scheduled on:  Tuesday, April 12 Enter through the [facility] at: 11:30 AM Pick up the phone at the desk and dial [phone] and inform us of your arrival.  Please call this number if you have any problems the morning of surgery: [phone]  Remember: Do not eat food after midnight: Monday Do not drink clear liquids after: 9 AM Tuesday, day of surgery Take these medicines the morning of surgery with a SIP OF WATER:  Do not wear jewelry, make-up, or FINGER nail polish No metal in your hair or on your body. Do not wear lotions, powders, perfumes.  You may wear deodorant.  Do not bring valuables to the hospital. Contacts, dentures or bridgework may not be worn into surgery.  Leave suitcase in the car. After Surgery it may be brought to your room. For patients being admitted to the hospital, checkout time is 11:00am the day of discharge.

## 2015-03-11 ENCOUNTER — Encounter (HOSPITAL_COMMUNITY)
Admission: RE | Admit: 2015-03-11 | Discharge: 2015-03-11 | Disposition: A | Discharge status: Home / Self Care | Payer: Self-pay | Source: Ambulatory Visit | Attending: Obstetrics & Gynecology | Admitting: Obstetrics & Gynecology

## 2015-03-11 NOTE — ED Provider Notes (Signed)
CSN: [id number]     Arrival date & time 03/05/15  0436 History   First MD Initiated Contact with Patient 03/05/15 [phone]     Chief Complaint  Patient presents with  . Pelvic Pain     (Consider location/radiation/quality/duration/timing/severity/associated sxs/prior Treatment) HPI Comments: presents with low pelvic pain, which he states started yesterday, however on further questioning, has been present chronically and she has been diagnosed w/ fibroids and is scheduled to have surgery mid April. Pt's pain she states he normally controlled by tramadol which she has run out of. Pain is unchanged from prior pain, no assoc symptoms.   Patient is a 39 y.o. female presenting with pelvic pain. The history is provided by the patient. No language interpreter was used.  Pelvic Pain This is a chronic problem. The current episode started more than 1 week ago. Episode frequency: monthly. The problem has not changed since onset.Pertinent negatives include no chest pain, no abdominal pain, no headaches and no shortness of breath. Nothing aggravates the symptoms. Nothing relieves the symptoms. She has tried nothing for the symptoms. The treatment provided no relief.    Past Medical History  Diagnosis Date  . Migraine   . Leg pain   . Fibroids    Past Surgical History  Procedure Laterality Date  . Tubal ligation     Family History  Problem Relation Age of Onset  . Diabetes Sister   . Heart disease Paternal Grandmother    History  Substance Use Topics  . Smoking status: Former Research scientist (life sciences)  . Smokeless tobacco: Never Used  . Alcohol Use: No   OB History    Gravida Para Term Preterm AB TAB SAB Ectopic Multiple Living   2 2 2  0 0 0 0 0 0 2     Review of Systems  Constitutional: Negative for fever, chills, diaphoresis, activity change, appetite change and fatigue.  HENT: Negative for congestion, facial swelling, rhinorrhea and sore throat.   Eyes: Negative for photophobia and discharge.   Respiratory: Negative for cough, chest tightness and shortness of breath.   Cardiovascular: Negative for chest pain, palpitations and leg swelling.  Gastrointestinal: Negative for nausea, vomiting, abdominal pain and diarrhea.  Endocrine: Negative for polydipsia and polyuria.  Genitourinary: Positive for pelvic pain. Negative for dysuria, frequency and difficulty urinating.  Musculoskeletal: Negative for back pain, arthralgias, neck pain and neck stiffness.  Skin: Negative for color change and wound.  Allergic/Immunologic: Negative for immunocompromised state.  Neurological: Negative for facial asymmetry, weakness, numbness and headaches.  Hematological: Does not bruise/bleed easily.  Psychiatric/Behavioral: Negative for confusion and agitation.      Allergies  Review of patient's allergies indicates no known allergies.  Home Medications   Prior to Admission medications   Medication Sig Start Date End Date Taking? Authorizing Provider  cyclobenzaprine (FLEXERIL) 5 MG tablet Take 1 tablet (5 mg total) by mouth 2 (two) times daily as needed for muscle spasms. 03/07/15   Tiffany Carlota Raspberry, PA-C  docusate sodium (COLACE) 100 MG capsule Take 1 capsule (100 mg total) by mouth 2 (two) times daily. Patient not taking: Reported on 03/04/2015 08/07/14   Olam Idler, MD  polyethylene glycol [facility] / Floria Raveling) packet Take 17 g by mouth daily. Patient not taking: Reported on 03/04/2015 08/10/14   Elnora Morrison, MD  traMADol (ULTRAM) 50 MG tablet Take 1 tablet (50 mg total) by mouth every 6 (six) hours as needed. 03/05/15   Ernestina Patches, MD  traMADol (ULTRAM) 50 MG tablet Take 1 tablet (  50 mg total) by mouth every 6 (six) hours as needed. 03/07/15   Tiffany Carlota Raspberry, PA-C   BP 106/67 mmHg  Pulse 81  Temp(Src) 98 F (36.7 C) (Oral)  Resp 15  Ht 5\' 4"  (1.626 m)  Wt 95 lb (43.092 kg)  BMI 16.30 kg/m2  SpO2 100%  LMP 02/27/2015 (Approximate) Physical Exam  Constitutional: She is oriented to person,  place, and time. She appears well-developed and well-nourished. No distress.  HENT:  Head: Normocephalic and atraumatic.  Mouth/Throat: No oropharyngeal exudate.  Eyes: Pupils are equal, round, and reactive to light.  Neck: Normal range of motion. Neck supple.  Cardiovascular: Normal rate, regular rhythm and normal heart sounds.  Exam reveals no gallop and no friction rub.   No murmur heard. Pulmonary/Chest: Effort normal and breath sounds normal. No respiratory distress. She has no wheezes. She has no rales.  Abdominal: Soft. Bowel sounds are normal. She exhibits no distension and no mass. There is no tenderness. There is no rebound and no guarding.  Genitourinary: Uterus is enlarged (firm).  Musculoskeletal: Normal range of motion. She exhibits no edema or tenderness.  Neurological: She is alert and oriented to person, place, and time.  Skin: Skin is warm and dry.  Psychiatric: She has a normal mood and affect.    ED Course  Procedures (including critical care time) Labs Review Labs Reviewed  WET PREP, GENITAL - Abnormal; Notable for the following:    Clue Cells Wet Prep HPF POC RARE (*)    WBC, Wet Prep HPF POC RARE (*)    All other components within normal limits  POC URINE PREG, ED  GC/CHLAMYDIA PROBE AMP ([place])    Imaging Review No results found.   EKG Interpretation None      MDM   Final diagnoses:  Chronic pelvic pain in female    Pt is a 39 y.o. female with Pmhx as above who presents with low pelvic pain, which he states started yesterday, however on further questioning, has been present chronically and she has been diagnosed w/ fibroids and is scheduled to have surgery mid April. Pt's pain she states he normally controlled by tramadol which she has run out of. Pain is unchanged from prior pain, no assoc symptoms. Pt given tramadol with some improvement. Pelvic exam with enlarged firm uterus.       Michel Santee evaluation in the Emergency Department is  complete. It has been determined that no acute conditions requiring further emergency intervention are present at this time. The patient/guardian have been advised of the diagnosis and plan. We have discussed signs and symptoms that warrant return to the ED, such as changes or worsening in symptoms, fever, chills,urinary symptoms, vaginal discharge.       Ernestina Patches, MD 03/11/15 2056

## 2015-03-12 NOTE — Patient Instructions (Addendum)
Your procedure is scheduled on:  Tuesday, March 17, 2015  Enter through the Main Entrance of [facility] at: 9:45 a.m.  Pick up the phone at the desk and dial [phone].  Call this number if you have problems the morning of surgery: [phone].  Remember: Do NOT eat food or drink after: Midnight Monday Take these medicines the morning of surgery with a SIP OF WATER:  None  Do NOT wear jewelry (body piercing), metal hair clips/bobby pins, make-up, or nail polish. Do NOT wear lotions, powders, or perfumes.  You may wear deoderant. Do NOT shave for 48 hours prior to surgery. Do NOT bring valuables to the hospital. Contacts, dentures, or bridgework may not be worn into surgery.  Have a responsible adult drive you home and stay with you for 24 hours after your procedure

## 2015-03-13 ENCOUNTER — Inpatient Hospital Stay (HOSPITAL_COMMUNITY)
Admission: RE | Admit: 2015-03-13 | Discharge: 2015-03-13 | Disposition: A | Discharge status: Home / Self Care | Payer: Self-pay | Source: Ambulatory Visit

## 2015-03-13 NOTE — Progress Notes (Signed)
Brianna Velez was a no show for pre-op appointment April 8.  Patient was unable to reschedule appointment for Monday, April 11 due to work issues.  Dr. Ihor Dow made aware.

## 2015-03-15 ENCOUNTER — Encounter (HOSPITAL_COMMUNITY): Payer: Self-pay | Admitting: Anesthesiology

## 2015-03-16 MED ORDER — DEXTROSE 5 % IV SOLN
2.0000 g | INTRAVENOUS | Status: DC
  Filled 2015-03-16: qty 2

## 2015-03-17 ENCOUNTER — Encounter (HOSPITAL_COMMUNITY): Admission: RE | Payer: Self-pay | Source: Ambulatory Visit

## 2015-03-17 ENCOUNTER — Encounter (HOSPITAL_COMMUNITY): Payer: Self-pay | Admitting: Anesthesiology

## 2015-03-17 ENCOUNTER — Ambulatory Visit (HOSPITAL_COMMUNITY)
Admission: RE | Admit: 2015-03-17 | Payer: Medicaid Other | Source: Ambulatory Visit | Admitting: Obstetrics & Gynecology

## 2015-03-17 SURGERY — ROBOTIC ASSISTED TOTAL HYSTERECTOMY
Anesthesia: Choice | Site: Abdomen

## 2015-03-17 MED ORDER — ONDANSETRON HCL 4 MG/2ML IJ SOLN
INTRAMUSCULAR | Status: AC
  Filled 2015-03-17: qty 2

## 2015-03-17 MED ORDER — LIDOCAINE HCL (CARDIAC) 20 MG/ML IV SOLN
INTRAVENOUS | Status: AC
  Filled 2015-03-17: qty 5

## 2015-03-17 MED ORDER — FENTANYL CITRATE 0.05 MG/ML IJ SOLN
INTRAMUSCULAR | Status: AC
  Filled 2015-03-17: qty 5

## 2015-03-17 MED ORDER — NEOSTIGMINE METHYLSULFATE 10 MG/10ML IV SOLN
INTRAVENOUS | Status: AC
  Filled 2015-03-17: qty 1

## 2015-03-17 MED ORDER — PROPOFOL 10 MG/ML IV BOLUS
INTRAVENOUS | Status: AC
  Filled 2015-03-17: qty 20

## 2015-03-17 MED ORDER — MIDAZOLAM HCL 2 MG/2ML IJ SOLN
INTRAMUSCULAR | Status: AC
  Filled 2015-03-17: qty 2

## 2015-03-17 MED ORDER — KETOROLAC TROMETHAMINE 30 MG/ML IJ SOLN
INTRAMUSCULAR | Status: AC
  Filled 2015-03-17: qty 1

## 2015-03-17 MED ORDER — ROCURONIUM BROMIDE 100 MG/10ML IV SOLN
INTRAVENOUS | Status: AC
  Filled 2015-03-17: qty 1

## 2015-03-17 MED ORDER — DEXAMETHASONE SODIUM PHOSPHATE 4 MG/ML IJ SOLN
INTRAMUSCULAR | Status: AC
  Filled 2015-03-17: qty 1

## 2015-03-17 MED ORDER — GLYCOPYRROLATE 0.2 MG/ML IJ SOLN
INTRAMUSCULAR | Status: AC
  Filled 2015-03-17: qty 3

## 2015-03-25 ENCOUNTER — Other Ambulatory Visit: Payer: Self-pay | Admitting: *Deleted

## 2015-03-25 DIAGNOSIS — R102 Pelvic and perineal pain: Secondary | ICD-10-CM

## 2015-03-25 NOTE — Telephone Encounter (Signed)
Brianna Velez called and left a message that she is having some pain and needs to get the doctor to prescribe pain med.  [name] and discussed per chart review she saw Dr. Maylene RoesTamala Julian and had surgery scheduled which was canceled. She states it was canceled because her medicaid ran out. We discussed she has gotten tramadol from several providers and that if she is having gynecology issues and pain should come to [facility] if urgent or call clinic  only so we can manage her.  She states she went to ER because she didn't know she could come to [facility]. States she got enough med to last until surgery and now she is out. I informed her I would send message to Dr. Maylene RoesTamala Julian who saw her and was going to do the surgery and once we hear back we will call her- probably not until tomorrow.

## 2015-03-26 MED ORDER — TRAMADOL HCL 50 MG PO TABS: 50.0000 mg | ORAL_TABLET | Freq: Four times a day (QID) | ORAL | Status: DC | PRN

## 2015-03-26 NOTE — Telephone Encounter (Signed)
Message sent to admin pool to schedule patient for f/u appointment with Dr. Ihor Dow.

## 2015-03-26 NOTE — Telephone Encounter (Signed)
Tramadol #60 no refills called into patient's [place] on [address]. Attempted to contact patient. Friend answered and stated she will not be home until 1600-- asked that she have patient call when she gets home.

## 2015-03-26 NOTE — Telephone Encounter (Signed)
She will need to f/u to discuss further management.  Until that time she may have a refill for 60 Ultram with no refills.  Thx, clh-S

## 2015-03-27 NOTE — Telephone Encounter (Signed)
Called and left a message we have called in your prescription to your pharmacy . Call us back if you have any questions.

## 2015-04-22 ENCOUNTER — Ambulatory Visit: Payer: Self-pay | Admitting: Obstetrics & Gynecology

## 2015-04-27 ENCOUNTER — Emergency Department (INDEPENDENT_AMBULATORY_CARE_PROVIDER_SITE_OTHER)
Admission: EM | Admit: 2015-04-27 | Discharge: 2015-04-27 | Disposition: A | Discharge status: Home / Self Care | Payer: Self-pay | Source: Home / Self Care | Attending: Internal Medicine | Admitting: Internal Medicine

## 2015-04-27 ENCOUNTER — Encounter (HOSPITAL_COMMUNITY): Payer: Self-pay | Admitting: Emergency Medicine

## 2015-04-27 DIAGNOSIS — J029 Acute pharyngitis, unspecified: Secondary | ICD-10-CM

## 2015-04-27 DIAGNOSIS — J069 Acute upper respiratory infection, unspecified: Secondary | ICD-10-CM

## 2015-04-27 LAB — POCT RAPID STREP A: Streptococcus, Group A Screen (Direct): NEGATIVE

## 2015-04-27 MED ORDER — IBUPROFEN 600 MG PO TABS: 600.0000 mg | ORAL_TABLET | Freq: Four times a day (QID) | ORAL | Status: DC | PRN

## 2015-04-27 NOTE — ED Provider Notes (Signed)
CSN: [id number]     Arrival date & time 04/27/15  1709 History   First MD Initiated Contact with Patient 04/27/15 1734     Chief Complaint  Patient presents with  . Sore Throat   HPI Pt states that She has has a sore throat since this morning, pt states that it hurts to talk. Patient says she felt fine yesterday. Has runny/congested nose, cough, headache. No fever. Other achiness. Nausea or vomiting. She denies sick contacts. Past Medical History  Diagnosis Date  . Migraine   . Leg pain   . Fibroids    Past Surgical History  Procedure Laterality Date  . Tubal ligation     Family History  Problem Relation Age of Onset  . Diabetes Sister   . Heart disease Paternal Grandmother    History  Substance Use Topics  . Smoking status: Former Research scientist (life sciences)  . Smokeless tobacco: Never Used  . Alcohol Use: No   OB History    Gravida Para Term Preterm AB TAB SAB Ectopic Multiple Living   2 2 2  0 0 0 0 0 0 2     Review of Systems  All other systems reviewed and are negative.   Allergies  Review of patient's allergies indicates no known allergies.  Home Medications   Prior to Admission medications   Medication Sig Start Date End Date Taking? Authorizing Provider  cyclobenzaprine (FLEXERIL) 5 MG tablet Take 1 tablet (5 mg total) by mouth 2 (two) times daily as needed for muscle spasms. 03/07/15   Tiffany Carlota Raspberry, PA-C                traMADol (ULTRAM) 50 MG tablet Take 1 tablet (50 mg total) by mouth every 6 (six) hours as needed. 03/26/15   Lavonia Drafts, MD   BP 108/58 mmHg  Pulse 88  Temp(Src) 98.5 F (36.9 C) (Oral)  Resp 16  SpO2 100%  LMP 04/27/2015 Physical Exam  Constitutional: She is oriented to person, place, and time. No distress.  Alert, nicely groomed  HENT:  Head: Atraumatic.  Lateral ears are somewhat waxy, bilateral TMs are dull but no erythema. Moderate nasal congestion, slight pallor. Patient denies history of allergies. Throat is moderately red. Voice  is slightly hoarse.  Eyes:  Conjugate gaze, no eye redness/drainage  Neck: Neck supple.  Cardiovascular: Normal rate and regular rhythm.   Pulmonary/Chest: No respiratory distress.  Lungs clear, symmetric breath sounds  Abdominal: She exhibits no distension.  Musculoskeletal: Normal range of motion.  No leg swelling  Neurological: She is alert and oriented to person, place, and time.  Skin: Skin is warm and dry.  No cyanosis  Nursing note and vitals reviewed.   ED Course  Procedures (including critical care time) Labs Review Labs Reviewed  POCT RAPID STREP A   rapid strep test at the urgent care was negative; throat culture is pending  Imaging Review No results found.   MDM   1. Acute pharyngitis, unspecified pharyngitis type   2. Upper respiratory infection, acute    Rest, push fluids. Prescription for ibuprofen for throat pain. Note for work tomorrow. Recheck or follow up PCP for persistent or throat, cough, or new fever.    Sherlene Shams, MD 04/27/15 [phone]

## 2015-04-27 NOTE — ED Notes (Signed)
Pt states that  She has has a sore throat since this morning, pt states that it hurts to talk

## 2015-04-27 NOTE — Discharge Instructions (Signed)
Push fluids, rest. Take ibuprofen for throat pain.  Recheck if fever develops, or if sore throat, runny nose, cough, not improving in several days.  Sore throat lozenges may help with throat pain.  Throat culture final result should be available in 2-3 days; rapid strep test at urgent care tonight was negative.   Pharyngitis Pharyngitis is a sore throat (pharynx). There is redness, pain, and swelling of your throat. HOME CARE   Drink enough fluids to keep your pee (urine) clear or pale yellow.  Only take medicine as told by your doctor.  You may get sick again if you do not take medicine as told. Finish your medicines, even if you start to feel better.  Do not take aspirin.  Rest.  Rinse your mouth (gargle) with salt water ( tsp of salt per 1 qt of water) every 1-2 hours. This will help the pain.  If you are not at risk for choking, you can suck on hard candy or sore throat lozenges. GET HELP IF:  You have large, tender lumps on your neck.  You have a rash.  You cough up green, yellow-brown, or bloody spit. GET HELP RIGHT AWAY IF:   You have a stiff neck.  You drool or cannot swallow liquids.  You throw up (vomit) or are not able to keep medicine or liquids down.  You have very bad pain that does not go away with medicine.  You have problems breathing (not from a stuffy nose). MAKE SURE YOU:   Understand these instructions.  Will watch your condition.  Will get help right away if you are not doing well or get worse. Document Released: 05/09/2008 Document Revised: 09/11/2013 Document Reviewed: 07/29/2013 [facility] Patient Information 2015 [place], [place]. This information is not intended to replace advice given to you by your health care provider. Make sure you discuss any questions you have with your health care provider.

## 2015-04-30 LAB — CULTURE, GROUP A STREP: Strep A Culture: NEGATIVE

## 2015-05-11 ENCOUNTER — Encounter (HOSPITAL_COMMUNITY): Payer: Self-pay | Admitting: Emergency Medicine

## 2015-05-11 ENCOUNTER — Emergency Department (INDEPENDENT_AMBULATORY_CARE_PROVIDER_SITE_OTHER)
Admission: EM | Admit: 2015-05-11 | Discharge: 2015-05-11 | Disposition: A | Discharge status: Home / Self Care | Payer: Self-pay | Source: Home / Self Care | Attending: Family Medicine | Admitting: Family Medicine

## 2015-05-11 DIAGNOSIS — D259 Leiomyoma of uterus, unspecified: Secondary | ICD-10-CM

## 2015-05-11 MED ORDER — KETOROLAC TROMETHAMINE 30 MG/ML IJ SOLN
30.0000 mg | Freq: Once | INTRAMUSCULAR | Status: AC
  Administered 2015-05-11: 30 mg via INTRAMUSCULAR

## 2015-05-11 MED ORDER — KETOROLAC TROMETHAMINE 30 MG/ML IJ SOLN
INTRAMUSCULAR | Status: AC
  Filled 2015-05-11: qty 1

## 2015-05-11 MED ORDER — TRAMADOL HCL 50 MG PO TABS: 50.0000 mg | ORAL_TABLET | Freq: Four times a day (QID) | ORAL | Status: DC | PRN

## 2015-05-11 NOTE — Discharge Instructions (Signed)
Call your doctor [facility] if further fibroid problems/

## 2015-05-11 NOTE — ED Provider Notes (Signed)
CSN: [id number]     Arrival date & time 05/11/15  1722 History   First MD Initiated Contact with Patient 05/11/15 1837     Chief Complaint  Patient presents with  . Fibroids   (Consider location/radiation/quality/duration/timing/severity/associated sxs/prior Treatment) Patient is a 39 y.o. female presenting with female genitourinary complaint. The history is provided by the patient.  Female GU Problem This is a recurrent problem. The current episode started more than 1 week ago. The problem has been gradually worsening. Associated symptoms include abdominal pain. Associated symptoms comments: Pain due to fibroids which pt states dx'd at [facility] with u/s #4 the size of golfballs, no bleeding or fever or d/c.Marland Kitchen    Past Medical History  Diagnosis Date  . Migraine   . Leg pain   . Fibroids    Past Surgical History  Procedure Laterality Date  . Tubal ligation     Family History  Problem Relation Age of Onset  . Diabetes Sister   . Heart disease Paternal Grandmother    History  Substance Use Topics  . Smoking status: Former Research scientist (life sciences)  . Smokeless tobacco: Never Used  . Alcohol Use: No   OB History    Gravida Para Term Preterm AB TAB SAB Ectopic Multiple Living   2 2 2  0 0 0 0 0 0 2     Review of Systems  Constitutional: Negative.   Gastrointestinal: Positive for abdominal pain.  Genitourinary: Positive for pelvic pain. Negative for urgency, vaginal bleeding, vaginal discharge and menstrual problem.    Allergies  Review of patient's allergies indicates no known allergies.  Home Medications   Prior to Admission medications   Medication Sig Start Date End Date Taking? Authorizing Provider  cyclobenzaprine (FLEXERIL) 5 MG tablet Take 1 tablet (5 mg total) by mouth 2 (two) times daily as needed for muscle spasms. 03/07/15   Tiffany Carlota Raspberry, PA-C  docusate sodium (COLACE) 100 MG capsule Take 1 capsule (100 mg total) by mouth 2 (two) times daily. Patient not taking: Reported on 03/04/2015  08/07/14   Olam Idler, MD  ibuprofen (ADVIL,MOTRIN) 600 MG tablet Take 1 tablet (600 mg total) by mouth every 6 (six) hours as needed. 04/27/15   Sherlene Shams, MD  polyethylene glycol [facility] / Floria Raveling) packet Take 17 g by mouth daily. Patient not taking: Reported on 03/04/2015 08/10/14   Elnora Morrison, MD  traMADol (ULTRAM) 50 MG tablet Take 1 tablet (50 mg total) by mouth every 6 (six) hours as needed. 05/11/15   Billy Fischer, MD   BP 114/75 mmHg  Pulse 90  Temp(Src) 98.3 F (36.8 C) (Oral)  Resp 18  SpO2 100%  LMP 04/27/2015 Physical Exam  Constitutional: She appears well-developed and well-nourished. No distress.  Abdominal: Soft. Bowel sounds are normal. She exhibits no mass. There is tenderness in the suprapubic area. There is no rebound and no guarding.    Skin: Skin is warm and dry.  Nursing note and vitals reviewed.   ED Course  Procedures (including critical care time) Labs Review Labs Reviewed - No data to display  Imaging Review No results found.   MDM   1. Uterine leiomyoma, unspecified location        Billy Fischer, MD 05/11/15 9622

## 2015-05-11 NOTE — ED Notes (Signed)
Patient reports having pelvic pain due to fibroids.  Usually takes tramadol, took last one yesterday.  Was scheduled for surgery in the past, but there was issue with medicaid.

## 2015-05-18 ENCOUNTER — Telehealth: Payer: Self-pay | Admitting: General Practice

## 2015-05-18 DIAGNOSIS — R102 Pelvic and perineal pain: Secondary | ICD-10-CM

## 2015-05-18 MED ORDER — TRAMADOL HCL 50 MG PO TABS: 50.0000 mg | ORAL_TABLET | Freq: Two times a day (BID) | ORAL | Status: DC | PRN

## 2015-05-18 MED ORDER — IBUPROFEN 800 MG PO TABS: 800.0000 mg | ORAL_TABLET | Freq: Three times a day (TID) | ORAL | Status: DC | PRN

## 2015-05-18 NOTE — Telephone Encounter (Signed)
Patient called and left message stating she wants a refill on her tramadol and to talk to someone about having surgery. Per chart review patient received tramadol 50mg  disp 20 on 6/6. Also, per chart review patient was scheduled for surgery in April but surgery was cancelled because patient could not keep pre op appt. Spoke to Dr Harolyn Rutherford in regards to patient's phone call. Dr Harolyn Rutherford authorized ibuprofen 800mg  Rx for moderate pain and tramadol Rx for severe pain only. Rx for tramadol is to last a month. No refills prior to that. Also patient should contact [place] to reschedule her surgery. Called patient stating I am returning her phone call, provided number to [place] for rescheduling surgery and that she will need to call her. Also discussed refill of tramadol and new Rx for ibuprofen. Discussed with patient that she is to take ibuprofen for moderate pain and to only take the tramadol for severe pain. Discussed the tramadol Rx is to last her one month and no refills will be given prior to that. Patient verbalized understanding to all and had no questions. Tramadol called in to [facility]

## 2015-06-15 ENCOUNTER — Telehealth: Payer: Self-pay | Admitting: General Practice

## 2015-06-15 NOTE — Telephone Encounter (Addendum)
Patient called in to front office requesting refill on tramadol. Spoke to Dr Ihor Dow who states patient needs to come in and be seen before refills will be given. Called patient, no answer- left message stating we are trying to reach you to return your phone call, please call us back at clinics  7/15  0935  Per chart review, pt went to [facility] on 7/12 for evaluation of her pain and was given Rx for ibuprofen and tramadol.  Diane Day RNC

## 2015-06-16 ENCOUNTER — Inpatient Hospital Stay (HOSPITAL_COMMUNITY)
Admission: AD | Admit: 2015-06-16 | Discharge: 2015-06-16 | Disposition: A | Discharge status: Home / Self Care | Payer: Self-pay | Source: Ambulatory Visit | Attending: Obstetrics & Gynecology | Admitting: Obstetrics & Gynecology

## 2015-06-16 ENCOUNTER — Encounter (HOSPITAL_COMMUNITY): Payer: Self-pay | Admitting: *Deleted

## 2015-06-16 DIAGNOSIS — Z87891 Personal history of nicotine dependence: Secondary | ICD-10-CM | POA: Insufficient documentation

## 2015-06-16 DIAGNOSIS — R102 Pelvic and perineal pain: Secondary | ICD-10-CM | POA: Insufficient documentation

## 2015-06-16 LAB — URINALYSIS, ROUTINE W REFLEX MICROSCOPIC
Bilirubin Urine: NEGATIVE
Glucose, UA: NEGATIVE mg/dL
Hgb urine dipstick: NEGATIVE
Ketones, ur: NEGATIVE mg/dL
Leukocytes, UA: NEGATIVE
Nitrite: NEGATIVE
Protein, ur: NEGATIVE mg/dL
Specific Gravity, Urine: 1.015 (ref 1.005–1.030)
Urobilinogen, UA: 1 mg/dL (ref 0.0–1.0)
pH: 7 (ref 5.0–8.0)

## 2015-06-16 LAB — POCT PREGNANCY, URINE: Preg Test, Ur: NEGATIVE

## 2015-06-16 MED ORDER — TRAMADOL HCL 50 MG PO TABS
50.0000 mg | ORAL_TABLET | Freq: Once | ORAL | Status: AC
  Administered 2015-06-16: 50 mg via ORAL
  Filled 2015-06-16: qty 1

## 2015-06-16 MED ORDER — TRAMADOL HCL 50 MG PO TABS: 50.0000 mg | ORAL_TABLET | Freq: Two times a day (BID) | ORAL | Status: DC | PRN

## 2015-06-16 MED ORDER — IBUPROFEN 800 MG PO TABS: 800.0000 mg | ORAL_TABLET | Freq: Three times a day (TID) | ORAL | Status: DC | PRN

## 2015-06-16 NOTE — Discharge Instructions (Signed)
Uterine Fibroid A uterine fibroid is a growth (tumor) that occurs in your uterus. This type of tumor is not cancerous and does not spread out of the uterus. You can have one or many fibroids. Fibroids can vary in size, weight, and where they grow in the uterus. Some can become quite large. Most fibroids do not require medical treatment, but some can cause pain or heavy bleeding during and between periods. CAUSES  A fibroid is the result of a single uterine cell that keeps growing (unregulated), which is different than most cells in the human body. Most cells have a control mechanism that keeps them from reproducing without control.  SIGNS AND SYMPTOMS   Bleeding.  Pelvic pain and pressure.  Bladder problems due to the size of the fibroid.  Infertility and miscarriages depending on the size and location of the fibroid. DIAGNOSIS  Uterine fibroids are diagnosed through a physical exam. Your health care provider may feel the lumpy tumors during a pelvic exam. Ultrasonography may be done to get information regarding size, location, and number of tumors.  TREATMENT   Your health care provider may recommend watchful waiting. This involves getting the fibroid checked by your health care provider to see if it grows or shrinks.   Hormone treatment or an intrauterine device (IUD) may be prescribed.   Surgery may be needed to remove the fibroids (myomectomy) or the uterus (hysterectomy). This depends on your situation. When fibroids interfere with fertility and a woman wants to become pregnant, a health care provider may recommend having the fibroids removed.  HOME CARE INSTRUCTIONS  Home care depends on how you were treated. In general:   Keep all follow-up appointments with your health care provider.   Only take over-the-counter or prescription medicines as directed by your health care provider. If you were prescribed a hormone treatment, take the hormone medicines exactly as directed. Do not  take aspirin. It can cause bleeding.   Talk to your health care provider about taking iron pills.  If your periods are troublesome but not so heavy, lie down with your feet raised slightly above your heart. Place cold packs on your lower abdomen.   If your periods are heavy, write down the number of pads or tampons you use per month. Bring this information to your health care provider.   Include green vegetables in your diet.  SEEK IMMEDIATE MEDICAL CARE IF:  You have pelvic pain or cramps not controlled with medicines.   You have a sudden increase in pelvic pain.   You have an increase in bleeding between and during periods.   You have excessive periods and soak tampons or pads in a half hour or less.  You feel lightheaded or have fainting episodes. Document Released: 11/18/2000 Document Revised: 09/11/2013 Document Reviewed: 06/20/2013 [employer] Patient Information 2015 [employer]. This information is not intended to replace advice given to you by your health care provider. Make sure you discuss any questions you have with your health care provider.  

## 2015-06-16 NOTE — [facility] Provider Note (Signed)
History     CSN: [id number]  Arrival date and time: 06/16/15 0209   First Provider Initiated Contact with Patient 06/16/15 0258      No chief complaint on file.  HPI Comments: Brianna Velez is a 39 y.o. [id number] who presents today with pelvic pain. This has been an ongoing issue for her for about 3 years. She was scheduled for surgery, but had to cancel because her medicaid was cancelled. She called the clinic yesterday for a refill on her pain medication. She was told she had to be seen for a refill. She is waiting for an appointment. She denies any bleeding at this time. She states that her period is due to start soon.   Pelvic Pain The patient's primary symptoms include pelvic pain. This is a recurrent problem. The current episode started yesterday. The problem occurs constantly. The problem has been unchanged. The pain is severe. The problem affects both sides. She is not pregnant. Associated symptoms include abdominal pain and nausea. Pertinent negatives include no constipation, diarrhea, dysuria, fever, frequency, urgency or vomiting. Nothing aggravates the symptoms. She has tried NSAIDs (tramadol ) for the symptoms. The treatment provided moderate relief. (Fibroids )     Past Medical History  Diagnosis Date  . Migraine   . Leg pain   . Fibroids     Past Surgical History  Procedure Laterality Date  . Tubal ligation      Family History  Problem Relation Age of Onset  . Diabetes Sister   . Heart disease Paternal Grandmother     History  Substance Use Topics  . Smoking status: Former Research scientist (life sciences)  . Smokeless tobacco: Never Used  . Alcohol Use: No    Allergies: No Known Allergies  Prescriptions prior to admission  Medication Sig Dispense Refill Last Dose  . HYDROcodone-acetaminophen (NORCO/VICODIN) 5-325 MG per tablet Take 1 tablet by mouth every 6 (six) hours as needed for moderate pain.   06/15/2015 at Unknown time  . traMADol (ULTRAM) 50 MG tablet Take 1 tablet (50 mg  total) by mouth every 12 (twelve) hours as needed. 60 tablet 0 06/15/2015 at Unknown time  . cyclobenzaprine (FLEXERIL) 5 MG tablet Take 1 tablet (5 mg total) by mouth 2 (two) times daily as needed for muscle spasms. 20 tablet 0 More than a month at Unknown time  . docusate sodium (COLACE) 100 MG capsule Take 1 capsule (100 mg total) by mouth 2 (two) times daily. (Patient not taking: Reported on 03/04/2015) 60 capsule 0 More than a month at Unknown time  . ibuprofen (ADVIL,MOTRIN) 800 MG tablet Take 1 tablet (800 mg total) by mouth every 8 (eight) hours as needed. 60 tablet 0 06/14/2015  . polyethylene glycol (MIRALAX / GLYCOLAX) packet Take 17 g by mouth daily. (Patient not taking: Reported on 03/04/2015) 7 each 0 More than a month at Unknown time    Review of Systems  Constitutional: Negative for fever.  Gastrointestinal: Positive for nausea and abdominal pain. Negative for vomiting, diarrhea and constipation.  Genitourinary: Positive for pelvic pain. Negative for dysuria, urgency and frequency.   Physical Exam   Blood pressure 97/66, pulse 79, temperature 97.8 F (36.6 C), temperature source Oral, resp. rate 18, height 5\' 4"  (1.626 m), weight 42.638 kg (94 lb), last menstrual period 05/23/2015, SpO2 100 %.  Physical Exam  Nursing note and vitals reviewed. Constitutional: She is oriented to person, place, and time. She appears well-developed and well-nourished. No distress.  HENT:  Head: Normocephalic.  Cardiovascular:  Normal rate.   Respiratory: Effort normal.  GI: Soft. There is no tenderness. There is no rebound.  Neurological: She is alert and oriented to person, place, and time.  Skin: Skin is warm and dry.  Psychiatric: She has a normal mood and affect.    MAU Course  Procedures  MDM   Assessment and Plan   1. Pelvic pain in female    DC home Comfort measures reviewed  RX: ibuprofen #30 and tramadol 50mg  #20 0RF  Return to MAU as needed   Follow-up Information     Call [facility].   Specialty:  Obstetrics and Gynecology   Contact information:   [place] [phone]        Mathis Bud 06/16/2015, 2:59 AM

## 2015-06-16 NOTE — MAU Note (Signed)
Pt reports she has fibroids and she has ran out of meds (tramadol), states she is in so much pain and can't take it anymore.

## 2015-06-22 ENCOUNTER — Telehealth: Payer: Self-pay | Admitting: *Deleted

## 2015-06-22 NOTE — Telephone Encounter (Signed)
Pt called and stated that she needs a refill on her medication.

## 2015-06-23 MED ORDER — IBUPROFEN 800 MG PO TABS: 800.0000 mg | ORAL_TABLET | Freq: Three times a day (TID) | ORAL | Status: DC | PRN

## 2015-06-23 MED ORDER — TRAMADOL HCL 50 MG PO TABS: 50.0000 mg | ORAL_TABLET | Freq: Two times a day (BID) | ORAL | Status: DC | PRN

## 2015-06-23 NOTE — Telephone Encounter (Addendum)
Brianna Velez called and left another message wanting to know if she was picking up the prescription or the medicine.  Called Brianna Velez and informed her we have the prescription for her to pick up . I also informed her that our provider approved the prescription because we see that she does have an appointment scheduled for next week on 7/ 27 at 2:30 and she will not approve another refill until patient seen in the clinic.  Brianna Velez voices understanding.

## 2015-06-23 NOTE — Telephone Encounter (Signed)
Reviewed chart and discussed with Fatima Blank, CNM- since she has been seen in [facility]U and has an appointment with [place] scheduled Lattie Haw approved one refill of tramadol. Called  [place] and left a message I was calling back about her phone calls and we have a refill prescription for her to pick up at clinic during office hours-may call nurse back if questions.

## 2015-06-23 NOTE — Telephone Encounter (Signed)
Brianna Velez again and left a message she needs a refill on her  Medicine because she is in pain and would appreciate it.

## 2015-06-23 NOTE — Telephone Encounter (Signed)
Refill Rx for Tramadol 50 mg Q 12 hours PRN x 20 tabs.  Also refill for ibuprofen 800 mg Q 6 hours PRN.  Pt has f/u appt in clinic in 1 week. No more refills for pain medication until pt is evaluated in clinic.

## 2015-07-01 ENCOUNTER — Encounter: Payer: Self-pay | Admitting: Obstetrics & Gynecology

## 2015-07-01 ENCOUNTER — Ambulatory Visit (INDEPENDENT_AMBULATORY_CARE_PROVIDER_SITE_OTHER): Payer: Self-pay | Admitting: Obstetrics & Gynecology

## 2015-07-01 VITALS — BP 109/77 | HR 82 | Temp 98.5°F | Resp 20 | Ht 64.0 in | Wt 90.7 lb

## 2015-07-01 DIAGNOSIS — D259 Leiomyoma of uterus, unspecified: Secondary | ICD-10-CM

## 2015-07-01 MED ORDER — TRAMADOL HCL 50 MG PO TABS: 50.0000 mg | ORAL_TABLET | Freq: Two times a day (BID) | ORAL | Status: DC | PRN

## 2015-07-01 NOTE — Progress Notes (Signed)
Patient ID: Brianna Velez, female   DOB: Jul 22, 1976, 39 y.o.   MRN: [id number] Cc:Pt states she is having pelvic pain daily. She also has pain during intercourse.  She states the only thing that helps her pain is Tramadol but she does not want to keep taking pain meds.   [id number] Patient's last menstrual period was 05/23/2015.   [facility]U note reviewed, office notes  Imp Fibroid uterus and pelvic pain, needs to R/S RATH once financial assistance approved  Plan refill Tramadol 20 tablets Financial application RTC to see Dr Ihor Dow 15 min face to face and coordination of care  Woodroe Mode, MD 07/01/2015

## 2015-07-01 NOTE — Progress Notes (Signed)
Pt states she is having pelvic pain daily. She also has pain during intercourse.  She states the only thing that helps her pain is Tramadol but she does not want to keep taking pain meds.

## 2015-07-06 ENCOUNTER — Encounter (HOSPITAL_COMMUNITY): Payer: Self-pay | Admitting: Emergency Medicine

## 2015-07-06 DIAGNOSIS — X58XXXA Exposure to other specified factors, initial encounter: Secondary | ICD-10-CM | POA: Insufficient documentation

## 2015-07-06 DIAGNOSIS — Z87891 Personal history of nicotine dependence: Secondary | ICD-10-CM | POA: Insufficient documentation

## 2015-07-06 DIAGNOSIS — Y9389 Activity, other specified: Secondary | ICD-10-CM | POA: Insufficient documentation

## 2015-07-06 DIAGNOSIS — G43909 Migraine, unspecified, not intractable, without status migrainosus: Secondary | ICD-10-CM | POA: Insufficient documentation

## 2015-07-06 DIAGNOSIS — Y990 Civilian activity done for income or pay: Secondary | ICD-10-CM | POA: Insufficient documentation

## 2015-07-06 DIAGNOSIS — Z86018 Personal history of other benign neoplasm: Secondary | ICD-10-CM | POA: Insufficient documentation

## 2015-07-06 DIAGNOSIS — S39012A Strain of muscle, fascia and tendon of lower back, initial encounter: Secondary | ICD-10-CM | POA: Insufficient documentation

## 2015-07-06 DIAGNOSIS — Y9289 Other specified places as the place of occurrence of the external cause: Secondary | ICD-10-CM | POA: Insufficient documentation

## 2015-07-06 NOTE — ED Notes (Signed)
Pt. reports low back muscle ache onset last week , denies injury or fall , no hematuria or dysuria .

## 2015-07-07 ENCOUNTER — Emergency Department (HOSPITAL_COMMUNITY)
Admission: EM | Admit: 2015-07-07 | Discharge: 2015-07-07 | Disposition: A | Discharge status: Home / Self Care | Payer: Self-pay | Attending: Emergency Medicine | Admitting: Emergency Medicine

## 2015-07-07 DIAGNOSIS — S39012A Strain of muscle, fascia and tendon of lower back, initial encounter: Secondary | ICD-10-CM

## 2015-07-07 MED ORDER — KETOROLAC TROMETHAMINE 30 MG/ML IJ SOLN: 30.0000 mg | Freq: Once | INTRAMUSCULAR | Status: DC

## 2015-07-07 MED ORDER — CYCLOBENZAPRINE HCL 10 MG PO TABS: 10.0000 mg | ORAL_TABLET | Freq: Three times a day (TID) | ORAL | Status: DC | PRN

## 2015-07-07 NOTE — ED Notes (Signed)
Discussed patient case with Dr. Rex Kras. She acknowledges, plans to see patient soon. Informed the patient of the plan of care.

## 2015-07-07 NOTE — ED Provider Notes (Signed)
CSN: [id number]     Arrival date & time 07/06/15  2302 History   First MD Initiated Contact with Patient 07/07/15 0051     Chief Complaint  Patient presents with  . Back Pain     (Consider location/radiation/quality/duration/timing/severity/associated sxs/prior Treatment) HPI Comments: 39 year old female with past medical history below who presents with low back pain. The patient states that several days ago after work, she began having low back pain. 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 The pain is right-sided and constant. It is severe, aching, and sharp. No urinary symptoms. No bowel or bladder incontinence. No saddle anesthesia, lower extremity numbness, or lower extremity weakness. She has tried ibuprofen earlier today as well as tramadol without relief. No fevers or recent illness. No history of cancer. No night sweats.  Patient is a 39 y.o. female presenting with back pain. The history is provided by the patient.  Back Pain   Past Medical History  Diagnosis Date  . Migraine   . Leg pain   . Fibroids    Past Surgical History  Procedure Laterality Date  . Tubal ligation     Family History  Problem Relation Age of Onset  . Diabetes Sister   . Heart disease Paternal Grandmother   . Hypertension Father   . Dementia Mother   . Hypertension Mother    History  Substance Use Topics  . Smoking status: Former Research scientist (life sciences)  . Smokeless tobacco: Never Used  . Alcohol Use: No   OB History    Gravida Para Term Preterm AB TAB SAB Ectopic Multiple Living   2 2 2  0 0 0 0 0 0 2     Review of Systems  Musculoskeletal: Positive for back pain.   10 Systems reviewed and are negative for acute change except as noted in the HPI.    Allergies  Review of patient's allergies indicates no known allergies.  Home Medications   Prior to Admission medications   Medication Sig Start Date End Date Taking? Authorizing Provider  ibuprofen (ADVIL,MOTRIN) 800 MG  tablet Take 1 tablet (800 mg total) by mouth every 8 (eight) hours as needed. 06/23/15  Yes Lisa A Leftwich-Kirby, CNM  traMADol (ULTRAM) 50 MG tablet Take 1 tablet (50 mg total) by mouth every 12 (twelve) hours as needed. 07/01/15  Yes Woodroe Mode, MD  cyclobenzaprine (FLEXERIL) 10 MG tablet Take 1 tablet (10 mg total) by mouth 3 (three) times daily as needed for muscle spasms. 07/07/15   Sharlett Iles, MD  docusate sodium (COLACE) 100 MG capsule Take 1 capsule (100 mg total) by mouth 2 (two) times daily. Patient not taking: Reported on 03/04/2015 08/07/14   Olam Idler, MD  polyethylene glycol [facility] / Floria Raveling) packet Take 17 g by mouth daily. Patient not taking: Reported on 07/07/2015 08/10/14   Elnora Morrison, MD   BP 100/65 mmHg  Pulse 71  Temp(Src) 98.3 F (36.8 C) (Oral)  Resp 18  SpO2 100%  LMP 05/23/2015 Physical Exam  Constitutional: She is oriented to person, place, and time. No distress.  Thin but otherwise well-appearing  HENT:  Head: Normocephalic and atraumatic.  Moist mucous membranes  Eyes: Conjunctivae are normal. Pupils are equal, round, and reactive to light.  Neck: Neck supple.  Cardiovascular: Normal rate, regular rhythm and normal heart sounds.   No murmur heard. Pulmonary/Chest: Effort normal and breath sounds normal.  Abdominal: Soft. Bowel sounds are normal. She exhibits no distension. There  is no tenderness.  Musculoskeletal: She exhibits no edema.  No midline spinal tenderness or step off, right lumbar paraspinal muscle tenderness to palpation, 5/5 strength bilateral lower extremities, normal sensation bilateral lower extremities  Neurological: She is alert and oriented to person, place, and time. She displays normal reflexes. She exhibits normal muscle tone.  Fluent speech  Skin: Skin is warm and dry.  Psychiatric: She has a normal mood and affect. Judgment normal.  Nursing note and vitals reviewed.   ED Course  Procedures (including critical care  time) Labs Review Labs Reviewed - No data to display  Imaging Review No results found.   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 No red flag symptoms to suggest cauda equina or deep space infection. Instructed on supportive care at home and provided with Flexeril. Patient instructed to follow up with PCP. Patient discharged in satisfactory condition.  Sharlett Iles, MD 07/07/15 [phone]

## 2015-07-07 NOTE — Discharge Instructions (Signed)

## 2015-07-07 NOTE — ED Notes (Signed)
Dr. Little at the bedside. 

## 2015-07-08 ENCOUNTER — Telehealth: Payer: Self-pay | Admitting: *Deleted

## 2015-07-08 NOTE — Telephone Encounter (Signed)
Patient called to request refill on her Tramadol.

## 2015-07-09 NOTE — Telephone Encounter (Signed)
Per Dr. Roselie Awkward patient may have one refill on the tramadol as previously ordered. Rx called to pharmacy. Called patient and informed her that rx was sent to pharmacy.

## 2015-07-15 ENCOUNTER — Other Ambulatory Visit: Payer: Self-pay | Admitting: Obstetrics & Gynecology

## 2015-07-17 ENCOUNTER — Encounter (HOSPITAL_BASED_OUTPATIENT_CLINIC_OR_DEPARTMENT_OTHER): Payer: Self-pay | Admitting: *Deleted

## 2015-07-17 ENCOUNTER — Emergency Department (HOSPITAL_BASED_OUTPATIENT_CLINIC_OR_DEPARTMENT_OTHER)
Admission: EM | Admit: 2015-07-17 | Discharge: 2015-07-17 | Disposition: A | Discharge status: Home / Self Care | Payer: Self-pay | Attending: Emergency Medicine | Admitting: Emergency Medicine

## 2015-07-17 DIAGNOSIS — R1030 Lower abdominal pain, unspecified: Secondary | ICD-10-CM | POA: Insufficient documentation

## 2015-07-17 DIAGNOSIS — G8929 Other chronic pain: Secondary | ICD-10-CM | POA: Insufficient documentation

## 2015-07-17 DIAGNOSIS — G43909 Migraine, unspecified, not intractable, without status migrainosus: Secondary | ICD-10-CM | POA: Insufficient documentation

## 2015-07-17 DIAGNOSIS — Z87891 Personal history of nicotine dependence: Secondary | ICD-10-CM | POA: Insufficient documentation

## 2015-07-17 DIAGNOSIS — R109 Unspecified abdominal pain: Secondary | ICD-10-CM

## 2015-07-17 DIAGNOSIS — Z86018 Personal history of other benign neoplasm: Secondary | ICD-10-CM | POA: Insufficient documentation

## 2015-07-17 DIAGNOSIS — Z3202 Encounter for pregnancy test, result negative: Secondary | ICD-10-CM | POA: Insufficient documentation

## 2015-07-17 LAB — PREGNANCY, URINE: Preg Test, Ur: NEGATIVE

## 2015-07-17 MED ORDER — TRAMADOL HCL 50 MG PO TABS
50.0000 mg | ORAL_TABLET | Freq: Once | ORAL | Status: AC
  Administered 2015-07-17: 50 mg via ORAL
  Filled 2015-07-17: qty 1

## 2015-07-17 MED ORDER — TRAMADOL HCL 50 MG PO TABS: 50.0000 mg | ORAL_TABLET | Freq: Four times a day (QID) | ORAL | Status: DC | PRN

## 2015-07-17 NOTE — Discharge Instructions (Signed)
Abdominal Pain, Women Take the tramadol prescribed for bad pain or Tylenol for mild pain. Keep your scheduled appointment with the women's GYN clinic on 07/20/2015 Abdominal (stomach, pelvic, or belly) pain can be caused by many things. It is important to tell your doctor:  The location of the pain.  Does it come and go or is it present all the time?  Are there things that start the pain (eating certain foods, exercise)?  Are there other symptoms associated with the pain (fever, nausea, vomiting, diarrhea)? All of this is helpful to know when trying to find the cause of the pain. CAUSES   Stomach: virus or bacteria infection, or ulcer.  Intestine: appendicitis (inflamed appendix), regional ileitis (Crohn's disease), ulcerative colitis (inflamed colon), irritable bowel syndrome, diverticulitis (inflamed diverticulum of the colon), or cancer of the stomach or intestine.  Gallbladder disease or stones in the gallbladder.  Kidney disease, kidney stones, or infection.  Pancreas infection or cancer.  Fibromyalgia (pain disorder).  Diseases of the female organs:  Uterus: fibroid (non-cancerous) tumors or infection.  Fallopian tubes: infection or tubal pregnancy.  Ovary: cysts or tumors.  Pelvic adhesions (scar tissue).  Endometriosis (uterus lining tissue growing in the pelvis and on the pelvic organs).  Pelvic congestion syndrome (female organs filling up with blood just before the menstrual period).  Pain with the menstrual period.  Pain with ovulation (producing an egg).  Pain with an IUD (intrauterine device, birth control) in the uterus.  Cancer of the female organs.  Functional pain (pain not caused by a disease, may improve without treatment).  Psychological pain.  Depression. DIAGNOSIS  Your doctor will decide the seriousness of your pain by doing an examination.  Blood tests.  X-rays.  Ultrasound.  CT scan (computed tomography, special type of  X-ray).  MRI (magnetic resonance imaging).  Cultures, for infection.  Barium enema (dye inserted in the large intestine, to better view it with X-rays).  Colonoscopy (looking in intestine with a lighted tube).  Laparoscopy (minor surgery, looking in abdomen with a lighted tube).  Major abdominal exploratory surgery (looking in abdomen with a large incision). TREATMENT  The treatment will depend on the cause of the pain.   Many cases can be observed and treated at home.  Over-the-counter medicines recommended by your caregiver.  Prescription medicine.  Antibiotics, for infection.  Birth control pills, for painful periods or for ovulation pain.  Hormone treatment, for endometriosis.  Nerve blocking injections.  Physical therapy.  Antidepressants.  Counseling with a psychologist or psychiatrist.  Minor or major surgery. HOME CARE INSTRUCTIONS   Do not take laxatives, unless directed by your caregiver.  Take over-the-counter pain medicine only if ordered by your caregiver. Do not take aspirin because it can cause an upset stomach or bleeding.  Try a clear liquid diet (broth or water) as ordered by your caregiver. Slowly move to a bland diet, as tolerated, if the pain is related to the stomach or intestine.  Have a thermometer and take your temperature several times a day, and record it.  Bed rest and sleep, if it helps the pain.  Avoid sexual intercourse, if it causes pain.  Avoid stressful situations.  Keep your follow-up appointments and tests, as your caregiver orders.  If the pain does not go away with medicine or surgery, you may try:  Acupuncture.  Relaxation exercises (yoga, meditation).  Group therapy.  Counseling. SEEK MEDICAL CARE IF:   You notice certain foods cause stomach pain.  Your home care  treatment is not helping your pain.  You need stronger pain medicine.  You want your IUD removed.  You feel faint or lightheaded.  You  develop nausea and vomiting.  You develop a rash.  You are having side effects or an allergy to your medicine. SEEK IMMEDIATE MEDICAL CARE IF:   Your pain does not go away or gets worse.  You have a fever.  Your pain is felt only in portions of the abdomen. The right side could possibly be appendicitis. The left lower portion of the abdomen could be colitis or diverticulitis.  You are passing blood in your stools (bright red or black tarry stools, with or without vomiting).  You have blood in your urine.  You develop chills, with or without a fever.  You pass out. MAKE SURE YOU:   Understand these instructions.  Will watch your condition.  Will get help right away if you are not doing well or get worse. Document Released: 09/18/2007 Document Revised: 04/07/2014 Document Reviewed: 10/08/2009 [facility] Patient Information 2015 [place], [place]. This information is not intended to replace advice given to you by your health care provider. Make sure you discuss any questions you have with your health care provider.

## 2015-07-17 NOTE — ED Provider Notes (Signed)
CSN: [id number]     Arrival date & time 07/17/15  1718 History  This chart was scribed for Brianna Dakin, MD by Brianna Velez, ED scribe. This patient was seen in room MH04/MH04 and the patient's care was started at 6:53 PM.   Chief Complaint  Patient presents with  . Abdominal Pain   The history is provided by the patient. No language interpreter was used.    HPI Comments: Brianna Velez is a 39 y.o. female, with a hx of fibroids, who presents to the Emergency Department complaining of constant, moderate, lower abd pain with onset 2 years ago . Pt notes a hx of fibroids which began 2 years ago and since then has been receiving tramadol for pain management from the local women's clinic which she ran out of yesterday. She further notes that she has an appointment in 3 days at the clinic and has requested tramadol to cover her until then. Her last menstrual period was 5 days ago and notes that it was painful and heavy which is normal for her. Pt also notes her last normal BM was yesterday. Nothing makes symptoms better or worse. No other associated symptoms. She denies dysuria, vaginal dischage, weakness and lightheadedness.   [facility]  Past Medical History  Diagnosis Date  . Migraine   . Leg pain   . Fibroids    Past Surgical History  Procedure Laterality Date  . Tubal ligation     Family History  Problem Relation Age of Onset  . Diabetes Sister   . Heart disease Paternal Grandmother   . Hypertension Father   . Dementia Mother   . Hypertension Mother    Social History  Substance Use Topics  . Smoking status: Former Research scientist (life sciences)  . Smokeless tobacco: Never Used  . Alcohol Use: No   OB History    Gravida Para Term Preterm AB TAB SAB Ectopic Multiple Living   2 2 2  0 0 0 0 0 0 2     Review of Systems  Constitutional: Negative.   HENT: Negative.   Respiratory: Negative.   Cardiovascular: Negative.   Gastrointestinal: Positive for abdominal pain.  Genitourinary:  Positive for menstrual problem.       Painful menstrual periods  Musculoskeletal: Negative.   Skin: Negative.   Neurological: Negative.   Psychiatric/Behavioral: Negative.   All other systems reviewed and are negative.   Allergies  Review of patient's allergies indicates no known allergies.  Home Medications   Prior to Admission medications   Medication Sig Start Date End Date Taking? Authorizing Provider  cyclobenzaprine (FLEXERIL) 10 MG tablet Take 1 tablet (10 mg total) by mouth 3 (three) times daily as needed for muscle spasms. 07/07/15   Sharlett Iles, MD  docusate sodium (COLACE) 100 MG capsule Take 1 capsule (100 mg total) by mouth 2 (two) times daily. Patient not taking: Reported on 03/04/2015 08/07/14   Olam Idler, MD  ibuprofen (ADVIL,MOTRIN) 800 MG tablet Take 1 tablet (800 mg total) by mouth every 8 (eight) hours as needed. 06/23/15   Lisa A Leftwich-Kirby, CNM  polyethylene glycol (MIRALAX / GLYCOLAX) packet Take 17 g by mouth daily. Patient not taking: Reported on 07/07/2015 08/10/14   Elnora Morrison, MD  traMADol (ULTRAM) 50 MG tablet Take 1 tablet (50 mg total) by mouth every 12 (twelve) hours as needed. 07/01/15   Woodroe Mode, MD   BP 98/66 mmHg  Pulse 72  Temp(Src) 98.3 F (36.8 C) (Oral)  Resp  16  Ht 5\' 4"  (1.626 m)  Wt 90 lb (40.824 kg)  BMI 15.44 kg/m2  SpO2 100%  LMP 07/12/2015 Physical Exam  Constitutional: She appears well-developed and well-nourished.  HENT:  Head: Normocephalic and atraumatic.  Eyes: Conjunctivae are normal. Pupils are equal, round, and reactive to light.  Neck: Neck supple. No tracheal deviation present. No thyromegaly present.  Cardiovascular: Normal rate and regular rhythm.   No murmur heard. Pulmonary/Chest: Effort normal and breath sounds normal.  Abdominal: Soft. Bowel sounds are normal. She exhibits no distension. There is tenderness.  Mild tenderness over suprapubic area  Musculoskeletal: Normal range of motion. She  exhibits no edema or tenderness.  Neurological: She is alert. Coordination normal.  Skin: Skin is warm and dry. No rash noted.  Psychiatric: She has a normal mood and affect.  Nursing note and vitals reviewed.   ED Course  Procedures  DIAGNOSTIC STUDIES: Oxygen Saturation is 100% on RA, normal by my interpretation.    COORDINATION OF CARE: 6:56 PM Discussed treatment plan with pt at bedside and pt agreed to plan.  Labs Review Labs Reviewed - No data to display  Imaging Review No results found. I, Brianna Velez, personally reviewed and evaluated these images and lab results as part of my medical decision-making.   EKG Interpretation None     8:10 PM pain improved after treatment with tramadol. Results for orders placed or performed during the hospital encounter of 07/17/15  Pregnancy, urine  Result Value Ref Range   Preg Test, Ur NEGATIVE NEGATIVE   No results found.  MDM  Plan prescription tramadol. Keep scheduled appointment with GYN clinic in 3 days Diagnosis chronic abdominal pain Final diagnoses:  None      I personally performed the services described in this documentation, which was scribed in my presence. The recorded information has been reviewed and considered.    Brianna Dakin, MD 07/17/15 2015

## 2015-07-17 NOTE — ED Notes (Signed)
States she has fibroids and is having abdominal pain. She ran out of Tramadol yesterday.

## 2015-07-20 ENCOUNTER — Telehealth: Payer: Self-pay | Admitting: *Deleted

## 2015-07-20 ENCOUNTER — Encounter (HOSPITAL_COMMUNITY): Payer: Self-pay

## 2015-07-20 ENCOUNTER — Inpatient Hospital Stay (HOSPITAL_COMMUNITY)
Admission: AD | Admit: 2015-07-20 | Discharge: 2015-07-20 | Disposition: A | Discharge status: Home / Self Care | Payer: Self-pay | Source: Ambulatory Visit | Attending: Family Medicine | Admitting: Family Medicine

## 2015-07-20 DIAGNOSIS — R102 Pelvic and perineal pain: Secondary | ICD-10-CM | POA: Insufficient documentation

## 2015-07-20 DIAGNOSIS — D259 Leiomyoma of uterus, unspecified: Secondary | ICD-10-CM | POA: Insufficient documentation

## 2015-07-20 DIAGNOSIS — G8929 Other chronic pain: Secondary | ICD-10-CM | POA: Insufficient documentation

## 2015-07-20 DIAGNOSIS — Z87891 Personal history of nicotine dependence: Secondary | ICD-10-CM | POA: Insufficient documentation

## 2015-07-20 LAB — URINALYSIS, ROUTINE W REFLEX MICROSCOPIC
Bilirubin Urine: NEGATIVE
Glucose, UA: NEGATIVE mg/dL
Hgb urine dipstick: NEGATIVE
Ketones, ur: 15 mg/dL — AB
Leukocytes, UA: NEGATIVE
Nitrite: NEGATIVE
Protein, ur: NEGATIVE mg/dL
Specific Gravity, Urine: >1.03 — ABNORMAL HIGH (ref 1.005–1.030)
Urobilinogen, UA: 1 mg/dL (ref 0.0–1.0)
pH: 6 (ref 5.0–8.0)

## 2015-07-20 MED ORDER — TRAMADOL HCL 50 MG PO TABS: 50.0000 mg | ORAL_TABLET | Freq: Four times a day (QID) | ORAL | Status: DC | PRN

## 2015-07-20 MED ORDER — HYDROMORPHONE HCL 1 MG/ML IJ SOLN
1.0000 mg | Freq: Once | INTRAMUSCULAR | Status: AC
  Administered 2015-07-20: 1 mg via INTRAMUSCULAR
  Filled 2015-07-20: qty 1

## 2015-07-20 NOTE — MAU Provider Note (Signed)
History     CSN: [id number]  Arrival date and time: 07/20/15 3149   First Provider Initiated Contact with Patient 07/20/15 1916      Chief Complaint  Patient presents with  . Abdominal Pain   HPI Comments: Brianna Velez is a 39 y.o. [phone] with known fiboids presenting with exacerbation of lower abdominal and pelvic pain. She's been having intermittent pain for 2 years and describes it as constant but waxing and waning. Yesterday and today pain was worse and she needs a work excuse for today. Accompanied by partner. Seen by Dr. Roselie Awkward 2 wks with plan to schedule for [facility], but does not have [place] appointment.    Abdominal Pain This is a chronic problem. The current episode started more than 1 year ago. The onset quality is gradual. The problem occurs constantly. The most recent episode lasted 2 days. The problem has been waxing and waning. The pain is located in the RLQ and LLQ. The pain is at a severity of 9/10. The pain is severe. The quality of the pain is aching, colicky and sharp. The abdominal pain radiates to the RUQ. Associated symptoms include headaches, nausea and weight loss. Pertinent negatives include no anorexia, constipation, diarrhea, frequency or vomiting. Associated symptoms comments: Decreased po intake today. The pain is aggravated by movement. Relieved by: Relief from tramadol, but has run out. Treatments tried: ibuprofen and tramadol. The treatment provided significant relief. Prior workup: last pelvic US 2015> multiple fibroids. There is no history of colon cancer, Crohn's disease, gallstones, GERD or irritable bowel syndrome.     OB History  Gravida Para Term Preterm AB SAB TAB Ectopic Multiple Living  2 2 2  0 0 0 0 0 0 2    # Outcome Date GA Lbr Len/2nd Weight Sex Delivery Anes PTL Lv  2 Term 06/20/03     Vag-Spont     1 Term 08/28/95     Vag-Spont          Past Medical History  Diagnosis Date  . Migraine   . Leg pain   . Fibroids     Past Surgical  History  Procedure Laterality Date  . Tubal ligation      Family History  Problem Relation Age of Onset  . Diabetes Sister   . Heart disease Paternal Grandmother   . Hypertension Father   . Dementia Mother   . Hypertension Mother     Social History  Substance Use Topics  . Smoking status: Former Research scientist (life sciences)  . Smokeless tobacco: Never Used  . Alcohol Use: No    Allergies: No Known Allergies  Prescriptions prior to admission  Medication Sig Dispense Refill Last Dose  . cyclobenzaprine (FLEXERIL) 10 MG tablet Take 1 tablet (10 mg total) by mouth 3 (three) times daily as needed for muscle spasms. 10 tablet 0   . docusate sodium (COLACE) 100 MG capsule Take 1 capsule (100 mg total) by mouth 2 (two) times daily. (Patient not taking: Reported on 03/04/2015) 60 capsule 0 Not Taking at Unknown time  . ibuprofen (ADVIL,MOTRIN) 800 MG tablet Take 1 tablet (800 mg total) by mouth every 8 (eight) hours as needed. 30 tablet 0 07/06/2015 at Unknown time  . polyethylene glycol (MIRALAX / GLYCOLAX) packet Take 17 g by mouth daily. (Patient not taking: Reported on 07/07/2015) 7 each 0 Not Taking at Unknown time  . traMADol (ULTRAM) 50 MG tablet Take 1 tablet (50 mg total) by mouth every 6 (six) hours as needed for moderate  pain or severe pain. 15 tablet 0     Review of Systems  Constitutional: Positive for weight loss.  Gastrointestinal: Positive for nausea and abdominal pain. Negative for vomiting, diarrhea, constipation and anorexia.  Genitourinary: Negative for frequency.  Neurological: Positive for headaches.   Physical Exam   Blood pressure 99/61, pulse 96, temperature 98.6 F (37 C), temperature source Oral, resp. rate 16, last menstrual period 07/05/2015.  Physical Exam  Genitourinary:  NEFG; Bimanual: Firm irregular fixed uterus 14-16 wk size, NT (exam after analgesia given)    MAU Course  Procedures Results for orders placed or performed during the hospital encounter of 07/20/15 (from  the past 24 hour(s))  Urinalysis, Routine w reflex microscopic (not at [name])     Status: Abnormal   Collection Time: 07/20/15  7:00 PM  Result Value Ref Range   Color, Urine YELLOW YELLOW   APPearance CLEAR CLEAR   Specific Gravity, Urine >1.030 (H) 1.005 - 1.030   pH 6.0 5.0 - 8.0   Glucose, UA NEGATIVE NEGATIVE mg/dL   Hgb urine dipstick NEGATIVE NEGATIVE   Bilirubin Urine NEGATIVE NEGATIVE   Ketones, ur 15 (A) NEGATIVE mg/dL   Protein, ur NEGATIVE NEGATIVE mg/dL   Urobilinogen, UA 1.0 0.0 - 1.0 mg/dL   Nitrite NEGATIVE NEGATIVE   Leukocytes, UA NEGATIVE NEGATIVE   Dilaudid 1mg  IM given with good relief of pain  MDM Chronic pain with significant fibroids  Assessment and Plan   1. Uterine leiomyoma, unspecified location   2. Chronic pelvic pain in female      Medication List    STOP taking these medications        cyclobenzaprine 10 MG tablet  Commonly known as:  FLEXERIL     docusate sodium 100 MG capsule  Commonly known as:  COLACE     polyethylene glycol packet  Commonly known as:  MIRALAX / GLYCOLAX      TAKE these medications        ibuprofen 800 MG tablet  Commonly known as:  ADVIL,MOTRIN  Take 1 tablet (800 mg total) by mouth every 8 (eight) hours as needed.     traMADol 50 MG tablet  Commonly known as:  ULTRAM  Take 1 tablet (50 mg total) by mouth every 6 (six) hours as needed.      #15, NR  She has an appointment for financial assistance this week and will need an appointment with GYN clinic to discuss options: [facility] sent to [facility]  Follow-up Information    Follow up with [facility]. Schedule an appointment as soon as possible for a visit in 2 weeks.   Specialty:  Obstetrics and Gynecology   Contact information:   [address] [place] [phone]     Brianna Velez 07/20/2015, 7:27 PM

## 2015-07-20 NOTE — MAU Note (Signed)
Pain in abd, lower pelvis and legs due to fibroids, started yesterday.  Unable to go to work due to pain

## 2015-07-20 NOTE — Telephone Encounter (Signed)
Maya called and left a message Friday 07/17/15 am that was very difficult to understand- part of what was understood was that her phone wasn't working well and to call her on daughter phone [phone].Per chart has fibroids and plan is to get LAVH when financial assistance approved.  Called back that number and confirmed it was Elda that was calling . She states she needs a refill of her tramadol pain medicine. I informed her I would need to check with Dr. Roselie Awkward, who was not here today- and then get back to her. A female family member came on the line stating Aroura was in a lot of pain and asking if she could be seen today. I informed him our office was now closed, but if she was in that much pain that she could not wait to hear back from [place] - that she could go to MAU to be seen. He voiced understanding.

## 2015-07-20 NOTE — Discharge Instructions (Signed)
Fibroids Fibroids are lumps (tumors) that can occur any place in a woman's body. These lumps are not cancerous. Fibroids vary in size, weight, and where they grow. HOME CARE  Do not take aspirin.  Write down the number of pads or tampons you use during your period. Tell your doctor. This can help determine the best treatment for you. GET HELP RIGHT AWAY IF:  You have pain in your lower belly (abdomen) that is not helped with medicine.  You have cramps that are not helped with medicine.  You have more bleeding between or during your period.  You feel lightheaded or pass out (faint).  Your lower belly pain gets worse. MAKE SURE YOU:  Understand these instructions.  Will watch your condition.  Will get help right away if you are not doing well or get worse. Document Released: 12/24/2010 Document Revised: 02/13/2012 Document Reviewed: 12/24/2010 [employer] Patient Information 2015 [employer]. This information is not intended to replace advice given to you by your health care provider. Make sure you discuss any questions you have with your health care provider.  

## 2015-07-21 NOTE — Telephone Encounter (Signed)
Per chart review, patient went to MAU and concerns were addressed there

## 2015-07-27 ENCOUNTER — Telehealth: Payer: Self-pay | Admitting: *Deleted

## 2015-07-27 NOTE — Telephone Encounter (Signed)
Mry left a message this am stating she is no longer working and is unemployed. Wanting to know if there is a way she can get surgery done. States she is tired of being in pain. Per chart review saw Dr. Roselie Awkward 07/01/15 and plan was to reschedule [place] once she had applied for and received financial assistance.   Called [facility] and left a message with a female that I was returning Adalene's call and she can call us back tomorrow as we are closed.

## 2015-07-29 NOTE — Telephone Encounter (Signed)
Attempted to contacted patient, no answer, left message for patient to return call to clinic.   Pt contacted clinic, states she needs refill on Tramadol, message sent to Dr. Roselie Awkward.  Pt states she needs additional assistance with financial aide since losing her job.  Number to Financial services given, [phone].  Pt verbalizes understanding.

## 2015-07-29 NOTE — Telephone Encounter (Signed)
07/28/15 @ 1412-  Pt called and requested a call back.

## 2015-07-31 ENCOUNTER — Telehealth: Payer: Self-pay | Admitting: *Deleted

## 2015-07-31 ENCOUNTER — Other Ambulatory Visit: Payer: Self-pay | Admitting: *Deleted

## 2015-07-31 ENCOUNTER — Other Ambulatory Visit: Payer: Self-pay | Admitting: Obstetrics & Gynecology

## 2015-07-31 MED ORDER — TRAMADOL HCL 50 MG PO TABS: 50.0000 mg | ORAL_TABLET | Freq: Four times a day (QID) | ORAL | Status: DC | PRN

## 2015-07-31 NOTE — Telephone Encounter (Signed)
Spoke with Dr. Roselie Awkward.  Ordered refill for tramadol.  Appointment scheduled for 08/03/15 with Dr. Ihor Dow.  Spoke with patient via phone.  Told her she needed to come pickup prescription before 12pm.  Patient states understanding.

## 2015-07-31 NOTE — Telephone Encounter (Signed)
Received message left on nurse line at [phone].  Patient states she needs a refill on her pain medication.  She has 2 pills left and when she is out of pain med she doesn't know what to do.  Spoke with Dr. Roselie Awkward.

## 2015-08-03 ENCOUNTER — Encounter: Payer: Medicaid Other | Admitting: Obstetrics & Gynecology

## 2015-08-05 ENCOUNTER — Encounter: Payer: Self-pay | Admitting: Obstetrics & Gynecology

## 2015-08-05 ENCOUNTER — Ambulatory Visit (INDEPENDENT_AMBULATORY_CARE_PROVIDER_SITE_OTHER): Payer: Self-pay | Admitting: Obstetrics & Gynecology

## 2015-08-05 VITALS — BP 110/76 | HR 102 | Temp 98.7°F | Ht 63.0 in | Wt 87.0 lb

## 2015-08-05 DIAGNOSIS — N921 Excessive and frequent menstruation with irregular cycle: Secondary | ICD-10-CM

## 2015-08-05 DIAGNOSIS — R102 Pelvic and perineal pain: Secondary | ICD-10-CM

## 2015-08-05 LAB — CBC
HCT: 32.9 % — ABNORMAL LOW (ref 36.0–46.0)
Hemoglobin: 10.1 g/dL — ABNORMAL LOW (ref 12.0–15.0)
MCH: 25.9 pg — ABNORMAL LOW (ref 26.0–34.0)
MCHC: 30.7 g/dL (ref 30.0–36.0)
MCV: 84.4 fL (ref 78.0–100.0)
MPV: 12.3 fL (ref 8.6–12.4)
Platelets: 214 10*3/uL (ref 150–400)
RBC: 3.9 MIL/uL (ref 3.87–5.11)
RDW: 14.4 % (ref 11.5–15.5)
WBC: 2.4 10*3/uL — ABNORMAL LOW (ref 4.0–10.5)

## 2015-08-05 MED ORDER — IBUPROFEN 600 MG PO TABS: 600.0000 mg | ORAL_TABLET | Freq: Four times a day (QID) | ORAL | Status: DC | PRN

## 2015-08-05 NOTE — Progress Notes (Signed)
Patient ID: Brianna Velez, female   DOB: 03/26/1976, 39 y.o.   MRN: [id number] History:  39 y.o. [id number] here today for follow up of bleeding and fibroid.  She was prev scheduled for a RATH which she did not get due to change (loss) of insurance.  She denies change in sx.  She reports pain for which she is on chronic narcs for andpprolonged bleeding. She has been dx'd with fibroids.  She is here today because she wants to reschedule her surgery.  She denies new sx.  Pt denies dizziness or SOB. No h/o blood transfusions.  The following portions of the patient's history were reviewed and updated as appropriate: allergies, current medications, past family history, past medical history, past social history, past surgical history and problem list.  Review of Systems:  Pertinent items are noted in HPI.  Objective:  Physical Exam Blood pressure 110/76, pulse 102, temperature 98.7 F (37.1 C), temperature source Oral, height 5' 3" (1.6 m), weight 87 lb (39.463 kg), last menstrual period 08/05/2015. Gen: NAD Abd: Soft, nontender and nondistended Pelvic: pt on menses.  Exam declined  05/07/2014 CLINICAL DATA: Prolonged bleeding, pelvic pain.  EXAM: TRANSABDOMINAL AND TRANSVAGINAL ULTRASOUND OF PELVIS  TECHNIQUE: Both transabdominal and transvaginal ultrasound examinations of the pelvis were performed. Transabdominal technique was performed for global imaging of the pelvis including uterus, ovaries, adnexal regions, and pelvic cul-de-sac. It was necessary to proceed with endovaginal exam following the transabdominal exam to visualize the endometrium and right ovary.  COMPARISON: None  FINDINGS: Uterus  Measurements: 11.7 x 7.5 x 9.4 cm. Multiple fibroids noted. Left intramural fibroid is the largest, measuring 4.5 x 3.6 x 3.5 cm. Exophytic subserosal fundal fibroid measures 3.3 x 2.2 x 2.1 cm. Fundal intramural fibroid measures 3.9 x 3.7 x 3.3 cm.  Endometrium  Thickness: 5 mm.  No focal abnormality visualized.  Right ovary  Measurements: 2.9 x 1.4 x 1.7 cm. Normal appearance/no adnexal mass.  Left ovary  Measurements: 2.6 x 1.5 x 2.0 cm. Normal appearance/no adnexal mass.  Other findings  No free fluid.  IMPRESSION: Enlarged fibroid uterus.   Assessment & Plan:  Sx fibroids with menorrhagia and pelvic pain Pt was prev scheduled for a RATH and she cancelled due to insurance issues.  She reports that her sx persist and she is ready for surgery.  I have reviewed her surgical options.  She is aware that I will not be able to do her surgery have introduced her to Dr. Elly Modena.     Patient desires surgical management with laparoscopic assisted vaginal hysterectomy with bilateral salpingectomy.  The risks of surgery were discussed in detail with the patient including but not limited to: bleeding which may require transfusion or reoperation; infection which may require prolonged hospitalization or re-hospitalization and antibiotic therapy; injury to bowel, bladder, ureters and major vessels or other surrounding organs; need for additional procedures including laparotomy; thromboembolic phenomenon, incisional problems and other postoperative or anesthesia complications.  Patient was told that the likelihood that her condition and symptoms will be treated effectively with this surgical management was very high; the postoperative expectations were also discussed in detail. The patient also understands the alternative treatment options which were discussed in full. All questions were answered.  She was told that she will be contacted by our surgical scheduler regarding the time and date of her surgery; routine preoperative instructions of having nothing to eat or drink after midnight on the day prior to surgery and also coming to the hospital 1  1/2 hours prior to her time of surgery were also emphasized.  She was told she may be called for a preoperative appointment about  a week prior to surgery and will be given further preoperative instructions at that visit. Printed patient education handouts about the procedure were given to the patient to review at home. Pt met Dr. Elly Modena today  CBC today Motrin prn until 2 weeks prior to [place]. Harraway-Smith, M.D., Cherlynn June

## 2015-08-05 NOTE — Patient Instructions (Signed)
Total Laparoscopic Hysterectomy °A total laparoscopic hysterectomy is a minimally invasive surgery to remove your uterus and cervix. This surgery is performed by making several small cuts (incisions) in your abdomen. It can also be done with a thin, lighted tube (laparoscope) inserted into two small incisions in your lower abdomen. Your fallopian tubes and ovaries can be removed (bilateral salpingo-oophorectomy) during this surgery as well. Benefits of minimally invasive surgery include: °· Less pain. °· Less risk of blood loss. °· Less risk of infection. °· Quicker return to normal activities. °LET YOUR HEALTH CARE PROVIDER KNOW ABOUT: °· Any allergies you have. °· All medicines you are taking, including vitamins, herbs, eye drops, creams, and over-the-counter medicines. °· Previous problems you or members of your family have had with the use of anesthetics. °· Any blood disorders you have. °· Previous surgeries you have had. °· Medical conditions you have. °RISKS AND COMPLICATIONS  °Generally, this is a safe procedure. However, as with any procedure, complications can occur. Possible complications include: °· Bleeding. °· Blood clots in the legs or lung. °· Infection. °· Injury to surrounding organs. °· Problems with anesthesia. °· Early menopause symptoms (hot flashes, night sweats, insomnia). °· Risk of conversion to an open abdominal incision. °BEFORE THE PROCEDURE °· Ask your health care provider about changing or stopping your regular medicines. °· Do not take aspirin or blood thinners (anticoagulants) for 1 week before the surgery or as told by your health care provider. °· Do not eat or drink anything for 8 hours before the surgery or as told by your health care provider. °· Quit smoking if you smoke. °· Arrange for a ride home after surgery and for someone to help you at home during recovery. °PROCEDURE  °· You will be given antibiotic medicine. °· An IV tube will be placed in your arm. You will be given  medicine to make you sleep (general anesthetic). °· A gas (carbon dioxide) will be used to inflate your abdomen. This will allow your surgeon to look inside your abdomen, perform your surgery, and treat any other problems found if necessary. °· Three or four small incisions (often less than 1/2 inch) will be made in your abdomen. One of these incisions will be made in the area of your belly button (navel). The laparoscope will be inserted into the incision. Your surgeon will look through the laparoscope while doing your procedure. °· Other surgical instruments will be inserted through the other incisions. °· Your uterus may be removed through your vagina or cut into small pieces and removed through the small incisions. °· Your incisions will be closed. °AFTER THE PROCEDURE °· The gas will be released from inside your abdomen. °· You will be taken to the recovery area where a nurse will watch and check your progress. Once you are awake, stable, and taking fluids well, without other problems, you will return to your room or be allowed to go home. °· There is usually minimal discomfort following the surgery because the incisions are so small. °· You will be given pain medicine while you are in the hospital and for when you go home. °Document Released: 09/18/2007 Document Revised: 07/24/2013 Document Reviewed: 06/11/2013 °[employer]® Patient Information ©2015 [employer]. This information is not intended to replace advice given to you by your health care provider. Make sure you discuss any questions you have with your health care provider. ° °

## 2015-08-07 ENCOUNTER — Telehealth: Payer: Self-pay | Admitting: *Deleted

## 2015-08-07 NOTE — Telephone Encounter (Signed)
Dr. Ihor Dow requested that we call patient to confirm she is taking her Iron supplement. Pt informed and stated that she will resume taking iron also asked for refill on tramadol. Authorization given by Dr. Ihor Dow. Rx called to pharmacy.

## 2015-08-12 ENCOUNTER — Encounter (HOSPITAL_COMMUNITY): Payer: Self-pay | Admitting: *Deleted

## 2015-08-17 ENCOUNTER — Telehealth: Payer: Self-pay | Admitting: *Deleted

## 2015-08-17 DIAGNOSIS — R102 Pelvic and perineal pain: Secondary | ICD-10-CM

## 2015-08-17 DIAGNOSIS — D259 Leiomyoma of uterus, unspecified: Secondary | ICD-10-CM

## 2015-08-17 NOTE — Telephone Encounter (Signed)
Pt left message requesting refill on tramadol.

## 2015-08-18 MED ORDER — TRAMADOL HCL 50 MG PO TABS: 50.0000 mg | ORAL_TABLET | Freq: Four times a day (QID) | ORAL | Status: DC | PRN

## 2015-08-18 NOTE — Telephone Encounter (Signed)
Brianna Velez called again this am and states she needs a refill of tramadol. States she doesn;t have any. States she is in pain and could we please refill her tramadol since her surgery is not until 09/29/15.  Patient called again and front desk contacted nurse- I asked them to tell her I will call doctor and call her.  Reviewed chart and noted last refills for tramadol were 8/26 and 08/03/15.  Verified has surgery scheduled with Dr. Elly Modena.  Called and discussed with Dr. Roselie Awkward.  Refill approved.   Called Brianna Velez and notified her refill approved. Advised her to alternate ibuprofen and tramadol and see if that gives her better and more constant pain relief . She voices understanding.

## 2015-08-18 NOTE — Telephone Encounter (Signed)
Called patient on preferred mobile number and no voicemail box has been set up yet. Called patient at telephone encounter number, no answer- left message stating we are trying to reach you to return your phone call, please call us back at the clinics

## 2015-09-01 ENCOUNTER — Telehealth: Payer: Self-pay | Admitting: *Deleted

## 2015-09-01 DIAGNOSIS — R102 Pelvic and perineal pain: Secondary | ICD-10-CM

## 2015-09-01 DIAGNOSIS — D259 Leiomyoma of uterus, unspecified: Secondary | ICD-10-CM

## 2015-09-01 MED ORDER — TRAMADOL HCL 50 MG PO TABS: 50.0000 mg | ORAL_TABLET | Freq: Four times a day (QID) | ORAL | Status: DC | PRN

## 2015-09-01 NOTE — Telephone Encounter (Signed)
Patient left messages on nurse line today 09/01/15 at 1306, 1309, 1437 and 1459.  Patient states she took her last tramadol this morning and needs a refill.  Spoke with Dr. Elly Modena.  Refill approved.  Will notify patient.

## 2015-09-01 NOTE — Telephone Encounter (Signed)
Patient called requesting refill on tramadol.  

## 2015-09-02 ENCOUNTER — Telehealth: Payer: Self-pay | Admitting: *Deleted

## 2015-09-02 NOTE — Telephone Encounter (Signed)
Pt contacted office to check status of medication refill.  Contacted [facility], tramadol reordered. Contacted patient and informed of medication refill. Pt verbalizes understanding.

## 2015-09-10 ENCOUNTER — Encounter (HOSPITAL_COMMUNITY): Payer: Self-pay

## 2015-09-10 ENCOUNTER — Other Ambulatory Visit: Payer: Self-pay | Admitting: Obstetrics and Gynecology

## 2015-09-10 ENCOUNTER — Encounter (HOSPITAL_COMMUNITY)
Admission: RE | Admit: 2015-09-10 | Discharge: 2015-09-10 | Disposition: A | Discharge status: Home / Self Care | Payer: Medicaid Other | Source: Ambulatory Visit | Attending: Obstetrics and Gynecology | Admitting: Obstetrics and Gynecology

## 2015-09-10 DIAGNOSIS — Z01818 Encounter for other preprocedural examination: Secondary | ICD-10-CM | POA: Insufficient documentation

## 2015-09-10 DIAGNOSIS — D259 Leiomyoma of uterus, unspecified: Secondary | ICD-10-CM | POA: Diagnosis not present

## 2015-09-10 DIAGNOSIS — N920 Excessive and frequent menstruation with regular cycle: Secondary | ICD-10-CM | POA: Insufficient documentation

## 2015-09-10 HISTORY — DX: Personal history of other diseases of the digestive system: Z87.19

## 2015-09-10 LAB — CBC
HCT: 33.3 % — ABNORMAL LOW (ref 36.0–46.0)
Hemoglobin: 10.6 g/dL — ABNORMAL LOW (ref 12.0–15.0)
MCH: 28 pg (ref 26.0–34.0)
MCHC: 31.8 g/dL (ref 30.0–36.0)
MCV: 88.1 fL (ref 78.0–100.0)
Platelets: 237 10*3/uL (ref 150–400)
RBC: 3.78 MIL/uL — ABNORMAL LOW (ref 3.87–5.11)
RDW: 17 % — ABNORMAL HIGH (ref 11.5–15.5)
WBC: 3 10*3/uL — ABNORMAL LOW (ref 4.0–10.5)

## 2015-09-10 NOTE — Patient Instructions (Signed)
Your procedure is scheduled on:  September 29, 2015  Enter through the Main Entrance of [facility] at:  7:30 am   Pick up the phone at the desk and dial [phone].  Call this number if you have problems the morning of surgery: [phone].  Remember: Do NOT eat food:  After midnight on Monday night the 24th  Do NOT drink clear liquids after:  Midnight on the 24th  Take these medicines the morning of surgery with a SIP OF WATER:  None   Do NOT wear jewelry (body piercing), metal hair clips/bobby pins, make-up, or nail polish. Do NOT wear lotions, powders, or perfumes.  You may wear deoderant. Do NOT shave for 48 hours prior to surgery. Do NOT bring valuables to the hospital. Contacts, dentures, or bridgework may not be worn into surgery. Leave suitcase in car.  After surgery it may be brought to your room.  For patients admitted to the hospital, checkout time is 11:00 AM the day of discharge.

## 2015-09-15 ENCOUNTER — Telehealth: Payer: Self-pay | Admitting: *Deleted

## 2015-09-15 DIAGNOSIS — R102 Pelvic and perineal pain: Secondary | ICD-10-CM

## 2015-09-15 DIAGNOSIS — D259 Leiomyoma of uterus, unspecified: Secondary | ICD-10-CM

## 2015-09-15 MED ORDER — TRAMADOL HCL 50 MG PO TABS: 50.0000 mg | ORAL_TABLET | Freq: Four times a day (QID) | ORAL | Status: DC | PRN

## 2015-09-15 NOTE — Telephone Encounter (Addendum)
Pt left message requesting refill of Tramadol.  Per chart review, pt has surgery scheduled on 10/27.  Refill approved by Dr. Kennon Rounds, phoned to pharmacy.  Called pt and informed her that the refill was approved and has been called to her pharmacy. She was given a slightly higher quantity this time so that it will last until her surgery.  Pt voiced understanding and had no further questions.

## 2015-09-28 NOTE — H&P (Signed)
Brianna Velez is an 39 y.o. female [phone] with menorrhagia and pelvic pain secondary to fibroid uterus presenting today for definitive treatment with hysterectomy. She report monthly menses which overflow on her pads. She reports some dyspareunia and daily pevic pain. She failed medical management with OCP. Patient is otherwise without complaints  Pertinent Gynecological History: Menses: flow is excessive with use of 8 pads or tampons on heaviest days and regular every month without intermenstrual spotting Bleeding: dysfunctional uterine bleeding Contraception: tubal ligation DES exposure: denies Blood transfusions: none Sexually transmitted diseases: no past history Previous GYN Procedures: BTL  Last pap: normal Date: 2015 OB History: G2, P2002   Menstrual History:  No LMP recorded.    Past Medical History  Diagnosis Date  . Migraine   . Leg pain   . Fibroids   . History of hiatal hernia     Past Surgical History  Procedure Laterality Date  . Tubal ligation    . Wisdom tooth extraction      Family History  Problem Relation Age of Onset  . Diabetes Sister   . Heart disease Paternal Grandmother   . Hypertension Father   . Dementia Mother   . Hypertension Mother     Social History:  reports that she has quit smoking. She has never used smokeless tobacco. She reports that she does not drink alcohol or use illicit drugs.  Allergies: No Known Allergies  Prescriptions prior to admission  Medication Sig Dispense Refill Last Dose  . ferrous sulfate 325 (65 FE) MG EC tablet Take 325 mg by mouth daily with breakfast.   Past Month at Unknown time  . traMADol (ULTRAM) 50 MG tablet Take 1 tablet (50 mg total) by mouth every 6 (six) hours as needed for moderate pain or severe pain. 42 tablet 0 09/28/2015 at Unknown time  . ibuprofen (ADVIL,MOTRIN) 600 MG tablet Take 1 tablet (600 mg total) by mouth every 6 (six) hours as needed. 30 tablet 1 Unknown at Unknown time  . ibuprofen  (ADVIL,MOTRIN) 800 MG tablet Take 1 tablet (800 mg total) by mouth every 8 (eight) hours as needed. (Patient not taking: Reported on 09/14/2015) 30 tablet 0 Not Taking at Unknown time    ROS See pertinent in HPI Blood pressure 106/73, pulse 70, temperature 97.9 F (36.6 C), temperature source Oral, resp. rate 18, SpO2 100 %. Physical Exam GENERAL: Well-developed, well-nourished female in no acute distress.  HEENT: Normocephalic, atraumatic. Sclerae anicteric.  NECK: Supple. Normal thyroid.  LUNGS: Clear to auscultation bilaterally.  HEART: Regular rate and rhythm. ABDOMEN: Soft, nontender, nondistended. No organomegaly. PELVIC: deferred to OR EXTREMITIES: No cyanosis, clubbing, or edema, 2+ distal pulses.  No results found for this or any previous visit (from the past 24 hour(s)).  No results found. 05/2014 FINDINGS: Uterus  Measurements: 11.7 x 7.5 x 9.4 cm. Multiple fibroids noted. Left intramural fibroid is the largest, measuring 4.5 x 3.6 x 3.5 cm. Exophytic subserosal fundal fibroid measures 3.3 x 2.2 x 2.1 cm. Fundal intramural fibroid measures 3.9 x 3.7 x 3.3 cm.  Endometrium  Thickness: 5 mm. No focal abnormality visualized.  Right ovary  Measurements: 2.9 x 1.4 x 1.7 cm. Normal appearance/no adnexal mass.  Left ovary  Measurements: 2.6 x 1.5 x 2.0 cm. Normal appearance/no adnexal mass.  Other findings  No free fluid.  IMPRESSION: Enlarged fibroid uterus   Assessment/Plan: 39 yo G2P2 with menorrhagia and pelvic pain secondary to fibroid uterus here for definitive treatment - Risks, benefits and alternatives  were reviewed and explained to the patient including but not limited to risks of bleeding, infection and damage to adjacent organs. Patient verbalized understanding and all questions were answered. Patient consented to laparoscopic assisted vaginal hysterectomy with bilateral salpingectomy, possible total abdominal hysterectomy with bilateral  salpingectomy  Rishon Thilges 09/29/2015, 8:11 AM

## 2015-09-29 ENCOUNTER — Ambulatory Visit (HOSPITAL_COMMUNITY): Payer: Medicaid Other | Admitting: Anesthesiology

## 2015-09-29 ENCOUNTER — Encounter (HOSPITAL_COMMUNITY)
Admission: RE | Disposition: A | Discharge status: Home / Self Care | Payer: Self-pay | Source: Ambulatory Visit | Attending: Obstetrics and Gynecology

## 2015-09-29 ENCOUNTER — Inpatient Hospital Stay (HOSPITAL_COMMUNITY)
Admission: RE | Admit: 2015-09-29 | Discharge: 2015-10-01 | DRG: 743 | Disposition: A | Discharge status: Home / Self Care | Payer: Medicaid Other | Source: Ambulatory Visit | Attending: Obstetrics and Gynecology | Admitting: Obstetrics and Gynecology

## 2015-09-29 ENCOUNTER — Encounter (HOSPITAL_COMMUNITY): Payer: Self-pay | Admitting: *Deleted

## 2015-09-29 ENCOUNTER — Encounter (HOSPITAL_COMMUNITY): Payer: Self-pay | Admitting: Obstetrics and Gynecology

## 2015-09-29 DIAGNOSIS — D251 Intramural leiomyoma of uterus: Principal | ICD-10-CM | POA: Diagnosis present

## 2015-09-29 DIAGNOSIS — D259 Leiomyoma of uterus, unspecified: Secondary | ICD-10-CM

## 2015-09-29 DIAGNOSIS — Z87891 Personal history of nicotine dependence: Secondary | ICD-10-CM | POA: Diagnosis not present

## 2015-09-29 DIAGNOSIS — N920 Excessive and frequent menstruation with regular cycle: Secondary | ICD-10-CM | POA: Insufficient documentation

## 2015-09-29 DIAGNOSIS — N941 Unspecified dyspareunia: Secondary | ICD-10-CM | POA: Diagnosis present

## 2015-09-29 DIAGNOSIS — D252 Subserosal leiomyoma of uterus: Secondary | ICD-10-CM | POA: Diagnosis present

## 2015-09-29 DIAGNOSIS — Z90710 Acquired absence of both cervix and uterus: Secondary | ICD-10-CM | POA: Diagnosis present

## 2015-09-29 DIAGNOSIS — R102 Pelvic and perineal pain: Secondary | ICD-10-CM | POA: Diagnosis present

## 2015-09-29 HISTORY — PX: BILATERAL SALPINGECTOMY: SHX5743

## 2015-09-29 HISTORY — PX: ABDOMINAL HYSTERECTOMY: SHX81

## 2015-09-29 LAB — PREGNANCY, URINE: Preg Test, Ur: NEGATIVE

## 2015-09-29 LAB — TYPE AND SCREEN
ABO/RH(D): A POS
Antibody Screen: NEGATIVE

## 2015-09-29 LAB — ABO/RH: ABO/RH(D): A POS

## 2015-09-29 SURGERY — SALPINGECTOMY, BILATERAL, OPEN
Anesthesia: General | Site: Abdomen | Laterality: Bilateral

## 2015-09-29 MED ORDER — ONDANSETRON HCL 4 MG/2ML IJ SOLN: 4.0000 mg | Freq: Once | INTRAMUSCULAR | Status: DC | PRN

## 2015-09-29 MED ORDER — FENTANYL CITRATE (PF) 100 MCG/2ML IJ SOLN
INTRAMUSCULAR | Status: AC
  Filled 2015-09-29: qty 2

## 2015-09-29 MED ORDER — SODIUM CHLORIDE 0.9 % IJ SOLN: 9.0000 mL | INTRAMUSCULAR | Status: DC | PRN

## 2015-09-29 MED ORDER — MIDAZOLAM HCL 2 MG/2ML IJ SOLN
INTRAMUSCULAR | Status: AC
  Filled 2015-09-29: qty 4

## 2015-09-29 MED ORDER — FENTANYL CITRATE (PF) 100 MCG/2ML IJ SOLN
INTRAMUSCULAR | Status: DC | PRN
  Administered 2015-09-29: 100 ug via INTRAVENOUS
  Administered 2015-09-29 (×2): 25 ug via INTRAVENOUS

## 2015-09-29 MED ORDER — NEOSTIGMINE METHYLSULFATE 10 MG/10ML IV SOLN
INTRAVENOUS | Status: DC | PRN
  Administered 2015-09-29: 3 mg via INTRAVENOUS

## 2015-09-29 MED ORDER — LIDOCAINE HCL (CARDIAC) 20 MG/ML IV SOLN
INTRAVENOUS | Status: DC | PRN
  Administered 2015-09-29: 30 mg via INTRAVENOUS

## 2015-09-29 MED ORDER — VASOPRESSIN 20 UNIT/ML IV SOLN
INTRAVENOUS | Status: AC
  Filled 2015-09-29: qty 1

## 2015-09-29 MED ORDER — DEXAMETHASONE SODIUM PHOSPHATE 10 MG/ML IJ SOLN
INTRAMUSCULAR | Status: DC | PRN
  Administered 2015-09-29: 4 mg via INTRAVENOUS

## 2015-09-29 MED ORDER — ROCURONIUM BROMIDE 100 MG/10ML IV SOLN
INTRAVENOUS | Status: DC | PRN
  Administered 2015-09-29: 25 mg via INTRAVENOUS

## 2015-09-29 MED ORDER — BUPIVACAINE HCL (PF) 0.25 % IJ SOLN
INTRAMUSCULAR | Status: AC
  Filled 2015-09-29: qty 30

## 2015-09-29 MED ORDER — PHENYLEPHRINE 40 MCG/ML (10ML) SYRINGE FOR IV PUSH (FOR BLOOD PRESSURE SUPPORT)
PREFILLED_SYRINGE | INTRAVENOUS | Status: AC
  Filled 2015-09-29: qty 10

## 2015-09-29 MED ORDER — MIDAZOLAM HCL 2 MG/2ML IJ SOLN
INTRAMUSCULAR | Status: DC | PRN
  Administered 2015-09-29: 2 mg via INTRAVENOUS

## 2015-09-29 MED ORDER — NEOSTIGMINE METHYLSULFATE 10 MG/10ML IV SOLN
INTRAVENOUS | Status: AC
  Filled 2015-09-29: qty 1

## 2015-09-29 MED ORDER — LIDOCAINE-EPINEPHRINE (PF) 1 %-1:200000 IJ SOLN
INTRAMUSCULAR | Status: AC
  Filled 2015-09-29: qty 30

## 2015-09-29 MED ORDER — CEFAZOLIN SODIUM-DEXTROSE 2-3 GM-% IV SOLR
INTRAVENOUS | Status: AC
  Filled 2015-09-29: qty 50

## 2015-09-29 MED ORDER — DIPHENHYDRAMINE HCL 50 MG/ML IJ SOLN: 12.5000 mg | Freq: Four times a day (QID) | INTRAMUSCULAR | Status: DC | PRN

## 2015-09-29 MED ORDER — HYDROMORPHONE 1 MG/ML IV SOLN
INTRAVENOUS | Status: DC
  Administered 2015-09-29: 0.6 mg via INTRAVENOUS
  Administered 2015-09-29: 2.7 mg via INTRAVENOUS
  Administered 2015-09-29: 13:00:00 via INTRAVENOUS
  Administered 2015-09-29: 0.6 mg via INTRAVENOUS
  Administered 2015-09-30: 2.1 mg via INTRAVENOUS
  Administered 2015-09-30: 0.9 mg via INTRAVENOUS
  Filled 2015-09-29: qty 25

## 2015-09-29 MED ORDER — CEFAZOLIN SODIUM-DEXTROSE 2-3 GM-% IV SOLR
2.0000 g | INTRAVENOUS | Status: AC
  Administered 2015-09-29: 2 g via INTRAVENOUS

## 2015-09-29 MED ORDER — SCOPOLAMINE 1 MG/3DAYS TD PT72
MEDICATED_PATCH | TRANSDERMAL | Status: AC
  Administered 2015-09-29: 1.5 mg via TRANSDERMAL
  Filled 2015-09-29: qty 1

## 2015-09-29 MED ORDER — FENTANYL CITRATE (PF) 250 MCG/5ML IJ SOLN
INTRAMUSCULAR | Status: AC
  Filled 2015-09-29: qty 25

## 2015-09-29 MED ORDER — GLYCOPYRROLATE 0.2 MG/ML IJ SOLN
INTRAMUSCULAR | Status: AC
  Filled 2015-09-29: qty 4

## 2015-09-29 MED ORDER — LACTATED RINGERS IV SOLN
INTRAVENOUS | Status: DC
  Administered 2015-09-29 (×2): via INTRAVENOUS

## 2015-09-29 MED ORDER — ONDANSETRON HCL 4 MG/2ML IJ SOLN
INTRAMUSCULAR | Status: AC
  Filled 2015-09-29: qty 2

## 2015-09-29 MED ORDER — LACTATED RINGERS IV SOLN
INTRAVENOUS | Status: DC
  Administered 2015-09-29 – 2015-09-30 (×2): via INTRAVENOUS

## 2015-09-29 MED ORDER — BUPIVACAINE HCL (PF) 0.25 % IJ SOLN
INTRAMUSCULAR | Status: DC | PRN
  Administered 2015-09-29: 20 mL

## 2015-09-29 MED ORDER — ONDANSETRON HCL 4 MG/2ML IJ SOLN
INTRAMUSCULAR | Status: DC | PRN
  Administered 2015-09-29: 4 mg via INTRAVENOUS

## 2015-09-29 MED ORDER — SUCCINYLCHOLINE CHLORIDE 20 MG/ML IJ SOLN
INTRAMUSCULAR | Status: AC
Start: 2015-09-29 — End: 2015-09-29
  Filled 2015-09-29: qty 1

## 2015-09-29 MED ORDER — PHENYLEPHRINE HCL 10 MG/ML IJ SOLN
INTRAMUSCULAR | Status: DC | PRN
  Administered 2015-09-29: .04 mg via INTRAVENOUS

## 2015-09-29 MED ORDER — SCOPOLAMINE 1 MG/3DAYS TD PT72
1.0000 | MEDICATED_PATCH | Freq: Once | TRANSDERMAL | Status: DC
  Administered 2015-09-29: 1.5 mg via TRANSDERMAL

## 2015-09-29 MED ORDER — ROCURONIUM BROMIDE 100 MG/10ML IV SOLN
INTRAVENOUS | Status: AC
  Filled 2015-09-29: qty 1

## 2015-09-29 MED ORDER — PROPOFOL 10 MG/ML IV BOLUS
INTRAVENOUS | Status: AC
  Filled 2015-09-29: qty 20

## 2015-09-29 MED ORDER — DIPHENHYDRAMINE HCL 12.5 MG/5ML PO ELIX
12.5000 mg | ORAL_SOLUTION | Freq: Four times a day (QID) | ORAL | Status: DC | PRN
  Administered 2015-09-29: 12.5 mg via ORAL
  Filled 2015-09-29: qty 5

## 2015-09-29 MED ORDER — GLYCOPYRROLATE 0.2 MG/ML IJ SOLN
INTRAMUSCULAR | Status: DC | PRN
  Administered 2015-09-29: 0.4 mg via INTRAVENOUS

## 2015-09-29 MED ORDER — SUCCINYLCHOLINE CHLORIDE 20 MG/ML IJ SOLN
INTRAMUSCULAR | Status: DC | PRN
  Administered 2015-09-29: 80 mg via INTRAVENOUS

## 2015-09-29 MED ORDER — LIDOCAINE HCL (CARDIAC) 20 MG/ML IV SOLN
INTRAVENOUS | Status: AC
  Filled 2015-09-29: qty 5

## 2015-09-29 MED ORDER — ONDANSETRON HCL 4 MG/2ML IJ SOLN: 4.0000 mg | Freq: Four times a day (QID) | INTRAMUSCULAR | Status: DC | PRN

## 2015-09-29 MED ORDER — FENTANYL CITRATE (PF) 100 MCG/2ML IJ SOLN
25.0000 ug | INTRAMUSCULAR | Status: AC | PRN
  Administered 2015-09-29 (×6): 25 ug via INTRAVENOUS

## 2015-09-29 MED ORDER — DEXAMETHASONE SODIUM PHOSPHATE 4 MG/ML IJ SOLN
INTRAMUSCULAR | Status: AC
  Filled 2015-09-29: qty 1

## 2015-09-29 MED ORDER — PROPOFOL 10 MG/ML IV BOLUS
INTRAVENOUS | Status: DC | PRN
Start: 2015-09-29 — End: 2015-09-29
  Administered 2015-09-29: 110 mg via INTRAVENOUS

## 2015-09-29 MED ORDER — NALOXONE HCL 0.4 MG/ML IJ SOLN
0.4000 mg | INTRAMUSCULAR | Status: DC | PRN
Start: 2015-09-29 — End: 2015-09-30

## 2015-09-29 SURGICAL SUPPLY — 46 items
BLADE SURG 10 STRL SS (BLADE) ×2 IMPLANT
BLADE SURG 15 STRL LF C SS BP (BLADE) ×3 IMPLANT
BLADE SURG 15 STRL SS (BLADE) ×4
CABLE HIGH FREQUENCY MONO STRZ (ELECTRODE) IMPLANT
CLOTH BEACON ORANGE TIMEOUT ST (SAFETY) ×4 IMPLANT
CONT PATH 16OZ SNAP LID 3702 (MISCELLANEOUS) IMPLANT
COVER BACK TABLE 60X90IN (DRAPES) ×4 IMPLANT
COVER MAYO STAND STRL (DRAPES) ×4 IMPLANT
DRAPE CESAREAN BIRTH W POUCH (DRAPES) ×2 IMPLANT
DRAPE WARM FLUID 44X44 (DRAPE) ×2 IMPLANT
DRSG COVADERM PLUS 2X2 (GAUZE/BANDAGES/DRESSINGS) ×8 IMPLANT
DRSG OPSITE POSTOP 3X4 (GAUZE/BANDAGES/DRESSINGS) IMPLANT
DRSG OPSITE POSTOP 4X10 (GAUZE/BANDAGES/DRESSINGS) ×2 IMPLANT
DURAPREP 26ML APPLICATOR (WOUND CARE) ×4 IMPLANT
ELECT LIGASURE SHORT 9 REUSE (ELECTRODE) IMPLANT
EVACUATOR SMOKE 8.L (FILTER) ×4 IMPLANT
GLOVE BIOGEL PI IND STRL 7.0 (GLOVE) ×9 IMPLANT
GLOVE BIOGEL PI INDICATOR 7.0 (GLOVE) ×3
GLOVE ECLIPSE 7.0 STRL STRAW (GLOVE) ×4 IMPLANT
LEGGING LITHOTOMY PAIR STRL (DRAPES) ×4 IMPLANT
LIQUID BAND (GAUZE/BANDAGES/DRESSINGS) ×4 IMPLANT
NS IRRIG 1000ML POUR BTL (IV SOLUTION) ×4 IMPLANT
PACK LAVH (CUSTOM PROCEDURE TRAY) ×4 IMPLANT
PACK ROBOTIC GOWN (GOWN DISPOSABLE) ×4 IMPLANT
PAD POSITIONING PINK XL (MISCELLANEOUS) ×4 IMPLANT
SCISSORS LAP 5X35 DISP (ENDOMECHANICALS) IMPLANT
SET CYSTO W/LG BORE CLAMP LF (SET/KITS/TRAYS/PACK) IMPLANT
SET IRRIG TUBING LAPAROSCOPIC (IRRIGATION / IRRIGATOR) ×4 IMPLANT
SHEARS HARMONIC ACE PLUS 36CM (ENDOMECHANICALS) IMPLANT
SLEEVE XCEL OPT CAN 5 100 (ENDOMECHANICALS) ×4 IMPLANT
SPONGE LAP 18X18 X RAY DECT (DISPOSABLE) ×4 IMPLANT
SURGIFLO W/THROMBIN 8M KIT (HEMOSTASIS) ×2 IMPLANT
SUT VIC AB 0 CT1 18XCR BRD8 (SUTURE) ×3 IMPLANT
SUT VIC AB 0 CT1 36 (SUTURE) ×4 IMPLANT
SUT VIC AB 0 CT1 8-18 (SUTURE) ×12
SUT VIC AB 4-0 KS 27 (SUTURE) ×2 IMPLANT
SUT VICRYL 0 TIES 12 18 (SUTURE) ×4 IMPLANT
SUT VICRYL 0 UR6 27IN ABS (SUTURE) ×4 IMPLANT
SUT VICRYL RAPIDE 3 0 (SUTURE) ×4 IMPLANT
TOWEL OR 17X24 6PK STRL BLUE (TOWEL DISPOSABLE) ×8 IMPLANT
TRAY FOLEY CATH SILVER 14FR (SET/KITS/TRAYS/PACK) ×4 IMPLANT
TROCAR BALLN 12MMX100 BLUNT (TROCAR) IMPLANT
TROCAR XCEL NON-BLD 11X100MML (ENDOMECHANICALS) ×8 IMPLANT
TROCAR XCEL NON-BLD 5MMX100MML (ENDOMECHANICALS) ×4 IMPLANT
WARMER LAPAROSCOPE (MISCELLANEOUS) ×4 IMPLANT
WATER STERILE IRR 1000ML POUR (IV SOLUTION) ×4 IMPLANT

## 2015-09-29 NOTE — Transfer of Care (Signed)
Immediate Anesthesia Transfer of Care Note  Patient: Brianna Velez  Procedure(s) Performed: Procedure(s): BILATERAL SALPINGECTOMY (Bilateral) HYSTERECTOMY ABDOMINAL  Patient Location: PACU  Anesthesia Type:General  Level of Consciousness: awake  Airway & Oxygen Therapy: Patient Spontanous Breathing and Patient connected to nasal cannula oxygen  Post-op Assessment: Report given to RN and Post -op Vital signs reviewed and stable  Post vital signs: Reviewed  Last Vitals:  Filed Vitals:   09/29/15 0745  BP: 106/73  Pulse: 70  Temp: 36.6 C  Resp: 18    Complications: No apparent anesthesia complications

## 2015-09-29 NOTE — Anesthesia Procedure Notes (Signed)
Procedure Name: Intubation Date/Time: 09/29/2015 9:10 AM Performed by: Jonna Munro Pre-anesthesia Checklist: Patient identified, Timeout performed, Emergency Drugs available, Suction available and Patient being monitored Patient Re-evaluated:Patient Re-evaluated prior to inductionOxygen Delivery Method: Circle system utilized Preoxygenation: Pre-oxygenation with 100% oxygen Intubation Type: IV induction Laryngoscope Size: Mac and 3 Grade View: Grade II Tube type: Oral Tube size: 7.0 mm Number of attempts: 1 Airway Equipment and Method: Stylet Placement Confirmation: ETT inserted through vocal cords under direct vision,  positive ETCO2 and breath sounds checked- equal and bilateral Secured at: 21 cm Tube secured with: Tape Dental Injury: Teeth and Oropharynx as per pre-operative assessment

## 2015-09-29 NOTE — Anesthesia Postprocedure Evaluation (Signed)
  Anesthesia Post-op Note  Patient: Brianna Velez  Procedure(s) Performed: Procedure(s) (LRB): BILATERAL SALPINGECTOMY (Bilateral) HYSTERECTOMY ABDOMINAL  Patient Location: PACU  Anesthesia Type: General  Level of Consciousness: awake and alert   Airway and Oxygen Therapy: Patient Spontanous Breathing  Post-op Pain: mild  Post-op Assessment: Post-op Vital signs reviewed, Patient's Cardiovascular Status Stable, Respiratory Function Stable, Patent Airway and No signs of Nausea or vomiting  Last Vitals:  Filed Vitals:   09/29/15 1145  BP: 124/84  Pulse: 74  Temp:   Resp: 12    Post-op Vital Signs: stable   Complications: No apparent anesthesia complications

## 2015-09-29 NOTE — Anesthesia Preprocedure Evaluation (Addendum)
Anesthesia Evaluation  Patient identified by MRN, date of birth, ID band Patient awake    Reviewed: Allergy & Precautions, NPO status , Patient's Chart, lab work & pertinent test results  History of Anesthesia Complications Negative for: history of anesthetic complications  Airway Mallampati: II  TM Distance: >3 FB Neck ROM: Full    Dental no notable dental hx. (+) Dental Advisory Given   Pulmonary neg pulmonary ROS, former smoker,    Pulmonary exam normal breath sounds clear to auscultation       Cardiovascular negative cardio ROS Normal cardiovascular exam Rhythm:Regular Rate:Normal     Neuro/Psych  Headaches, negative psych ROS   GI/Hepatic negative GI ROS, Neg liver ROS,   Endo/Other  negative endocrine ROS  Renal/GU negative Renal ROS  negative genitourinary   Musculoskeletal Chronic left leg pain    Abdominal   Peds negative pediatric ROS (+)  Hematology negative hematology ROS (+)   Anesthesia Other Findings   Reproductive/Obstetrics negative OB ROS                            Anesthesia Physical Anesthesia Plan  ASA: II  Anesthesia Plan: General   Post-op Pain Management:    Induction: Intravenous  Airway Management Planned: Oral ETT  Additional Equipment:   Intra-op Plan:   Post-operative Plan: Extubation in OR  Informed Consent: I have reviewed the patients History and Physical, chart, labs and discussed the procedure including the risks, benefits and alternatives for the proposed anesthesia with the patient or authorized representative who has indicated his/her understanding and acceptance.   Dental advisory given  Plan Discussed with: CRNA  Anesthesia Plan Comments:         Anesthesia Quick Evaluation

## 2015-09-29 NOTE — Op Note (Signed)
Thania Woodlief PROCEDURE DATE: 09/29/2015  PREOPERATIVE DIAGNOSIS:  39 y.o. [id number] with symptomatic fibroids, menorrhagia POSTOPERATIVE DIAGNOSIS:  Same SURGEON:   Mora Bellman, M.D. ASSISTANT:   Verita Schneiders, M.D. OPERATION:  Total abdominal hysterectomy, Bilateral Salpingectomy ANESTHESIA:  General endotracheal.  INDICATIONS: The patient is a 39 y.o. [id number] with history of symptomatic uterine fibroids/menorrhagia. The patient made a decision to undergo definite surgical treatment. On the preoperative visit, the risks, benefits, indications, and alternatives of the procedure were reviewed with the patient.  On the day of surgery, the risks of surgery were again discussed with the patient including but not limited to: bleeding which may require transfusion or reoperation; infection which may require antibiotics; injury to bowel, bladder, ureters or other surrounding organs; need for additional procedures; thromboembolic phenomenon, incisional problems and other postoperative/anesthesia complications. Written informed consent was obtained.    OPERATIVE FINDINGS: A 16-18 week size uterus with normal tubes and ovaries bilaterally.  ESTIMATED BLOOD LOSS: 100 ml FLUIDS:  1500 ml of Lactated Ringers URINE OUTPUT:  150 ml of clear yellow urine. SPECIMENS:  Uterus and tubes sent to pathology COMPLICATIONS:  None immediate.   DESCRIPTION OF PROCEDURE: The patient received intravenous antibiotics and had sequential compression devices applied to her lower extremities while in the preoperative area.   She was taken to the operating room where anesthesia was induced and found to be adequate. The patient was placed in supine position. The abdomen and perineum were prepped and draped in a sterile manner, and a Foley catheter was inserted into the bladder and attached to Tucker Steedley drainage. After an adequate timeout was performed, a Pfannensteil skin incision was made. This incision was taken down to the  fascia using electrocautery with care given to maintain good hemostasis. The fascia was incised in the midline and the fascial incision was then extended bilaterally using mayo scissors. The fascia was then dissected off the underlying rectus muscles using blunt and sharp dissection. The rectus muscles were split bluntly in the midline and the peritoneum entered sharply without complication. This peritoneal incision was then extended superiorly and inferiorly with care given to prevent bowel or bladder injury. Upon entry into the abdominal cavity, the upper abdomen was inspected and found to be normal. Attention was then turned to the pelvis. The bowel was packed away with moist laparotomy sponges. The uterus at this point was noted to be mobilized. The round ligaments on each side were clamped, suture ligated, and transected with electrocautery allowing entry into the broad ligament. The anterior and posterior leaves of the broad ligament were separated and the ureters were inspected to be safely away from the area of dissection bilaterally. A hole was created in the clear portion of the posterior broad ligament. Adnexae were clamped on the patient's right side. This pedicle was then clamped, cut, and doubly suture ligated with good hemostasis. This procedure was repeated in an identical fashion on the left site allowing for both adnexa to remain in place.The fallopian tubes on both sides were grasped with Baccock clamps, the underlying mesosalpinx grasped with a kelly clamp, transected and suture ligated. A bladder flap was then created across the anterior leaf of the broad ligament and the bladder was then bluntly dissected off the lower uterine segment and cervix with good hemostasis. The uterine arteries were then skeletonized bilaterally and then clamped, cut, and ligated with care given to prevent ureteral injury. The uterosacral ligaments were then clamped, cut, and ligated bilaterally. Finally, the cardinal  ligaments were clamped,  cut, and ligated bilaterally.  Acutely curved clamps were placed across the vagina just under the cervix, and the specimen was amputated and sent to pathology. The vaginal cuff was closed with a series of interrupted 0 Vicryl figure-of-eight sutures with care given to incorporate the anterior pubocervical fascia and the posterior rectovaginal fascia.  The vaginal cuff angles were noted to have good hemostasis.  The pelvis was irrigated and hemostasis was reconfirmed at all pedicles and along the pelvic sidewall.  To ensure persistence of hemostasis, Surgiflo was applied over the vaginal cuff. All laparotomy sponges and instruments were removed from the abdomen. The peritoneum was closed with 2-0 Vicryl, and the fascia was closed with 0 Vicryl in a running fashion. The subcutaneous layer was reapproximated with 2-0 plain gut. The skin was closed with a 4-0 Monocryl subcuticular stitch. Sponge, lap, needle, and instrument counts were correct times two. The patient was taken to the recovery area awake, extubated and in stable condition.

## 2015-09-30 LAB — CBC
HCT: 27.4 % — ABNORMAL LOW (ref 36.0–46.0)
Hemoglobin: 8.9 g/dL — ABNORMAL LOW (ref 12.0–15.0)
MCH: 28.3 pg (ref 26.0–34.0)
MCHC: 32.5 g/dL (ref 30.0–36.0)
MCV: 87.3 fL (ref 78.0–100.0)
Platelets: 215 10*3/uL (ref 150–400)
RBC: 3.14 MIL/uL — ABNORMAL LOW (ref 3.87–5.11)
RDW: 16 % — ABNORMAL HIGH (ref 11.5–15.5)
WBC: 7.1 10*3/uL (ref 4.0–10.5)

## 2015-09-30 MED ORDER — OXYCODONE-ACETAMINOPHEN 5-325 MG PO TABS
1.0000 | ORAL_TABLET | ORAL | Status: DC | PRN
Start: 2015-09-30 — End: 2015-10-01
  Administered 2015-09-30 (×2): 2 via ORAL
  Administered 2015-10-01 (×2): 1 via ORAL
  Filled 2015-09-30 (×2): qty 2
  Filled 2015-09-30 (×2): qty 1

## 2015-09-30 MED ORDER — IBUPROFEN 600 MG PO TABS
600.0000 mg | ORAL_TABLET | Freq: Four times a day (QID) | ORAL | Status: DC | PRN
  Administered 2015-10-01: 600 mg via ORAL
  Filled 2015-09-30: qty 1

## 2015-09-30 NOTE — Addendum Note (Signed)
Addendum  created 09/30/15 0725 by Jonna Munro, CRNA   Modules edited: Notes Section   Notes Section:  File: [id number]

## 2015-09-30 NOTE — Care Management Note (Signed)
Case Management Note  Patient Details  Name: Brianna Velez MRN: [id number] Date of Birth: Apr 19, 1976  Subjective/Objective:    39 year old female admitted 09/29/15 for TAH/BSO                Action/Plan:D/C when medically stable   Additional Comments:CM received consult for  medication assistance.  CM met with pt in hospital room to discuss medication needs.  Per pt, her Medicaid is active now and she has no needs.  Did leave message with financial counselor as pt is listed as self pay in EPIC.  Please re-consult if needed.  Micaila Ziemba RNC-MNN, BSN 09/30/2015, 11:27 AM

## 2015-09-30 NOTE — Anesthesia Postprocedure Evaluation (Signed)
  Anesthesia Post-op Note  Patient: Brianna Velez  Procedure(s) Performed: Procedure(s): BILATERAL SALPINGECTOMY (Bilateral) HYSTERECTOMY ABDOMINAL  Patient Location: [facility]  Anesthesia Type:General  Level of Consciousness: awake, alert  and oriented  Airway and Oxygen Therapy: Patient Spontanous Breathing and Patient connected to nasal cannula oxygen  Post-op Pain: none  Post-op Assessment: Post-op Vital signs reviewed, Patient's Cardiovascular Status Stable, Respiratory Function Stable, Patent Airway, No signs of Nausea or vomiting, Adequate PO intake and Pain level controlled              Post-op Vital Signs: Reviewed and stable  Last Vitals:  Filed Vitals:   09/30/15 0613  BP:   Pulse:   Temp:   Resp: 16    Complications: No apparent anesthesia complications

## 2015-09-30 NOTE — Progress Notes (Signed)
1 Day Post-Op Procedure(s) (LRB): BILATERAL SALPINGECTOMY (Bilateral) HYSTERECTOMY ABDOMINAL  Subjective: Patient reports feeling well. She ambulated, tolerated diet, denies nausea/emesis. She denies chest pain, SOB, lightheadedness/dizziness    Objective: I have reviewed patient's vital signs, intake and output, medications and labs.  General: alert, cooperative and no distress Resp: clear to auscultation bilaterally Cardio: regular rate and rhythm GI: incision: clean, dry and intact and abdomen is soft, appropriately tender and non distended Extremities: Homans sign is negative, no sign of DVT and no edema, redness or tenderness in the calves or thighs    Assessment: s/p Procedure(s): BILATERAL SALPINGECTOMY (Bilateral) HYSTERECTOMY ABDOMINAL: stable, progressing well and tolerating diet  Plan: Encourage ambulation  Discharge planning in the morning  LOS: 1 day    Brianna Velez 09/30/2015, 11:51 AM

## 2015-10-01 ENCOUNTER — Encounter (HOSPITAL_COMMUNITY): Payer: Self-pay | Admitting: Obstetrics and Gynecology

## 2015-10-01 MED ORDER — DOCUSATE SODIUM 100 MG PO CAPS: 100.0000 mg | ORAL_CAPSULE | Freq: Two times a day (BID) | ORAL | Status: DC | PRN

## 2015-10-01 MED ORDER — IBUPROFEN 600 MG PO TABS: 600.0000 mg | ORAL_TABLET | Freq: Four times a day (QID) | ORAL | Status: DC | PRN

## 2015-10-01 MED ORDER — BISACODYL 10 MG RE SUPP
10.0000 mg | Freq: Once | RECTAL | Status: AC
  Administered 2015-10-01: 10 mg via RECTAL
  Filled 2015-10-01: qty 1

## 2015-10-01 MED ORDER — OXYCODONE-ACETAMINOPHEN 5-325 MG PO TABS: 1.0000 | ORAL_TABLET | ORAL | Status: DC | PRN

## 2015-10-01 NOTE — Discharge Instructions (Signed)
Abdominal Hysterectomy, Care After °Refer to this sheet in the next few weeks. These instructions provide you with information on caring for yourself after your procedure. Your health care provider may also give you more specific instructions. Your treatment has been planned according to current medical practices, but problems sometimes occur. Call your health care provider if you have any problems or questions after your procedure.  °WHAT TO EXPECT AFTER THE PROCEDURE °After your procedure, it is typical to have the following: °· Pain. °· Feeling tired. °· Poor appetite. °· Less interest in sex. °It takes 4-6 weeks to recover from this surgery.  °HOME CARE INSTRUCTIONS  °· Take pain medicines only as directed by your health care provider. Do not take over-the-counter pain medicines without checking with your health care provider first.  °· Change your bandage as directed by your health care provider. °· Return to your health care provider to have your sutures taken out. °· Take showers instead of baths for 2-3 weeks. Ask your health care provider when it is safe to start showering.  °· Do not douche, use tampons, or have sexual intercourse for at least 6 weeks or until your health care provider says you can.   °· Follow your health care provider's advice about exercise, lifting, driving, and general activities. °· Get plenty of rest and sleep.   °· Do not lift anything heavier than a gallon of milk (about 10 lb [4.5 kg]) for the first month after surgery. °· You can resume your normal diet if your health care provider says it is okay.   °· Do not drink alcohol until your health care provider says you can.   °· If you are constipated, ask your health care provider if you can take a mild laxative. °· Eating foods high in fiber may also help with constipation. Eat plenty of raw fruits and vegetables, whole grains, and beans. °· Drink enough fluids to keep your urine clear or pale yellow.   °· Try to have someone at  home with you for the first 1-2 weeks to help around the house. °· Keep all follow-up appointments. °SEEK MEDICAL CARE IF:  °· You have chills or fever. °· You have swelling, redness, or pain in the area of your incision that is getting worse.   °· You have pus coming from the incision.   °· You notice a bad smell coming from the incision or bandage.   °· Your incision breaks open.   °· You feel dizzy or light-headed.   °· You have pain or bleeding when you urinate.   °· You have persistent diarrhea.   °· You have persistent nausea and vomiting.   °· You have abnormal vaginal discharge.   °· You have a rash.   °· You have any type of abnormal reaction or develop an allergy to your medicine.   °· Your pain medicine is not helping.   °SEEK IMMEDIATE MEDICAL CARE IF:  °· You have a fever and your symptoms suddenly get worse. °· You have severe abdominal pain. °· You have chest pain. °· You have shortness of breath. °· You faint. °· You have pain, swelling, or redness of your leg. °· You have heavy vaginal bleeding with blood clots. °MAKE SURE YOU: °· Understand these instructions. °· Will watch your condition. °· Will get help right away if you are not doing well or get worse. °  °This information is not intended to replace advice given to you by your health care provider. Make sure you discuss any questions you have with your health care provider. °  °Document   Released: 06/10/2005 Document Revised: 12/12/2014 Document Reviewed: 09/13/2013 °Elsevier Interactive Patient Education ©2016 [employer] ° °

## 2015-10-01 NOTE — Discharge Summary (Signed)
Physician Discharge Summary  Patient ID: Brianna Velez MRN: [id number] DOB/AGE: 41-Feb-1977 41 y.o.  Admit date: 09/29/2015 Discharge date: 10/01/2015  Admission Diagnoses: Scheduled hysterectomy secondary to fibroid and menorrhagia  Discharge Diagnoses:  Active Problems:   Fibroid, uterine   Menorrhagia with regular cycle   S/P TAH (total abdominal hysterectomy)   Discharged Condition: good  Hospital Course: Patient admitted for scheduled surgical management of fibroid and menorrhagia with hysterectomy. She underwent and uncomplicated TAH with bilateral salpingectomy with the removal of a fibroid uterus weighing 508 gm with benign pathology. Her post op course was uncomplicated and on post op day #2 she was found stable for discharge. She was ambulating, passing flatus, tolerating po and voiding without complications  Consults: None  Significant Diagnostic Studies: labs: pre op Hg 10; post op 8.2  Treatments: surgery: TAH with bilateral salpingectomy  Discharge Exam: Blood pressure 98/59, pulse 72, temperature 98.2 F (36.8 C), temperature source Oral, resp. rate 18, height 5\' 4"  (1.626 m), weight 92 lb (41.731 kg), SpO2 100 %. General appearance: alert, cooperative and no distress Resp: clear to auscultation bilaterally Cardio: regular rate and rhythm GI: soft, non-tender; bowel sounds normal; no masses,  no organomegaly Extremities: Homans sign is negative, no sign of DVT Incision/Wound:clean dry and intact  Disposition: 01-Home or Self Care     Medication List    STOP taking these medications        traMADol 50 MG tablet  Commonly known as:  ULTRAM      TAKE these medications        docusate sodium 100 MG capsule  Commonly known as:  COLACE  Take 1 capsule (100 mg total) by mouth 2 (two) times daily as needed.     ferrous sulfate 325 (65 FE) MG EC tablet  Take 325 mg by mouth daily with breakfast.     ibuprofen 600 MG tablet  Commonly known as:   ADVIL,MOTRIN  Take 1 tablet (600 mg total) by mouth every 6 (six) hours as needed for fever or headache.     oxyCODONE-acetaminophen 5-325 MG tablet  Commonly known as:  PERCOCET/ROXICET  Take 1-2 tablets by mouth every 4 (four) hours as needed for severe pain.         Signed: Dinna Severs 10/01/2015, 12:18 PM

## 2015-10-01 NOTE — Progress Notes (Signed)
Pt verbalizes understanding of d/c instructions, medications, follow up appts, when to seek medical attention and belongings policy. No IV at this time. Pt has no questions and states that her physician has reviewed incision care  & she is comfortable with that. Pts daughter is at bedside. Pt is eating lunch and will call when she is finished and ready to be escorted out. Marry Guan

## 2015-10-01 NOTE — Progress Notes (Addendum)
Pt being d/c at this time, via wheelchair, accompanied by NT. NT reported that pt wanted to wait at main entrance for her ride. Marry Guan

## 2015-10-05 ENCOUNTER — Telehealth: Payer: Self-pay | Admitting: General Practice

## 2015-10-05 NOTE — Telephone Encounter (Signed)
Patient called and left message stating she needs to know how she is supposed to clean her incision. Called patient back and she states she took the tape off today like Dr Elly Modena told her but she forgot to ask how to clean it. Told patient to let the warm soapy water run over her incision followed by clean water. Do not rub or scrub incision and then get out of the shower and thoroughly but gently dry incision. Patient verbalized understanding to all and had no questions

## 2015-10-07 ENCOUNTER — Telehealth: Payer: Self-pay | Admitting: *Deleted

## 2015-10-07 ENCOUNTER — Encounter: Payer: Self-pay | Admitting: *Deleted

## 2015-10-07 NOTE — Telephone Encounter (Signed)
Brianna Velez called and left a message today stating she wants to know if there is any kind of antiseptic or cream that she should use to clean surgical wounds.   Per chart had salpingectomy 09/29/15.  [name] and we discussed that we only advise taking daily showers and keeping wound clean with soap and water- not any other antiseptic products.  She voices understanding. Also discussed no tub baths until wound has healed.

## 2015-10-14 ENCOUNTER — Encounter (HOSPITAL_COMMUNITY): Payer: Self-pay

## 2015-10-14 ENCOUNTER — Inpatient Hospital Stay (HOSPITAL_COMMUNITY)
Admission: AD | Admit: 2015-10-14 | Discharge: 2015-10-14 | Disposition: A | Discharge status: Home / Self Care | Payer: Medicaid Other | Source: Ambulatory Visit | Attending: Family Medicine | Admitting: Family Medicine

## 2015-10-14 DIAGNOSIS — Z90710 Acquired absence of both cervix and uterus: Secondary | ICD-10-CM | POA: Insufficient documentation

## 2015-10-14 DIAGNOSIS — B373 Candidiasis of vulva and vagina: Secondary | ICD-10-CM | POA: Diagnosis not present

## 2015-10-14 DIAGNOSIS — B3731 Acute candidiasis of vulva and vagina: Secondary | ICD-10-CM

## 2015-10-14 DIAGNOSIS — Z87891 Personal history of nicotine dependence: Secondary | ICD-10-CM | POA: Insufficient documentation

## 2015-10-14 DIAGNOSIS — L7682 Other postprocedural complications of skin and subcutaneous tissue: Secondary | ICD-10-CM

## 2015-10-14 LAB — WET PREP, GENITAL: Trich, Wet Prep: NONE SEEN

## 2015-10-14 MED ORDER — OXYCODONE-ACETAMINOPHEN 5-325 MG PO TABS: 1.0000 | ORAL_TABLET | Freq: Four times a day (QID) | ORAL | Status: DC | PRN

## 2015-10-14 MED ORDER — FLUCONAZOLE 150 MG PO TABS: 150.0000 mg | ORAL_TABLET | Freq: Once | ORAL | Status: DC

## 2015-10-14 NOTE — MAU Provider Note (Signed)
History     CSN: [id number]  Arrival date and time: 10/14/15 1726   First Provider Initiated Contact with Patient 10/14/15 1823      Chief Complaint  Patient presents with  . Vaginal Discharge   HPI This is a 39 y.o. female who is post hysterectomy from 09/29/15 who presents with complaints of vaginal itching and thick white discharge for the last 2 days. Also c/o incisional pain, and has run out of Percocet. Has been taking 2-3 ibuprofen every 3 hours for pain. Denies fever or nausea/vomiting.   RN Note: Had abdominal hysterectomy on 10/25 onset of vaginal itching and white discharge since yesterday had abdominal pain earlier took [facility] powder ran out of Percocet, 0/10 OB History    Gravida Para Term Preterm AB TAB SAB Ectopic Multiple Living   2 2 2  0 0 0 0 0 0 2      Past Medical History  Diagnosis Date  . Migraine   . Leg pain   . Fibroids   . History of hiatal hernia     Past Surgical History  Procedure Laterality Date  . Tubal ligation    . Wisdom tooth extraction    . Bilateral salpingectomy Bilateral 09/29/2015    Procedure: BILATERAL SALPINGECTOMY;  Surgeon: Mora Bellman, MD;  Location: [facility];  Service: Gynecology;  Laterality: Bilateral;  . Abdominal hysterectomy  09/29/2015    Procedure: HYSTERECTOMY ABDOMINAL;  Surgeon: Mora Bellman, MD;  Location: [facility];  Service: Gynecology;;    Family History  Problem Relation Age of Onset  . Diabetes Sister   . Heart disease Paternal Grandmother   . Hypertension Father   . Dementia Mother   . Hypertension Mother     Social History  Substance Use Topics  . Smoking status: Former Research scientist (life sciences)  . Smokeless tobacco: Never Used  . Alcohol Use: No    Allergies: No Known Allergies  Prescriptions prior to admission  Medication Sig Dispense Refill Last Dose  . docusate sodium (COLACE) 100 MG capsule Take 1 capsule (100 mg total) by mouth 2 (two) times daily as needed. 30 capsule 2   . ferrous sulfate 325 (65 FE) MG  EC tablet Take 325 mg by mouth daily with breakfast.   Past Month at Unknown time  . ibuprofen (ADVIL,MOTRIN) 600 MG tablet Take 1 tablet (600 mg total) by mouth every 6 (six) hours as needed for fever or headache. 30 tablet 2   . oxyCODONE-acetaminophen (PERCOCET/ROXICET) 5-325 MG tablet Take 1-2 tablets by mouth every 4 (four) hours as needed for severe pain. 30 tablet 0    Medical, Surgical, Family and Social histories reviewed and are listed above.  Medications and allergies reviewed.   Review of Systems  Constitutional: Negative for fever, chills and malaise/fatigue.  Gastrointestinal: Positive for abdominal pain. Negative for nausea, vomiting, diarrhea and constipation.  Musculoskeletal: Negative for myalgias.  Neurological: Negative for headaches.   Physical Exam   Blood pressure 118/73, pulse 97, temperature 97.9 F (36.6 C), temperature source Oral, resp. rate 16.  Physical Exam  Constitutional: She is oriented to person, place, and time. She appears well-developed and well-nourished. No distress.  HENT:  Head: Normocephalic.  Cardiovascular: Normal rate.   Respiratory: Effort normal. No respiratory distress.  GI: Soft. She exhibits no distension and no mass. There is tenderness. There is no rebound and no guarding.  Genitourinary: Vaginal discharge (thick white cottage cheese discharge with no erethema, but pt is very tender) found.  Musculoskeletal: Normal range  of motion.  Neurological: She is alert and oriented to person, place, and time.  Skin: Skin is warm and dry.  Psychiatric: She has a normal mood and affect.    MAU Course  Procedures  MDM Wet prep sent Obvious yeast vaginitis Will treat that with Diflucan and give Rx for a few new Percocet Results for orders placed or performed during the hospital encounter of 10/14/15 (from the past 24 hour(s))  Wet prep, genital     Status: Abnormal   Collection Time: 10/14/15  6:30 PM  Result Value Ref Range   Yeast  Wet Prep HPF POC MODERATE (A) NONE SEEN   Trich, Wet Prep NONE SEEN NONE SEEN   Clue Cells Wet Prep HPF POC FEW (A) NONE SEEN   WBC, Wet Prep HPF POC MODERATE (A) NONE SEEN    Assessment and Plan  A:  Postoperative Day # 15      Yeast vaginitis  P:  Discharge home       Rx Diflucan with one refill to use in 5 days if not feeling better       Rx Percocet #10 tab       Follow up in clinic for postop visit  [facility] 10/14/2015, 6:24 PM

## 2015-10-14 NOTE — MAU Note (Signed)
Had abdominal hysterectomy on 10/25 onset of vaginal itching and white discharge since yesterday had abdominal pain earlier took [facility] powder ran out of Percocet, 0/10.

## 2015-10-14 NOTE — Discharge Instructions (Signed)

## 2015-10-20 ENCOUNTER — Emergency Department (HOSPITAL_COMMUNITY)
Admission: EM | Admit: 2015-10-20 | Discharge: 2015-10-21 | Disposition: A | Discharge status: Home / Self Care | Payer: Medicaid Other | Attending: Emergency Medicine | Admitting: Emergency Medicine

## 2015-10-20 ENCOUNTER — Encounter (HOSPITAL_COMMUNITY): Payer: Self-pay | Admitting: Emergency Medicine

## 2015-10-20 DIAGNOSIS — G43909 Migraine, unspecified, not intractable, without status migrainosus: Secondary | ICD-10-CM | POA: Diagnosis not present

## 2015-10-20 DIAGNOSIS — Z9851 Tubal ligation status: Secondary | ICD-10-CM | POA: Diagnosis not present

## 2015-10-20 DIAGNOSIS — Z86018 Personal history of other benign neoplasm: Secondary | ICD-10-CM | POA: Diagnosis not present

## 2015-10-20 DIAGNOSIS — R109 Unspecified abdominal pain: Secondary | ICD-10-CM

## 2015-10-20 DIAGNOSIS — G8929 Other chronic pain: Secondary | ICD-10-CM | POA: Diagnosis not present

## 2015-10-20 DIAGNOSIS — Z9889 Other specified postprocedural states: Secondary | ICD-10-CM | POA: Diagnosis not present

## 2015-10-20 DIAGNOSIS — R1030 Lower abdominal pain, unspecified: Secondary | ICD-10-CM | POA: Diagnosis not present

## 2015-10-20 DIAGNOSIS — J029 Acute pharyngitis, unspecified: Secondary | ICD-10-CM | POA: Insufficient documentation

## 2015-10-20 DIAGNOSIS — Z87891 Personal history of nicotine dependence: Secondary | ICD-10-CM | POA: Diagnosis not present

## 2015-10-20 DIAGNOSIS — R000 Tachycardia, unspecified: Secondary | ICD-10-CM | POA: Diagnosis not present

## 2015-10-20 DIAGNOSIS — Z90710 Acquired absence of both cervix and uterus: Secondary | ICD-10-CM | POA: Diagnosis not present

## 2015-10-20 DIAGNOSIS — Z7982 Long term (current) use of aspirin: Secondary | ICD-10-CM | POA: Insufficient documentation

## 2015-10-20 LAB — BASIC METABOLIC PANEL
Anion gap: 7 (ref 5–15)
BUN: 5 mg/dL — ABNORMAL LOW (ref 6–20)
CO2: 25 mmol/L (ref 22–32)
Calcium: 9.1 mg/dL (ref 8.9–10.3)
Chloride: 102 mmol/L (ref 101–111)
Creatinine, Ser: 0.62 mg/dL (ref 0.44–1.00)
GFR calc Af Amer: >60 mL/min (ref >60)
GFR calc non Af Amer: >60 mL/min (ref >60)
Glucose, Bld: 101 mg/dL — ABNORMAL HIGH (ref 65–99)
Potassium: 4.5 mmol/L (ref 3.5–5.1)
Sodium: 134 mmol/L — ABNORMAL LOW (ref 135–145)

## 2015-10-20 LAB — CBC WITH DIFFERENTIAL/PLATELET
Basophils Absolute: 0 10*3/uL (ref 0.0–0.1)
Basophils Relative: 0 %
Eosinophils Absolute: 0 10*3/uL (ref 0.0–0.7)
Eosinophils Relative: 0 %
HCT: 35.1 % — ABNORMAL LOW (ref 36.0–46.0)
Hemoglobin: 11.1 g/dL — ABNORMAL LOW (ref 12.0–15.0)
Lymphocytes Relative: 8 %
Lymphs Abs: 0.7 10*3/uL (ref 0.7–4.0)
MCH: 27.5 pg (ref 26.0–34.0)
MCHC: 31.6 g/dL (ref 30.0–36.0)
MCV: 86.9 fL (ref 78.0–100.0)
Monocytes Absolute: 0.4 10*3/uL (ref 0.1–1.0)
Monocytes Relative: 4 %
Neutro Abs: 8.2 10*3/uL — ABNORMAL HIGH (ref 1.7–7.7)
Neutrophils Relative %: 88 %
Platelets: 305 10*3/uL (ref 150–400)
RBC: 4.04 MIL/uL (ref 3.87–5.11)
RDW: 14.5 % (ref 11.5–15.5)
WBC: 9.3 10*3/uL (ref 4.0–10.5)

## 2015-10-20 LAB — I-STAT CG4 LACTIC ACID, ED: Lactic Acid, Venous: 0.88 mmol/L (ref 0.5–2.0)

## 2015-10-20 LAB — POC OCCULT BLOOD, ED: Fecal Occult Bld: NEGATIVE

## 2015-10-20 MED ORDER — MORPHINE SULFATE (PF) 4 MG/ML IV SOLN
4.0000 mg | Freq: Once | INTRAVENOUS | Status: AC
  Administered 2015-10-21: 4 mg via INTRAMUSCULAR

## 2015-10-20 MED ORDER — DEXAMETHASONE SODIUM PHOSPHATE 10 MG/ML IJ SOLN: 10.0000 mg | Freq: Once | INTRAMUSCULAR | Status: DC

## 2015-10-20 MED ORDER — MORPHINE SULFATE (PF) 4 MG/ML IV SOLN
4.0000 mg | Freq: Once | INTRAVENOUS | Status: DC
  Filled 2015-10-20: qty 1

## 2015-10-20 MED ORDER — ONDANSETRON HCL 4 MG/2ML IJ SOLN: 4.0000 mg | Freq: Once | INTRAMUSCULAR | Status: DC

## 2015-10-20 MED ORDER — SODIUM CHLORIDE 0.9 % IV BOLUS (SEPSIS)
1000.0000 mL | Freq: Once | INTRAVENOUS | Status: AC
  Administered 2015-10-21: 1000 mL via INTRAVENOUS

## 2015-10-20 NOTE — ED Provider Notes (Signed)
CSN: [id number]     Arrival date & time 10/20/15  2203 History   By signing my name below, I, Forrestine Him, attest that this documentation has been prepared under the direction and in the presence of [name], MD.  Electronically Signed: Forrestine Him, ED Scribe. 10/20/2015. 12:16 AM.   Chief Complaint  Patient presents with  . Sore Throat  . Headache   The history is provided by the patient. No language interpreter was used.    HPI Comments: Brianna Velez is a 39 y.o. female who presents to the Emergency Department complaining of a constant, ongoing, worsening HA onset early this morning. Pain is described as throbbing. Described as similar to past migraines. Ongoing sore throat along with 2-3 episodes of vomiting also reported. No aggravating or alleviating factors at this time. No recent fever, chills, chest pain, or shortness of breath. Ms. Decena recently underwent an abdominal hysterectomy on 10/25 and reports ongoing soreness to incision site. Pt recently followed up with her midwife and was given a second prescription for Percocet post surgery. However, she is now out of this medication. Last dose 2 days ago. Next follow up with OB/GYN 11/30.  PCP: Angelica Chessman, MD    Past Medical History  Diagnosis Date  . Migraine   . Leg pain   . Fibroids   . History of hiatal hernia    Past Surgical History  Procedure Laterality Date  . Tubal ligation    . Wisdom tooth extraction    . Bilateral salpingectomy Bilateral 09/29/2015    Procedure: BILATERAL SALPINGECTOMY;  Surgeon: Mora Bellman, MD;  Location: [facility];  Service: Gynecology;  Laterality: Bilateral;  . Abdominal hysterectomy  09/29/2015    Procedure: HYSTERECTOMY ABDOMINAL;  Surgeon: Mora Bellman, MD;  Location: [facility];  Service: Gynecology;;   Family History  Problem Relation Age of Onset  . Diabetes Sister   . Heart disease Paternal Grandmother   . Hypertension Father   . Dementia Mother   .  Hypertension Mother    Social History  Substance Use Topics  . Smoking status: Former Research scientist (life sciences)  . Smokeless tobacco: Never Used  . Alcohol Use: No   OB History    Gravida Para Term Preterm AB TAB SAB Ectopic Multiple Living   2 2 2  0 0 0 0 0 0 2     Review of Systems  Constitutional: Negative for fever and chills.  HENT: Positive for sore throat. Negative for congestion and rhinorrhea.   Respiratory: Negative for cough and shortness of breath.   Cardiovascular: Negative for chest pain, palpitations and leg swelling.  Gastrointestinal: Positive for nausea, vomiting and abdominal pain. Negative for diarrhea and constipation.  Genitourinary: Negative for dysuria, frequency, hematuria, flank pain, vaginal bleeding and vaginal discharge.  Musculoskeletal: Negative for back pain.  Skin: Negative for rash.  Neurological: Positive for headaches. Negative for dizziness, weakness and numbness.  Psychiatric/Behavioral: Negative for confusion.  All other systems reviewed and are negative.     Allergies  Review of patient's allergies indicates no known allergies.  Home Medications   Prior to Admission medications   Medication Sig Start Date End Date Taking? Authorizing Provider  Aspirin-Salicylamide-Caffeine (BC HEADACHE POWDER PO) Take 1 Package by mouth daily as needed (pain).   Yes Historical Provider, MD  oxyCODONE-acetaminophen (PERCOCET/ROXICET) 5-325 MG tablet Take 1-2 tablets by mouth every 6 (six) hours as needed. Patient taking differently: Take 1-2 tablets by mouth every 6 (six) hours as needed for moderate pain.  10/14/15  Yes Seabron Spates, CNM   Triage Vitals: BP 98/62 mmHg  Pulse 81  Temp(Src) 99.6 F (37.6 C) (Oral)  Resp 16  SpO2 100%  LMP 08/15/2015 (Approximate)   Physical Exam  Constitutional: She is oriented to person, place, and time. She appears well-developed and well-nourished. No distress.  HENT:  Head: Normocephalic and atraumatic.  Mouth/Throat:  Oropharynx is clear and moist. No oropharyngeal exudate.  Oropharynx is erythematous. No definite exudates. Uvula is midline.  Eyes: EOM are normal. Pupils are equal, round, and reactive to light.  Neck: Normal range of motion. Neck supple.  No meningismus  Cardiovascular: Regular rhythm.  Exam reveals no gallop and no friction rub.   No murmur heard. Mild tachycardia  Pulmonary/Chest: Effort normal and breath sounds normal. No respiratory distress. She has no wheezes. She has no rales. She exhibits no tenderness.  Abdominal: Soft. Bowel sounds are normal. She exhibits no distension and no mass. There is tenderness. There is no rebound and no guarding.  Abdomen soft. Well-healing central scarring. Mild tenderness in the lower abdomen without focality, rebound or guarding.  Musculoskeletal: Normal range of motion. She exhibits no edema or tenderness.  No lower extremity swelling or pain.  Lymphadenopathy:    She has no cervical adenopathy.  Neurological: She is alert and oriented to person, place, and time.  Moves all extremities without deficit. Sensation is fully intact.  Skin: Skin is warm and dry. No rash noted. No erythema.  Psychiatric:  Tearful  Nursing note and vitals reviewed.   ED Course  Procedures (including critical care time)  DIAGNOSTIC STUDIES: Oxygen Saturation is 98% on RA, Normal by my interpretation.    COORDINATION OF CARE: 12:07 AM- Will give Morphine, Reglan, fluids, Solu-Medrol. Will order Lipase, Hepatic function panel, urinalysis, rapid strep screen, CBC, i-stat CG4 lactic acid, and BMP. Discussed treatment plan with pt at bedside and pt agreed to plan.     Labs Review Labs Reviewed  CBC WITH DIFFERENTIAL/PLATELET - Abnormal; Notable for the following:    Hemoglobin 11.1 (*)    HCT 35.1 (*)    Neutro Abs 8.2 (*)    All other components within normal limits  BASIC METABOLIC PANEL - Abnormal; Notable for the following:    Sodium 134 (*)    Glucose, Bld  101 (*)    BUN 5 (*)    All other components within normal limits  URINALYSIS, ROUTINE W REFLEX MICROSCOPIC (NOT AT [facility]) - Abnormal; Notable for the following:    APPearance HAZY (*)    Ketones, ur 15 (*)    Protein, ur 30 (*)    All other components within normal limits  HEPATIC FUNCTION PANEL - Abnormal; Notable for the following:    ALT 9 (*)    Bilirubin, Direct <0.1 (*)    All other components within normal limits  URINE MICROSCOPIC-ADD ON - Abnormal; Notable for the following:    Squamous Epithelial / LPF 6-30 (*)    Bacteria, UA MANY (*)    All other components within normal limits  RAPID STREP SCREEN (NOT AT [facility])  CULTURE, GROUP A STREP  LIPASE, BLOOD  I-STAT CG4 LACTIC ACID, ED  POC OCCULT BLOOD, ED    Imaging Review No results found. I have personally reviewed and evaluated these images and lab results as part of my medical decision-making.   EKG Interpretation None      MDM   Final diagnoses:  Pharyngitis  Migraine without status migrainosus, not  intractable, unspecified migraine type  Chronic abdominal pain     I personally performed the services described in this documentation, which was scribed in my presence. The recorded information has been reviewed and is accurate.   Patient states she is feeling much better after IV fluids and medication. Strep negative. Likely viral pharyngitis. Patient has a normal neurologic exam. Abdomen is soft with mild lower abdominal tenderness that is to be expected postoperatively. No evidence for infection. We'll discharge home to follow-up with her OB/GYN.  [name], MD 10/21/15 [phone]

## 2015-10-20 NOTE — ED Notes (Signed)
Pt. reports sore throat with mild headache onset this week , denies fever , no oral swelling / respirations unlabored .

## 2015-10-20 NOTE — ED Provider Notes (Signed)
MSE was initiated and I personally evaluated the patient and placed orders (if any) at  10:51 PM on October 20, 2015.   Brianna Velez is a 39 y.o. female who presents to the Emergency Department complaining of worsening left-sided sore throat that began earlier today. She reports associated HA located at bilateral temples. She states she has not been able to eat or drink anything due to the pain. She has taken Ultram for her symptoms. She denies modifying factors. She denies nausea, vomiting, rash, fever or chills.She states she has been urinating without issue and reports some bowel movements.  She reports having a hysterectomy on 09/29/15 (three weeks ago) and states she was intubated for the procedure. She reports moderate soreness of the incision site. Last night she was having severe pain, husband reports that she was balled up and crying in bed because she was in such severe pain to her suprapubic region. She took some of her mother in laws Ultram and it helped some.   PE: Pt appears sick, dry lips, tired. She is holding abdomen. Her voice is quite and slightly raspy. She has erythematous tonsils with small amount of pustules. No obvious abscess or gross asymmetrical swelling noted. Abdomen is tender to the touch but without rigidity. Patient is tachycardic and afebrile.  Patient is tachycardic between 118-120, she appears ill. Sore throat and recent hysterectomy with worsening post op pain last night. Patient to be moved to the acute side.  Labs: CBC, BMP, lactic acid, rapid strep Interventions initiated:  Medications  sodium chloride 0.9 % bolus 1,000 mL (not administered)  dexamethasone (DECADRON) injection 10 mg (not administered)  ondansetron (ZOFRAN) injection 4 mg (not administered)  morphine 4 MG/ML injection 4 mg (not administered)   The patient appears stable so that the remainder of the MSE may be completed by another provider. he patient appears stable so that the remainder of  the MSE may be completed by another provider.    Delos Haring, PA-C 10/20/15 2258  [name], MD 10/27/15 [phone]

## 2015-10-21 LAB — URINALYSIS, ROUTINE W REFLEX MICROSCOPIC
Bilirubin Urine: NEGATIVE
Glucose, UA: NEGATIVE mg/dL
Hgb urine dipstick: NEGATIVE
Ketones, ur: 15 mg/dL — AB
Leukocytes, UA: NEGATIVE
Nitrite: NEGATIVE
Protein, ur: 30 mg/dL — AB
Specific Gravity, Urine: 1.018 (ref 1.005–1.030)
pH: 6 (ref 5.0–8.0)

## 2015-10-21 LAB — URINE MICROSCOPIC-ADD ON

## 2015-10-21 LAB — LIPASE, BLOOD: Lipase: 23 U/L (ref 11–51)

## 2015-10-21 LAB — HEPATIC FUNCTION PANEL
ALT: 9 U/L — ABNORMAL LOW (ref 14–54)
AST: 19 U/L (ref 15–41)
Albumin: 3.8 g/dL (ref 3.5–5.0)
Alkaline Phosphatase: 41 U/L (ref 38–126)
Bilirubin, Direct: <0.1 mg/dL — ABNORMAL LOW (ref 0.1–0.5)
Total Bilirubin: 0.4 mg/dL (ref 0.3–1.2)
Total Protein: 7.4 g/dL (ref 6.5–8.1)

## 2015-10-21 LAB — RAPID STREP SCREEN (MED CTR MEBANE ONLY): Streptococcus, Group A Screen (Direct): NEGATIVE

## 2015-10-21 MED ORDER — METHYLPREDNISOLONE SODIUM SUCC 125 MG IJ SOLR
INTRAMUSCULAR | Status: AC
  Filled 2015-10-21: qty 2

## 2015-10-21 MED ORDER — METOCLOPRAMIDE HCL 5 MG/ML IJ SOLN
INTRAMUSCULAR | Status: AC
  Filled 2015-10-21: qty 2

## 2015-10-21 MED ORDER — METOCLOPRAMIDE HCL 10 MG PO TABS: 10.0000 mg | ORAL_TABLET | Freq: Four times a day (QID) | ORAL | Status: DC | PRN

## 2015-10-21 MED ORDER — SODIUM CHLORIDE 0.9 % IV BOLUS (SEPSIS): 1000.0000 mL | Freq: Once | INTRAVENOUS | Status: DC

## 2015-10-21 MED ORDER — METOCLOPRAMIDE HCL 5 MG/ML IJ SOLN
10.0000 mg | Freq: Once | INTRAMUSCULAR | Status: AC
  Administered 2015-10-21: 10 mg via INTRAVENOUS

## 2015-10-21 MED ORDER — METHYLPREDNISOLONE SODIUM SUCC 125 MG IJ SOLR
125.0000 mg | Freq: Once | INTRAMUSCULAR | Status: AC
  Administered 2015-10-21: 125 mg via INTRAVENOUS

## 2015-10-21 MED ORDER — IBUPROFEN 600 MG PO TABS: 600.0000 mg | ORAL_TABLET | Freq: Four times a day (QID) | ORAL | Status: DC | PRN

## 2015-10-21 NOTE — Discharge Instructions (Signed)
Pharyngitis Pharyngitis is redness, pain, and swelling (inflammation) of your pharynx.  CAUSES  Pharyngitis is usually caused by infection. Most of the time, these infections are from viruses (viral) and are part of a cold. However, sometimes pharyngitis is caused by bacteria (bacterial). Pharyngitis can also be caused by allergies. Viral pharyngitis may be spread from person to person by coughing, sneezing, and personal items or utensils (cups, forks, spoons, toothbrushes). Bacterial pharyngitis may be spread from person to person by more intimate contact, such as kissing.  SIGNS AND SYMPTOMS  Symptoms of pharyngitis include:   Sore throat.   Tiredness (fatigue).   Low-grade fever.   Headache.  Joint pain and muscle aches.  Skin rashes.  Swollen lymph nodes.  Plaque-like film on throat or tonsils (often seen with bacterial pharyngitis). DIAGNOSIS  Your health care provider will ask you questions about your illness and your symptoms. Your medical history, along with a physical exam, is often all that is needed to diagnose pharyngitis. Sometimes, a rapid strep test is done. Other lab tests may also be done, depending on the suspected cause.  TREATMENT  Viral pharyngitis will usually get better in 3-4 days without the use of medicine. Bacterial pharyngitis is treated with medicines that kill germs (antibiotics).  HOME CARE INSTRUCTIONS   Drink enough water and fluids to keep your urine clear or pale yellow.   Only take over-the-counter or prescription medicines as directed by your health care provider:   If you are prescribed antibiotics, make sure you finish them even if you start to feel better.   Do not take aspirin.   Get lots of rest.   Gargle with 8 oz of salt water ( tsp of salt per 1 qt of water) as often as every 1-2 hours to soothe your throat.   Throat lozenges (if you are not at risk for choking) or sprays may be used to soothe your throat. SEEK MEDICAL  CARE IF:   You have large, tender lumps in your neck.  You have a rash.  You cough up green, yellow-brown, or bloody spit. SEEK IMMEDIATE MEDICAL CARE IF:   Your neck becomes stiff.  You drool or are unable to swallow liquids.  You vomit or are unable to keep medicines or liquids down.  You have severe pain that does not go away with the use of recommended medicines.  You have trouble breathing (not caused by a stuffy nose). MAKE SURE YOU:   Understand these instructions.  Will watch your condition.  Will get help right away if you are not doing well or get worse.   This information is not intended to replace advice given to you by your health care provider. Make sure you discuss any questions you have with your health care provider.   Document Released: 11/21/2005 Document Revised: 09/11/2013 Document Reviewed: 07/29/2013 Elsevier Interactive Patient Education 2016 [employer].  Migraine Headache A migraine headache is an intense, throbbing pain on one or both sides of your head. A migraine can last for 30 minutes to several hours. CAUSES  The exact cause of a migraine headache is not always known. However, a migraine may be caused when nerves in the brain become irritated and release chemicals that cause inflammation. This causes pain. Certain things may also trigger migraines, such as:  Alcohol.  Smoking.  Stress.  Menstruation.  Aged cheeses.  Foods or drinks that contain nitrates, glutamate, aspartame, or tyramine.  Lack of sleep.  Chocolate.  Caffeine.  Hunger.  Physical exertion.  Fatigue.  Medicines used to treat chest pain (nitroglycerine), birth control pills, estrogen, and some blood pressure medicines. SIGNS AND SYMPTOMS  Pain on one or both sides of your head.  Pulsating or throbbing pain.  Severe pain that prevents daily activities.  Pain that is aggravated by any physical activity.  Nausea, vomiting, or  both.  Dizziness.  Pain with exposure to bright lights, loud noises, or activity.  General sensitivity to bright lights, loud noises, or smells. Before you get a migraine, you may get warning signs that a migraine is coming (aura). An aura may include:  Seeing flashing lights.  Seeing bright spots, halos, or zigzag lines.  Having tunnel vision or blurred vision.  Having feelings of numbness or tingling.  Having trouble talking.  Having muscle weakness. DIAGNOSIS  A migraine headache is often diagnosed based on:  Symptoms.  Physical exam.  A CT scan or MRI of your head. These imaging tests cannot diagnose migraines, but they can help rule out other causes of headaches. TREATMENT Medicines may be given for pain and nausea. Medicines can also be given to help prevent recurrent migraines.  HOME CARE INSTRUCTIONS  Only take over-the-counter or prescription medicines for pain or discomfort as directed by your health care provider. The use of long-term narcotics is not recommended.  Lie down in a dark, quiet room when you have a migraine.  Keep a journal to find out what may trigger your migraine headaches. For example, write down:  What you eat and drink.  How much sleep you get.  Any change to your diet or medicines.  Limit alcohol consumption.  Quit smoking if you smoke.  Get 7-9 hours of sleep, or as recommended by your health care provider.  Limit stress.  Keep lights dim if bright lights bother you and make your migraines worse. SEEK IMMEDIATE MEDICAL CARE IF:   Your migraine becomes severe.  You have a fever.  You have a stiff neck.  You have vision loss.  You have muscular weakness or loss of muscle control.  You start losing your balance or have trouble walking.  You feel faint or pass out.  You have severe symptoms that are different from your first symptoms. MAKE SURE YOU:   Understand these instructions.  Will watch your  condition.  Will get help right away if you are not doing well or get worse.   This information is not intended to replace advice given to you by your health care provider. Make sure you discuss any questions you have with your health care provider.   Document Released: 11/21/2005 Document Revised: 12/12/2014 Document Reviewed: 07/29/2013 Elsevier Interactive Patient Education 2016 Else[employer] Inc[employer]  Chronic Pain Chronic pain can be defined as pain that is off and on and lasts for 3-6 months or longer. Many things cause chronic pain, which can make it difficult to make a diagnosis. There are many treatment options available for chronic pain. However, finding a treatment that works well for you may require trying various approaches until the right one is found. Many people benefit from a combination of two or more types of treatment to control their pain. SYMPTOMS  Chronic pain can occur anywhere in the body and can range from mild to very severe. Some types of chronic pain include:  Headache.  Low back pain.  Cancer pain.  Arthritis pain.  Neurogenic pain. This is pain resulting from damage to nerves. People with chronic pain may also have other symptoms  such as:  Depression.  Anger.  Insomnia.  Anxiety. DIAGNOSIS  Your health care provider will help diagnose your condition over time. In many cases, the initial focus will be on excluding possible conditions that could be causing the pain. Depending on your symptoms, your health care provider may order tests to diagnose your condition. Some of these tests may include:   Blood tests.   CT scan.   MRI.   X-rays.   Ultrasounds.   Nerve conduction studies.  You may need to see a specialist.  TREATMENT  Finding treatment that works well may take time. You may be referred to a pain specialist. He or she may prescribe medicine or therapies, such as:   Mindful meditation or yoga.  Shots (injections) of numbing or  pain-relieving medicines into the spine or area of pain.  Local electrical stimulation.  Acupuncture.   Massage therapy.   Aroma, color, light, or sound therapy.   Biofeedback.   Working with a physical therapist to keep from getting stiff.   Regular, gentle exercise.   Cognitive or behavioral therapy.   Group support.  Sometimes, surgery may be recommended.  HOME CARE INSTRUCTIONS   Take all medicines as directed by your health care provider.   Lessen stress in your life by relaxing and doing things such as listening to calming music.   Exercise or be active as directed by your health care provider.   Eat a healthy diet and include things such as vegetables, fruits, fish, and lean meats in your diet.   Keep all follow-up appointments with your health care provider.   Attend a support group with others suffering from chronic pain. SEEK MEDICAL CARE IF:   Your pain gets worse.   You develop a new pain that was not there before.   You cannot tolerate medicines given to you by your health care provider.   You have new symptoms since your last visit with your health care provider.  SEEK IMMEDIATE MEDICAL CARE IF:   You feel weak.   You have decreased sensation or numbness.   You lose control of bowel or bladder function.   Your pain suddenly gets much worse.   You develop shaking.  You develop chills.  You develop confusion.  You develop chest pain.  You develop shortness of breath.  MAKE SURE YOU:  Understand these instructions.  Will watch your condition.  Will get help right away if you are not doing well or get worse.   This information is not intended to replace advice given to you by your health care provider. Make sure you discuss any questions you have with your health care provider.   Document Released: 08/13/2002 Document Revised: 07/24/2013 Document Reviewed: 05/17/2013 Elsevier Interactive Patient Education [employer]

## 2015-10-21 NOTE — ED Notes (Signed)
Spoke with main lab - they will add on hepatic function panel and lipase. 

## 2015-10-22 ENCOUNTER — Other Ambulatory Visit: Payer: Self-pay | Admitting: General Practice

## 2015-10-22 DIAGNOSIS — G8918 Other acute postprocedural pain: Secondary | ICD-10-CM

## 2015-10-22 NOTE — Telephone Encounter (Signed)
Patient called and left message stating she needs to get a Rx for pain medication. Patient states she is in pain and doesn't know why. Per chart review patient was sent home on 10/27 with #30 percocet. She was provided a refill on 11/9 for #10 percocet. Called patient and informed her that we are returning her call and that Dr Elly Modena is not in the office today but I have sent her message letting her know. Also stated that someone will call her back when we hear from Dr Elly Modena. Patient verbalized understanding and had no questions

## 2015-10-23 ENCOUNTER — Encounter: Payer: Self-pay | Admitting: *Deleted

## 2015-10-23 ENCOUNTER — Encounter (HOSPITAL_COMMUNITY): Payer: Self-pay

## 2015-10-23 ENCOUNTER — Inpatient Hospital Stay (HOSPITAL_COMMUNITY)
Admission: AD | Admit: 2015-10-23 | Discharge: 2015-10-23 | Disposition: A | Discharge status: Home / Self Care | Payer: Medicaid Other | Source: Ambulatory Visit | Attending: Family Medicine | Admitting: Family Medicine

## 2015-10-23 DIAGNOSIS — Z87891 Personal history of nicotine dependence: Secondary | ICD-10-CM | POA: Insufficient documentation

## 2015-10-23 DIAGNOSIS — G8918 Other acute postprocedural pain: Secondary | ICD-10-CM | POA: Diagnosis not present

## 2015-10-23 DIAGNOSIS — Z7982 Long term (current) use of aspirin: Secondary | ICD-10-CM | POA: Diagnosis not present

## 2015-10-23 DIAGNOSIS — R109 Unspecified abdominal pain: Secondary | ICD-10-CM | POA: Diagnosis present

## 2015-10-23 LAB — URINALYSIS, ROUTINE W REFLEX MICROSCOPIC
Bilirubin Urine: NEGATIVE
Glucose, UA: NEGATIVE mg/dL
Hgb urine dipstick: NEGATIVE
Ketones, ur: NEGATIVE mg/dL
Leukocytes, UA: NEGATIVE
Nitrite: NEGATIVE
Protein, ur: NEGATIVE mg/dL
Specific Gravity, Urine: 1.02 (ref 1.005–1.030)
pH: 6.5 (ref 5.0–8.0)

## 2015-10-23 LAB — CBC
HCT: 29.3 % — ABNORMAL LOW (ref 36.0–46.0)
Hemoglobin: 9.4 g/dL — ABNORMAL LOW (ref 12.0–15.0)
MCH: 27.6 pg (ref 26.0–34.0)
MCHC: 32.1 g/dL (ref 30.0–36.0)
MCV: 85.9 fL (ref 78.0–100.0)
Platelets: 164 10*3/uL (ref 150–400)
RBC: 3.41 MIL/uL — ABNORMAL LOW (ref 3.87–5.11)
RDW: 15.3 % (ref 11.5–15.5)
WBC: 5.9 10*3/uL (ref 4.0–10.5)

## 2015-10-23 LAB — COMPREHENSIVE METABOLIC PANEL
ALT: 10 U/L — ABNORMAL LOW (ref 14–54)
AST: 18 U/L (ref 15–41)
Albumin: 3.7 g/dL (ref 3.5–5.0)
Alkaline Phosphatase: 36 U/L — ABNORMAL LOW (ref 38–126)
Anion gap: 9 (ref 5–15)
BUN: 15 mg/dL (ref 6–20)
CO2: 26 mmol/L (ref 22–32)
Calcium: 8.8 mg/dL — ABNORMAL LOW (ref 8.9–10.3)
Chloride: 103 mmol/L (ref 101–111)
Creatinine, Ser: 0.61 mg/dL (ref 0.44–1.00)
GFR calc Af Amer: >60 mL/min (ref >60)
GFR calc non Af Amer: >60 mL/min (ref >60)
Glucose, Bld: 86 mg/dL (ref 65–99)
Potassium: 4.5 mmol/L (ref 3.5–5.1)
Sodium: 138 mmol/L (ref 135–145)
Total Bilirubin: 0.5 mg/dL (ref 0.3–1.2)
Total Protein: 7.7 g/dL (ref 6.5–8.1)

## 2015-10-23 LAB — CULTURE, GROUP A STREP: Strep A Culture: POSITIVE — AB

## 2015-10-23 MED ORDER — TRAMADOL HCL 50 MG PO TABS
100.0000 mg | ORAL_TABLET | Freq: Once | ORAL | Status: AC
  Administered 2015-10-23: 100 mg via ORAL
  Filled 2015-10-23: qty 2

## 2015-10-23 MED ORDER — OXYCODONE-ACETAMINOPHEN 5-325 MG PO TABS: 1.0000 | ORAL_TABLET | Freq: Four times a day (QID) | ORAL | Status: DC | PRN

## 2015-10-23 MED ORDER — KETOROLAC TROMETHAMINE 60 MG/2ML IM SOLN
60.0000 mg | Freq: Once | INTRAMUSCULAR | Status: DC
  Filled 2015-10-23: qty 2

## 2015-10-23 MED ORDER — IBUPROFEN 800 MG PO TABS: 800.0000 mg | ORAL_TABLET | Freq: Three times a day (TID) | ORAL | Status: DC | PRN

## 2015-10-23 NOTE — Telephone Encounter (Signed)
Per Dr Elly Modena, patient may have #20 percocet but this will be the last Rx. Patient needs to start taking ibuprofen. Called patient and informed her of Rx waiting to be picked up at our office and that this will be the last prescription. Discussed with patient that she should start prescription strength ibuprofen as this is the last Rx. Patient verbalized understanding to all and had no questions.

## 2015-10-23 NOTE — Discharge Instructions (Signed)
Abdominal Hysterectomy, Care After °Refer to this sheet in the next few weeks. These instructions provide you with information on caring for yourself after your procedure. Your health care provider may also give you more specific instructions. Your treatment has been planned according to current medical practices, but problems sometimes occur. Call your health care provider if you have any problems or questions after your procedure.  °WHAT TO EXPECT AFTER THE PROCEDURE °After your procedure, it is typical to have the following: °· Pain. °· Feeling tired. °· Poor appetite. °· Less interest in sex. °It takes 4-6 weeks to recover from this surgery.  °HOME CARE INSTRUCTIONS  °· Take pain medicines only as directed by your health care provider. Do not take over-the-counter pain medicines without checking with your health care provider first.  °· Change your bandage as directed by your health care provider. °· Return to your health care provider to have your sutures taken out. °· Take showers instead of baths for 2-3 weeks. Ask your health care provider when it is safe to start showering.  °· Do not douche, use tampons, or have sexual intercourse for at least 6 weeks or until your health care provider says you can.   °· Follow your health care provider's advice about exercise, lifting, driving, and general activities. °· Get plenty of rest and sleep.   °· Do not lift anything heavier than a gallon of milk (about 10 lb [4.5 kg]) for the first month after surgery. °· You can resume your normal diet if your health care provider says it is okay.   °· Do not drink alcohol until your health care provider says you can.   °· If you are constipated, ask your health care provider if you can take a mild laxative. °· Eating foods high in fiber may also help with constipation. Eat plenty of raw fruits and vegetables, whole grains, and beans. °· Drink enough fluids to keep your urine clear or pale yellow.   °· Try to have someone at  home with you for the first 1-2 weeks to help around the house. °· Keep all follow-up appointments. °SEEK MEDICAL CARE IF:  °· You have chills or fever. °· You have swelling, redness, or pain in the area of your incision that is getting worse.   °· You have pus coming from the incision.   °· You notice a bad smell coming from the incision or bandage.   °· Your incision breaks open.   °· You feel dizzy or light-headed.   °· You have pain or bleeding when you urinate.   °· You have persistent diarrhea.   °· You have persistent nausea and vomiting.   °· You have abnormal vaginal discharge.   °· You have a rash.   °· You have any type of abnormal reaction or develop an allergy to your medicine.   °· Your pain medicine is not helping.   °SEEK IMMEDIATE MEDICAL CARE IF:  °· You have a fever and your symptoms suddenly get worse. °· You have severe abdominal pain. °· You have chest pain. °· You have shortness of breath. °· You faint. °· You have pain, swelling, or redness of your leg. °· You have heavy vaginal bleeding with blood clots. °MAKE SURE YOU: °· Understand these instructions. °· Will watch your condition. °· Will get help right away if you are not doing well or get worse. °  °This information is not intended to replace advice given to you by your health care provider. Make sure you discuss any questions you have with your health care provider. °  °Document   Released: 06/10/2005 Document Revised: 12/12/2014 Document Reviewed: 09/13/2013 °Elsevier Interactive Patient Education ©2016 [employer] ° °

## 2015-10-23 NOTE — MAU Note (Signed)
To bedside to give IM pain med; pt refused the Toradol because "it burns too bad." Requesting something else for pain. Report given to Marcille Buffy CNM.

## 2015-10-23 NOTE — MAU Provider Note (Signed)
History     CSN: [id number]  Arrival date and time: 10/23/15 0201   First Provider Initiated Contact with Patient 10/23/15 0241      Chief Complaint  Patient presents with  . Abdominal Pain   HPI Comments: Brianna Velez is a 39 y.o. [id number] who presents today with pain after surgery. She has had continued pain, and has been taking percocet. She ran out last week, and had a refill given here in MAU. She is also out of that refill. Patient is not taking ibuprofen. She has also been taking tramadol from a friend. She denies any fever, or vaginal bleeding.   Abdominal Pain This is a new problem. The current episode started today (around 2000 ). The onset quality is gradual. The problem occurs constantly. The problem has been unchanged. The pain is located in the generalized abdominal region. The pain is at a severity of 10/10. The quality of the pain is sharp. The abdominal pain does not radiate. Associated symptoms include constipation (last BM 10/22/15). Pertinent negatives include no diarrhea, dysuria, fever, frequency, nausea or vomiting. Nothing aggravates the pain. The pain is relieved by nothing. Treatments tried: took tramadol from a friend. Helped some. Ran out of oxycodone.  The treatment provided mild relief.   Past Medical History  Diagnosis Date  . Migraine   . Leg pain   . Fibroids   . History of hiatal hernia     Past Surgical History  Procedure Laterality Date  . Tubal ligation    . Wisdom tooth extraction    . Bilateral salpingectomy Bilateral 09/29/2015    Procedure: BILATERAL SALPINGECTOMY;  Surgeon: Mora Bellman, MD;  Location: [facility];  Service: Gynecology;  Laterality: Bilateral;  . Abdominal hysterectomy  09/29/2015    Procedure: HYSTERECTOMY ABDOMINAL;  Surgeon: Mora Bellman, MD;  Location: [facility];  Service: Gynecology;;    Family History  Problem Relation Age of Onset  . Diabetes Sister   . Heart disease Paternal Grandmother   . Hypertension Father    . Dementia Mother   . Hypertension Mother     Social History  Substance Use Topics  . Smoking status: Former Research scientist (life sciences)  . Smokeless tobacco: Never Used  . Alcohol Use: No    Allergies: No Known Allergies  Prescriptions prior to admission  Medication Sig Dispense Refill Last Dose  . Aspirin-Salicylamide-Caffeine (BC HEADACHE POWDER PO) Take 1 Package by mouth daily as needed (pain).   10/19/2015 at Unknown time  . ibuprofen (ADVIL,MOTRIN) 600 MG tablet Take 1 tablet (600 mg total) by mouth every 6 (six) hours as needed. 30 tablet 0   . metoCLOPramide (REGLAN) 10 MG tablet Take 1 tablet (10 mg total) by mouth every 6 (six) hours as needed for nausea (nausea/headache). 6 tablet 0   . oxyCODONE-acetaminophen (PERCOCET/ROXICET) 5-325 MG tablet Take 1-2 tablets by mouth every 6 (six) hours as needed. (Patient taking differently: Take 1-2 tablets by mouth every 6 (six) hours as needed for moderate pain. ) 10 tablet 0 Past Week at Unknown time    Review of Systems  Constitutional: Negative for fever and chills.  Gastrointestinal: Positive for abdominal pain and constipation (last BM 10/22/15). Negative for nausea, vomiting and diarrhea.  Genitourinary: Negative for dysuria, urgency and frequency.   Physical Exam   Blood pressure 111/72, pulse 74, temperature 97.9 F (36.6 C), temperature source Oral, resp. rate 16, height 5\' 4"  (1.626 m), weight 41.277 kg (91 lb), last menstrual period 08/15/2015, SpO2 100 %.  Physical  Exam  Constitutional: She is oriented to person, place, and time. She appears well-developed and well-nourished. No distress.  HENT:  Head: Normocephalic.  Cardiovascular: Normal rate.   Respiratory: Effort normal.  GI: Soft. Bowel sounds are normal. She exhibits no distension and no mass. There is no tenderness. There is no rebound and no guarding.  Incision: C/D/I, well healed.   Neurological: She is alert and oriented to person, place, and time.  Skin: Skin is warm  and dry.  Psychiatric: She has a normal mood and affect.   Results for orders placed or performed during the hospital encounter of 10/23/15 (from the past 24 hour(s))  Urinalysis, Routine w reflex microscopic (not at [facility])     Status: None   Collection Time: 10/23/15  2:15 AM  Result Value Ref Range   Color, Urine YELLOW YELLOW   APPearance CLEAR CLEAR   Specific Gravity, Urine 1.020 1.005 - 1.030   pH 6.5 5.0 - 8.0   Glucose, UA NEGATIVE NEGATIVE mg/dL   Hgb urine dipstick NEGATIVE NEGATIVE   Bilirubin Urine NEGATIVE NEGATIVE   Ketones, ur NEGATIVE NEGATIVE mg/dL   Protein, ur NEGATIVE NEGATIVE mg/dL   Nitrite NEGATIVE NEGATIVE   Leukocytes, UA NEGATIVE NEGATIVE  CBC     Status: Abnormal   Collection Time: 10/23/15  2:55 AM  Result Value Ref Range   WBC 5.9 4.0 - 10.5 K/uL   RBC 3.41 (L) 3.87 - 5.11 MIL/uL   Hemoglobin 9.4 (L) 12.0 - 15.0 g/dL   HCT 29.3 (L) 36.0 - 46.0 %   MCV 85.9 78.0 - 100.0 fL   MCH 27.6 26.0 - 34.0 pg   MCHC 32.1 30.0 - 36.0 g/dL   RDW 15.3 11.5 - 15.5 %   Platelets 164 150 - 400 K/uL  Comprehensive metabolic panel     Status: Abnormal   Collection Time: 10/23/15  2:55 AM  Result Value Ref Range   Sodium 138 135 - 145 mmol/L   Potassium 4.5 3.5 - 5.1 mmol/L   Chloride 103 101 - 111 mmol/L   CO2 26 22 - 32 mmol/L   Glucose, Bld 86 65 - 99 mg/dL   BUN 15 6 - 20 mg/dL   Creatinine, Ser 0.61 0.44 - 1.00 mg/dL   Calcium 8.8 (L) 8.9 - 10.3 mg/dL   Total Protein 7.7 6.5 - 8.1 g/dL   Albumin 3.7 3.5 - 5.0 g/dL   AST 18 15 - 41 U/L   ALT 10 (L) 14 - 54 U/L   Alkaline Phosphatase 36 (L) 38 - 126 U/L   Total Bilirubin 0.5 0.3 - 1.2 mg/dL   GFR calc non Af Amer >60 >60 mL/min   GFR calc Af Amer >60 >60 mL/min   Anion gap 9 5 - 15     MAU Course  Procedures  MDM Patient refusing toradol for her pain. She states that the tramadol her friend gave her worked really well. She would prefer that.  Patient has had tramadol. Pain is better  Assessment  and Plan   1. Post-op pain    DC home Comfort measures reviewed  RX: ibuprofen 800 mg PRN #30  Return to MAU as needed   Follow-up Information    Follow up with [facility].   Specialty:  Obstetrics and Gynecology   Contact information:   [place], [place] 10/23/2015, 2:42 AM

## 2015-10-23 NOTE — MAU Note (Signed)
Pt here with c/o pain in abdomen from surgery on 09/29/15, had large fibroid removed at that time. Denies any bleeding.

## 2015-10-24 ENCOUNTER — Telehealth (HOSPITAL_COMMUNITY): Payer: Self-pay

## 2015-10-24 LAB — URINE CULTURE

## 2015-10-24 NOTE — Telephone Encounter (Signed)
Post ED Visit - Positive Culture Follow-up: Chart Hand-off to ED Flow Manager  Culture assessed and recommendations reviewed by: []  Levester Fresh, Pharm.D., BCPS []  Heide Guile, Pharm.D., BCPS-AQ ID []  Alycia Rossetti, Pharm.D., BCPS []  Grant, Pharm.D., BCPS, AAHIVP []  Legrand Como, Pharm .D., BCPS, AAHIVP [x]  Milus Glazier, Pharm.D. []  Dimitri Ped, Pharm.D.  Positive strep culture  [x]  Patient discharged without antimicrobial prescription and treatment is now indicated []  Organism is resistant to prescribed ED discharge antimicrobial []  Patient with positive blood cultures  Changes discussed with ED provider: Monico Blitz New antibiotic prescription Amoxicillin 500mg  po bid x 7 days. No refills  Pt notified and prescription called to [facility] on [address] . Left on MD voicemail.    Ileene Musa 10/24/2015, 9:53 AM

## 2015-10-24 NOTE — Progress Notes (Signed)
ED Antimicrobial Stewardship Positive Culture Follow Up   Brianna Velez is an 39 y.o. female who presented to [facility] on 10/23/2015 with a chief complaint of  Chief Complaint  Patient presents with  . Abdominal Pain    Recent Results (from the past 720 hour(s))  Wet prep, genital     Status: Abnormal   Collection Time: 10/14/15  6:30 PM  Result Value Ref Range Status   Yeast Wet Prep HPF POC MODERATE (A) NONE SEEN Final   Trich, Wet Prep NONE SEEN NONE SEEN Final   Clue Cells Wet Prep HPF POC FEW (A) NONE SEEN Final   WBC, Wet Prep HPF POC MODERATE (A) NONE SEEN Final    Comment: BACTERIA- TOO NUMEROUS TO COUNT  Rapid strep screen (not at [facility])     Status: None   Collection Time: 10/20/15 11:47 PM  Result Value Ref Range Status   Streptococcus, Group A Screen (Direct) NEGATIVE NEGATIVE Final    Comment: (NOTE) A Rapid Antigen test may result negative if the antigen level in the sample is below the detection level of this test. The FDA has not cleared this test as a stand-alone test therefore the rapid antigen negative result has reflexed to a Group A Strep culture.   Culture, Group A Strep     Status: Abnormal   Collection Time: 10/20/15 11:47 PM  Result Value Ref Range Status   Strep A Culture Positive (A)  Corrected    Comment: (NOTE) Penicillin and ampicillin are drugs of choice for treatment of beta-hemolytic streptococcal infections. Susceptibility testing of penicillins and other beta-lactam agents approved by the FDA for treatment of beta-hemolytic streptococcal infections need not be performed routinely because nonsusceptible isolates are extremely rare in any beta-hemolytic streptococcus and have not been reported for Streptococcus pyogenes (group A). (CLSI 2011) Performed At: [facility] [address] [place], [place] [id number] Lindon Romp MD [id number] CORRECTED ON 11/18 AT 1638: PREVIOUSLY REPORTED AS Comment     [x]  Patient discharged  originally without antimicrobial agent and treatment is now indicated  New antibiotic prescription: Amoxicillin 500 mg PO BID x 7 days  ED Provider: Monico Blitz, PA-C  Rolen Conger L. Nicole Kindred, PharmD PGY2 Infectious Diseases Pharmacy Resident Pager: [phone] 10/24/2015 8:00 AM

## 2015-11-04 ENCOUNTER — Ambulatory Visit (INDEPENDENT_AMBULATORY_CARE_PROVIDER_SITE_OTHER): Payer: Self-pay | Admitting: Obstetrics and Gynecology

## 2015-11-04 ENCOUNTER — Encounter: Payer: Self-pay | Admitting: Obstetrics and Gynecology

## 2015-11-04 VITALS — BP 106/76 | HR 104 | Temp 98.6°F | Ht 62.0 in | Wt 88.2 lb

## 2015-11-04 DIAGNOSIS — Z9889 Other specified postprocedural states: Secondary | ICD-10-CM

## 2015-11-04 NOTE — Progress Notes (Signed)
   Subjective:    Patient ID: Brianna Velez, female    DOB: 10-May-1976, 39 y.o.   MRN: [id number]  HPI 39 y o G2P2 here for post op check. Patient underwent a TAH with bilateral salpingectomy on 09/29/2015. Patient reports feeling well today. She denies any pain today. Her post op course has been complicated by several MAU visits for post op pain. Today she describes some soreness over the incision. She is without any other complaints. She denies fever, chills, or abnormal drainage from her incision  Past Medical History  Diagnosis Date  . Migraine   . Leg pain   . Fibroids   . History of hiatal hernia    Past Surgical History  Procedure Laterality Date  . Tubal ligation    . Wisdom tooth extraction    . Bilateral salpingectomy Bilateral 09/29/2015    Procedure: BILATERAL SALPINGECTOMY;  Surgeon: Mora Bellman, MD;  Location: [facility];  Service: Gynecology;  Laterality: Bilateral;  . Abdominal hysterectomy  09/29/2015    Procedure: HYSTERECTOMY ABDOMINAL;  Surgeon: Mora Bellman, MD;  Location: [facility];  Service: Gynecology;;   Family History  Problem Relation Age of Onset  . Diabetes Sister   . Heart disease Paternal Grandmother   . Hypertension Father   . Dementia Mother   . Hypertension Mother    Social History  Substance Use Topics  . Smoking status: Former Research scientist (life sciences)  . Smokeless tobacco: Never Used  . Alcohol Use: No      Review of Systems See pertinent in HPI    Objective:   Physical Exam GENERAL: Well-developed, well-nourished female in no acute distress.  ABDOMEN: Soft, nontender, nondistended. No organomegaly. Incision: healed completely. No erythema, induration or drainage PELVIC: Normal external female genitalia. Vagina is pink and rugated. Intact vaginal vault. No adnexal mass or tenderness. EXTREMITIES: No cyanosis, clubbing, or edema, 2+ distal pulses.     Assessment & Plan:  39 yo here for post op check s/p TAH with bilateral salpingectomy on 09/29/2015 -  Patient is medically cleared to resume all activities of daily living - Pathology results reviewed - Reminded the patient of a need for annual physical exam with pelvic exam but no longer needs pap smears - RTC prn

## 2015-11-12 ENCOUNTER — Ambulatory Visit: Payer: Medicaid Other | Admitting: Internal Medicine

## 2015-11-18 ENCOUNTER — Inpatient Hospital Stay (HOSPITAL_COMMUNITY)
Admission: AD | Admit: 2015-11-18 | Discharge: 2015-11-19 | Disposition: A | Discharge status: Home / Self Care | Payer: Medicaid Other | Attending: Obstetrics and Gynecology | Admitting: Obstetrics and Gynecology

## 2015-11-18 ENCOUNTER — Encounter (HOSPITAL_COMMUNITY): Payer: Self-pay | Admitting: *Deleted

## 2015-11-18 DIAGNOSIS — Z87891 Personal history of nicotine dependence: Secondary | ICD-10-CM | POA: Insufficient documentation

## 2015-11-18 DIAGNOSIS — Z7982 Long term (current) use of aspirin: Secondary | ICD-10-CM | POA: Insufficient documentation

## 2015-11-18 DIAGNOSIS — M5432 Sciatica, left side: Secondary | ICD-10-CM | POA: Insufficient documentation

## 2015-11-18 DIAGNOSIS — M79605 Pain in left leg: Secondary | ICD-10-CM | POA: Diagnosis present

## 2015-11-18 MED ORDER — TRAMADOL HCL 50 MG PO TABS
100.0000 mg | ORAL_TABLET | Freq: Once | ORAL | Status: AC
  Administered 2015-11-18: 100 mg via ORAL
  Filled 2015-11-18: qty 2

## 2015-11-18 NOTE — MAU Provider Note (Signed)
History     CSN: [id number]  Arrival date and time: 11/18/15 2208   First Provider Initiated Contact with Patient 11/18/15 2314      Chief Complaint  Patient presents with  . Leg Pain   HPI Comments: Brianna Velez is a 39 y.o. [id number] who presents today with left leg pain. She states that she has had this pain before, and it was due to a pinched nerve in her leg.   Leg Pain  The incident occurred 1 to 3 hours ago. The incident occurred at home. There was no injury mechanism. The pain is present in the left hip and left thigh. The quality of the pain is described as aching. The pain is at a severity of 10/10. The pain has been fluctuating since onset. Associated symptoms include numbness. Pertinent negatives include no inability to bear weight, loss of motion or tingling. Nothing aggravates the symptoms. She has tried NSAIDs for the symptoms. The treatment provided no relief.    Past Medical History  Diagnosis Date  . Migraine   . Leg pain   . Fibroids   . History of hiatal hernia     Past Surgical History  Procedure Laterality Date  . Tubal ligation    . Wisdom tooth extraction    . Bilateral salpingectomy Bilateral 09/29/2015    Procedure: BILATERAL SALPINGECTOMY;  Surgeon: Mora Bellman, MD;  Location: [facility];  Service: Gynecology;  Laterality: Bilateral;  . Abdominal hysterectomy  09/29/2015    Procedure: HYSTERECTOMY ABDOMINAL;  Surgeon: Mora Bellman, MD;  Location: [facility];  Service: Gynecology;;    Family History  Problem Relation Age of Onset  . Diabetes Sister   . Heart disease Paternal Grandmother   . Hypertension Father   . Dementia Mother   . Hypertension Mother     Social History  Substance Use Topics  . Smoking status: Former Research scientist (life sciences)  . Smokeless tobacco: Never Used  . Alcohol Use: No    Allergies: No Known Allergies  Prescriptions prior to admission  Medication Sig Dispense Refill Last Dose  . Aspirin-Salicylamide-Caffeine (BC HEADACHE POWDER  PO) Take 1 Package by mouth daily as needed (pain).   Taking  . ibuprofen (ADVIL,MOTRIN) 600 MG tablet Take 1 tablet (600 mg total) by mouth every 6 (six) hours as needed. (Patient not taking: Reported on 11/04/2015) 30 tablet 0 Not Taking  . ibuprofen (ADVIL,MOTRIN) 800 MG tablet Take 1 tablet (800 mg total) by mouth every 8 (eight) hours as needed. (Patient not taking: Reported on 11/04/2015) 30 tablet 0 Not Taking  . metoCLOPramide (REGLAN) 10 MG tablet Take 1 tablet (10 mg total) by mouth every 6 (six) hours as needed for nausea (nausea/headache). (Patient not taking: Reported on 11/04/2015) 6 tablet 0 Not Taking  . oxyCODONE-acetaminophen (PERCOCET/ROXICET) 5-325 MG tablet Take 1-2 tablets by mouth every 6 (six) hours as needed. (Patient not taking: Reported on 11/04/2015) 20 tablet 0 Not Taking    Review of Systems  Constitutional: Negative for fever and chills.  Eyes: Negative for blurred vision.  Musculoskeletal: Negative for falls.  Neurological: Positive for numbness. Negative for tingling and sensory change.   Physical Exam   Blood pressure 100/74, pulse 86, temperature 97.7 F (36.5 C), temperature source Oral, resp. rate 16, height 5\' 2"  (1.575 m), weight 41.549 kg (91 lb 9.6 oz), last menstrual period 08/15/2015, SpO2 100 %.  Physical Exam  Nursing note and vitals reviewed. Constitutional: She is oriented to person, place, and time. She appears well-developed and  well-nourished. No distress.  HENT:  Head: Normocephalic.  Cardiovascular: Normal rate.   Respiratory: Effort normal.  GI: Soft. There is no tenderness. There is no rebound.  Neurological: She is alert and oriented to person, place, and time.  Skin: Skin is warm and dry.  Psychiatric: She has a normal mood and affect.     MAU Course  Procedures  MDM Patient given one dose of tramadol here. Advised that if symptoms returned then further care would need to be at [facility] or [facility]. Patient verbalizes understanding.    Assessment and Plan   1. Sciatica of left side    DC home Comfort measures reviewed  RX: none  Return to MAU as needed FU with OB as planned  Follow-up Information    Follow up with [facility].   Why:  If symptoms worsen   Contact information:   [place] [id number]        Mathis Bud 11/18/2015, 11:16 PM

## 2015-11-18 NOTE — MAU Note (Signed)
Pt states that she has a pinched nerve in left leg. States that it started hurting about 30 mins ago. States she took 800mg  ibuprofen-has not started working yet. States the last time this happened they gave her tramadol-states she has not had tramadol in 2-3 months. States husband drove her here. Pt states she had hysterectomy on 09/29/2015 here. No GYN complaints.

## 2015-11-18 NOTE — Discharge Instructions (Signed)

## 2015-11-22 ENCOUNTER — Emergency Department (HOSPITAL_COMMUNITY)
Admission: EM | Admit: 2015-11-22 | Discharge: 2015-11-22 | Disposition: A | Discharge status: Home / Self Care | Payer: Medicaid Other | Attending: Emergency Medicine | Admitting: Emergency Medicine

## 2015-11-22 ENCOUNTER — Emergency Department (HOSPITAL_COMMUNITY): Payer: Medicaid Other

## 2015-11-22 ENCOUNTER — Encounter (HOSPITAL_COMMUNITY): Payer: Self-pay | Admitting: *Deleted

## 2015-11-22 DIAGNOSIS — Z86018 Personal history of other benign neoplasm: Secondary | ICD-10-CM | POA: Diagnosis not present

## 2015-11-22 DIAGNOSIS — M25552 Pain in left hip: Secondary | ICD-10-CM | POA: Insufficient documentation

## 2015-11-22 DIAGNOSIS — M5442 Lumbago with sciatica, left side: Secondary | ICD-10-CM

## 2015-11-22 DIAGNOSIS — Z87891 Personal history of nicotine dependence: Secondary | ICD-10-CM | POA: Insufficient documentation

## 2015-11-22 DIAGNOSIS — Z8679 Personal history of other diseases of the circulatory system: Secondary | ICD-10-CM | POA: Diagnosis not present

## 2015-11-22 DIAGNOSIS — G8929 Other chronic pain: Secondary | ICD-10-CM

## 2015-11-22 MED ORDER — TRAMADOL HCL 50 MG PO TABS: 50.0000 mg | ORAL_TABLET | Freq: Four times a day (QID) | ORAL | Status: DC | PRN

## 2015-11-22 MED ORDER — KETOROLAC TROMETHAMINE 60 MG/2ML IM SOLN
60.0000 mg | Freq: Once | INTRAMUSCULAR | Status: DC
  Filled 2015-11-22: qty 2

## 2015-11-22 MED ORDER — DEXAMETHASONE SODIUM PHOSPHATE 10 MG/ML IJ SOLN
10.0000 mg | Freq: Once | INTRAMUSCULAR | Status: DC
  Filled 2015-11-22: qty 1

## 2015-11-22 MED ORDER — TRAMADOL HCL 50 MG PO TABS
50.0000 mg | ORAL_TABLET | Freq: Once | ORAL | Status: AC
  Administered 2015-11-22: 50 mg via ORAL
  Filled 2015-11-22: qty 1

## 2015-11-22 NOTE — ED Notes (Signed)
Patient transported to X-ray 

## 2015-11-22 NOTE — ED Notes (Signed)
Patient presents with c/o pain going down the back of her left leg.  Denies recent injury

## 2015-11-22 NOTE — ED Notes (Signed)
Patient off the floor for scans.  
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
## 2015-11-22 NOTE — ED Notes (Signed)
Pt returns radiology

## 2015-11-22 NOTE — Discharge Instructions (Signed)
Chronic Back Pain ° When back pain lasts longer than 3 months, it is called chronic back pain. People with chronic back pain often go through certain periods that are more intense (flare-ups).  °CAUSES °Chronic back pain can be caused by wear and tear (degeneration) on different structures in your back. These structures include: °· The bones of your spine (vertebrae) and the joints surrounding your spinal cord and nerve roots (facets). °· The strong, fibrous tissues that connect your vertebrae (ligaments). °Degeneration of these structures may result in pressure on your nerves. This can lead to constant pain. °HOME CARE INSTRUCTIONS °· Avoid bending, heavy lifting, prolonged sitting, and activities which make the problem worse. °· Take brief periods of rest throughout the day to reduce your pain. Lying down or standing usually is better than sitting while you are resting. °· Take over-the-counter or prescription medicines only as directed by your caregiver. °SEEK IMMEDIATE MEDICAL CARE IF:  °· You have weakness or numbness in one of your legs or feet. °· You have trouble controlling your bladder or bowels. °· You have nausea, vomiting, abdominal pain, shortness of breath, or fainting. °  °This information is not intended to replace advice given to you by your health care provider. Make sure you discuss any questions you have with your health care provider. °  °Document Released: 12/29/2004 Document Revised: 02/13/2012 Document Reviewed: 05/11/2015 °Elsevier Interactive Patient Education ©2016 Elsevier Inc. ° °Sciatica °Sciatica is pain, weakness, numbness, or tingling along the path of the sciatic nerve. The nerve starts in the lower back and runs down the back of each leg. The nerve controls the muscles in the lower leg and in the back of the knee, while also providing sensation to the back of the thigh, lower leg, and the sole of your foot. Sciatica is a symptom of another medical condition. For instance, nerve  damage or certain conditions, such as a herniated disk or bone spur on the spine, pinch or put pressure on the sciatic nerve. This causes the pain, weakness, or other sensations normally associated with sciatica. Generally, sciatica only affects one side of the body. °CAUSES  °· Herniated or slipped disc. °· Degenerative disk disease. °· A pain disorder involving the narrow muscle in the buttocks (piriformis syndrome). °· Pelvic injury or fracture. °· Pregnancy. °· Tumor (rare). °SYMPTOMS  °Symptoms can vary from mild to very severe. The symptoms usually travel from the low back to the buttocks and down the back of the leg. Symptoms can include: °· Mild tingling or dull aches in the lower back, leg, or hip. °· Numbness in the back of the calf or sole of the foot. °· Burning sensations in the lower back, leg, or hip. °· Sharp pains in the lower back, leg, or hip. °· Leg weakness. °· Severe back pain inhibiting movement. °These symptoms may get worse with coughing, sneezing, laughing, or prolonged sitting or standing. Also, being overweight may worsen symptoms. °DIAGNOSIS  °Your caregiver will perform a physical exam to look for common symptoms of sciatica. He or she may ask you to do certain movements or activities that would trigger sciatic nerve pain. Other tests may be performed to find the cause of the sciatica. These may include: °· Blood tests. °· X-rays. °· Imaging tests, such as an MRI or CT scan. °TREATMENT  °Treatment is directed at the cause of the sciatic pain. Sometimes, treatment is not necessary and the pain and discomfort goes away on its own. If treatment is needed, your   caregiver may suggest: °· Over-the-counter medicines to relieve pain. °· Prescription medicines, such as anti-inflammatory medicine, muscle relaxants, or narcotics. °· Applying heat or ice to the painful area. °· Steroid injections to lessen pain, irritation, and inflammation around the nerve. °· Reducing activity during periods of  pain. °· Exercising and stretching to strengthen your abdomen and improve flexibility of your spine. Your caregiver may suggest losing weight if the extra weight makes the back pain worse. °· Physical therapy. °· Surgery to eliminate what is pressing or pinching the nerve, such as a bone spur or part of a herniated disk. °HOME CARE INSTRUCTIONS  °· Only take over-the-counter or prescription medicines for pain or discomfort as directed by your caregiver. °· Apply ice to the affected area for 20 minutes, 3-4 times a day for the first 48-72 hours. Then try heat in the same way. °· Exercise, stretch, or perform your usual activities if these do not aggravate your pain. °· Attend physical therapy sessions as directed by your caregiver. °· Keep all follow-up appointments as directed by your caregiver. °· Do not wear high heels or shoes that do not provide proper support. °· Check your mattress to see if it is too soft. A firm mattress may lessen your pain and discomfort. °SEEK IMMEDIATE MEDICAL CARE IF:  °· You lose control of your bowel or bladder (incontinence). °· You have increasing weakness in the lower back, pelvis, buttocks, or legs. °· You have redness or swelling of your back. °· You have a burning sensation when you urinate. °· You have pain that gets worse when you lie down or awakens you at night. °· Your pain is worse than you have experienced in the past. °· Your pain is lasting longer than 4 weeks. °· You are suddenly losing weight without reason. °MAKE SURE YOU: °· Understand these instructions. °· Will watch your condition. °· Will get help right away if you are not doing well or get worse. °  °This information is not intended to replace advice given to you by your health care provider. Make sure you discuss any questions you have with your health care provider. °  °Document Released: 11/15/2001 Document Revised: 08/12/2015 Document Reviewed: 04/01/2012 °Elsevier Interactive Patient Education ©2016  Elsevier Inc. ° °

## 2015-11-28 ENCOUNTER — Emergency Department (HOSPITAL_COMMUNITY)
Admission: EM | Admit: 2015-11-28 | Discharge: 2015-11-28 | Disposition: A | Discharge status: Home / Self Care | Payer: Medicaid Other | Attending: Emergency Medicine | Admitting: Emergency Medicine

## 2015-11-28 ENCOUNTER — Encounter (HOSPITAL_COMMUNITY): Payer: Self-pay

## 2015-11-28 DIAGNOSIS — Z87891 Personal history of nicotine dependence: Secondary | ICD-10-CM | POA: Diagnosis not present

## 2015-11-28 DIAGNOSIS — Z8719 Personal history of other diseases of the digestive system: Secondary | ICD-10-CM | POA: Diagnosis not present

## 2015-11-28 DIAGNOSIS — Z760 Encounter for issue of repeat prescription: Secondary | ICD-10-CM | POA: Diagnosis present

## 2015-11-28 DIAGNOSIS — M549 Dorsalgia, unspecified: Secondary | ICD-10-CM

## 2015-11-28 DIAGNOSIS — M545 Low back pain: Secondary | ICD-10-CM | POA: Insufficient documentation

## 2015-11-28 DIAGNOSIS — Z862 Personal history of diseases of the blood and blood-forming organs and certain disorders involving the immune mechanism: Secondary | ICD-10-CM | POA: Insufficient documentation

## 2015-11-28 DIAGNOSIS — G43909 Migraine, unspecified, not intractable, without status migrainosus: Secondary | ICD-10-CM | POA: Insufficient documentation

## 2015-11-28 DIAGNOSIS — G8929 Other chronic pain: Secondary | ICD-10-CM | POA: Insufficient documentation

## 2015-11-28 MED ORDER — TRAMADOL HCL 50 MG PO TABS
100.0000 mg | ORAL_TABLET | Freq: Once | ORAL | Status: AC
  Administered 2015-11-28: 100 mg via ORAL
  Filled 2015-11-28: qty 2

## 2015-11-28 NOTE — ED Provider Notes (Signed)
CSN: [id number]     Arrival date & time 11/28/15  2119 History  By signing my name below, I, Brianna Velez, attest that this documentation has been prepared under the direction and in the presence of Brianna Coone, PA-C. Electronically Signed: Meriel Velez, ED Scribe. 11/28/2015. 11:08 PM.   Chief Complaint  Patient presents with  . Leg Pain  . Medication Refill   The history is provided by the patient. No language interpreter was used.   HPI Comments: Brianna Velez is a 39 y.o. female, with a PMhx of chronic lower back pain that radiates to left leg, who presents to the Emergency Department for a medication refill of tramadol. 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 Denies any recent trauma or injury. She was seen in the ED 6 days ago and prescribed a short course of tramadol 50mg . Pt has since run out of this medication and is requesting a refill tonight in the ED, as she is unable to see her PCP until the new year. She is not followed by pain management. Denies fevers, chills, any urinary symptoms, bowel or bladder incontinence, numbness or weakness, difficulty ambulating, or any overlying skin changes.   Past Medical History  Diagnosis Date  . Migraine   . Leg pain   . Fibroids   . History of hiatal hernia    Past Surgical History  Procedure Laterality Date  . Tubal ligation    . Wisdom tooth extraction    . Bilateral salpingectomy Bilateral 09/29/2015    Procedure: BILATERAL SALPINGECTOMY;  Surgeon: Brianna Bellman, MD;  Location: [facility];  Service: Gynecology;  Laterality: Bilateral;  . Abdominal hysterectomy  09/29/2015    Procedure: HYSTERECTOMY ABDOMINAL;  Surgeon: Brianna Bellman, MD;  Location: [facility];  Service: Gynecology;;   Family History  Problem Relation Age of Onset  . Diabetes Sister   . Heart disease Paternal Grandmother   . Hypertension Father   . Dementia Mother   . Hypertension Mother     Social History  Substance Use Topics  . Smoking status: Former Research scientist (life sciences)  . Smokeless tobacco: Never Used  . Alcohol Use: No   OB History    Gravida Para Term Preterm AB TAB SAB Ectopic Multiple Living   2 2 2  0 0 0 0 0 0 2     Review of Systems  Constitutional: Negative for fever and chills.  Genitourinary: Negative for difficulty urinating.  Musculoskeletal: Positive for back pain ( left paraspinal lumbar-sacroiliac joint) and arthralgias ( left leg pain). Negative for gait problem.  Skin: Negative for color change and wound.  Neurological: Negative for weakness and numbness.   Allergies  Review of patient's allergies indicates no known allergies.  Home Medications   Prior to Admission medications   Medication Sig Start Date End Date Taking? Authorizing Provider  Aspirin-Salicylamide-Caffeine (BC HEADACHE POWDER PO) Take 1 Package by mouth daily as needed (pain).    Historical Provider, MD  ibuprofen (ADVIL,MOTRIN) 200 MG tablet Take 400 mg by mouth every 6 (six) hours as needed for moderate pain.    Historical Provider, MD  ibuprofen (ADVIL,MOTRIN) 600 MG tablet Take 1 tablet (600 mg total) by mouth every 6 (six) hours as needed. Patient not taking: Reported on 11/04/2015 10/21/15   [name], MD  ibuprofen (ADVIL,MOTRIN) 800 MG tablet Take 1 tablet (800 mg total) by mouth every 8 (eight) hours as needed. Patient not taking: Reported on 11/04/2015  10/23/15   [name], CNM  metoCLOPramide (REGLAN) 10 MG tablet Take 1 tablet (10 mg total) by mouth every 6 (six) hours as needed for nausea (nausea/headache). Patient not taking: Reported on 11/04/2015 10/21/15   [name], MD  oxyCODONE-acetaminophen (PERCOCET/ROXICET) 5-325 MG tablet Take 1-2 tablets by mouth every 6 (six) hours as needed. Patient not taking: Reported on 11/04/2015 10/23/15   Brianna Bellman, MD  traMADol (ULTRAM) 50 MG tablet Take 1 tablet (50 mg total) by mouth every 6 (six) hours as needed.  11/22/15   [name], PA-C   BP 111/70 mmHg  Pulse 98  Temp(Src) 97.6 F (36.4 C) (Oral)  Resp 18  SpO2 99%  LMP 08/15/2015 (Approximate) Physical Exam  Constitutional: She is oriented to person, place, and time. She appears well-developed and well-nourished. No distress.  HENT:  Head: Normocephalic.  Eyes: Conjunctivae are normal.  Neck: Normal range of motion. Neck supple.  Cardiovascular: Normal rate.   Pulmonary/Chest: Effort normal. No respiratory distress.  Musculoskeletal: Normal range of motion.  No midline lumbar spine tenderness. Tender to palpation over left paraspinal muscles of the lumbar spine, left SI joint, left buttock. Pain with left straight leg raise.  Neurological: She is alert and oriented to person, place, and time. Coordination normal.  5/5 and equal lower extremity strength. 2+ and equal patellar reflexes bilaterally. Pt able to dorsiflex bilateral toes and feet with good strength against resistance. Equal sensation bilaterally over thighs and lower legs.   Skin: Skin is warm.  Psychiatric: She has a normal mood and affect. Her behavior is normal.  Nursing note and vitals reviewed.   ED Course  Procedures  DIAGNOSTIC STUDIES: Oxygen Saturation is 99% on RA, normal by my interpretation.    COORDINATION OF CARE: 11:00 PM Discussed treatment plan which includes to order 100mg  tramadol with pt. Advised pt to follow up with her PCP for management of chronic pain. Pt acknowledges and agrees to plan.   MDM   Final diagnoses:  Chronic back pain    Patient with back pain.  No neurological deficits and normal neuro exam.  Patient is ambulatory.  No loss of bowel or bladder control.  No concern for cauda equina.  No fever, night sweats, weight loss, h/o cancer, IVDA, no recent procedure to back. No urinary symptoms suggestive of UTI.  Supportive care and return precaution discussed. Appears safe for discharge at this time. Follow up as indicated in discharge  paperwork.   Pt here for refill of controlled medication. Will order tramadol for pt in ED and discussed that chronic pain management medication should not be refilled in the Emergency Department. Discussed need to follow up with PCP in 2-3 days. Pt is safe for discharge at this time.  Filed Vitals:   11/28/15 2126 11/28/15 2337  BP: 111/70 112/68  Pulse: 98 95  Temp: 97.6 F (36.4 C)   TempSrc: Oral   Resp: 18 16  SpO2: 99% 100%    I personally performed the services described in this documentation, which was scribed in my presence. The recorded information has been reviewed and is accurate.    Jeannett Senior, PA-C 11/29/15 0101  Milton Ferguson, MD 11/30/15 [phone]

## 2015-11-28 NOTE — Discharge Instructions (Signed)
Please call your doctor Monday for refill of your pain medication.   Chronic Pain Chronic pain can be defined as pain that is off and on and lasts for 3-6 months or longer. Many things cause chronic pain, which can make it difficult to make a diagnosis. There are many treatment options available for chronic pain. However, finding a treatment that works well for you may require trying various approaches until the right one is found. Many people benefit from a combination of two or more types of treatment to control their pain. SYMPTOMS  Chronic pain can occur anywhere in the body and can range from mild to very severe. Some types of chronic pain include:  Headache.  Low back pain.  Cancer pain.  Arthritis pain.  Neurogenic pain. This is pain resulting from damage to nerves. People with chronic pain may also have other symptoms such as:  Depression.  Anger.  Insomnia.  Anxiety. DIAGNOSIS  Your health care provider will help diagnose your condition over time. In many cases, the initial focus will be on excluding possible conditions that could be causing the pain. Depending on your symptoms, your health care provider may order tests to diagnose your condition. Some of these tests may include:   Blood tests.   CT scan.   MRI.   X-rays.   Ultrasounds.   Nerve conduction studies.  You may need to see a specialist.  TREATMENT  Finding treatment that works well may take time. You may be referred to a pain specialist. He or she may prescribe medicine or therapies, such as:   Mindful meditation or yoga.  Shots (injections) of numbing or pain-relieving medicines into the spine or area of pain.  Local electrical stimulation.  Acupuncture.   Massage therapy.   Aroma, color, light, or sound therapy.   Biofeedback.   Working with a physical therapist to keep from getting stiff.   Regular, gentle exercise.   Cognitive or behavioral therapy.   Group support.   Sometimes, surgery may be recommended.  HOME CARE INSTRUCTIONS   Take all medicines as directed by your health care provider.   Lessen stress in your life by relaxing and doing things such as listening to calming music.   Exercise or be active as directed by your health care provider.   Eat a healthy diet and include things such as vegetables, fruits, fish, and lean meats in your diet.   Keep all follow-up appointments with your health care provider.   Attend a support group with others suffering from chronic pain. SEEK MEDICAL CARE IF:   Your pain gets worse.   You develop a new pain that was not there before.   You cannot tolerate medicines given to you by your health care provider.   You have new symptoms since your last visit with your health care provider.  SEEK IMMEDIATE MEDICAL CARE IF:   You feel weak.   You have decreased sensation or numbness.   You lose control of bowel or bladder function.   Your pain suddenly gets much worse.   You develop shaking.  You develop chills.  You develop confusion.  You develop chest pain.  You develop shortness of breath.  MAKE SURE YOU:  Understand these instructions.  Will watch your condition.  Will get help right away if you are not doing well or get worse.   This information is not intended to replace advice given to you by your health care provider. Make sure you discuss any  questions you have with your health care provider.   Document Released: 08/13/2002 Document Revised: 07/24/2013 Document Reviewed: 05/17/2013 Elsevier Interactive Patient Education [employer].

## 2015-11-28 NOTE — ED Notes (Signed)
Pt here with c/o left leg pain. She was seen here 12/18 for same and was told the pain is coming from her back. She was prescribed tramadol but has run out and wont be able to see her PCP until the beginning of next year but states she cant wait that long. Pt ambulatory with steady gait.

## 2015-12-08 ENCOUNTER — Emergency Department (HOSPITAL_COMMUNITY)
Admission: EM | Admit: 2015-12-08 | Discharge: 2015-12-08 | Disposition: A | Discharge status: Home / Self Care | Payer: Medicaid Other | Attending: Emergency Medicine | Admitting: Emergency Medicine

## 2015-12-08 ENCOUNTER — Encounter (HOSPITAL_COMMUNITY): Payer: Self-pay | Admitting: Emergency Medicine

## 2015-12-08 DIAGNOSIS — G8929 Other chronic pain: Secondary | ICD-10-CM | POA: Diagnosis not present

## 2015-12-08 DIAGNOSIS — M25552 Pain in left hip: Secondary | ICD-10-CM | POA: Diagnosis not present

## 2015-12-08 DIAGNOSIS — M79652 Pain in left thigh: Secondary | ICD-10-CM | POA: Insufficient documentation

## 2015-12-08 DIAGNOSIS — Z8719 Personal history of other diseases of the digestive system: Secondary | ICD-10-CM | POA: Insufficient documentation

## 2015-12-08 DIAGNOSIS — G43909 Migraine, unspecified, not intractable, without status migrainosus: Secondary | ICD-10-CM | POA: Diagnosis not present

## 2015-12-08 DIAGNOSIS — Z86018 Personal history of other benign neoplasm: Secondary | ICD-10-CM | POA: Insufficient documentation

## 2015-12-08 DIAGNOSIS — M79605 Pain in left leg: Secondary | ICD-10-CM

## 2015-12-08 DIAGNOSIS — M79662 Pain in left lower leg: Secondary | ICD-10-CM | POA: Insufficient documentation

## 2015-12-08 DIAGNOSIS — Z87828 Personal history of other (healed) physical injury and trauma: Secondary | ICD-10-CM | POA: Insufficient documentation

## 2015-12-08 DIAGNOSIS — Z87891 Personal history of nicotine dependence: Secondary | ICD-10-CM | POA: Diagnosis not present

## 2015-12-08 DIAGNOSIS — M545 Low back pain: Secondary | ICD-10-CM | POA: Insufficient documentation

## 2015-12-08 MED ORDER — TRAMADOL HCL 50 MG PO TABS
100.0000 mg | ORAL_TABLET | Freq: Once | ORAL | Status: AC
  Administered 2015-12-08: 100 mg via ORAL
  Filled 2015-12-08: qty 2

## 2015-12-08 NOTE — Discharge Instructions (Signed)
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
## 2015-12-08 NOTE — ED Notes (Signed)
Pt A&OX4, ambulatory at d/c with steady gait, NAD 
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
## 2015-12-26 ENCOUNTER — Emergency Department (HOSPITAL_COMMUNITY)
Admission: EM | Admit: 2015-12-26 | Discharge: 2015-12-27 | Disposition: A | Discharge status: Home / Self Care | Payer: Medicaid Other | Attending: Emergency Medicine | Admitting: Emergency Medicine

## 2015-12-26 ENCOUNTER — Encounter (HOSPITAL_COMMUNITY): Payer: Self-pay | Admitting: Emergency Medicine

## 2015-12-26 DIAGNOSIS — M5432 Sciatica, left side: Secondary | ICD-10-CM | POA: Insufficient documentation

## 2015-12-26 DIAGNOSIS — X58XXXA Exposure to other specified factors, initial encounter: Secondary | ICD-10-CM | POA: Insufficient documentation

## 2015-12-26 DIAGNOSIS — Z8719 Personal history of other diseases of the digestive system: Secondary | ICD-10-CM | POA: Insufficient documentation

## 2015-12-26 DIAGNOSIS — Y9301 Activity, walking, marching and hiking: Secondary | ICD-10-CM | POA: Insufficient documentation

## 2015-12-26 DIAGNOSIS — Z86018 Personal history of other benign neoplasm: Secondary | ICD-10-CM | POA: Diagnosis not present

## 2015-12-26 DIAGNOSIS — Z87891 Personal history of nicotine dependence: Secondary | ICD-10-CM | POA: Diagnosis not present

## 2015-12-26 DIAGNOSIS — G43909 Migraine, unspecified, not intractable, without status migrainosus: Secondary | ICD-10-CM | POA: Insufficient documentation

## 2015-12-26 DIAGNOSIS — Y9289 Other specified places as the place of occurrence of the external cause: Secondary | ICD-10-CM | POA: Diagnosis not present

## 2015-12-26 DIAGNOSIS — S79912A Unspecified injury of left hip, initial encounter: Secondary | ICD-10-CM | POA: Diagnosis present

## 2015-12-26 DIAGNOSIS — Y998 Other external cause status: Secondary | ICD-10-CM | POA: Insufficient documentation

## 2015-12-26 DIAGNOSIS — Z87828 Personal history of other (healed) physical injury and trauma: Secondary | ICD-10-CM | POA: Diagnosis not present

## 2015-12-26 MED ORDER — CYCLOBENZAPRINE HCL 10 MG PO TABS
10.0000 mg | ORAL_TABLET | Freq: Once | ORAL | Status: AC
  Administered 2015-12-26: 10 mg via ORAL
  Filled 2015-12-26: qty 1

## 2015-12-26 MED ORDER — IBUPROFEN 800 MG PO TABS
800.0000 mg | ORAL_TABLET | Freq: Once | ORAL | Status: AC
  Administered 2015-12-26: 800 mg via ORAL
  Filled 2015-12-26: qty 1

## 2015-12-26 MED ORDER — OXYCODONE-ACETAMINOPHEN 5-325 MG PO TABS
1.0000 | ORAL_TABLET | Freq: Once | ORAL | Status: AC
  Administered 2015-12-26: 1 via ORAL
  Filled 2015-12-26: qty 1

## 2015-12-26 NOTE — ED Provider Notes (Signed)
CSN: [id number]     Arrival date & time 12/26/15  2254 History  By signing my name below, I, Brianna Velez, attest that this documentation has been prepared under the direction and in the presence of Brianna Fuel, MD. Electronically Signed: [name], ED Scribe. 12/26/2015. 11:34 PM.     Chief Complaint  Patient presents with  . Hip Pain   The history is provided by the patient. No language interpreter was used.   HPI Comments: Brianna Velez is a 40 y.o. female brought in by ambulance, who presents to the Emergency Department complaining of sudden left hip pain onset tonight PTA. Pt rates the severity of the pain 10/10. Pt states that she was walking and felt a pop. She states that the pain radiates to her lower back and down into her thing. Pt doesn't report any alleviating or worsening factors due to not ambulating or moving the hip. Pt doesn't report any other injury or trauma to the hip. Pt doesn't report numbness or tingling.   Past Medical History  Diagnosis Date  . Migraine   . Leg pain   . Fibroids   . History of hiatal hernia    Past Surgical History  Procedure Laterality Date  . Tubal ligation    . Wisdom tooth extraction    . Bilateral salpingectomy Bilateral 09/29/2015    Procedure: BILATERAL SALPINGECTOMY;  Surgeon: Mora Bellman, MD;  Location: [facility];  Service: Gynecology;  Laterality: Bilateral;  . Abdominal hysterectomy  09/29/2015    Procedure: HYSTERECTOMY ABDOMINAL;  Surgeon: Mora Bellman, MD;  Location: [facility];  Service: Gynecology;;   Family History  Problem Relation Age of Onset  . Diabetes Sister   . Heart disease Paternal Grandmother   . Hypertension Father   . Dementia Mother   . Hypertension Mother    Social History  Substance Use Topics  . Smoking status: Former Research scientist (life sciences)  . Smokeless tobacco: Never Used  . Alcohol Use: No   OB History    Gravida Para Term Preterm AB TAB SAB Ectopic Multiple Living   2 2 2  0 0 0 0 0 0 2      Review  of Systems  Musculoskeletal: Positive for arthralgias.  Neurological: Negative for numbness.  All other systems reviewed and are negative.    Allergies  Review of patient's allergies indicates no known allergies.  Home Medications   Prior to Admission medications   Medication Sig Start Date End Date Taking? Authorizing Provider  Aspirin-Salicylamide-Caffeine (BC HEADACHE POWDER PO) Take 1 Package by mouth daily as needed (pain).    Historical Provider, MD  ibuprofen (ADVIL,MOTRIN) 200 MG tablet Take 400 mg by mouth every 6 (six) hours as needed for moderate pain.    Historical Provider, MD  ibuprofen (ADVIL,MOTRIN) 600 MG tablet Take 1 tablet (600 mg total) by mouth every 6 (six) hours as needed. Patient not taking: Reported on 11/04/2015 10/21/15   Brianna Rice, MD  ibuprofen (ADVIL,MOTRIN) 800 MG tablet Take 1 tablet (800 mg total) by mouth every 8 (eight) hours as needed. Patient not taking: Reported on 11/04/2015 10/23/15   Brianna Velez, CNM  metoCLOPramide (REGLAN) 10 MG tablet Take 1 tablet (10 mg total) by mouth every 6 (six) hours as needed for nausea (nausea/headache). Patient not taking: Reported on 11/04/2015 10/21/15   Brianna Rice, MD  oxyCODONE-acetaminophen (PERCOCET/ROXICET) 5-325 MG tablet Take 1-2 tablets by mouth every 6 (six) hours as needed. Patient not taking: Reported on 11/04/2015 10/23/15   Brianna  Constant, MD  traMADol (ULTRAM) 50 MG tablet Take 1 tablet (50 mg total) by mouth every 6 (six) hours as needed. 11/22/15   Brianna Rose, PA-C   BP 110/78 mmHg  Pulse 92  Temp(Src) 99 F (37.2 C) (Oral)  Resp 15  Ht 5\' 2"  (1.575 m)  Wt 90 lb (40.824 kg)  BMI 16.46 kg/m2  SpO2 100%  LMP 08/15/2015 (Approximate)   Physical Exam  Constitutional: She is oriented to person, place, and time. She appears well-developed and well-nourished.  Crying.   HENT:  Head: Normocephalic and atraumatic.  Eyes: Conjunctivae and EOM are normal. Pupils are equal, round,  and reactive to light.  Neck: Normal range of motion. Neck supple. No JVD present.  Cardiovascular: Normal rate, regular rhythm and normal heart sounds.   No murmur heard. Pulmonary/Chest: Effort normal and breath sounds normal. She has no wheezes. She has no rales. She exhibits no tenderness.  Abdominal: Soft. Bowel sounds are normal. She exhibits no distension and no mass. There is no tenderness.  Musculoskeletal: Normal range of motion. She exhibits tenderness. She exhibits no edema.  Moderate bilateral para lumbar spasm , +SLR bilaterally at 45 degrees, mild tenderness posterior and lateral left hip but full ROM of the hip, distal NVI.   Lymphadenopathy:    She has no cervical adenopathy.  Neurological: She is alert and oriented to person, place, and time. No cranial nerve deficit. She exhibits normal muscle tone. Coordination normal.  Skin: Skin is warm and dry. No rash noted.  Psychiatric: She has a normal mood and affect. Her behavior is normal. Judgment and thought content normal.  Nursing note and vitals reviewed.   ED Course  Procedures (including critical care time) DIAGNOSTIC STUDIES: Oxygen Saturation is 100% on RA, normal by my interpretation.    COORDINATION OF CARE: 11:32 PM-Discussed treatment plan with pt at bedside and pt agreed to plan.    Imaging Review Dg Hip Unilat With Pelvis 2-3 Views Left  12/27/2015  CLINICAL DATA:  40 year old female with left hip pain. EXAM: DG HIP (WITH OR WITHOUT PELVIS) 2-3V LEFT COMPARISON:  Radiograph dated 11/22/2015 FINDINGS: There is no evidence of hip fracture or dislocation. There is no evidence of arthropathy or other focal bone abnormality. IMPRESSION: Negative. Electronically Signed   By: Brianna Velez M.D.   On: 12/27/2015 01:18     MDM   Final diagnoses:  Left sided sciatica   Complaint of hip pain but no evidence of hip dysfunction. I strongly suspect that this is radicular pain. Old records were reviewed and as she  was seen in the ED on January 3 for left hip and thigh pain and on December 18 for left hip pain. Hip x-rays are obtained which show no abnormalities. She was given an dose of ibuprofen, cyclobenzaprine, and oxycodone-acetaminophen. Following this, patient stated that the pain was not any different but she was no longer tearful and seemed to be resting comfortably. She was given a second dose of oxycodone-acetaminophen and stated that she was getting excellent relief of pain at that point. She is discharged with prescriptions for naproxen, cyclobenzaprine, and oxycodone-acetaminophen. Referred back to PCP for follow-up. Of note, I did check her record on the [place] controlled substance reporting website and her last Percocet prescription was filled on November 18 for 20 tablets.   I personally performed the services described in this documentation, which was scribed in my presence. The recorded information has been reviewed and is accurate.  Brianna Fuel, MD [id number]
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
## 2015-12-27 ENCOUNTER — Emergency Department (HOSPITAL_COMMUNITY): Payer: Medicaid Other

## 2015-12-27 MED ORDER — OXYCODONE-ACETAMINOPHEN 5-325 MG PO TABS: 1.0000 | ORAL_TABLET | ORAL | Status: DC | PRN

## 2015-12-27 MED ORDER — NAPROXEN 500 MG PO TABS: 500.0000 mg | ORAL_TABLET | Freq: Two times a day (BID) | ORAL | Status: DC

## 2015-12-27 MED ORDER — CYCLOBENZAPRINE HCL 10 MG PO TABS: 10.0000 mg | ORAL_TABLET | Freq: Three times a day (TID) | ORAL | Status: DC | PRN

## 2015-12-27 MED ORDER — OXYCODONE-ACETAMINOPHEN 5-325 MG PO TABS
1.0000 | ORAL_TABLET | Freq: Once | ORAL | Status: AC
  Administered 2015-12-27: 1 via ORAL
  Filled 2015-12-27: qty 1

## 2015-12-27 NOTE — Discharge Instructions (Signed)
Sciatica °Sciatica is pain, weakness, numbness, or tingling along the path of the sciatic nerve. The nerve starts in the lower back and runs down the back of each leg. The nerve controls the muscles in the lower leg and in the back of the knee, while also providing sensation to the back of the thigh, lower leg, and the sole of your foot. Sciatica is a symptom of another medical condition. For instance, nerve damage or certain conditions, such as a herniated disk or bone spur on the spine, pinch or put pressure on the sciatic nerve. This causes the pain, weakness, or other sensations normally associated with sciatica. Generally, sciatica only affects one side of the body. °CAUSES  °· Herniated or slipped disc. °· Degenerative disk disease. °· A pain disorder involving the narrow muscle in the buttocks (piriformis syndrome). °· Pelvic injury or fracture. °· Pregnancy. °· Tumor (rare). °SYMPTOMS  °Symptoms can vary from mild to very severe. The symptoms usually travel from the low back to the buttocks and down the back of the leg. Symptoms can include: °· Mild tingling or dull aches in the lower back, leg, or hip. °· Numbness in the back of the calf or sole of the foot. °· Burning sensations in the lower back, leg, or hip. °· Sharp pains in the lower back, leg, or hip. °· Leg weakness. °· Severe back pain inhibiting movement. °These symptoms may get worse with coughing, sneezing, laughing, or prolonged sitting or standing. Also, being overweight may worsen symptoms. °DIAGNOSIS  °Your caregiver will perform a physical exam to look for common symptoms of sciatica. He or she may ask you to do certain movements or activities that would trigger sciatic nerve pain. Other tests may be performed to find the cause of the sciatica. These may include: °· Blood tests. °· X-rays. °· Imaging tests, such as an MRI or CT scan. °TREATMENT  °Treatment is directed at the cause of the sciatic pain. Sometimes, treatment is not necessary  and the pain and discomfort goes away on its own. If treatment is needed, your caregiver may suggest: °· Over-the-counter medicines to relieve pain. °· Prescription medicines, such as anti-inflammatory medicine, muscle relaxants, or narcotics. °· Applying heat or ice to the painful area. °· Steroid injections to lessen pain, irritation, and inflammation around the nerve. °· Reducing activity during periods of pain. °· Exercising and stretching to strengthen your abdomen and improve flexibility of your spine. Your caregiver may suggest losing weight if the extra weight makes the back pain worse. °· Physical therapy. °· Surgery to eliminate what is pressing or pinching the nerve, such as a bone spur or part of a herniated disk. °HOME CARE INSTRUCTIONS  °· Only take over-the-counter or prescription medicines for pain or discomfort as directed by your caregiver. °· Apply ice to the affected area for 20 minutes, 3-4 times a day for the first 48-72 hours. Then try heat in the same way. °· Exercise, stretch, or perform your usual activities if these do not aggravate your pain. °· Attend physical therapy sessions as directed by your caregiver. °· Keep all follow-up appointments as directed by your caregiver. °· Do not wear high heels or shoes that do not provide proper support. °· Check your mattress to see if it is too soft. A firm mattress may lessen your pain and discomfort. °SEEK IMMEDIATE MEDICAL CARE IF:  °· You lose control of your bowel or bladder (incontinence). °· You have increasing weakness in the lower back, pelvis, buttocks,   or legs. °· You have redness or swelling of your back. °· You have a burning sensation when you urinate. °· You have pain that gets worse when you lie down or awakens you at night. °· Your pain is worse than you have experienced in the past. °· Your pain is lasting longer than 4 weeks. °· You are suddenly losing weight without reason. °MAKE SURE YOU: °· Understand these  instructions. °· Will watch your condition. °· Will get help right away if you are not doing well or get worse. °  °This information is not intended to replace advice given to you by your health care provider. Make sure you discuss any questions you have with your health care provider. °  °Document Released: 11/15/2001 Document Revised: 08/12/2015 Document Reviewed: 04/01/2012 °Elsevier Interactive Patient Education ©2016 [employer] ° °Naproxen and naproxen sodium oral immediate-release tablets °What is this medicine? °NAPROXEN (na PROX en) is a non-steroidal anti-inflammatory drug (NSAID). It is used to reduce swelling and to treat pain. This medicine may be used for dental pain, headache, or painful monthly periods. It is also used for painful joint and muscular problems such as arthritis, tendinitis, bursitis, and gout. °This medicine may be used for other purposes; ask your health care provider or pharmacist if you have questions. °What should I tell my health care provider before I take this medicine? °They need to know if you have any of these conditions: °-asthma °-cigarette smoker °-drink more than 3 alcohol containing drinks a day °-heart disease or circulation problems such as heart failure or leg edema (fluid retention) °-high blood pressure °-kidney disease °-liver disease °-stomach bleeding or ulcers °-an unusual or allergic reaction to naproxen, aspirin, other NSAIDs, other medicines, foods, dyes, or preservatives °-pregnant or trying to get pregnant °-breast-feeding °How should I use this medicine? °Take this medicine by mouth with a glass of water. Follow the directions on the prescription label. Take it with food if your stomach gets upset. Try to not lie down for at least 10 minutes after you take it. Take your medicine at regular intervals. Do not take your medicine more often than directed. Long-term, continuous use may increase the risk of heart attack or stroke. °A special MedGuide will be  given to you by the pharmacist with each prescription and refill. Be sure to read this information carefully each time. °Talk to your pediatrician regarding the use of this medicine in children. Special care may be needed. °Overdosage: If you think you have taken too much of this medicine contact a poison control center or emergency room at once. °NOTE: This medicine is only for you. Do not share this medicine with others. °What if I miss a dose? °If you miss a dose, take it as soon as you can. If it is almost time for your next dose, take only that dose. Do not take double or extra doses. °What may interact with this medicine? °-alcohol °-aspirin °-cidofovir °-diuretics °-lithium °-methotrexate °-other drugs for inflammation like ketorolac or prednisone °-pemetrexed °-probenecid °-warfarin °This list may not describe all possible interactions. Give your health care provider a list of all the medicines, herbs, non-prescription drugs, or dietary supplements you use. Also tell them if you smoke, drink alcohol, or use illegal drugs. Some items may interact with your medicine. °What should I watch for while using this medicine? °Tell your doctor or health care professional if your pain does not get better. Talk to your doctor before taking another medicine for pain. Do   not treat yourself. This medicine does not prevent heart attack or stroke. In fact, this medicine may increase the chance of a heart attack or stroke. The chance may increase with longer use of this medicine and in people who have heart disease. If you take aspirin to prevent heart attack or stroke, talk with your doctor or health care professional. Do not take other medicines that contain aspirin, ibuprofen, or naproxen with this medicine. Side effects such as stomach upset, nausea, or ulcers may be more likely to occur. Many medicines available without a prescription should not be taken with this medicine. This medicine can cause ulcers and bleeding  in the stomach and intestines at any time during treatment. Do not smoke cigarettes or drink alcohol. These increase irritation to your stomach and can make it more susceptible to damage from this medicine. Ulcers and bleeding can happen without warning symptoms and can cause death. You may get drowsy or dizzy. Do not drive, use machinery, or do anything that needs mental alertness until you know how this medicine affects you. Do not stand or sit up quickly, especially if you are an older patient. This reduces the risk of dizzy or fainting spells. This medicine can cause you to bleed more easily. Try to avoid damage to your teeth and gums when you brush or floss your teeth. What side effects may I notice from receiving this medicine? Side effects that you should report to your doctor or health care professional as soon as possible: -black or bloody stools, blood in the urine or vomit -blurred vision -chest pain -difficulty breathing or wheezing -nausea or vomiting -severe stomach pain -skin rash, skin redness, blistering or peeling skin, hives, or itching -slurred speech or weakness on one side of the body -swelling of eyelids, throat, lips -unexplained weight gain or swelling -unusually weak or tired -yellowing of eyes or skin Side effects that usually do not require medical attention (report to your doctor or health care professional if they continue or are bothersome): -constipation -headache -heartburn This list may not describe all possible side effects. Call your doctor for medical advice about side effects. You may report side effects to FDA at 1-800-FDA-1088. Where should I keep my medicine? Keep out of the reach of children. Store at room temperature between 15 and 30 degrees C (59 and 86 degrees F). Keep container tightly closed. Throw away any unused medicine after the expiration date. NOTE: This sheet is a summary. It may not cover all possible information. If you have questions  about this medicine, talk to your doctor, pharmacist, or health care provider.    2016, Elsevier/Gold Standard. (2009-11-23 20:10:16)  Cyclobenzaprine tablets What is this medicine? CYCLOBENZAPRINE (sye kloe BEN za preen) is a muscle relaxer. It is used to treat muscle pain, spasms, and stiffness. This medicine may be used for other purposes; ask your health care provider or pharmacist if you have questions. What should I tell my health care provider before I take this medicine? They need to know if you have any of these conditions: -heart disease, irregular heartbeat, or previous heart attack -liver disease -thyroid problem -an unusual or allergic reaction to cyclobenzaprine, tricyclic antidepressants, lactose, other medicines, foods, dyes, or preservatives -pregnant or trying to get pregnant -breast-feeding How should I use this medicine? Take this medicine by mouth with a glass of water. Follow the directions on the prescription label. If this medicine upsets your stomach, take it with food or milk. Take your medicine at regular intervals.  Do not take it more often than directed. Talk to your pediatrician regarding the use of this medicine in children. Special care may be needed. Overdosage: If you think you have taken too much of this medicine contact a poison control center or emergency room at once. NOTE: This medicine is only for you. Do not share this medicine with others. What if I miss a dose? If you miss a dose, take it as soon as you can. If it is almost time for your next dose, take only that dose. Do not take double or extra doses. What may interact with this medicine? Do not take this medicine with any of the following medications: -certain medicines for fungal infections like fluconazole, itraconazole, ketoconazole, posaconazole, voriconazole -cisapride -dofetilide -dronedarone -droperidol -flecainide -grepafloxacin -halofantrine -levomethadyl -MAOIs like Carbex,  Eldepryl, Marplan, Nardil, and Parnate -nilotinib -pimozide -probucol -sertindole -thioridazine -ziprasidone This medicine may also interact with the following medications: -abarelix -alcohol -certain medicines for cancer -certain medicines for depression, anxiety, or psychotic disturbances -certain medicines for infection like alfuzosin, chloroquine, clarithromycin, levofloxacin, mefloquine, pentamidine, troleandomycin -certain medicines for an irregular heart beat -certain medicines used for sleep or numbness during surgery or procedure -contrast dyes -dolasetron -guanethidine -methadone -octreotide -ondansetron -other medicines that prolong the QT interval (cause an abnormal heart rhythm) -palonosetron -phenothiazines like chlorpromazine, mesoridazine, prochlorperazine, thioridazine -tramadol -vardenafil This list may not describe all possible interactions. Give your health care provider a list of all the medicines, herbs, non-prescription drugs, or dietary supplements you use. Also tell them if you smoke, drink alcohol, or use illegal drugs. Some items may interact with your medicine. What should I watch for while using this medicine? Check with your doctor or health care professional if your condition does not improve within 1 to 3 weeks. You may get drowsy or dizzy when you first start taking the medicine or change doses. Do not drive, use machinery, or do anything that may be dangerous until you know how the medicine affects you. Stand or sit up slowly. Your mouth may get dry. Drinking water, chewing sugarless gum, or sucking on hard candy may help. What side effects may I notice from receiving this medicine? Side effects that you should report to your doctor or health care professional as soon as possible: -allergic reactions like skin rash, itching or hives, swelling of the face, lips, or tongue -chest pain -fast heartbeat -hallucinations -seizures -vomiting Side  effects that usually do not require medical attention (report to your doctor or health care professional if they continue or are bothersome): -headache This list may not describe all possible side effects. Call your doctor for medical advice about side effects. You may report side effects to FDA at 1-800-FDA-1088. Where should I keep my medicine? Keep out of the reach of children. Store at room temperature between 15 and 30 degrees C (59 and 86 degrees F). Keep container tightly closed. Throw away any unused medicine after the expiration date. NOTE: This sheet is a summary. It may not cover all possible information. If you have questions about this medicine, talk to your doctor, pharmacist, or health care provider.    2016, Elsevier/Gold Standard. (2013-06-18 12:48:19)  Acetaminophen; Oxycodone tablets What is this medicine? ACETAMINOPHEN; OXYCODONE (a set a MEE noe fen; ox i KOE done) is a pain reliever. It is used to treat moderate to severe pain. This medicine may be used for other purposes; ask your health care provider or pharmacist if you have questions. What should I tell my health  care provider before I take this medicine? They need to know if you have any of these conditions: -brain tumor -Crohn's disease, inflammatory bowel disease, or ulcerative colitis -drug abuse or addiction -head injury -heart or circulation problems -if you often drink alcohol -kidney disease or problems going to the bathroom -liver disease -lung disease, asthma, or breathing problems -an unusual or allergic reaction to acetaminophen, oxycodone, other opioid analgesics, other medicines, foods, dyes, or preservatives -pregnant or trying to get pregnant -breast-feeding How should I use this medicine? Take this medicine by mouth with a full glass of water. Follow the directions on the prescription label. You can take it with or without food. If it upsets your stomach, take it with food. Take your medicine  at regular intervals. Do not take it more often than directed. Talk to your pediatrician regarding the use of this medicine in children. Special care may be needed. Patients over 76 years old may have a stronger reaction and need a smaller dose. Overdosage: If you think you have taken too much of this medicine contact a poison control center or emergency room at once. NOTE: This medicine is only for you. Do not share this medicine with others. What if I miss a dose? If you miss a dose, take it as soon as you can. If it is almost time for your next dose, take only that dose. Do not take double or extra doses. What may interact with this medicine? -alcohol -antihistamines -barbiturates like amobarbital, butalbital, butabarbital, methohexital, pentobarbital, phenobarbital, thiopental, and secobarbital -benztropine -drugs for bladder problems like solifenacin, trospium, oxybutynin, tolterodine, hyoscyamine, and methscopolamine -drugs for breathing problems like ipratropium and tiotropium -drugs for certain stomach or intestine problems like propantheline, homatropine methylbromide, glycopyrrolate, atropine, belladonna, and dicyclomine -general anesthetics like etomidate, ketamine, nitrous oxide, propofol, desflurane, enflurane, halothane, isoflurane, and sevoflurane -medicines for depression, anxiety, or psychotic disturbances -medicines for sleep -muscle relaxants -naltrexone -narcotic medicines (opiates) for pain -phenothiazines like perphenazine, thioridazine, chlorpromazine, mesoridazine, fluphenazine, prochlorperazine, promazine, and trifluoperazine -scopolamine -tramadol -trihexyphenidyl This list may not describe all possible interactions. Give your health care provider a list of all the medicines, herbs, non-prescription drugs, or dietary supplements you use. Also tell them if you smoke, drink alcohol, or use illegal drugs. Some items may interact with your medicine. What should I watch  for while using this medicine? Tell your doctor or health care professional if your pain does not go away, if it gets worse, or if you have new or a different type of pain. You may develop tolerance to the medicine. Tolerance means that you will need a higher dose of the medication for pain relief. Tolerance is normal and is expected if you take this medicine for a long time. Do not suddenly stop taking your medicine because you may develop a severe reaction. Your body becomes used to the medicine. This does NOT mean you are addicted. Addiction is a behavior related to getting and using a drug for a non-medical reason. If you have pain, you have a medical reason to take pain medicine. Your doctor will tell you how much medicine to take. If your doctor wants you to stop the medicine, the dose will be slowly lowered over time to avoid any side effects. You may get drowsy or dizzy. Do not drive, use machinery, or do anything that needs mental alertness until you know how this medicine affects you. Do not stand or sit up quickly, especially if you are an older patient. This reduces the  risk of dizzy or fainting spells. Alcohol may interfere with the effect of this medicine. Avoid alcoholic drinks. There are different types of narcotic medicines (opiates) for pain. If you take more than one type at the same time, you may have more side effects. Give your health care provider a list of all medicines you use. Your doctor will tell you how much medicine to take. Do not take more medicine than directed. Call emergency for help if you have problems breathing. The medicine will cause constipation. Try to have a bowel movement at least every 2 to 3 days. If you do not have a bowel movement for 3 days, call your doctor or health care professional. Do not take Tylenol (acetaminophen) or medicines that have acetaminophen with this medicine. Too much acetaminophen can be very dangerous. Many nonprescription medicines contain  acetaminophen. Always read the labels carefully to avoid taking more acetaminophen. What side effects may I notice from receiving this medicine? Side effects that you should report to your doctor or health care professional as soon as possible: -allergic reactions like skin rash, itching or hives, swelling of the face, lips, or tongue -breathing difficulties, wheezing -confusion -light headedness or fainting spells -severe stomach pain -unusually weak or tired -yellowing of the skin or the whites of the eyes Side effects that usually do not require medical attention (report to your doctor or health care professional if they continue or are bothersome): -dizziness -drowsiness -nausea -vomiting This list may not describe all possible side effects. Call your doctor for medical advice about side effects. You may report side effects to FDA at 1-800-FDA-1088. Where should I keep my medicine? Keep out of the reach of children. This medicine can be abused. Keep your medicine in a safe place to protect it from theft. Do not share this medicine with anyone. Selling or giving away this medicine is dangerous and against the law. This medicine may cause accidental overdose and death if it taken by other adults, children, or pets. Mix any unused medicine with a substance like cat litter or coffee grounds. Then throw the medicine away in a sealed container like a sealed bag or a coffee can with a lid. Do not use the medicine after the expiration date. Store at room temperature between 20 and 25 degrees C (68 and 77 degrees F). NOTE: This sheet is a summary. It may not cover all possible information. If you have questions about this medicine, talk to your doctor, pharmacist, or health care provider.    2016, [employer] Standard. (2014-10-22 15:18:46)

## 2015-12-31 ENCOUNTER — Encounter: Payer: Self-pay | Admitting: Internal Medicine

## 2015-12-31 ENCOUNTER — Ambulatory Visit: Payer: Medicaid Other | Attending: Internal Medicine | Admitting: Internal Medicine

## 2015-12-31 VITALS — BP 109/77 | HR 88 | Temp 98.4°F | Resp 16 | Ht 62.0 in | Wt 94.8 lb

## 2015-12-31 DIAGNOSIS — M25552 Pain in left hip: Secondary | ICD-10-CM | POA: Diagnosis not present

## 2015-12-31 DIAGNOSIS — D259 Leiomyoma of uterus, unspecified: Secondary | ICD-10-CM | POA: Insufficient documentation

## 2015-12-31 DIAGNOSIS — Z90710 Acquired absence of both cervix and uterus: Secondary | ICD-10-CM | POA: Diagnosis not present

## 2015-12-31 DIAGNOSIS — Z79899 Other long term (current) drug therapy: Secondary | ICD-10-CM | POA: Diagnosis not present

## 2015-12-31 DIAGNOSIS — Z7982 Long term (current) use of aspirin: Secondary | ICD-10-CM | POA: Diagnosis not present

## 2015-12-31 DIAGNOSIS — Z131 Encounter for screening for diabetes mellitus: Secondary | ICD-10-CM | POA: Diagnosis not present

## 2015-12-31 MED ORDER — TRAMADOL HCL 50 MG PO TABS: 50.0000 mg | ORAL_TABLET | Freq: Four times a day (QID) | ORAL | Status: DC | PRN

## 2015-12-31 MED ORDER — DICLOFENAC SODIUM 1 % TD GEL: 4.0000 g | Freq: Four times a day (QID) | TRANSDERMAL | Status: DC

## 2015-12-31 NOTE — Patient Instructions (Signed)

## 2015-12-31 NOTE — Progress Notes (Signed)
Patient c/o left leg pain.   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  Rated pain at 9/10. Described as sharp aching pain, off and on.  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  Patient tried naproxen, ibuprofen, heating pads, but pain has not subsided.   Patient requesting refill of Tramadol.  Patient decline flu shot, and A1c screening at this time.
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
## 2016-01-26 ENCOUNTER — Encounter (HOSPITAL_COMMUNITY): Payer: Self-pay | Admitting: Emergency Medicine

## 2016-01-26 ENCOUNTER — Emergency Department (INDEPENDENT_AMBULATORY_CARE_PROVIDER_SITE_OTHER)
Admission: EM | Admit: 2016-01-26 | Discharge: 2016-01-26 | Disposition: A | Discharge status: Home / Self Care | Payer: Medicaid Other | Source: Home / Self Care | Attending: Family Medicine | Admitting: Family Medicine

## 2016-01-26 ENCOUNTER — Telehealth: Payer: Self-pay | Admitting: Internal Medicine

## 2016-01-26 ENCOUNTER — Other Ambulatory Visit: Payer: Self-pay | Admitting: *Deleted

## 2016-01-26 DIAGNOSIS — M5432 Sciatica, left side: Secondary | ICD-10-CM | POA: Diagnosis not present

## 2016-01-26 MED ORDER — TRAMADOL HCL 50 MG PO TABS: 50.0000 mg | ORAL_TABLET | Freq: Four times a day (QID) | ORAL | Status: DC | PRN

## 2016-01-26 NOTE — Telephone Encounter (Signed)
Pt. Called requesting a med refill on Tramadol. Please f/u with pt. °

## 2016-01-26 NOTE — Discharge Instructions (Signed)
Back Exercises If you have pain in your back, do these exercises 2-3 times each day or as told by your doctor. When the pain goes away, do the exercises once each day, but repeat the steps more times for each exercise (do more repetitions). If you do not have pain in your back, do these exercises once each day or as told by your doctor. EXERCISES Single Knee to Chest Do these steps 3-5 times in a row for each leg:  Lie on your back on a firm bed or the floor with your legs stretched out.  Bring one knee to your chest.  Hold your knee to your chest by grabbing your knee or thigh.  Pull on your knee until you feel a gentle stretch in your lower back.  Keep doing the stretch for 10-30 seconds.  Slowly let go of your leg and straighten it. Pelvic Tilt Do these steps 5-10 times in a row:  Lie on your back on a firm bed or the floor with your legs stretched out.  Bend your knees so they point up to the ceiling. Your feet should be flat on the floor.  Tighten your lower belly (abdomen) muscles to press your lower back against the floor. This will make your tailbone point up to the ceiling instead of pointing down to your feet or the floor.  Stay in this position for 5-10 seconds while you gently tighten your muscles and breathe evenly. Cat-Cow Do these steps until your lower back bends more easily:  Get on your hands and knees on a firm surface. Keep your hands under your shoulders, and keep your knees under your hips. You may put padding under your knees.  Let your head hang down, and make your tailbone point down to the floor so your lower back is round like the back of a cat.  Stay in this position for 5 seconds.  Slowly lift your head and make your tailbone point up to the ceiling so your back hangs low (sags) like the back of a cow.  Stay in this position for 5 seconds. Press-Ups Do these steps 5-10 times in a row: 1. Lie on your belly (face-down) on the floor. 2. Place your  hands near your head, about shoulder-width apart. 3. While you keep your back relaxed and keep your hips on the floor, slowly straighten your arms to raise the top half of your body and lift your shoulders. Do not use your back muscles. To make yourself more comfortable, you may change where you place your hands. 4. Stay in this position for 5 seconds. 5. Slowly return to lying flat on the floor. Bridges Do these steps 10 times in a row: 1. Lie on your back on a firm surface. 2. Bend your knees so they point up to the ceiling. Your feet should be flat on the floor. 3. Tighten your butt muscles and lift your butt off of the floor until your waist is almost as high as your knees. If you do not feel the muscles working in your butt and the back of your thighs, slide your feet 1-2 inches farther away from your butt. 4. Stay in this position for 3-5 seconds. 5. Slowly lower your butt to the floor, and let your butt muscles relax. If this exercise is too easy, try doing it with your arms crossed over your chest. Belly Crunches Do these steps 5-10 times in a row: 1. Lie on your back on a firm bed  or the floor with your legs stretched out. 2. Bend your knees so they point up to the ceiling. Your feet should be flat on the floor. 3. Cross your arms over your chest. 4. Tip your chin a little bit toward your chest but do not bend your neck. 5. Tighten your belly muscles and slowly raise your chest just enough to lift your shoulder blades a tiny bit off of the floor. 6. Slowly lower your chest and your head to the floor. Back Lifts Do these steps 5-10 times in a row: 1. Lie on your belly (face-down) with your arms at your sides, and rest your forehead on the floor. 2. Tighten the muscles in your legs and your butt. 3. Slowly lift your chest off of the floor while you keep your hips on the floor. Keep the back of your head in line with the curve in your back. Look at the floor while you do this. 4. Stay  in this position for 3-5 seconds. 5. Slowly lower your chest and your face to the floor. GET HELP IF:  Your back pain gets a lot worse when you do an exercise.  Your back pain does not lessen 2 hours after you exercise. If you have any of these problems, stop doing the exercises. Do not do them again unless your doctor says it is okay. GET HELP RIGHT AWAY IF:  You have sudden, very bad back pain. If this happens, stop doing the exercises. Do not do them again unless your doctor says it is okay.   This information is not intended to replace advice given to you by your health care provider. Make sure you discuss any questions you have with your health care provider.   Document Released: 12/24/2010 Document Revised: 08/12/2015 Document Reviewed: 01/15/2015 Elsevier Interactive Patient Education 2016 Elsevier Inc. Sciatica Sciatica is pain, weakness, numbness, or tingling along your sciatic nerve. The nerve starts in the lower back and runs down the back of each leg. Nerve damage or certain conditions pinch or put pressure on the sciatic nerve. This causes the pain, weakness, and other discomforts of sciatica. HOME CARE   Only take medicine as told by your doctor.  Apply ice to the affected area for 20 minutes. Do this 3-4 times a day for the first 48-72 hours. Then try heat in the same way.  Exercise, stretch, or do your usual activities if these do not make your pain worse.  Go to physical therapy as told by your doctor.  Keep all doctor visits as told.  Do not wear high heels or shoes that are not supportive.  Get a firm mattress if your mattress is too soft to lessen pain and discomfort. GET HELP RIGHT AWAY IF:   You cannot control when you poop (bowel movement) or pee (urinate).  You have more weakness in your lower back, lower belly (pelvis), butt (buttocks), or legs.  You have redness or puffiness (swelling) of your back.  You have a burning feeling when you pee.  You  have pain that gets worse when you lie down.  You have pain that wakes you from your sleep.  Your pain is worse than past pain.  Your pain lasts longer than 4 weeks.  You are suddenly losing weight without reason. MAKE SURE YOU:   Understand these instructions.  Will watch this condition.  Will get help right away if you are not doing well or get worse.   This information is not intended  to replace advice given to you by your health care provider. Make sure you discuss any questions you have with your health care provider.   Document Released: 08/30/2008 Document Revised: 08/12/2015 Document Reviewed: 04/01/2012 Elsevier Interactive Patient Education [employer].
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
## 2016-01-26 NOTE — ED Notes (Signed)
Left buttocks pain, pain from left back to buttocks. Reports a cycle of pain episodes for 10 plus years.  No recent injury.

## 2016-01-26 NOTE — Telephone Encounter (Signed)
Patient verified DOB Patient admitted to receiving a refill in the ED. Patient made request prior to going to the ED. Patient had no further questions at this time.

## 2016-02-01 NOTE — Telephone Encounter (Signed)
Pt is requesting for TRAMADOL...[phone]....please follow up with patient

## 2016-02-01 NOTE — Telephone Encounter (Signed)
Pt. Called requesting a med refill on Tramadol. Pt. Stated she takes two at a time and that's why she runs out of the medication fast. Please f/u with pt.

## 2016-02-02 NOTE — Telephone Encounter (Signed)
Medical Assistant left message on patient's home and cell voicemail. Voicemail states to give a call back to [place] with [facility] at [phone].  !!!Please inform patient of refill request being denied due to request being too soon. Patient may request 02/26/16!!!

## 2016-02-03 ENCOUNTER — Emergency Department (HOSPITAL_COMMUNITY)
Admission: EM | Admit: 2016-02-03 | Discharge: 2016-02-03 | Disposition: A | Discharge status: Home / Self Care | Payer: Medicaid Other | Attending: Emergency Medicine | Admitting: Emergency Medicine

## 2016-02-03 ENCOUNTER — Encounter (HOSPITAL_COMMUNITY): Payer: Self-pay

## 2016-02-03 DIAGNOSIS — R200 Anesthesia of skin: Secondary | ICD-10-CM | POA: Diagnosis not present

## 2016-02-03 DIAGNOSIS — Z8679 Personal history of other diseases of the circulatory system: Secondary | ICD-10-CM | POA: Diagnosis not present

## 2016-02-03 DIAGNOSIS — Z87891 Personal history of nicotine dependence: Secondary | ICD-10-CM | POA: Insufficient documentation

## 2016-02-03 DIAGNOSIS — Z86018 Personal history of other benign neoplasm: Secondary | ICD-10-CM | POA: Diagnosis not present

## 2016-02-03 DIAGNOSIS — Z8719 Personal history of other diseases of the digestive system: Secondary | ICD-10-CM | POA: Diagnosis not present

## 2016-02-03 DIAGNOSIS — M5432 Sciatica, left side: Secondary | ICD-10-CM | POA: Diagnosis not present

## 2016-02-03 DIAGNOSIS — M545 Low back pain: Secondary | ICD-10-CM | POA: Diagnosis present

## 2016-02-03 MED ORDER — TRAMADOL HCL 50 MG PO TABS: 50.0000 mg | ORAL_TABLET | Freq: Four times a day (QID) | ORAL | Status: DC | PRN

## 2016-02-03 MED ORDER — DEXAMETHASONE 4 MG PO TABS
10.0000 mg | ORAL_TABLET | Freq: Once | ORAL | Status: AC
  Administered 2016-02-03: 10 mg via ORAL
  Filled 2016-02-03: qty 2

## 2016-02-03 MED ORDER — NAPROXEN 375 MG PO TABS: 375.0000 mg | ORAL_TABLET | Freq: Two times a day (BID) | ORAL | Status: DC

## 2016-02-03 MED ORDER — IBUPROFEN 200 MG PO TABS
400.0000 mg | ORAL_TABLET | Freq: Once | ORAL | Status: AC
  Administered 2016-02-03: 400 mg via ORAL
  Filled 2016-02-03: qty 2

## 2016-02-03 MED ORDER — HYDROCODONE-ACETAMINOPHEN 5-325 MG PO TABS
2.0000 | ORAL_TABLET | Freq: Once | ORAL | Status: AC
  Administered 2016-02-03: 2 via ORAL
  Filled 2016-02-03: qty 2

## 2016-02-03 MED ORDER — TRAMADOL HCL 50 MG PO TABS
100.0000 mg | ORAL_TABLET | Freq: Once | ORAL | Status: AC
  Administered 2016-02-03: 100 mg via ORAL
  Filled 2016-02-03: qty 2

## 2016-02-03 NOTE — Discharge Instructions (Signed)
See your doctor for proper management of the back pain/sciatica.   Radicular Pain Radicular pain in either the arm or leg is usually from a bulging or herniated disk in the spine. A piece of the herniated disk may press against the nerves as the nerves exit the spine. This causes pain which is felt at the tips of the nerves down the arm or leg. Other causes of radicular pain may include:  Fractures.  Heart disease.  Cancer.  An abnormal and usually degenerative state of the nervous system or nerves (neuropathy). Diagnosis may require CT or MRI scanning to determine the primary cause.  Nerves that start at the neck (nerve roots) may cause radicular pain in the outer shoulder and arm. It can spread down to the thumb and fingers. The symptoms vary depending on which nerve root has been affected. In most cases radicular pain improves with conservative treatment. Neck problems may require physical therapy, a neck collar, or cervical traction. Treatment may take many weeks, and surgery may be considered if the symptoms do not improve.  Conservative treatment is also recommended for sciatica. Sciatica causes pain to radiate from the lower back or buttock area down the leg into the foot. Often there is a history of back problems. Most patients with sciatica are better after 2 to 4 weeks of rest and other supportive care. Short term bed rest can reduce the disk pressure considerably. Sitting, however, is not a good position since this increases the pressure on the disk. You should avoid bending, lifting, and all other activities which make the problem worse. Traction can be used in severe cases. Surgery is usually reserved for patients who do not improve within the first months of treatment. Only take over-the-counter or prescription medicines for pain, discomfort, or fever as directed by your caregiver. Narcotics and muscle relaxants may help by relieving more severe pain and spasm and by providing mild  sedation. Cold or massage can give significant relief. Spinal manipulation is not recommended. It can increase the degree of disc protrusion. Epidural steroid injections are often effective treatment for radicular pain. These injections deliver medicine to the spinal nerve in the space between the protective covering of the spinal cord and back bones (vertebrae). Your caregiver can give you more information about steroid injections. These injections are most effective when given within two weeks of the onset of pain.  You should see your caregiver for follow up care as recommended. A program for neck and back injury rehabilitation with stretching and strengthening exercises is an important part of management.  SEEK IMMEDIATE MEDICAL CARE IF:  You develop increased pain, weakness, or numbness in your arm or leg.  You develop difficulty with bladder or bowel control.  You develop abdominal pain.   This information is not intended to replace advice given to you by your health care provider. Make sure you discuss any questions you have with your health care provider.   Document Released: 12/29/2004 Document Revised: 12/12/2014 Document Reviewed: 06/17/2015 Elsevier Interactive Patient Education 2016 Elsevier Inc.  Sciatica Sciatica is pain, weakness, numbness, or tingling along the path of the sciatic nerve. The nerve starts in the lower back and runs down the back of each leg. The nerve controls the muscles in the lower leg and in the back of the knee, while also providing sensation to the back of the thigh, lower leg, and the sole of your foot. Sciatica is a symptom of another medical condition. For instance, nerve damage or  certain conditions, such as a herniated disk or bone spur on the spine, pinch or put pressure on the sciatic nerve. This causes the pain, weakness, or other sensations normally associated with sciatica. Generally, sciatica only affects one side of the body. CAUSES   Herniated  or slipped disc.  Degenerative disk disease.  A pain disorder involving the narrow muscle in the buttocks (piriformis syndrome).  Pelvic injury or fracture.  Pregnancy.  Tumor (rare). SYMPTOMS  Symptoms can vary from mild to very severe. The symptoms usually travel from the low back to the buttocks and down the back of the leg. Symptoms can include:  Mild tingling or dull aches in the lower back, leg, or hip.  Numbness in the back of the calf or sole of the foot.  Burning sensations in the lower back, leg, or hip.  Sharp pains in the lower back, leg, or hip.  Leg weakness.  Severe back pain inhibiting movement. These symptoms may get worse with coughing, sneezing, laughing, or prolonged sitting or standing. Also, being overweight may worsen symptoms. DIAGNOSIS  Your caregiver will perform a physical exam to look for common symptoms of sciatica. He or she may ask you to do certain movements or activities that would trigger sciatic nerve pain. Other tests may be performed to find the cause of the sciatica. These may include:  Blood tests.  X-rays.  Imaging tests, such as an MRI or CT scan. TREATMENT  Treatment is directed at the cause of the sciatic pain. Sometimes, treatment is not necessary and the pain and discomfort goes away on its own. If treatment is needed, your caregiver may suggest:  Over-the-counter medicines to relieve pain.  Prescription medicines, such as anti-inflammatory medicine, muscle relaxants, or narcotics.  Applying heat or ice to the painful area.  Steroid injections to lessen pain, irritation, and inflammation around the nerve.  Reducing activity during periods of pain.  Exercising and stretching to strengthen your abdomen and improve flexibility of your spine. Your caregiver may suggest losing weight if the extra weight makes the back pain worse.  Physical therapy.  Surgery to eliminate what is pressing or pinching the nerve, such as a bone  spur or part of a herniated disk. HOME CARE INSTRUCTIONS   Only take over-the-counter or prescription medicines for pain or discomfort as directed by your caregiver.  Apply ice to the affected area for 20 minutes, 3-4 times a day for the first 48-72 hours. Then try heat in the same way.  Exercise, stretch, or perform your usual activities if these do not aggravate your pain.  Attend physical therapy sessions as directed by your caregiver.  Keep all follow-up appointments as directed by your caregiver.  Do not wear high heels or shoes that do not provide proper support.  Check your mattress to see if it is too soft. A firm mattress may lessen your pain and discomfort. SEEK IMMEDIATE MEDICAL CARE IF:   You lose control of your bowel or bladder (incontinence).  You have increasing weakness in the lower back, pelvis, buttocks, or legs.  You have redness or swelling of your back.  You have a burning sensation when you urinate.  You have pain that gets worse when you lie down or awakens you at night.  Your pain is worse than you have experienced in the past.  Your pain is lasting longer than 4 weeks.  You are suddenly losing weight without reason. MAKE SURE YOU:  Understand these instructions.  Will watch your  condition.  Will get help right away if you are not doing well or get worse.   This information is not intended to replace advice given to you by your health care provider. Make sure you discuss any questions you have with your health care provider.   Document Released: 11/15/2001 Document Revised: 08/12/2015 Document Reviewed: 04/01/2012 Elsevier Interactive Patient Education [employer].

## 2016-02-03 NOTE — ED Provider Notes (Signed)
CSN: [id number]     Arrival date & time 02/03/16  0106 History  By signing my name below, I, Soijett Blue, attest that this documentation has been prepared under the direction and in the presence of Varney Biles, MD. Electronically Signed: Soijett Blue, ED Scribe. 02/03/2016. 3:55 AM.   Chief Complaint  Patient presents with  . Back Pain      The history is provided by the patient. No language interpreter was used.    HPI Comments: Marqueta Monnett is a 40 y.o. female with a medical hx of sciatica who presents to the Emergency Department complaining of constant, moderate, left lower back pain onset yesterday. Pt denies any recent trauma or injury at this time. Pt states that she is out of her tramadol that she typically takes for her back pain. Pt reports that her left lower back pain radiates to her left thigh. She states that she is having associated symptoms of numbness/tingling to left thigh. She states that she has not tried any medications for the relief for her symptoms. Pt denies gait problem and any other symptoms.    Per pt chart review: Pt has been seen multiple times since 11/18/2015 for her back pain with her most recent visit being 01/26/2016. On that visit, pt was dx with sciatica and Rx tramadol for her symptoms.    Past Medical History  Diagnosis Date  . Migraine   . Leg pain   . Fibroids   . History of hiatal hernia    Past Surgical History  Procedure Laterality Date  . Tubal ligation    . Wisdom tooth extraction    . Bilateral salpingectomy Bilateral 09/29/2015    Procedure: BILATERAL SALPINGECTOMY;  Surgeon: Mora Bellman, MD;  Location: [facility];  Service: Gynecology;  Laterality: Bilateral;  . Abdominal hysterectomy  09/29/2015    Procedure: HYSTERECTOMY ABDOMINAL;  Surgeon: Mora Bellman, MD;  Location: [facility];  Service: Gynecology;;   Family History  Problem Relation Age of Onset  . Diabetes Sister   . Heart disease Paternal Grandmother   . Hypertension  Father   . Dementia Mother   . Hypertension Mother    Social History  Substance Use Topics  . Smoking status: Former Research scientist (life sciences)  . Smokeless tobacco: Never Used  . Alcohol Use: No   OB History    Gravida Para Term Preterm AB TAB SAB Ectopic Multiple Living   2 2 2  0 0 0 0 0 0 2     Review of Systems  A complete 10 system review of systems was obtained and all systems are negative except as noted in the HPI and PMH.   Allergies  Review of patient's allergies indicates no known allergies.  Home Medications   Prior to Admission medications   Medication Sig Start Date End Date Taking? Authorizing Provider  Aspirin-Salicylamide-Caffeine (BC HEADACHE POWDER PO) Take 1 Package by mouth daily as needed (pain).   Yes Historical Provider, MD  ibuprofen (ADVIL,MOTRIN) 200 MG tablet Take 200 mg by mouth every 6 (six) hours as needed for headache, mild pain or moderate pain.   Yes Historical Provider, MD  cyclobenzaprine (FLEXERIL) 10 MG tablet Take 1 tablet (10 mg total) by mouth 3 (three) times daily as needed for muscle spasms. Patient not taking: Reported on 02/03/2016 123XX123   Delora Fuel, MD  diclofenac sodium (VOLTAREN) 1 % GEL Apply 4 g topically 4 (four) times daily. Patient not taking: Reported on 02/03/2016 12/31/15   Tresa Garter, MD  naproxen (NAPROSYN) 375 MG tablet Take 1 tablet (375 mg total) by mouth 2 (two) times daily with a meal. 02/03/16   Varney Biles, MD  naproxen (NAPROSYN) 375 MG tablet Take 1 tablet (375 mg total) by mouth 2 (two) times daily. 02/03/16   Varney Biles, MD  oxyCODONE-acetaminophen (PERCOCET) 5-325 MG tablet Take 1 tablet by mouth every 4 (four) hours as needed for moderate pain. Patient not taking: Reported on 02/03/2016 123XX123   Delora Fuel, MD  traMADol (ULTRAM) 50 MG tablet Take 1 tablet (50 mg total) by mouth every 6 (six) hours as needed. 02/03/16   Varney Biles, MD   LMP 08/15/2015 (Approximate) Physical Exam  Constitutional: She is oriented to  person, place, and time. She appears well-developed and well-nourished. No distress.  HENT:  Head: Normocephalic and atraumatic.  Eyes: EOM are normal.  Neck: Neck supple.  Cardiovascular: Normal rate.   Pulmonary/Chest: Effort normal. No respiratory distress.  Musculoskeletal: Normal range of motion.  Pt has tenderness over the lumbar region No step offs, no erythema. Pt has 2+ patellar reflex bilaterally. Subjective numbness to the L thigh and + passive straight leg raise. Able to ambulate   Neurological: She is alert and oriented to person, place, and time.  Skin: Skin is warm and dry.  Psychiatric: She has a normal mood and affect. Her behavior is normal.  Nursing note and vitals reviewed.   ED Course  Procedures (including critical care time) DIAGNOSTIC STUDIES: Oxygen Saturation is 100% on RA, nl by my interpretation.    COORDINATION OF CARE: 3:55 AM Discussed treatment plan with pt at bedside which includes ibuprofen, decadron, and norco and pt agreed to plan.    Labs Review Labs Reviewed - No data to display  Imaging Review No results found.    EKG Interpretation None      MDM   Final diagnoses:  Sciatica of left side    I personally performed the services described in this documentation, which was scribed in my presence. The recorded information has been reviewed and is accurate.  Pt comes in with sciatica flare up. Oral meds provided here. No clinical concerns for cord compression as pt has no associated numbness, weakness, urinary incontinence, urinary retention, bowel incontinence, saddles anesthesia.    Varney Biles, MD 02/03/16 [phone]

## 2016-02-03 NOTE — ED Notes (Signed)
Pt complains of chronic lower back pain

## 2016-02-17 NOTE — Telephone Encounter (Signed)
Pt. Called requesting a med refill on Tramadol. Pt. Stated she went to the ED and was prescribed Tramadol but was only giving a few pills. Pt. Was informed that she could not be giving any refill until 02/26/16. Pt. Would still like to speak to the nurse. Please f/u

## 2016-02-22 NOTE — Telephone Encounter (Signed)
Pt. Called requesting a med refill on Tramadol. Pt. Stated she went to the ED and was prescribed Tramadol but was only giving a few pills. Pt. Was informed that she could not be giving any refill until 02/26/16. Pt. Would still like to speak to the nurse. Please f/u

## 2016-02-23 ENCOUNTER — Encounter (HOSPITAL_COMMUNITY): Payer: Self-pay | Admitting: Emergency Medicine

## 2016-02-23 ENCOUNTER — Emergency Department (INDEPENDENT_AMBULATORY_CARE_PROVIDER_SITE_OTHER)
Admission: EM | Admit: 2016-02-23 | Discharge: 2016-02-23 | Disposition: A | Discharge status: Home / Self Care | Payer: Medicaid Other | Source: Home / Self Care | Attending: Family Medicine | Admitting: Family Medicine

## 2016-02-23 DIAGNOSIS — G8929 Other chronic pain: Secondary | ICD-10-CM

## 2016-02-23 DIAGNOSIS — M79605 Pain in left leg: Secondary | ICD-10-CM

## 2016-02-23 DIAGNOSIS — M79602 Pain in left arm: Secondary | ICD-10-CM

## 2016-02-23 MED ORDER — TRAMADOL HCL 50 MG PO TABS
50.0000 mg | ORAL_TABLET | Freq: Four times a day (QID) | ORAL | Status: DC | PRN
Start: 2016-02-23 — End: 2016-03-31

## 2016-02-23 NOTE — Discharge Instructions (Signed)

## 2016-02-23 NOTE — ED Notes (Signed)
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
## 2016-02-29 ENCOUNTER — Telehealth: Payer: Self-pay | Admitting: Internal Medicine

## 2016-02-29 NOTE — Telephone Encounter (Signed)
Pt. Called requesting a med refill on Tramadol. Please f/u with pt. °

## 2016-03-01 ENCOUNTER — Emergency Department (HOSPITAL_COMMUNITY)
Admission: EM | Admit: 2016-03-01 | Discharge: 2016-03-01 | Disposition: A | Discharge status: Home / Self Care | Payer: Medicaid Other | Source: Home / Self Care | Attending: Family Medicine | Admitting: Family Medicine

## 2016-03-01 ENCOUNTER — Encounter (HOSPITAL_COMMUNITY): Payer: Self-pay | Admitting: *Deleted

## 2016-03-01 ENCOUNTER — Telehealth: Payer: Self-pay | Admitting: *Deleted

## 2016-03-01 DIAGNOSIS — M79602 Pain in left arm: Secondary | ICD-10-CM | POA: Diagnosis not present

## 2016-03-01 DIAGNOSIS — M79605 Pain in left leg: Secondary | ICD-10-CM

## 2016-03-01 DIAGNOSIS — G8929 Other chronic pain: Secondary | ICD-10-CM

## 2016-03-01 NOTE — Telephone Encounter (Signed)
Patient received last refill on 02/23/16. Patient is at the [facility] on 03/01/16  Medical Assistant left message on patient's home and cell voicemail. Voicemail states to give a call back to [place] with [facility] at [phone].

## 2016-03-01 NOTE — Telephone Encounter (Signed)
MA noted on another message patient has been to UC and received refills on this medication on 02/23/16. Patient is currently at the [facility] for same concern.

## 2016-03-01 NOTE — ED Notes (Signed)
Pt  Reports  Pain  l  Leg  Pain        Since    Yesterday       Pt    Reports      Had  A  Stomach  Virus  A  Few  Days   Ago  And  Had  To  Miss  A  Doctors  Appointment because  She  Stated  She  Was  Too  Sick
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
## 2016-03-01 NOTE — Discharge Instructions (Signed)
Urgent Care cannot refill your pain medicine any longer. You must have a pain contract with your doctor

## 2016-03-01 NOTE — Telephone Encounter (Signed)
MA left a VM Patient received a 15 quantity supply on 02/23/16. Patient is currently in the urgent care.

## 2016-03-02 ENCOUNTER — Encounter (HOSPITAL_COMMUNITY): Payer: Self-pay | Admitting: Emergency Medicine

## 2016-03-02 ENCOUNTER — Emergency Department (HOSPITAL_COMMUNITY)
Admission: EM | Admit: 2016-03-02 | Discharge: 2016-03-02 | Disposition: A | Discharge status: Home / Self Care | Payer: Medicaid Other | Attending: Emergency Medicine | Admitting: Emergency Medicine

## 2016-03-02 DIAGNOSIS — Z86018 Personal history of other benign neoplasm: Secondary | ICD-10-CM | POA: Diagnosis not present

## 2016-03-02 DIAGNOSIS — Z791 Long term (current) use of non-steroidal anti-inflammatories (NSAID): Secondary | ICD-10-CM | POA: Insufficient documentation

## 2016-03-02 DIAGNOSIS — Z8719 Personal history of other diseases of the digestive system: Secondary | ICD-10-CM | POA: Diagnosis not present

## 2016-03-02 DIAGNOSIS — G43909 Migraine, unspecified, not intractable, without status migrainosus: Secondary | ICD-10-CM | POA: Diagnosis not present

## 2016-03-02 DIAGNOSIS — M79606 Pain in leg, unspecified: Secondary | ICD-10-CM | POA: Diagnosis present

## 2016-03-02 DIAGNOSIS — Z79899 Other long term (current) drug therapy: Secondary | ICD-10-CM | POA: Diagnosis not present

## 2016-03-02 DIAGNOSIS — Z87891 Personal history of nicotine dependence: Secondary | ICD-10-CM | POA: Insufficient documentation

## 2016-03-02 DIAGNOSIS — G8929 Other chronic pain: Secondary | ICD-10-CM | POA: Insufficient documentation

## 2016-03-02 DIAGNOSIS — M5430 Sciatica, unspecified side: Secondary | ICD-10-CM | POA: Insufficient documentation

## 2016-03-02 DIAGNOSIS — M545 Low back pain: Secondary | ICD-10-CM | POA: Insufficient documentation

## 2016-03-02 MED ORDER — ORPHENADRINE CITRATE ER 100 MG PO TB12: 100.0000 mg | ORAL_TABLET | Freq: Two times a day (BID) | ORAL | Status: DC

## 2016-03-02 MED ORDER — NAPROXEN 500 MG PO TABS: 500.0000 mg | ORAL_TABLET | Freq: Two times a day (BID) | ORAL | Status: DC

## 2016-03-02 MED ORDER — TRAMADOL HCL 50 MG PO TABS
100.0000 mg | ORAL_TABLET | Freq: Once | ORAL | Status: AC
  Administered 2016-03-02: 100 mg via ORAL
  Filled 2016-03-02: qty 2

## 2016-03-02 MED ORDER — IBUPROFEN 800 MG PO TABS
800.0000 mg | ORAL_TABLET | Freq: Once | ORAL | Status: AC
  Administered 2016-03-02: 800 mg via ORAL
  Filled 2016-03-02: qty 1

## 2016-03-02 MED ORDER — TRAMADOL HCL 50 MG PO TABS: 100.0000 mg | ORAL_TABLET | Freq: Four times a day (QID) | ORAL | Status: DC | PRN

## 2016-03-02 NOTE — Discharge Instructions (Signed)
Sciatica Your pain has become chronic. This needs to be treated by your family doctor, an orthopedic doctor and/or pain management specialist. Make a follow up appointment with orthopedics as listed above and your family doctor to discuss any needed further testing, physical therapy and pain management. The Emergency Department cannot manage chronic pain. Sciatica is pain, weakness, numbness, or tingling along the path of the sciatic nerve. The nerve starts in the lower back and runs down the back of each leg. The nerve controls the muscles in the lower leg and in the back of the knee, while also providing sensation to the back of the thigh, lower leg, and the sole of your foot. Sciatica is a symptom of another medical condition. For instance, nerve damage or certain conditions, such as a herniated disk or bone spur on the spine, pinch or put pressure on the sciatic nerve. This causes the pain, weakness, or other sensations normally associated with sciatica. Generally, sciatica only affects one side of the body. CAUSES   Herniated or slipped disc.  Degenerative disk disease.  A pain disorder involving the narrow muscle in the buttocks (piriformis syndrome).  Pelvic injury or fracture.  Pregnancy.  Tumor (rare). SYMPTOMS  Symptoms can vary from mild to very severe. The symptoms usually travel from the low back to the buttocks and down the back of the leg. Symptoms can include:  Mild tingling or dull aches in the lower back, leg, or hip.  Numbness in the back of the calf or sole of the foot.  Burning sensations in the lower back, leg, or hip.  Sharp pains in the lower back, leg, or hip.  Leg weakness.  Severe back pain inhibiting movement. These symptoms may get worse with coughing, sneezing, laughing, or prolonged sitting or standing. Also, being overweight may worsen symptoms. DIAGNOSIS  Your caregiver will perform a physical exam to look for common symptoms of sciatica. He or she may  ask you to do certain movements or activities that would trigger sciatic nerve pain. Other tests may be performed to find the cause of the sciatica. These may include:  Blood tests.  X-rays.  Imaging tests, such as an MRI or CT scan. TREATMENT  Treatment is directed at the cause of the sciatic pain. Sometimes, treatment is not necessary and the pain and discomfort goes away on its own. If treatment is needed, your caregiver may suggest:  Over-the-counter medicines to relieve pain.  Prescription medicines, such as anti-inflammatory medicine, muscle relaxants, or narcotics.  Applying heat or ice to the painful area.  Steroid injections to lessen pain, irritation, and inflammation around the nerve.  Reducing activity during periods of pain.  Exercising and stretching to strengthen your abdomen and improve flexibility of your spine. Your caregiver may suggest losing weight if the extra weight makes the back pain worse.  Physical therapy.  Surgery to eliminate what is pressing or pinching the nerve, such as a bone spur or part of a herniated disk. HOME CARE INSTRUCTIONS   Only take over-the-counter or prescription medicines for pain or discomfort as directed by your caregiver.  Apply ice to the affected area for 20 minutes, 3-4 times a day for the first 48-72 hours. Then try heat in the same way.  Exercise, stretch, or perform your usual activities if these do not aggravate your pain.  Attend physical therapy sessions as directed by your caregiver.  Keep all follow-up appointments as directed by your caregiver.  Do not wear high heels or shoes  that do not provide proper support.  Check your mattress to see if it is too soft. A firm mattress may lessen your pain and discomfort. SEEK IMMEDIATE MEDICAL CARE IF:   You lose control of your bowel or bladder (incontinence).  You have increasing weakness in the lower back, pelvis, buttocks, or legs.  You have redness or swelling of  your back.  You have a burning sensation when you urinate.  You have pain that gets worse when you lie down or awakens you at night.  Your pain is worse than you have experienced in the past.  Your pain is lasting longer than 4 weeks.  You are suddenly losing weight without reason. MAKE SURE YOU:  Understand these instructions.  Will watch your condition.  Will get help right away if you are not doing well or get worse.   This information is not intended to replace advice given to you by your health care provider. Make sure you discuss any questions you have with your health care provider.   Document Released: 11/15/2001 Document Revised: 08/12/2015 Document Reviewed: 04/01/2012 Elsevier Interactive Patient Education [employer].

## 2016-03-02 NOTE — ED Provider Notes (Signed)
CSN: [id number]     Arrival date & time 03/02/16  0227 History   First MD Initiated Contact with Patient 03/02/16 [phone]     Chief Complaint  Patient presents with  . Leg Pain     (Consider location/radiation/quality/duration/timing/severity/associated sxs/prior Treatment) HPI Patient has had the same pain for years. She reports that she was seen by her doctor yesterday but she was not given any prescriptions. Pain is in her left lower back. She indicates her sciatic region. It radiates down her buttock and the back of her leg. She reports as a sharp shooting pain. Worse with twisting or bending. No weakness numbness or tingling. She reports she's been seen by orthopedics before but they told her she had a damage nerve and is nothing they could do.  Past Medical History  Diagnosis Date  . Migraine   . Leg pain   . Fibroids   . History of hiatal hernia    Past Surgical History  Procedure Laterality Date  . Tubal ligation    . Wisdom tooth extraction    . Bilateral salpingectomy Bilateral 09/29/2015    Procedure: BILATERAL SALPINGECTOMY;  Surgeon: Mora Bellman, MD;  Location: [facility];  Service: Gynecology;  Laterality: Bilateral;  . Abdominal hysterectomy  09/29/2015    Procedure: HYSTERECTOMY ABDOMINAL;  Surgeon: Mora Bellman, MD;  Location: [facility];  Service: Gynecology;;   Family History  Problem Relation Age of Onset  . Diabetes Sister   . Heart disease Paternal Grandmother   . Hypertension Father   . Dementia Mother   . Hypertension Mother    Social History  Substance Use Topics  . Smoking status: Former Research scientist (life sciences)  . Smokeless tobacco: Never Used  . Alcohol Use: No   OB History    Gravida Para Term Preterm AB TAB SAB Ectopic Multiple Living   2 2 2  0 0 0 0 0 0 2     Review of Systems Constitutional: No recent fever chills GI: No abdominal pain nausea vomiting or diarrhea GU: No difficulty urinating, pain or burning.   Allergies  Review of patient's allergies  indicates no known allergies.  Home Medications   Prior to Admission medications   Medication Sig Start Date End Date Taking? Authorizing Provider  Aspirin-Salicylamide-Caffeine (BC HEADACHE POWDER PO) Take 1 Package by mouth daily as needed (pain).    Historical Provider, MD  cyclobenzaprine (FLEXERIL) 10 MG tablet Take 1 tablet (10 mg total) by mouth 3 (three) times daily as needed for muscle spasms. Patient not taking: Reported on 02/03/2016 123XX123   Delora Fuel, MD  diclofenac sodium (VOLTAREN) 1 % GEL Apply 4 g topically 4 (four) times daily. Patient not taking: Reported on 02/03/2016 12/31/15   Tresa Garter, MD  ibuprofen (ADVIL,MOTRIN) 200 MG tablet Take 200 mg by mouth every 6 (six) hours as needed for headache, mild pain or moderate pain.    Historical Provider, MD  naproxen (NAPROSYN) 375 MG tablet Take 1 tablet (375 mg total) by mouth 2 (two) times daily with a meal. 02/03/16   Varney Biles, MD  naproxen (NAPROSYN) 375 MG tablet Take 1 tablet (375 mg total) by mouth 2 (two) times daily. 02/03/16   Varney Biles, MD  naproxen (NAPROSYN) 500 MG tablet Take 1 tablet (500 mg total) by mouth 2 (two) times daily. 03/02/16   Charlesetta Shanks, MD  orphenadrine (NORFLEX) 100 MG tablet Take 1 tablet (100 mg total) by mouth 2 (two) times daily. 03/02/16   Charlesetta Shanks, MD  oxyCODONE-acetaminophen (PERCOCET) 5-325 MG tablet Take 1 tablet by mouth every 4 (four) hours as needed for moderate pain. Patient not taking: Reported on 02/03/2016 123XX123   Delora Fuel, MD  traMADol (ULTRAM) 50 MG tablet Take 1 tablet (50 mg total) by mouth every 6 (six) hours as needed. 02/03/16   Varney Biles, MD  traMADol (ULTRAM) 50 MG tablet Take 1 tablet (50 mg total) by mouth every 6 (six) hours as needed. 02/23/16   Konrad Felix, PA  traMADol (ULTRAM) 50 MG tablet Take 2 tablets (100 mg total) by mouth every 6 (six) hours as needed. 03/02/16   Charlesetta Shanks, MD   BP 108/86 mmHg  Pulse 82  Temp(Src) 97.7 F (36.5  C) (Oral)  Resp 16  SpO2 98%  LMP 08/15/2015 (Approximate) Physical Exam  Constitutional: She is oriented to person, place, and time. She appears well-developed and well-nourished. No distress.  HENT:  Head: Normocephalic and atraumatic.  Eyes: EOM are normal.  Cardiovascular: Normal rate, regular rhythm, normal heart sounds and intact distal pulses.   Pulmonary/Chest: Effort normal and breath sounds normal.  Abdominal: Soft. She exhibits no distension and no mass. There is no tenderness. There is no rebound and no guarding.  Musculoskeletal: She exhibits no edema.  Patient endorses tenderness to palpation over the left SI joint. Skin and bony prominences of the back are normal. Patient is able to stand and ambulate. Positive pain with forward flexion. Lower strength strength testing intact to flexion and extension. Sensation intact.  Neurological: She is alert and oriented to person, place, and time. Coordination normal.  Skin: Skin is warm and dry.  Psychiatric: She has a normal mood and affect.    ED Course  Procedures (including critical care time) Labs Review Labs Reviewed - No data to display  Imaging Review No results found. I have personally reviewed and evaluated these images and lab results as part of my medical decision-making.   EKG Interpretation None      MDM   Final diagnoses:  Sciatic leg pain   Chronic pain without indication of acute complication. Patient is counseled on the necessity of follow-up. She is counseled follow-up with orthopedics and her family physician to discuss any further diagnostic needs versus referral to pain management or physical therapy.    Charlesetta Shanks, MD 03/02/16 [phone]

## 2016-03-02 NOTE — ED Notes (Signed)
Pt states that she has had pain in her L leg that starts in her lower back and radiates down. No sleep for two days because of the pain. Alert and oriented.

## 2016-03-10 ENCOUNTER — Ambulatory Visit: Payer: Medicaid Other | Admitting: Internal Medicine

## 2016-03-15 ENCOUNTER — Other Ambulatory Visit: Payer: Self-pay | Admitting: *Deleted

## 2016-03-15 ENCOUNTER — Telehealth: Payer: Self-pay | Admitting: Internal Medicine

## 2016-03-15 DIAGNOSIS — M25552 Pain in left hip: Secondary | ICD-10-CM

## 2016-03-15 NOTE — Telephone Encounter (Signed)
MA spoke with patient and made her aware of referral to pain clinic being placed due to patients frequent request from different facilities for pain medication.

## 2016-03-15 NOTE — Telephone Encounter (Signed)
Patient verified DOB. Patient made aware of [facility] refuse to provide patient with pain medication due to her frequent use and different facilities. 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 Patient was referred to sports medicine and per referral notes facilities have not been able to reach the patient via contacts listed in [place]. Patient aware of being referred to pain management. No further questions at this time.

## 2016-03-15 NOTE — Telephone Encounter (Signed)
Patient called requesting a refill for Tramadol. Please follow up.

## 2016-03-31 ENCOUNTER — Encounter: Payer: Self-pay | Admitting: Clinical

## 2016-03-31 ENCOUNTER — Encounter: Payer: Self-pay | Admitting: Internal Medicine

## 2016-03-31 ENCOUNTER — Ambulatory Visit: Payer: Medicaid Other | Attending: Internal Medicine | Admitting: Internal Medicine

## 2016-03-31 VITALS — BP 91/62 | HR 103 | Temp 97.6°F | Resp 18 | Ht 62.0 in | Wt 98.4 lb

## 2016-03-31 DIAGNOSIS — M25552 Pain in left hip: Secondary | ICD-10-CM | POA: Diagnosis not present

## 2016-03-31 DIAGNOSIS — Z79899 Other long term (current) drug therapy: Secondary | ICD-10-CM | POA: Insufficient documentation

## 2016-03-31 DIAGNOSIS — G894 Chronic pain syndrome: Secondary | ICD-10-CM | POA: Diagnosis not present

## 2016-03-31 DIAGNOSIS — Z7982 Long term (current) use of aspirin: Secondary | ICD-10-CM | POA: Insufficient documentation

## 2016-03-31 MED ORDER — TRAMADOL HCL 50 MG PO TABS: 50.0000 mg | ORAL_TABLET | Freq: Four times a day (QID) | ORAL | Status: DC | PRN

## 2016-03-31 MED FILL — traMADol HCL 50 MG TABS: 50 | 20 days supply | Qty: 60 | Fill #0
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
## 2016-03-31 NOTE — Progress Notes (Signed)
Patient is here for Leg Pain  Patient complains of chronic left leg pain. Pain is scaled currently at a 10. Patient took naprosyn for pain with minimal relief.  Patient has eaten today.  Patient is requesting a refill on Tramadol and Pain clinic referral.

## 2016-03-31 NOTE — Progress Notes (Signed)
Depression screen [facility] 2/9 03/31/2016 12/31/2015 10/24/2013  Decreased Interest 0 0 0  Down, Depressed, Hopeless 0 0 0  PHQ - 2 Score 0 0 0    GAD 7 : Generalized Anxiety Score 03/31/2016  Nervous, Anxious, on Edge 0  Control/stop worrying 1  Worry too much - different things 1  Trouble relaxing 2  Restless 0  Easily annoyed or irritable 0  Afraid - awful might happen 0  Total GAD 7 Score 4

## 2016-04-22 ENCOUNTER — Telehealth: Payer: Self-pay | Admitting: Internal Medicine

## 2016-04-22 DIAGNOSIS — M25552 Pain in left hip: Secondary | ICD-10-CM

## 2016-04-22 NOTE — Telephone Encounter (Signed)
Sent Referral to [facility]

## 2016-04-22 NOTE — Telephone Encounter (Signed)
Patient needs a referral to pain management Patient also needs tramadol

## 2016-04-23 ENCOUNTER — Emergency Department (HOSPITAL_COMMUNITY)
Admission: EM | Admit: 2016-04-23 | Discharge: 2016-04-23 | Disposition: A | Discharge status: Home / Self Care | Payer: Medicaid Other | Attending: Emergency Medicine | Admitting: Emergency Medicine

## 2016-04-23 ENCOUNTER — Encounter (HOSPITAL_COMMUNITY): Payer: Self-pay | Admitting: Emergency Medicine

## 2016-04-23 DIAGNOSIS — Z791 Long term (current) use of non-steroidal anti-inflammatories (NSAID): Secondary | ICD-10-CM | POA: Insufficient documentation

## 2016-04-23 DIAGNOSIS — Z79899 Other long term (current) drug therapy: Secondary | ICD-10-CM | POA: Diagnosis not present

## 2016-04-23 DIAGNOSIS — W2203XA Walked into furniture, initial encounter: Secondary | ICD-10-CM | POA: Diagnosis not present

## 2016-04-23 DIAGNOSIS — Y998 Other external cause status: Secondary | ICD-10-CM | POA: Diagnosis not present

## 2016-04-23 DIAGNOSIS — Z87891 Personal history of nicotine dependence: Secondary | ICD-10-CM | POA: Insufficient documentation

## 2016-04-23 DIAGNOSIS — Y9389 Activity, other specified: Secondary | ICD-10-CM | POA: Insufficient documentation

## 2016-04-23 DIAGNOSIS — G8929 Other chronic pain: Secondary | ICD-10-CM | POA: Diagnosis not present

## 2016-04-23 DIAGNOSIS — M79605 Pain in left leg: Secondary | ICD-10-CM

## 2016-04-23 DIAGNOSIS — G43909 Migraine, unspecified, not intractable, without status migrainosus: Secondary | ICD-10-CM | POA: Diagnosis not present

## 2016-04-23 DIAGNOSIS — S8992XA Unspecified injury of left lower leg, initial encounter: Secondary | ICD-10-CM | POA: Insufficient documentation

## 2016-04-23 DIAGNOSIS — Z86018 Personal history of other benign neoplasm: Secondary | ICD-10-CM | POA: Diagnosis not present

## 2016-04-23 DIAGNOSIS — Z8719 Personal history of other diseases of the digestive system: Secondary | ICD-10-CM | POA: Insufficient documentation

## 2016-04-23 DIAGNOSIS — Y9289 Other specified places as the place of occurrence of the external cause: Secondary | ICD-10-CM | POA: Insufficient documentation

## 2016-04-23 DIAGNOSIS — M79602 Pain in left arm: Secondary | ICD-10-CM

## 2016-04-23 MED ORDER — TRAMADOL HCL 50 MG PO TABS
100.0000 mg | ORAL_TABLET | Freq: Once | ORAL | Status: AC
  Administered 2016-04-23: 100 mg via ORAL
  Filled 2016-04-23: qty 2

## 2016-04-23 MED ORDER — TRAMADOL HCL 50 MG PO TABS: 50.0000 mg | ORAL_TABLET | Freq: Four times a day (QID) | ORAL | Status: DC | PRN
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
## 2016-04-23 NOTE — Discharge Instructions (Signed)
Please read and follow all provided instructions.  Your diagnoses today include:  1. Chronic leg pain, left    Tests performed today include:  Vital signs - see below for your results today  Medications prescribed:   Tramadol - narcotic-like pain medication  DO NOT drive or perform any activities that require you to be awake and alert because this medicine can make you drowsy.   Take any prescribed medications only as directed.  Home care instructions:   Follow any educational materials contained in this packet  Please rest, use ice or heat on your back for the next several days  Do not lift, push, pull anything more than 10 pounds for the next week  Follow-up instructions: Please follow-up with your primary care provider in the next 1 week for further evaluation of your symptoms.   Return instructions:  SEEK IMMEDIATE MEDICAL ATTENTION IF YOU HAVE:  New numbness, tingling, weakness, or problem with the use of your arms or legs  Severe back pain not relieved with medications  Loss control of your bowels or bladder  Increasing pain in any areas of the body (such as chest or abdominal pain)  Shortness of breath, dizziness, or fainting.   Worsening nausea (feeling sick to your stomach), vomiting, fever, or sweats  Any other emergent concerns regarding your health   Additional Information:  Your vital signs today were: BP 114/79 mmHg   Pulse 99   Temp(Src) 98.6 F (37 C) (Oral)   Resp 16   Ht 5\' 3"  (1.6 m)   Wt 44.453 kg   BMI 17.36 kg/m2   SpO2 100%   LMP 08/15/2015 (Approximate) If your blood pressure (BP) was elevated above 135/85 this visit, please have this repeated by your doctor within one month. --------------

## 2016-04-23 NOTE — ED Notes (Signed)
Pt given a bus pass to get home

## 2016-04-23 NOTE — ED Notes (Signed)
Pt. reports left leg pain onset 12 midnight today , denies injury/ambulatory .

## 2016-04-23 NOTE — ED Notes (Signed)
Pt requested a bus pass to get home

## 2016-04-27 NOTE — Telephone Encounter (Signed)
Pt. Called requesting a refill on Tramadol. Pt stated she only has one pill left. Please f/u with pt.

## 2016-04-29 NOTE — Telephone Encounter (Signed)
Pt. Called requesting a refill on Tramadol. Pt stated she only has one pill left. Please f/u with pt.

## 2016-05-03 NOTE — Telephone Encounter (Signed)
Patient called requesting medication refill on Tramadol, please f/up patient states she is completely out of medication

## 2016-05-04 ENCOUNTER — Other Ambulatory Visit: Payer: Self-pay | Admitting: *Deleted

## 2016-05-04 NOTE — Telephone Encounter (Signed)
Patient was last seen in the office on 03/23/16. Patient received Tramadol refill and referral to Pain clinic on this day. Patient was seen in the ED on 04/23/16 and received Tramadol. Patient is now requesting a refill. Patients referral has been authorized and patient should have an appointment scheduled.  !!!MA unable to contact the patient or leave a VM. Patient may receive one refill to bridge her until the pain clinic appointment on 06/02/16 at 11:00am. If patient does not go to the pain clinic as scheduled. She will need to complete a [facility] pain contract and not be able to go to the pain clinic!!!

## 2016-05-05 ENCOUNTER — Other Ambulatory Visit: Payer: Self-pay | Admitting: *Deleted

## 2016-05-05 MED ORDER — TRAMADOL HCL 50 MG PO TABS: 50.0000 mg | ORAL_TABLET | Freq: Four times a day (QID) | ORAL | Status: DC | PRN

## 2016-05-05 MED FILL — traMADol HCL 50 MG TABS: 50 | 7 days supply | Qty: 30 | Fill #0

## 2016-05-05 NOTE — Telephone Encounter (Signed)
Patient has requested refill on Tramadol. Patient is aware of Pain clinic appointment being 06/02/16 and this being the last refill patient will receive here at [facility]. Patient expressed her understanding and states she will be present at the appointment.

## 2016-05-13 ENCOUNTER — Encounter (HOSPITAL_COMMUNITY): Payer: Self-pay | Admitting: Emergency Medicine

## 2016-05-13 ENCOUNTER — Emergency Department (HOSPITAL_COMMUNITY)
Admission: EM | Admit: 2016-05-13 | Discharge: 2016-05-13 | Disposition: A | Discharge status: Home / Self Care | Payer: Medicaid Other | Attending: Emergency Medicine | Admitting: Emergency Medicine

## 2016-05-13 DIAGNOSIS — Z79899 Other long term (current) drug therapy: Secondary | ICD-10-CM | POA: Insufficient documentation

## 2016-05-13 DIAGNOSIS — Z87891 Personal history of nicotine dependence: Secondary | ICD-10-CM | POA: Insufficient documentation

## 2016-05-13 DIAGNOSIS — Z791 Long term (current) use of non-steroidal anti-inflammatories (NSAID): Secondary | ICD-10-CM | POA: Diagnosis not present

## 2016-05-13 DIAGNOSIS — Z86018 Personal history of other benign neoplasm: Secondary | ICD-10-CM | POA: Diagnosis not present

## 2016-05-13 DIAGNOSIS — G43909 Migraine, unspecified, not intractable, without status migrainosus: Secondary | ICD-10-CM | POA: Insufficient documentation

## 2016-05-13 DIAGNOSIS — M79605 Pain in left leg: Secondary | ICD-10-CM | POA: Insufficient documentation

## 2016-05-13 DIAGNOSIS — Z8719 Personal history of other diseases of the digestive system: Secondary | ICD-10-CM | POA: Insufficient documentation

## 2016-05-13 DIAGNOSIS — G8929 Other chronic pain: Secondary | ICD-10-CM | POA: Insufficient documentation

## 2016-05-13 MED ORDER — TRAMADOL HCL 50 MG PO TABS
50.0000 mg | ORAL_TABLET | Freq: Once | ORAL | Status: AC
  Administered 2016-05-13: 50 mg via ORAL
  Filled 2016-05-13: qty 1

## 2016-05-13 MED ORDER — TRAMADOL HCL 50 MG PO TABS: 50.0000 mg | ORAL_TABLET | Freq: Four times a day (QID) | ORAL | Status: DC | PRN
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
## 2016-05-13 NOTE — Discharge Instructions (Signed)
Follow up with your primary care provider or pain management as needed.  Return to ER for new or worsening symptoms, any additional concerns.

## 2016-05-13 NOTE — ED Notes (Addendum)
Patient having left leg pain, having trouble walking on the leg.  Patient states that she has been having trouble for the last few days.  No shortness of breath, no chest pain, no history of DVT.  Patient states that it is the left upper leg. No injury, no swelling noted.

## 2016-05-16 ENCOUNTER — Emergency Department (HOSPITAL_COMMUNITY)
Admission: EM | Admit: 2016-05-16 | Discharge: 2016-05-16 | Disposition: A | Discharge status: Home / Self Care | Payer: Medicaid Other | Attending: Emergency Medicine | Admitting: Emergency Medicine

## 2016-05-16 ENCOUNTER — Encounter (HOSPITAL_COMMUNITY): Payer: Self-pay | Admitting: Emergency Medicine

## 2016-05-16 DIAGNOSIS — G8929 Other chronic pain: Secondary | ICD-10-CM | POA: Diagnosis not present

## 2016-05-16 DIAGNOSIS — M545 Low back pain: Secondary | ICD-10-CM | POA: Insufficient documentation

## 2016-05-16 DIAGNOSIS — Z87891 Personal history of nicotine dependence: Secondary | ICD-10-CM | POA: Diagnosis not present

## 2016-05-16 DIAGNOSIS — M549 Dorsalgia, unspecified: Secondary | ICD-10-CM

## 2016-05-16 NOTE — ED Notes (Signed)
PA-C stated "she walked out after I d/c'd her.  She didn't want to wait on her paperwork."

## 2016-05-16 NOTE — ED Notes (Signed)
Pt states that she has had lower back pain radiating down her leg x 1 day. States that she has been dx with sciatica problems in the past. Alert and oriented.
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
## 2016-05-18 NOTE — Telephone Encounter (Signed)
Patient is needing tramadol °

## 2016-05-19 MED ORDER — TRAMADOL HCL 50 MG PO TABS: 50.0000 mg | ORAL_TABLET | Freq: Three times a day (TID) | ORAL | Status: DC | PRN

## 2016-05-19 NOTE — Telephone Encounter (Signed)
Pt. Called requesting a refill on Tramadol. Pt. Stated that she was hoping that her PCP Could prescribe her enough medication until she goes to pain management. She stated  She was only giving 30 pills and it was not enough. Pt. Has tried exercising and it is not  Helping her. Please f/u with pt.

## 2016-05-19 NOTE — Telephone Encounter (Signed)
Patient verified DOB Patient states she has used all of the Tramadol which was given to her by the ED and [facility]. Patient is aware of prescription only being enough to bridge her until the appointment on 06/02/16. Patient is aware of this being the last prescription filled here once she signs the pain contract with the pain clinic. No further questions at this time.

## 2016-06-13 ENCOUNTER — Encounter (HOSPITAL_COMMUNITY): Payer: Self-pay | Admitting: Emergency Medicine

## 2016-06-13 ENCOUNTER — Emergency Department (HOSPITAL_COMMUNITY)
Admission: EM | Admit: 2016-06-13 | Discharge: 2016-06-13 | Disposition: A | Discharge status: Home / Self Care | Payer: Medicaid Other | Attending: Emergency Medicine | Admitting: Emergency Medicine

## 2016-06-13 DIAGNOSIS — M5432 Sciatica, left side: Secondary | ICD-10-CM

## 2016-06-13 DIAGNOSIS — M5430 Sciatica, unspecified side: Secondary | ICD-10-CM | POA: Insufficient documentation

## 2016-06-13 DIAGNOSIS — G8929 Other chronic pain: Secondary | ICD-10-CM | POA: Diagnosis not present

## 2016-06-13 DIAGNOSIS — Z7982 Long term (current) use of aspirin: Secondary | ICD-10-CM | POA: Diagnosis not present

## 2016-06-13 DIAGNOSIS — Z87891 Personal history of nicotine dependence: Secondary | ICD-10-CM | POA: Diagnosis not present

## 2016-06-13 DIAGNOSIS — M549 Dorsalgia, unspecified: Secondary | ICD-10-CM | POA: Insufficient documentation

## 2016-06-13 MED ORDER — TRAMADOL HCL 50 MG PO TABS: 50.0000 mg | ORAL_TABLET | Freq: Three times a day (TID) | ORAL | Status: DC | PRN

## 2016-06-13 MED ORDER — TRAMADOL HCL 50 MG PO TABS
50.0000 mg | ORAL_TABLET | Freq: Once | ORAL | Status: AC
  Administered 2016-06-13: 50 mg via ORAL
  Filled 2016-06-13: qty 1

## 2016-06-13 NOTE — ED Notes (Signed)
Pt. Stated, I started having pain in my left leg yesterday. No injury

## 2016-06-13 NOTE — ED Provider Notes (Signed)
CSN: [id number]     Arrival date & time 06/13/16  1252 History   First MD Initiated Contact with Patient 06/13/16 1712     Chief Complaint  Patient presents with  . Leg Pain     (Consider location/radiation/quality/duration/timing/severity/associated sxs/prior Treatment) HPI  Brianna Velez is a(n) 40 y.o. female who presents with cc L sciatica. Hx chronic bp. She has run out of tramadol. Patient was seen for the same on 05/16/2016. Patient was referred to a pain management center in [place] [facility] However, this is a mistake as she stays in [place]. She has a new appointment at the end of the month with pain management center here in [place]. She states tramadol, especially well for managing her pain.Denies weakness, loss of bowel/bladder function or saddle anesthesia. Denies neck stiffness, headache, rash.  Denies fever or recent procedures to back.   Past Medical History  Diagnosis Date  . Migraine   . Leg pain   . Fibroids   . History of hiatal hernia    Past Surgical History  Procedure Laterality Date  . Tubal ligation    . Wisdom tooth extraction    . Bilateral salpingectomy Bilateral 09/29/2015    Procedure: BILATERAL SALPINGECTOMY;  Surgeon: Mora Bellman, MD;  Location: [facility];  Service: Gynecology;  Laterality: Bilateral;  . Abdominal hysterectomy  09/29/2015    Procedure: HYSTERECTOMY ABDOMINAL;  Surgeon: Mora Bellman, MD;  Location: [facility];  Service: Gynecology;;   Family History  Problem Relation Age of Onset  . Diabetes Sister   . Heart disease Paternal Grandmother   . Hypertension Father   . Dementia Mother   . Hypertension Mother    Social History  Substance Use Topics  . Smoking status: Former Research scientist (life sciences)  . Smokeless tobacco: Never Used  . Alcohol Use: No   OB History    Gravida Para Term Preterm AB TAB SAB Ectopic Multiple Living   2 2 2  0 0 0 0 0 0 2     Review of Systems  Ten systems reviewed and are  negative for acute change, except as noted in the HPI.    Allergies  Review of patient's allergies indicates no known allergies.  Home Medications   Prior to Admission medications   Medication Sig Start Date End Date Taking? Authorizing Provider  Aspirin-Salicylamide-Caffeine (BC HEADACHE POWDER PO) Take 1 Package by mouth daily as needed (pain).    Historical Provider, MD  cyclobenzaprine (FLEXERIL) 10 MG tablet Take 1 tablet (10 mg total) by mouth 3 (three) times daily as needed for muscle spasms. 123XX123   Delora Fuel, MD  diclofenac sodium (VOLTAREN) 1 % GEL Apply 4 g topically 4 (four) times daily. 12/31/15   Tresa Garter, MD  naproxen (NAPROSYN) 500 MG tablet Take 1 tablet (500 mg total) by mouth 2 (two) times daily. 03/02/16   Charlesetta Shanks, MD  orphenadrine (NORFLEX) 100 MG tablet Take 1 tablet (100 mg total) by mouth 2 (two) times daily. 03/02/16   Charlesetta Shanks, MD  traMADol (ULTRAM) 50 MG tablet Take 1 tablet (50 mg total) by mouth every 8 (eight) hours as needed. 05/19/16   Tresa Garter, MD   BP 102/65 mmHg  Temp(Src) 98 F (36.7 C) (Oral)  Resp 16  Ht 5\' 4"  (1.626 m)  Wt 44.453 kg  BMI 16.81 kg/m2  SpO2 100%  LMP 08/15/2015 (Approximate) Physical Exam Physical Exam  Nursing note and vitals reviewed. Constitutional: She is oriented to  person, place, and time. She appears well-developed and well-nourished. No distress.  HENT:  Head: Normocephalic and atraumatic.  Eyes: Conjunctivae normal and EOM are normal. Pupils are equal, round, and reactive to light. No scleral icterus.  Neck: Normal range of motion.  Cardiovascular: Normal rate, regular rhythm and normal heart sounds.  Exam reveals no gallop and no friction rub.   No murmur heard. Pulmonary/Chest: Effort normal and breath sounds normal. No respiratory distress.  Abdominal: Soft. Bowel sounds are normal. She exhibits no distension and no mass. There is no tenderness. There is no guarding.   Musculoskeletal: Tender to palpation left lumbar paraspinals and left gluteus. Positive straight leg raise, normal DTRs and strength  Neurological: She is alert and oriented to person, place, and time.  Skin: Skin is warm and dry. She is not diaphoretic.    ED Course  Procedures (including critical care time) Labs Review Labs Reviewed - No data to display  Imaging Review No results found. I have personally reviewed and evaluated these images and lab results as part of my medical decision-making.   EKG Interpretation None      MDM   Final diagnoses:  Sciatica of left side  Chronic back pain     Last tramadol on 6/16- 20 day supply [facility]. Patient with back pain.  No neurological deficits and normal neuro exam.  Patient can walk but states is painful.  No loss of bowel or bladder control.  No concern for cauda equina.  No fever, night sweats, weight loss, h/o cancer, IVDU.  RICE protocol and pain medicine indicated and discussed with patient.      Margarita Mail, PA-C 06/14/16 0148  Davonna Belling, MD 06/15/16 [phone]

## 2016-06-13 NOTE — Discharge Instructions (Signed)
Back Pain, Adult °Back pain is very common in adults. The cause of back pain is rarely dangerous and the pain often gets better over time. The cause of your back pain may not be known. Some common causes of back pain include: °· Strain of the muscles or ligaments supporting the spine. °· Wear and tear (degeneration) of the spinal disks. °· Arthritis. °· Direct injury to the back. °For many people, back pain may return. Since back pain is rarely dangerous, most people can learn to manage this condition on their own. °HOME CARE INSTRUCTIONS °Watch your back pain for any changes. The following actions may help to lessen any discomfort you are feeling: °· Remain active. It is stressful on your back to sit or stand in one place for long periods of time. Do not sit, drive, or stand in one place for more than 30 minutes at a time. Take short walks on even surfaces as soon as you are able. Try to increase the length of time you walk each day. °· Exercise regularly as directed by your health care provider. Exercise helps your back heal faster. It also helps avoid future injury by keeping your muscles strong and flexible. °· Do not stay in bed. Resting more than 1-2 days can delay your recovery. °· Pay attention to your body when you bend and lift. The most comfortable positions are those that put less stress on your recovering back. Always use proper lifting techniques, including: °· Bending your knees. °· Keeping the load close to your body. °· Avoiding twisting. °· Find a comfortable position to sleep. Use a firm mattress and lie on your side with your knees slightly bent. If you lie on your back, put a pillow under your knees. °· Avoid feeling anxious or stressed. Stress increases muscle tension and can worsen back pain. It is important to recognize when you are anxious or stressed and learn ways to manage it, such as with exercise. °· Take medicines only as directed by your health care provider. Over-the-counter  medicines to reduce pain and inflammation are often the most helpful. Your health care provider may prescribe muscle relaxant drugs. These medicines help dull your pain so you can more quickly return to your normal activities and healthy exercise. °· Apply ice to the injured area: °· Put ice in a plastic bag. °· Place a towel between your skin and the bag. °· Leave the ice on for 20 minutes, 2-3 times a day for the first 2-3 days. After that, ice and heat may be alternated to reduce pain and spasms. °· Maintain a healthy weight. Excess weight puts extra stress on your back and makes it difficult to maintain good posture. °SEEK MEDICAL CARE IF: °· You have pain that is not relieved with rest or medicine. °· You have increasing pain going down into the legs or buttocks. °· You have pain that does not improve in one week. °· You have night pain. °· You lose weight. °· You have a fever or chills. °SEEK IMMEDIATE MEDICAL CARE IF:  °· You develop new bowel or bladder control problems. °· You have unusual weakness or numbness in your arms or legs. °· You develop nausea or vomiting. °· You develop abdominal pain. °· You feel faint. °  °This information is not intended to replace advice given to you by your health care provider. Make sure you discuss any questions you have with your health care provider. °  °Document Released: 11/21/2005 Document Revised: 12/12/2014 Document Reviewed: 03/25/2014 °Elsevier Interactive Patient Education ©2016 Elsevier   Inc.  Chronic Back Pain  When back pain lasts longer than 3 months, it is called chronic back pain.People with chronic back pain often go through certain periods that are more intense (flare-ups).  CAUSES Chronic back pain can be caused by wear and tear (degeneration) on different structures in your back. These structures include:  The bones of your spine (vertebrae) and the joints surrounding your spinal cord and nerve roots (facets).  The strong, fibrous tissues that  connect your vertebrae (ligaments). Degeneration of these structures may result in pressure on your nerves. This can lead to constant pain. HOME CARE INSTRUCTIONS  Avoid bending, heavy lifting, prolonged sitting, and activities which make the problem worse.  Take brief periods of rest throughout the day to reduce your pain. Lying down or standing usually is better than sitting while you are resting.  Take over-the-counter or prescription medicines only as directed by your caregiver. SEEK IMMEDIATE MEDICAL CARE IF:   You have weakness or numbness in one of your legs or feet.  You have trouble controlling your bladder or bowels.  You have nausea, vomiting, abdominal pain, shortness of breath, or fainting.   This information is not intended to replace advice given to you by your health care provider. Make sure you discuss any questions you have with your health care provider.   Document Released: 12/29/2004 Document Revised: 02/13/2012 Document Reviewed: 05/11/2015 Elsevier Interactive Patient Education [employer].

## 2016-06-21 ENCOUNTER — Ambulatory Visit: Payer: Medicaid Other | Admitting: Pain Medicine

## 2016-06-23 ENCOUNTER — Encounter (HOSPITAL_COMMUNITY): Payer: Self-pay | Admitting: *Deleted

## 2016-06-23 ENCOUNTER — Emergency Department (HOSPITAL_COMMUNITY)
Admission: EM | Admit: 2016-06-23 | Discharge: 2016-06-23 | Disposition: A | Discharge status: Home / Self Care | Payer: Medicaid Other | Attending: Emergency Medicine | Admitting: Emergency Medicine

## 2016-06-23 DIAGNOSIS — Z7982 Long term (current) use of aspirin: Secondary | ICD-10-CM | POA: Insufficient documentation

## 2016-06-23 DIAGNOSIS — Z791 Long term (current) use of non-steroidal anti-inflammatories (NSAID): Secondary | ICD-10-CM | POA: Diagnosis not present

## 2016-06-23 DIAGNOSIS — Z87891 Personal history of nicotine dependence: Secondary | ICD-10-CM | POA: Diagnosis not present

## 2016-06-23 DIAGNOSIS — R5383 Other fatigue: Secondary | ICD-10-CM | POA: Insufficient documentation

## 2016-06-23 DIAGNOSIS — G43109 Migraine with aura, not intractable, without status migrainosus: Secondary | ICD-10-CM | POA: Insufficient documentation

## 2016-06-23 DIAGNOSIS — R110 Nausea: Secondary | ICD-10-CM

## 2016-06-23 DIAGNOSIS — R51 Headache: Secondary | ICD-10-CM | POA: Diagnosis present

## 2016-06-23 DIAGNOSIS — Z79891 Long term (current) use of opiate analgesic: Secondary | ICD-10-CM | POA: Insufficient documentation

## 2016-06-23 MED ORDER — DIPHENHYDRAMINE HCL 25 MG PO CAPS
25.0000 mg | ORAL_CAPSULE | Freq: Once | ORAL | Status: AC
  Administered 2016-06-23: 25 mg via ORAL
  Filled 2016-06-23: qty 1

## 2016-06-23 MED ORDER — DEXAMETHASONE SODIUM PHOSPHATE 10 MG/ML IJ SOLN
10.0000 mg | Freq: Once | INTRAMUSCULAR | Status: DC
  Filled 2016-06-23: qty 1

## 2016-06-23 MED ORDER — DEXAMETHASONE SODIUM PHOSPHATE 10 MG/ML IJ SOLN: 10.0000 mg | Freq: Once | INTRAMUSCULAR | Status: DC

## 2016-06-23 MED ORDER — KETOROLAC TROMETHAMINE 30 MG/ML IJ SOLN
30.0000 mg | Freq: Once | INTRAMUSCULAR | Status: AC
  Administered 2016-06-23: 30 mg via INTRAVENOUS
  Filled 2016-06-23: qty 1

## 2016-06-23 MED ORDER — METOCLOPRAMIDE HCL 5 MG/ML IJ SOLN
10.0000 mg | Freq: Once | INTRAMUSCULAR | Status: AC
  Administered 2016-06-23: 10 mg via INTRAVENOUS
  Filled 2016-06-23: qty 2

## 2016-06-23 MED ORDER — METOCLOPRAMIDE HCL 5 MG/ML IJ SOLN
10.0000 mg | Freq: Once | INTRAMUSCULAR | Status: DC
  Filled 2016-06-23: qty 2

## 2016-06-23 MED ORDER — DIPHENHYDRAMINE HCL 50 MG/ML IJ SOLN
12.5000 mg | Freq: Once | INTRAMUSCULAR | Status: AC
  Administered 2016-06-23: 12.5 mg via INTRAVENOUS
  Filled 2016-06-23: qty 1

## 2016-06-23 MED ORDER — METOCLOPRAMIDE HCL 5 MG/ML IJ SOLN: 10.0000 mg | Freq: Once | INTRAMUSCULAR | Status: DC

## 2016-06-23 NOTE — ED Notes (Signed)
Pt reports nausea x 3 days after she took "a lot of ibuprofen for headache". She sts she thinks she took at least 8 of 200 mg over the 24 hours.

## 2016-06-23 NOTE — ED Notes (Signed)
Verbalized understanding discharge instructions and follow-up. In no acute distress.   

## 2016-06-23 NOTE — ED Provider Notes (Signed)
CSN: [id number]     Arrival date & time 06/23/16  1350 History   First MD Initiated Contact with Patient 06/23/16 1411     Chief Complaint  Patient presents with  . Nausea  . Headache     (Consider location/radiation/quality/duration/timing/severity/associated sxs/prior Treatment) HPI   This is a 40 year old female who presents to ED for evaluation of headache and nausea. The patient reports that 5 days ago she started having a headache she describes as a "migraine." She describes it as left sided, rated 6/10 in intensity, pulsitile and admits to having sensitivity to sound. She reports taking 8 200'mg ibuprofen since her headache started which have not relieved her symptoms. She reports she rarely gets migraines and ibuprofen sometimes relieves her symptoms. She has not tried anything other medications for migraines.  She now complains of nausea x3 days which she attributes to the ibuprofen. She reports 1 episode of vomiting in which she vomited a small amount of saliva with some blood. She denies any lightheadedness, abdominal pain, abdominal tenderness, bloody or dark black stools.   Past Medical History  Diagnosis Date  . Migraine   . Leg pain   . Fibroids   . History of hiatal hernia    Past Surgical History  Procedure Laterality Date  . Tubal ligation    . Wisdom tooth extraction    . Bilateral salpingectomy Bilateral 09/29/2015    Procedure: BILATERAL SALPINGECTOMY;  Surgeon: Mora Bellman, MD;  Location: [facility];  Service: Gynecology;  Laterality: Bilateral;  . Abdominal hysterectomy  09/29/2015    Procedure: HYSTERECTOMY ABDOMINAL;  Surgeon: Mora Bellman, MD;  Location: [facility];  Service: Gynecology;;   Family History  Problem Relation Age of Onset  . Diabetes Sister   . Heart disease Paternal Grandmother   . Hypertension Father   . Dementia Mother   . Hypertension Mother    Social History  Substance Use Topics  . Smoking status: Former Research scientist (life sciences)  . Smokeless  tobacco: Never Used  . Alcohol Use: No   OB History    Gravida Para Term Preterm AB TAB SAB Ectopic Multiple Living   2 2 2  0 0 0 0 0 0 2     Review of Systems  Constitutional: Positive for fatigue. Negative for fever and chills.  HENT: Negative for rhinorrhea, sinus pressure and sneezing.        Positive for phonophobia  Eyes: Negative for photophobia and pain.  Respiratory: Negative for cough and shortness of breath.   Cardiovascular: Negative for chest pain.  Gastrointestinal: Positive for nausea and vomiting. Negative for abdominal pain, diarrhea and blood in stool.  Genitourinary: Negative for dysuria and difficulty urinating.  Musculoskeletal: Negative for myalgias, arthralgias, neck pain and neck stiffness.  Neurological: Positive for headaches. Negative for dizziness, light-headedness and numbness.  Psychiatric/Behavioral: Negative for confusion.      Allergies  Review of patient's allergies indicates no known allergies.  Home Medications   Prior to Admission medications   Medication Sig Start Date End Date Taking? Authorizing Provider  Aspirin-Salicylamide-Caffeine (BC HEADACHE POWDER PO) Take 1 Package by mouth daily as needed (pain).   Yes Historical Provider, MD  ibuprofen (ADVIL,MOTRIN) 200 MG tablet Take 800 mg by mouth every 6 (six) hours as needed for headache.   Yes Historical Provider, MD  traMADol (ULTRAM) 50 MG tablet Take 1 tablet (50 mg total) by mouth every 8 (eight) hours as needed. Patient taking differently: Take 50 mg by mouth every 8 (eight)  hours as needed for moderate pain.  06/13/16  Yes Margarita Mail, PA-C  cyclobenzaprine (FLEXERIL) 10 MG tablet Take 1 tablet (10 mg total) by mouth 3 (three) times daily as needed for muscle spasms. Patient not taking: Reported on 06/23/2016 123XX123   Delora Fuel, MD  diclofenac sodium (VOLTAREN) 1 % GEL Apply 4 g topically 4 (four) times daily. Patient not taking: Reported on 06/23/2016 12/31/15   Tresa Garter, MD  naproxen (NAPROSYN) 500 MG tablet Take 1 tablet (500 mg total) by mouth 2 (two) times daily. Patient not taking: Reported on 06/23/2016 03/02/16   Charlesetta Shanks, MD  orphenadrine (NORFLEX) 100 MG tablet Take 1 tablet (100 mg total) by mouth 2 (two) times daily. Patient not taking: Reported on 06/23/2016 03/02/16   Charlesetta Shanks, MD   BP 101/79 mmHg  Pulse 102  Temp(Src) 98.6 F (37 C) (Oral)  Resp 15  SpO2 97%  LMP 08/15/2015 (Approximate) Physical Exam  Constitutional: She is oriented to person, place, and time. She appears well-developed and well-nourished.  HENT:  Head: Normocephalic and atraumatic.  Eyes: Pupils are equal, round, and reactive to light.  Neck: Normal range of motion. No Brudzinski's sign noted.  Cardiovascular: Normal rate and regular rhythm.   Pulmonary/Chest: Effort normal. She has no wheezes. She has no rales.  Abdominal: Soft. Bowel sounds are normal. She exhibits no distension. There is no tenderness. There is no guarding.  Neurological: She is alert and oriented to person, place, and time.  Skin: Skin is warm and dry.    ED Course  Procedures (including critical care time) Labs Review Labs Reviewed - No data to display  Imaging Review No results found. I have personally reviewed and evaluated these images and lab results as part of my medical decision-making.   EKG Interpretation None      MDM   Final diagnoses:  Migraine with aura and without status migrainosus, not intractable  Nausea    Reglan 10mg  IM, Decadron 10mg  IM and 25mg  benadryl given and were effective for both patients nausea and migraine. Patient denies any symptoms at this time and will be discharged home.     [place], DO 06/23/16 1744

## 2016-06-23 NOTE — ED Provider Notes (Signed)
I saw and evaluated the patient, reviewed the resident's note and I agree with the findings and plan.   EKG Interpretation None     Patient here complaining of headache similar to her prior migraines. Neurological exam is nonfocal. We treated with migraine cocktail and discharged home  Lacretia Leigh, MD 06/23/16 1616

## 2016-06-23 NOTE — Discharge Instructions (Signed)
Drink plenty of fluids. Take all home meds as prescribed. Follow up with PCP 1 week. Return to ER of symptoms worsen.   Migraine Headache A migraine headache is very bad, throbbing pain on one or both sides of your head. Talk to your doctor about what things may bring on (trigger) your migraine headaches. HOME CARE  Only take medicines as told by your doctor.  Lie down in a dark, quiet room when you have a migraine.  Keep a journal to find out if certain things bring on migraine headaches. For example, write down:  What you eat and drink.  How much sleep you get.  Any change to your diet or medicines.  Lessen how much alcohol you drink.  Quit smoking if you smoke.  Get enough sleep.  Lessen any stress in your life.  Keep lights dim if bright lights bother you or make your migraines worse. GET HELP RIGHT AWAY IF:   Your migraine becomes really bad.  You have a fever.  You have a stiff neck.  You have trouble seeing.  Your muscles are weak, or you lose muscle control.  You lose your balance or have trouble walking.  You feel like you will pass out (faint), or you pass out.  You have really bad symptoms that are different than your first symptoms. MAKE SURE YOU:   Understand these instructions.  Will watch your condition.  Will get help right away if you are not doing well or get worse.   This information is not intended to replace advice given to you by your health care provider. Make sure you discuss any questions you have with your health care provider.   Document Released: 08/30/2008 Document Revised: 02/13/2012 Document Reviewed: 07/29/2013 Elsevier Interactive Patient Education [employer].

## 2016-06-28 ENCOUNTER — Encounter (HOSPITAL_COMMUNITY): Payer: Self-pay | Admitting: Emergency Medicine

## 2016-06-28 ENCOUNTER — Emergency Department (HOSPITAL_COMMUNITY)
Admission: EM | Admit: 2016-06-28 | Discharge: 2016-06-28 | Disposition: A | Discharge status: Home / Self Care | Payer: Medicaid Other | Attending: Emergency Medicine | Admitting: Emergency Medicine

## 2016-06-28 DIAGNOSIS — Z87891 Personal history of nicotine dependence: Secondary | ICD-10-CM | POA: Diagnosis not present

## 2016-06-28 DIAGNOSIS — Z7982 Long term (current) use of aspirin: Secondary | ICD-10-CM | POA: Insufficient documentation

## 2016-06-28 DIAGNOSIS — M5442 Lumbago with sciatica, left side: Secondary | ICD-10-CM | POA: Insufficient documentation

## 2016-06-28 DIAGNOSIS — M5432 Sciatica, left side: Secondary | ICD-10-CM

## 2016-06-28 DIAGNOSIS — M79605 Pain in left leg: Secondary | ICD-10-CM | POA: Diagnosis present

## 2016-06-28 MED ORDER — PREDNISONE 20 MG PO TABS: 40.0000 mg | ORAL_TABLET | Freq: Every day | ORAL | 0 refills | Status: DC

## 2016-06-28 NOTE — ED Provider Notes (Signed)
[facility] DEPT Provider Note   CSN: [id number] Arrival date & time: 06/28/16  1454  First Provider Contact:  None    By signing my name below, I, Hansel Feinstein, attest that this documentation has been prepared under the direction and in the presence of [name], PA-C. Electronically Signed: Hansel Feinstein, ED Scribe. 06/28/16. 3:09 PM.    History   Chief Complaint Chief Complaint  Patient presents with  . Leg Pain    HPI Brianna Velez is a 40 y.o. female who presents to the Emergency Department complaining of moderate, acute on chronic, aching left leg pain onset this afternoon while ambulating. She denies recent injury, trauma or falls. Pt also notes a few, occasional episodes of urinary incontinence, but notes this is rare for her. Pt is ambulatory without difficulty. Pt states that pain is worsened with ambulation and weight bearing. Pt states frequent h/o similar pain with increased activity and has previously been diagnosed with a pinched nerve. Pt states she has an appointment with a pain management clinic next month. She denies fever, bowel incontinence, numbness or weakness to the lower extremities.   The history is provided by the patient. No language interpreter was used.    Past Medical History:  Diagnosis Date  . Fibroids   . History of hiatal hernia   . Leg pain   . Migraine     Patient Active Problem List   Diagnosis Date Noted  . Chronic pain syndrome 03/31/2016  . Screening for diabetes mellitus 12/31/2015  . S/P TAH (total abdominal hysterectomy) 09/29/2015  . Fibroid, uterine   . Menorrhagia with regular cycle   . Fibroid uterus 07/01/2015  . Left hip pain 11/06/2014    Past Surgical History:  Procedure Laterality Date  . ABDOMINAL HYSTERECTOMY  09/29/2015   Procedure: HYSTERECTOMY ABDOMINAL;  Surgeon: Mora Bellman, MD;  Location: [facility];  Service: Gynecology;;  . BILATERAL SALPINGECTOMY Bilateral 09/29/2015   Procedure: BILATERAL  SALPINGECTOMY;  Surgeon: Mora Bellman, MD;  Location: [facility];  Service: Gynecology;  Laterality: Bilateral;  . TUBAL LIGATION    . WISDOM TOOTH EXTRACTION      OB History    Gravida Para Term Preterm AB Living   2 2 2  0 0 2   SAB TAB Ectopic Multiple Live Births   0 0 0 0         Home Medications    Prior to Admission medications   Medication Sig Start Date End Date Taking? Authorizing Provider  Aspirin-Salicylamide-Caffeine (BC HEADACHE POWDER PO) Take 1 Package by mouth daily as needed (pain).    Historical Provider, MD  cyclobenzaprine (FLEXERIL) 10 MG tablet Take 1 tablet (10 mg total) by mouth 3 (three) times daily as needed for muscle spasms. Patient not taking: Reported on 06/23/2016 123XX123   Delora Fuel, MD  diclofenac sodium (VOLTAREN) 1 % GEL Apply 4 g topically 4 (four) times daily. Patient not taking: Reported on 06/23/2016 12/31/15   Tresa Garter, MD  ibuprofen (ADVIL,MOTRIN) 200 MG tablet Take 800 mg by mouth every 6 (six) hours as needed for headache.    Historical Provider, MD  naproxen (NAPROSYN) 500 MG tablet Take 1 tablet (500 mg total) by mouth 2 (two) times daily. Patient not taking: Reported on 06/23/2016 03/02/16   Charlesetta Shanks, MD  orphenadrine (NORFLEX) 100 MG tablet Take 1 tablet (100 mg total) by mouth 2 (two) times daily. Patient not taking: Reported on 06/23/2016 03/02/16   Charlesetta Shanks, MD  traMADol Veatrice Bourbon)  50 MG tablet Take 1 tablet (50 mg total) by mouth every 8 (eight) hours as needed. Patient taking differently: Take 50 mg by mouth every 8 (eight) hours as needed for moderate pain.  06/13/16   Margarita Mail, PA-C    Family History Family History  Problem Relation Age of Onset  . Diabetes Sister   . Heart disease Paternal Grandmother   . Hypertension Father   . Dementia Mother   . Hypertension Mother     Social History Social History  Substance Use Topics  . Smoking status: Former Research scientist (life sciences)  . Smokeless tobacco: Never Used  .  Alcohol use No     Allergies   Review of patient's allergies indicates no known allergies.   Review of Systems Review of Systems  Constitutional: Negative for fever.  Gastrointestinal:       -bowel incontinence +occasional bladder incontinence   Musculoskeletal: Positive for myalgias (left leg).  Neurological: Negative for weakness and numbness.     Physical Exam Updated Vital Signs BP 115/83 (BP Location: Right Arm)   Pulse 100   Temp 98.5 F (36.9 C) (Oral)   Resp 16   Ht 5\' 4"  (1.626 m)   Wt 98 lb (44.5 kg)   LMP 08/15/2015 (Approximate)   SpO2 99%   BMI 16.82 kg/m   Physical Exam  Physical Exam  Constitutional: Pt appears well-developed and well-nourished. No distress.  HENT:  Head: Normocephalic and atraumatic.  Mouth/Throat: Oropharynx is clear and moist. No oropharyngeal exudate.  Eyes: Conjunctivae are normal.  Neck: Normal range of motion. Neck supple.  No meningismus Cardiovascular: Normal rate, regular rhythm and intact distal pulses.   Pulmonary/Chest: Effort normal and breath sounds normal. No respiratory distress. Pt has no wheezes.  Abdominal: Pt exhibits no distension Musculoskeletal:  Left lumbar paraspinal muscles tender to palpation, no bony CTLS spine tenderness, deformity, step-off, or crepitus Lymphadenopathy: Pt has no cervical adenopathy.  Neurological: Pt is alert and oriented Speech is clear and goal oriented, follows commands Normal 5/5 strength in upper and lower extremities bilaterally including dorsiflexion and plantar flexion, strong and equal grip strength Sensation intact Great toe extension intact Moves extremities without ataxia, coordination intact Ankle and knee jerk reflexes intact and symmetrical  Normal gait Normal balance No Clonus Skin: Skin is warm and dry. No rash noted. Pt is not diaphoretic. No erythema.  Psychiatric: Pt has a normal mood and affect. Behavior is normal.  Nursing note and vitals reviewed.  ED  Treatments / Results   Procedures Procedures (including critical care time)  DIAGNOSTIC STUDIES: Oxygen Saturation is 99% on RA, normal by my interpretation.    COORDINATION OF CARE: 3:03 PM Discussed treatment plan with pt at bedside which includes orthopedic referral and pt agreed to plan.    Medications Ordered in ED Medications - No data to display   Initial Impression / Assessment and Plan / ED Course  I have reviewed the triage vital signs and the nursing notes.  Pertinent labs & imaging results that were available during my care of the patient were reviewed by me and considered in my medical decision making (see chart for details).  Clinical Course    Patient with back pain.  No neurological deficits and normal neuro exam.  Patient is ambulatory.  No loss of bowel or bladder control.  Doubt cauda equina.  Denies fever,  doubt epidural abscess or other lesion. Recommend back exercises, stretching, RICE, and will treat with a short course of prednisone.  Encouraged  the patient that there could be a need for additional workup and/or imaging such as MRI, if the symptoms do not resolve. Patient advised that if the back pain does not resolve, or radiates, this could progress to more serious conditions and is encouraged to follow-up with PCP or orthopedics within 2 weeks.     Final Clinical Impressions(s) / ED Diagnoses   Final diagnoses:  Sciatica of left side    New Prescriptions New Prescriptions   PREDNISONE (DELTASONE) 20 MG TABLET    Take 2 tablets (40 mg total) by mouth daily.   I personally performed the services described in this documentation, which was scribed in my presence. The recorded information has been reviewed and is accurate.      [name], PA-C 06/28/16 1512    Virgel Manifold, MD 07/02/16 [phone]

## 2016-06-28 NOTE — ED Triage Notes (Signed)
Pt sts left leg pain from sciatica with hx of same

## 2016-06-29 ENCOUNTER — Emergency Department (HOSPITAL_COMMUNITY)
Admission: EM | Admit: 2016-06-29 | Discharge: 2016-06-30 | Disposition: A | Discharge status: Home / Self Care | Payer: Medicaid Other | Attending: Emergency Medicine | Admitting: Emergency Medicine

## 2016-06-29 ENCOUNTER — Encounter (HOSPITAL_COMMUNITY): Payer: Self-pay | Admitting: Emergency Medicine

## 2016-06-29 DIAGNOSIS — Z87891 Personal history of nicotine dependence: Secondary | ICD-10-CM | POA: Diagnosis not present

## 2016-06-29 DIAGNOSIS — M5442 Lumbago with sciatica, left side: Secondary | ICD-10-CM | POA: Diagnosis not present

## 2016-06-29 DIAGNOSIS — G8929 Other chronic pain: Secondary | ICD-10-CM | POA: Insufficient documentation

## 2016-06-29 DIAGNOSIS — M5432 Sciatica, left side: Secondary | ICD-10-CM

## 2016-06-29 DIAGNOSIS — Z7982 Long term (current) use of aspirin: Secondary | ICD-10-CM | POA: Insufficient documentation

## 2016-06-29 HISTORY — DX: Sciatica, unspecified side: M54.30

## 2016-06-29 MED ORDER — TRAMADOL HCL 50 MG PO TABS
50.0000 mg | ORAL_TABLET | Freq: Once | ORAL | Status: AC
  Administered 2016-06-29: 50 mg via ORAL
  Filled 2016-06-29: qty 1

## 2016-06-29 NOTE — ED Provider Notes (Signed)
[facility] DEPT Provider Note   CSN: [id number] Arrival date & time: 06/29/16  2144  First Provider Contact:  None       History   Chief Complaint Chief Complaint  Patient presents with  . Sciatica    HPI Brianna Velez is a 40 y.o. female.  Patient with a history of left sciatica presents with typical radiating, left lower back pain. She reports Ultram usually controls her pain but she is currently out of this medication. She is [facility] at the [facility] and has been referred to Pain Management but appointment is not until August 29th. No new symptoms or recent injury. No urinary/bowel incontinence.    The history is provided by the patient. No language interpreter was used.    Past Medical History:  Diagnosis Date  . Fibroids   . History of hiatal hernia   . Leg pain   . Migraine   . Sciatica     Patient Active Problem List   Diagnosis Date Noted  . Chronic pain syndrome 03/31/2016  . Screening for diabetes mellitus 12/31/2015  . S/P TAH (total abdominal hysterectomy) 09/29/2015  . Fibroid, uterine   . Menorrhagia with regular cycle   . Fibroid uterus 07/01/2015  . Left hip pain 11/06/2014    Past Surgical History:  Procedure Laterality Date  . ABDOMINAL HYSTERECTOMY  09/29/2015   Procedure: HYSTERECTOMY ABDOMINAL;  Surgeon: Mora Bellman, MD;  Location: [facility];  Service: Gynecology;;  . BILATERAL SALPINGECTOMY Bilateral 09/29/2015   Procedure: BILATERAL SALPINGECTOMY;  Surgeon: Mora Bellman, MD;  Location: [facility];  Service: Gynecology;  Laterality: Bilateral;  . TUBAL LIGATION    . WISDOM TOOTH EXTRACTION      OB History    Gravida Para Term Preterm AB Living   2 2 2  0 0 2   SAB TAB Ectopic Multiple Live Births   0 0 0 0         Home Medications    Prior to Admission medications   Medication Sig Start Date End Date Taking? Authorizing Provider  Aspirin-Salicylamide-Caffeine (BC HEADACHE POWDER PO) Take 1 Package by mouth daily  as needed (pain).    Historical Provider, MD  cyclobenzaprine (FLEXERIL) 10 MG tablet Take 1 tablet (10 mg total) by mouth 3 (three) times daily as needed for muscle spasms. Patient not taking: Reported on 06/23/2016 123XX123   Delora Fuel, MD  diclofenac sodium (VOLTAREN) 1 % GEL Apply 4 g topically 4 (four) times daily. Patient not taking: Reported on 06/23/2016 12/31/15   Tresa Garter, MD  ibuprofen (ADVIL,MOTRIN) 200 MG tablet Take 800 mg by mouth every 6 (six) hours as needed for headache.    Historical Provider, MD  naproxen (NAPROSYN) 500 MG tablet Take 1 tablet (500 mg total) by mouth 2 (two) times daily. Patient not taking: Reported on 06/23/2016 03/02/16   Charlesetta Shanks, MD  orphenadrine (NORFLEX) 100 MG tablet Take 1 tablet (100 mg total) by mouth 2 (two) times daily. Patient not taking: Reported on 06/23/2016 03/02/16   Charlesetta Shanks, MD  predniSONE (DELTASONE) 20 MG tablet Take 2 tablets (40 mg total) by mouth daily. 06/28/16   [name], PA-C  traMADol (ULTRAM) 50 MG tablet Take 1 tablet (50 mg total) by mouth every 8 (eight) hours as needed. Patient taking differently: Take 50 mg by mouth every 8 (eight) hours as needed for moderate pain.  06/13/16   Margarita Mail, PA-C    Family History Family History  Problem Relation Age  of Onset  . Diabetes Sister   . Heart disease Paternal Grandmother   . Hypertension Father   . Dementia Mother   . Hypertension Mother     Social History Social History  Substance Use Topics  . Smoking status: Former Research scientist (life sciences)  . Smokeless tobacco: Never Used  . Alcohol use No     Allergies   Review of patient's allergies indicates no known allergies.   Review of Systems Review of Systems  Constitutional: Negative for chills and fever.  Gastrointestinal: Negative.  Negative for nausea.  Genitourinary: Negative.  Negative for difficulty urinating and enuresis.  Musculoskeletal: Positive for back pain.  Skin: Negative.   Neurological:  Negative.  Negative for weakness, numbness and headaches.     Physical Exam Updated Vital Signs BP 119/74   Pulse 74   Temp 98.2 F (36.8 C) (Oral)   Resp 19   Ht 5\' 4"  (1.626 m)   Wt 44.5 kg   LMP 08/15/2015 (Approximate)   SpO2 100%   BMI 16.82 kg/m   Physical Exam  Constitutional: She appears well-developed and well-nourished.  HENT:  Head: Normocephalic.  Neck: Normal range of motion. Neck supple.  Cardiovascular: Normal rate and regular rhythm.   Pulmonary/Chest: Effort normal and breath sounds normal.  Abdominal: Soft. Bowel sounds are normal. There is no tenderness. There is no rebound and no guarding.  Musculoskeletal: Normal range of motion.  Mild left paralumbar tenderness without swelling or discoloration. Tenderness extends to left buttock.   Neurological: She is alert. She has normal reflexes. No cranial nerve deficit. Coordination normal.  Skin: Skin is warm and dry. No rash noted.  Psychiatric: She has a normal mood and affect.     ED Treatments / Results  Labs (all labs ordered are listed, but only abnormal results are displayed) Labs Reviewed - No data to display  EKG  EKG Interpretation None       Radiology No results found.  Procedures Procedures (including critical care time)  Medications Ordered in ED Medications  traMADol (ULTRAM) tablet 50 mg (50 mg Oral Given 06/29/16 2313)     Initial Impression / Assessment and Plan / ED Course  I have reviewed the triage vital signs and the nursing notes.  Pertinent labs & imaging results that were available during my care of the patient were reviewed by me and considered in my medical decision making (see chart for details).  Clinical Course    Patient with chronic left sciatica presents with typical pain, uncontrolled while out of her medications. The patient has had multiple visits for same in the emergency department and with [facility]. She has been referred to Pain  Management. [place] Controlled Substances database consulted with finding of multiple Rx's for Ultram and ongoing basis.   Discussed that chronic pain prescriptions cannot be provided in the ED. Patient was given one Ultram here and encouraged to see her doctor for assistance with pain management until her Pain Clinic appointment.   Final Clinical Impressions(s) / ED Diagnoses   Final diagnoses:  None  1. Chronic back pain 2. Left sciatica  New Prescriptions New Prescriptions   No medications on file     Dennie Bible 06/29/16 2335    Charlesetta Shanks, MD 07/05/16 [phone]

## 2016-06-29 NOTE — ED Triage Notes (Signed)
Patient reports sciatica pain at left lower back radiating to left leg onset today , denies injury/ambulatory , pain increases with movement , denies urinary discomfort or hematuria .

## 2016-06-30 ENCOUNTER — Encounter (HOSPITAL_COMMUNITY): Payer: Self-pay | Admitting: *Deleted

## 2016-06-30 ENCOUNTER — Emergency Department (HOSPITAL_COMMUNITY)
Admission: EM | Admit: 2016-06-30 | Discharge: 2016-06-30 | Disposition: A | Discharge status: Home / Self Care | Payer: Medicaid Other | Source: Home / Self Care | Attending: Emergency Medicine | Admitting: Emergency Medicine

## 2016-06-30 DIAGNOSIS — M549 Dorsalgia, unspecified: Principal | ICD-10-CM

## 2016-06-30 DIAGNOSIS — G8929 Other chronic pain: Secondary | ICD-10-CM

## 2016-06-30 MED ORDER — TRAMADOL HCL 50 MG PO TABS
50.0000 mg | ORAL_TABLET | Freq: Once | ORAL | Status: AC
  Administered 2016-06-30: 50 mg via ORAL
  Filled 2016-06-30: qty 1

## 2016-06-30 MED ORDER — TRAMADOL HCL 50 MG PO TABS: 50.0000 mg | ORAL_TABLET | Freq: Four times a day (QID) | ORAL | 0 refills | Status: DC | PRN

## 2016-06-30 NOTE — Discharge Instructions (Signed)
°  It was my pleasure taking care of you today!    You need to see your primary care provider or pain management physician for refills of your pain medication. It is a policy in the Emergency Department that we do not provide prescriptions for chronic pain. Please return to ER for new or worsening symptoms or any additional concerns.

## 2016-06-30 NOTE — ED Provider Notes (Signed)
[facility] DEPT Provider Note   CSN: [id number] Arrival date & time: 06/30/16  [id number]  First Provider Contact:  06/30/2016 6:18 PM   By signing my name below, I, Dyke Brackett, attest that this documentation has been prepared under the direction and in the presence of non-physician practitioner, Amie Portland, PA-C. Electronically Signed: Dyke Brackett, Scribe. 06/30/2016. 6:15 PM.  History   Chief Complaint Chief Complaint  Patient presents with  . Back Pain    HPI Brianna Velez is a 40 y.o. female with PMHx of Sciatica who presents to the Emergency Department complaining of moderate, chronic, lower back with radiation to left leg onset yesterday. Pt describes her pain as "typical". She was seen in the ED for the same yesterday and given one Tramadol and encouraged to follow up with PCP until her appointment with Pain Management. Pt states she has taken steroid medication given this week as well as ibuprofen and used ice/heat with no relief. Pt reports she no longer has a PCP after he dismissed her due to her upcoming appointment with Pain Management on 08/02/16. She notes associated numbness and tingling in her left leg which is typical for her. Pt denies fever, saddle anesthesia, nausea, and vomiting.   The history is provided by the patient. No language interpreter was used.    Past Medical History:  Diagnosis Date  . Fibroids   . History of hiatal hernia   . Leg pain   . Migraine   . Sciatica     Patient Active Problem List   Diagnosis Date Noted  . Chronic pain syndrome 03/31/2016  . Screening for diabetes mellitus 12/31/2015  . S/P TAH (total abdominal hysterectomy) 09/29/2015  . Fibroid, uterine   . Menorrhagia with regular cycle   . Fibroid uterus 07/01/2015  . Left hip pain 11/06/2014    Past Surgical History:  Procedure Laterality Date  . ABDOMINAL HYSTERECTOMY  09/29/2015   Procedure: HYSTERECTOMY ABDOMINAL;  Surgeon: Mora Bellman, MD;  Location: [facility];   Service: Gynecology;;  . BILATERAL SALPINGECTOMY Bilateral 09/29/2015   Procedure: BILATERAL SALPINGECTOMY;  Surgeon: Mora Bellman, MD;  Location: [facility];  Service: Gynecology;  Laterality: Bilateral;  . TUBAL LIGATION    . WISDOM TOOTH EXTRACTION      OB History    Gravida Para Term Preterm AB Living   2 2 2  0 0 2   SAB TAB Ectopic Multiple Live Births   0 0 0 0         Home Medications    Prior to Admission medications   Medication Sig Start Date End Date Taking? Authorizing Provider  Aspirin-Salicylamide-Caffeine (BC HEADACHE POWDER PO) Take 1 Package by mouth daily as needed (pain).    Historical Provider, MD  cyclobenzaprine (FLEXERIL) 10 MG tablet Take 1 tablet (10 mg total) by mouth 3 (three) times daily as needed for muscle spasms. Patient not taking: Reported on 06/23/2016 123XX123   Delora Fuel, MD  diclofenac sodium (VOLTAREN) 1 % GEL Apply 4 g topically 4 (four) times daily. Patient not taking: Reported on 06/23/2016 12/31/15   Tresa Garter, MD  ibuprofen (ADVIL,MOTRIN) 200 MG tablet Take 800 mg by mouth every 6 (six) hours as needed for headache.    Historical Provider, MD  naproxen (NAPROSYN) 500 MG tablet Take 1 tablet (500 mg total) by mouth 2 (two) times daily. Patient not taking: Reported on 06/23/2016 03/02/16   Charlesetta Shanks, MD  orphenadrine (NORFLEX) 100 MG tablet Take 1 tablet (100 mg total)  by mouth 2 (two) times daily. Patient not taking: Reported on 06/23/2016 03/02/16   Charlesetta Shanks, MD  predniSONE (DELTASONE) 20 MG tablet Take 2 tablets (40 mg total) by mouth daily. 06/28/16   [name], PA-C  traMADol (ULTRAM) 50 MG tablet Take 1 tablet (50 mg total) by mouth every 6 (six) hours as needed. 06/30/16   [place], PA-C    Family History Family History  Problem Relation Age of Onset  . Diabetes Sister   . Heart disease Paternal Grandmother   . Hypertension Father   . Dementia Mother   . Hypertension Mother     Social  History Social History  Substance Use Topics  . Smoking status: Former Research scientist (life sciences)  . Smokeless tobacco: Never Used  . Alcohol use No     Allergies   Review of patient's allergies indicates no known allergies.   Review of Systems Review of Systems  Constitutional: Negative for fever.  Gastrointestinal: Negative for nausea and vomiting.  Musculoskeletal: Positive for arthralgias and back pain.  Neurological: Positive for numbness.     Physical Exam Updated Vital Signs BP 113/66 (BP Location: Left Arm)   Pulse 83   Temp 98.4 F (36.9 C) (Oral)   Resp 14   LMP 08/15/2015 (Approximate)   SpO2 100%   Physical Exam  Constitutional: She is oriented to person, place, and time. She appears well-developed and well-nourished. No distress.  HENT:  Head: Normocephalic and atraumatic.  Neck: Neck supple.  Full ROM without pain No midline tenderness No tenderness of paraspinal musculature  Cardiovascular: Normal rate, regular rhythm, normal heart sounds and intact distal pulses.  Exam reveals no gallop and no friction rub.   No murmur heard. Pulmonary/Chest: Effort normal and breath sounds normal. No respiratory distress. She has no wheezes. She has no rales.  Abdominal: Soft. Bowel sounds are normal. She exhibits no distension. There is no tenderness.  Musculoskeletal:  Left paraspinal tenderness. No midline tenderness.  5/5 muscle strength of bilateral LE's .  Neurological: She is alert and oriented to person, place, and time. She has normal reflexes.  Bilateral lower extremities neurovascularly intact.  Skin: Skin is warm and dry. No rash noted. No erythema.  Psychiatric: She has a normal mood and affect.  Nursing note and vitals reviewed.    ED Treatments / Results  DIAGNOSTIC STUDIES:  Oxygen Saturation is 100% on RA, normal by my interpretation.    COORDINATION OF CARE:  6:10 PM Will review pt's chart and order tramadol. Discussed treatment plan with pt at bedside and  pt agreed to plan.  Labs (all labs ordered are listed, but only abnormal results are displayed) Labs Reviewed - No data to display  EKG  EKG Interpretation None       Radiology No results found.  Procedures Procedures (including critical care time)  Medications Ordered in ED Medications  traMADol (ULTRAM) tablet 50 mg (50 mg Oral Given 06/30/16 1907)     Initial Impression / Assessment and Plan / ED Course  I have reviewed the triage vital signs and the nursing notes.  Pertinent labs & imaging results that were available during my care of the patient were reviewed by me and considered in my medical decision making (see chart for details).  Clinical Course   Alexxa Ottaviani presents to ED for back pain which is c/w her typical chronic back pain. Patient is well-known to ER and been seen multiple times for this same. She was seen yesterday for this  same complaint and was not given any narcotic pain medication. I asked patient what her expectations were today, to which she responded "I just need 2-3 tramadol to get me through the next few days." Tramadol given in ED and rx for 3 tramadol given for home. A significant amount of time was taken to discuss chronic pain management in the ER. I specifically discussed that providing narcotic pain medication in the ER is not in the patient's best interest and this needs to be done by a primary physician or pain management specialist. I have urged the patient to have close follow-up with their provider or pain specialist and explicitly discussed with the patient return precautions and reassured patient that they can always be seen and evaluated in the emergency department for any condition they feel is emergent. She states that she has an appointment with a pain doctor next month and I encouraged her to keep this scheduled appointment. All questions answered.   The patient was given the opportunity to voice any further questions or concerns and  these were addressed to the best of my ability.  Final Clinical Impressions(s) / ED Diagnoses   Final diagnoses:  Chronic back pain   I personally performed the services described in this documentation, which was scribed in my presence. The recorded information has been reviewed and is accurate.  New Prescriptions Discharge Medication List as of 06/30/2016  8:04 PM       [facility], PA-C 06/30/16 [id number]    Leonard Schwartz, MD 07/07/16 [phone]

## 2016-06-30 NOTE — ED Triage Notes (Signed)
Pt reports lower back pain due to sciatica. Was here yesterday but needing more pain meds until able to follow up with pain management. Pain is lower back and down left leg.

## 2016-06-30 NOTE — ED Notes (Signed)
Patient able to ambulate independently  

## 2016-07-03 ENCOUNTER — Emergency Department (HOSPITAL_COMMUNITY)
Admission: EM | Admit: 2016-07-03 | Discharge: 2016-07-03 | Disposition: A | Discharge status: Home / Self Care | Payer: Medicaid Other | Attending: Emergency Medicine | Admitting: Emergency Medicine

## 2016-07-03 ENCOUNTER — Encounter (HOSPITAL_COMMUNITY): Payer: Self-pay

## 2016-07-03 DIAGNOSIS — Z87891 Personal history of nicotine dependence: Secondary | ICD-10-CM | POA: Diagnosis not present

## 2016-07-03 DIAGNOSIS — M549 Dorsalgia, unspecified: Secondary | ICD-10-CM

## 2016-07-03 DIAGNOSIS — G8929 Other chronic pain: Secondary | ICD-10-CM | POA: Insufficient documentation

## 2016-07-03 DIAGNOSIS — M545 Low back pain: Secondary | ICD-10-CM | POA: Insufficient documentation

## 2016-07-03 DIAGNOSIS — Z7982 Long term (current) use of aspirin: Secondary | ICD-10-CM | POA: Diagnosis not present

## 2016-07-03 MED ORDER — TRAMADOL HCL 50 MG PO TABS
50.0000 mg | ORAL_TABLET | Freq: Once | ORAL | Status: AC
  Administered 2016-07-03: 50 mg via ORAL
  Filled 2016-07-03: qty 1

## 2016-07-03 NOTE — ED Triage Notes (Signed)
Patient here with chronic back pain, seen on 7/27 and states that she is out of her medicine, here for pain control. States that the pain radiates down left leg, no new injury

## 2016-07-03 NOTE — Discharge Instructions (Signed)
Take OTC tylenol for pain. Follow up with Dr. Berenice Primas, orthopedics, to have your back reevaluated.   Return to the emergency department if you experience weakness of your leg or foot, loss of bowel or bladder control, fevers, nausea, vomiting, or any other concerning symptoms.

## 2016-07-03 NOTE — ED Notes (Signed)
The pt has had lower back pain for 5 years  She ran out of tramadol which helps her pain and  Since she ran out of meds she  Is having more pain.  She goes to pain management

## 2016-07-03 NOTE — ED Provider Notes (Signed)
[facility] DEPT Provider Note   CSN: [id number] Arrival date & time: 07/03/16  1605  First Provider Contact:  None    By signing my name below, I, Brianna Velez, attest that this documentation has been prepared under the direction and in the presence of [employer].  Electronically Signed: Ephriam Velez, ED Scribe. 07/04/16. 12:25 AM.   History   Chief Complaint Chief Complaint  Patient presents with  . Back Pain    HPI Brianna Velez is a 40 y.o. female.  HPI Comments: Brianna Velez is a 40 y.o. female who presents to the Emergency Department complaining of constant, aching lower back pain that has been persistent for the past 6 years. Pt was seen on 06/23/16, 06/28/16, 06/29/16 and 06/30/16 for same complaints. Pt states she has been returning to the ED because she is out of her pain medication and is unable to make it until her pain management appointment on 08/02/16. Pt further reports that the pain radiates down her left leg and reports associated tingling that is typical for her. Pt was prescribed prednisone 20mg  bid on 06/28/16 and states she has taken them as instructed with no relief of symptoms. Pt currently reports 10/10 aching pain and states it feels the same as her chronic back pain. Pt states she normally takes two 100mg  Tramadol bid and that it is the only thing that has been able to relieve her pain. Pt states she has not had an MRI of her back. Pt states her PCP was with [facility] but was "dismissed" due to her upcoming appointment with Pain Management on 08/02/16.      The history is provided by the patient. No language interpreter was used.    Past Medical History:  Diagnosis Date  . Fibroids   . History of hiatal hernia   . Leg pain   . Migraine   . Sciatica     Patient Active Problem List   Diagnosis Date Noted  . Chronic pain syndrome 03/31/2016  . Screening for diabetes mellitus 12/31/2015  . S/P TAH (total abdominal  hysterectomy) 09/29/2015  . Fibroid, uterine   . Menorrhagia with regular cycle   . Fibroid uterus 07/01/2015  . Left hip pain 11/06/2014    Past Surgical History:  Procedure Laterality Date  . ABDOMINAL HYSTERECTOMY  09/29/2015   Procedure: HYSTERECTOMY ABDOMINAL;  Surgeon: Mora Bellman, MD;  Location: [facility];  Service: Gynecology;;  . BILATERAL SALPINGECTOMY Bilateral 09/29/2015   Procedure: BILATERAL SALPINGECTOMY;  Surgeon: Mora Bellman, MD;  Location: [facility];  Service: Gynecology;  Laterality: Bilateral;  . TUBAL LIGATION    . WISDOM TOOTH EXTRACTION      OB History    Gravida Para Term Preterm AB Living   2 2 2  0 0 2   SAB TAB Ectopic Multiple Live Births   0 0 0 0         Home Medications    Prior to Admission medications   Medication Sig Start Date End Date Taking? Authorizing Provider  Aspirin-Salicylamide-Caffeine (BC HEADACHE POWDER PO) Take 1 Package by mouth daily as needed (pain).    Historical Provider, MD  cyclobenzaprine (FLEXERIL) 10 MG tablet Take 1 tablet (10 mg total) by mouth 3 (three) times daily as needed for muscle spasms. Patient not taking: Reported on 06/23/2016 [id number]   Delora Fuel, MD  diclofenac sodium (VOLTAREN) 1 % GEL Apply 4 g topically 4 (four) times daily. Patient not taking: Reported on 06/23/2016 12/31/15  Tresa Garter, MD  ibuprofen (ADVIL,MOTRIN) 200 MG tablet Take 800 mg by mouth every 6 (six) hours as needed for headache.    Historical Provider, MD  naproxen (NAPROSYN) 500 MG tablet Take 1 tablet (500 mg total) by mouth 2 (two) times daily. Patient not taking: Reported on 06/23/2016 03/02/16   Charlesetta Shanks, MD  orphenadrine (NORFLEX) 100 MG tablet Take 1 tablet (100 mg total) by mouth 2 (two) times daily. Patient not taking: Reported on 06/23/2016 03/02/16   Charlesetta Shanks, MD  predniSONE (DELTASONE) 20 MG tablet Take 2 tablets (40 mg total) by mouth daily. 06/28/16   [name], PA-C  traMADol (ULTRAM) 50 MG tablet Take  1 tablet (50 mg total) by mouth every 6 (six) hours as needed. 06/30/16   [place], PA-C    Family History Family History  Problem Relation Age of Onset  . Diabetes Sister   . Heart disease Paternal Grandmother   . Hypertension Father   . Dementia Mother   . Hypertension Mother     Social History Social History  Substance Use Topics  . Smoking status: Former Research scientist (life sciences)  . Smokeless tobacco: Never Used  . Alcohol use No     Allergies   Review of patient's allergies indicates no known allergies.   Review of Systems Review of Systems  Respiratory: Negative for shortness of breath.   Cardiovascular: Negative for chest pain.  Gastrointestinal: Negative for nausea and vomiting.  Musculoskeletal: Positive for back pain. Negative for neck pain.  Skin: Negative for rash.  All other systems reviewed and are negative.    Physical Exam Updated Vital Signs BP 103/75 (BP Location: Left Arm)   Pulse 98   Temp 98.2 F (36.8 C) (Oral)   Resp 18   LMP 08/15/2015 (Approximate)   SpO2 100%   Physical Exam  Constitutional: She appears well-developed and well-nourished. No distress.  HENT:  Head: Normocephalic and atraumatic.  Eyes: Conjunctivae are normal.  Cardiovascular: Normal rate, regular rhythm and normal heart sounds.   Pulses:      Dorsalis pedis pulses are 2+ on the right side, and 2+ on the left side.  Pulmonary/Chest: Effort normal. No respiratory distress.  Musculoskeletal: Normal range of motion.  Condition lower back revealed no deformities, step-off, ecchymosis, edema, no tenderness to palpation of the lumbar spine, TTP to the left paraspinal muscles of the L-spine, pain elicited with bilateral straight leg raise, sensation intact to bilateral lower extremities, strength 5/5 of plantar flexion and extension, patient neurovascularly intact distally  Neurological: She is alert. Coordination normal.  Skin: Skin is warm and dry. She is not diaphoretic.    Psychiatric: She has a normal mood and affect. Her behavior is normal.  Nursing note and vitals reviewed.    ED Treatments / Results  DIAGNOSTIC STUDIES: Oxygen Saturation is 100% on RA, normal by my interpretation.  COORDINATION OF CARE: 6:36 PM-Will order medication for pain Discussed treatment plan with pt at bedside and pt agreed to plan.   Labs (all labs ordered are listed, but only abnormal results are displayed) Labs Reviewed - No data to display  EKG  EKG Interpretation None       Radiology No results found.  Procedures Procedures (including critical care time)  Medications Ordered in ED Medications  traMADol (ULTRAM) tablet 50 mg (50 mg Oral Given 07/03/16 1855)     Initial Impression / Assessment and Plan / ED Course  I have reviewed the triage vital signs and the  nursing notes.  Pertinent labs & imaging results that were available during my care of the patient were reviewed by me and considered in my medical decision making (see chart for details).  Clinical Course    Patient with back pain.  No neurological deficits and normal neuro exam.  Patient can walk but states is painful.  No loss of bowel or bladder control.  No concern for cauda equina.  This is the patient's typical back pain. She requested tramadol from me until she can make it to her pain management appointment at the end of August. I declined as patient has been seen numerous times in the ED resting tramadol. She was given a tramadol here in the ED. RICE protocol and pain medicine indicated and discussed with patient. Discussed strict return precautions. Patient expressed understanding to the discharge instructions.  I personally performed the services described in this documentation, which was scribed in my presence. The recorded information has been reviewed and is accurate.      Final Clinical Impressions(s) / ED Diagnoses   Final diagnoses:  Chronic back pain    New  Prescriptions Discharge Medication List as of 07/03/2016  6:46 PM        Kalman Drape, PA 07/04/16 [id number]    Tanna Furry, MD 07/15/16 1624

## 2016-07-08 ENCOUNTER — Encounter (HOSPITAL_COMMUNITY): Payer: Self-pay | Admitting: Emergency Medicine

## 2016-07-08 ENCOUNTER — Emergency Department (HOSPITAL_COMMUNITY)
Admission: EM | Admit: 2016-07-08 | Discharge: 2016-07-08 | Disposition: A | Discharge status: Home / Self Care | Payer: Medicaid Other | Attending: Emergency Medicine | Admitting: Emergency Medicine

## 2016-07-08 DIAGNOSIS — M5442 Lumbago with sciatica, left side: Secondary | ICD-10-CM | POA: Diagnosis not present

## 2016-07-08 DIAGNOSIS — Z7982 Long term (current) use of aspirin: Secondary | ICD-10-CM | POA: Insufficient documentation

## 2016-07-08 DIAGNOSIS — Z79899 Other long term (current) drug therapy: Secondary | ICD-10-CM | POA: Diagnosis not present

## 2016-07-08 DIAGNOSIS — M545 Low back pain: Secondary | ICD-10-CM | POA: Diagnosis present

## 2016-07-08 DIAGNOSIS — Z87891 Personal history of nicotine dependence: Secondary | ICD-10-CM | POA: Diagnosis not present

## 2016-07-08 LAB — URINALYSIS, ROUTINE W REFLEX MICROSCOPIC
Glucose, UA: NEGATIVE mg/dL
Hgb urine dipstick: NEGATIVE
Ketones, ur: NEGATIVE mg/dL
Leukocytes, UA: NEGATIVE
Nitrite: NEGATIVE
Protein, ur: NEGATIVE mg/dL
Specific Gravity, Urine: 1.025 (ref 1.005–1.030)
pH: 5.5 (ref 5.0–8.0)

## 2016-07-08 MED ORDER — KETOROLAC TROMETHAMINE 60 MG/2ML IM SOLN
60.0000 mg | Freq: Once | INTRAMUSCULAR | Status: DC
  Filled 2016-07-08: qty 2

## 2016-07-08 MED ORDER — TRAMADOL HCL 50 MG PO TABS
50.0000 mg | ORAL_TABLET | Freq: Once | ORAL | Status: AC
Start: 2016-07-08 — End: 2016-07-08
  Administered 2016-07-08: 50 mg via ORAL
  Filled 2016-07-08: qty 1

## 2016-07-08 MED ORDER — IBUPROFEN 800 MG PO TABS: 800.0000 mg | ORAL_TABLET | Freq: Three times a day (TID) | ORAL | 0 refills | Status: DC

## 2016-07-08 MED ORDER — METHOCARBAMOL 500 MG PO TABS: 500.0000 mg | ORAL_TABLET | Freq: Two times a day (BID) | ORAL | 0 refills | Status: DC

## 2016-07-08 MED ORDER — LIDOCAINE 5 % EX PTCH: 1.0000 | MEDICATED_PATCH | CUTANEOUS | 0 refills | Status: DC

## 2016-07-08 MED ORDER — PREDNISONE 20 MG PO TABS
60.0000 mg | ORAL_TABLET | Freq: Once | ORAL | Status: AC
  Administered 2016-07-08: 60 mg via ORAL
  Filled 2016-07-08: qty 3

## 2016-07-08 NOTE — ED Notes (Signed)
Pt provided with d/c instructions at this time.  Pt verbalizes understanding of d/c instructions as well as follow up procedure after d/c.  Pt provided with RX at time of d/c. Pt verbalizes understanding of RX directions. Pt in no apparent distress at this time.  Pt ambulatory at time of d/c.

## 2016-07-08 NOTE — ED Notes (Signed)
Pt refuses toradol at this time.  MD notified of pt's refusal of pain medication.

## 2016-07-08 NOTE — ED Provider Notes (Signed)
[facility] DEPT Provider Note   CSN: [id number] Arrival date & time: 07/08/16  [id number]  First Provider Contact:  First MD Initiated Contact with Patient 07/08/16 0404        History   Chief Complaint Chief Complaint  Patient presents with  . Back Pain    HPI Brianna Velez is a 40 y.o. female.  HPI    Brianna Velez is a 40 y.o. female, with a history of sciatica, presenting to the ED with acute on chronic left lower back pain radiating down the left leg for the past few days. Pt states she is here because she is out of tramadol. Has tried ibuprofen, tylenol, Goody Powder, and exercise without relief. Pt has been told in the past to secure a PCP, but has not done so. Denies falls/trauma, fever/chills, neuro deficits, or any other complaints.      Past Medical History:  Diagnosis Date  . Fibroids   . History of hiatal hernia   . Leg pain   . Migraine   . Sciatica     Patient Active Problem List   Diagnosis Date Noted  . Chronic pain syndrome 03/31/2016  . Screening for diabetes mellitus 12/31/2015  . S/P TAH (total abdominal hysterectomy) 09/29/2015  . Fibroid, uterine   . Menorrhagia with regular cycle   . Fibroid uterus 07/01/2015  . Left hip pain 11/06/2014    Past Surgical History:  Procedure Laterality Date  . ABDOMINAL HYSTERECTOMY  09/29/2015   Procedure: HYSTERECTOMY ABDOMINAL;  Surgeon: Mora Bellman, MD;  Location: [facility];  Service: Gynecology;;  . BILATERAL SALPINGECTOMY Bilateral 09/29/2015   Procedure: BILATERAL SALPINGECTOMY;  Surgeon: Mora Bellman, MD;  Location: [facility];  Service: Gynecology;  Laterality: Bilateral;  . TUBAL LIGATION    . WISDOM TOOTH EXTRACTION      OB History    Gravida Para Term Preterm AB Living   2 2 2  0 0 2   SAB TAB Ectopic Multiple Live Births   0 0 0 0         Home Medications    Prior to Admission medications   Medication Sig Start Date End Date Taking? Authorizing Provider  Aspirin-Salicylamide-Caffeine  (BC HEADACHE POWDER PO) Take 1 Package by mouth daily as needed (pain).    Historical Provider, MD  cyclobenzaprine (FLEXERIL) 10 MG tablet Take 1 tablet (10 mg total) by mouth 3 (three) times daily as needed for muscle spasms. Patient not taking: Reported on 06/23/2016 123XX123   Delora Fuel, MD  diclofenac sodium (VOLTAREN) 1 % GEL Apply 4 g topically 4 (four) times daily. Patient not taking: Reported on 06/23/2016 12/31/15   Tresa Garter, MD  ibuprofen (ADVIL,MOTRIN) 800 MG tablet Take 1 tablet (800 mg total) by mouth 3 (three) times daily. 07/08/16   Annarose Ouellet C Faheem Ziemann, PA-C  lidocaine (LIDODERM) 5 % Place 1 patch onto the skin daily. Remove & Discard patch within 12 hours or as directed by MD 07/08/16   Lorayne Bender, PA-C  methocarbamol (ROBAXIN) 500 MG tablet Take 1 tablet (500 mg total) by mouth 2 (two) times daily. 07/08/16   Amoni Scallan C Jaylei Fuerte, PA-C  naproxen (NAPROSYN) 500 MG tablet Take 1 tablet (500 mg total) by mouth 2 (two) times daily. Patient not taking: Reported on 06/23/2016 03/02/16   Charlesetta Shanks, MD  orphenadrine (NORFLEX) 100 MG tablet Take 1 tablet (100 mg total) by mouth 2 (two) times daily. Patient not taking: Reported on 06/23/2016 03/02/16   Charlesetta Shanks, MD  predniSONE (DELTASONE) 20 MG tablet Take 2 tablets (40 mg total) by mouth daily. 06/28/16   [name], PA-C  traMADol (ULTRAM) 50 MG tablet Take 1 tablet (50 mg total) by mouth every 6 (six) hours as needed. 06/30/16   [place], PA-C    Family History Family History  Problem Relation Age of Onset  . Diabetes Sister   . Heart disease Paternal Grandmother   . Hypertension Father   . Dementia Mother   . Hypertension Mother     Social History Social History  Substance Use Topics  . Smoking status: Former Research scientist (life sciences)  . Smokeless tobacco: Never Used  . Alcohol use No     Allergies   Review of patient's allergies indicates no known allergies.   Review of Systems Review of Systems  Constitutional: Negative for  chills and fever.  Gastrointestinal: Negative for abdominal pain, constipation, diarrhea, nausea and vomiting.  Genitourinary: Negative for dysuria, flank pain and hematuria.  Musculoskeletal: Positive for back pain and myalgias. Negative for neck pain.  Neurological: Negative for weakness and numbness.  All other systems reviewed and are negative.    Physical Exam Updated Vital Signs BP 102/65 (BP Location: Left Arm)   Pulse 77   Temp 97.7 F (36.5 C) (Oral)   Resp 18   LMP 08/15/2015 (Approximate)   SpO2 100%   Physical Exam  Constitutional: She appears well-developed and well-nourished. No distress.  HENT:  Head: Normocephalic and atraumatic.  Eyes: Conjunctivae are normal.  Neck: Neck supple.  Cardiovascular: Normal rate, regular rhythm and intact distal pulses.   Pulmonary/Chest: Effort normal. No respiratory distress.  Abdominal: Soft. There is no tenderness. There is no guarding.  Musculoskeletal: She exhibits no edema or tenderness.  Tenderness to the left lower back and buttocks. Full ROM in all extremities and spine. No midline spinal tenderness.   Neurological: She is alert. She has normal reflexes.  No sensory deficits. Strength 5/5 in all extremities. No gait disturbance. Coordination intact.   Skin: Skin is warm and dry. She is not diaphoretic.  Psychiatric: She has a normal mood and affect. Her behavior is normal.  Nursing note and vitals reviewed.    ED Treatments / Results  Labs (all labs ordered are listed, but only abnormal results are displayed) Labs Reviewed  URINALYSIS, ROUTINE W REFLEX MICROSCOPIC (NOT AT [facility]) - Abnormal; Notable for the following:       Result Value   APPearance CLOUDY (*)    Bilirubin Urine SMALL (*)    All other components within normal limits    EKG  EKG Interpretation None       Radiology No results found.  Procedures Procedures (including critical care time)  Medications Ordered in ED Medications  ketorolac  (TORADOL) injection 60 mg (60 mg Intramuscular Refused 07/08/16 0432)  traMADol (ULTRAM) tablet 50 mg (50 mg Oral Given 07/08/16 0432)  predniSONE (DELTASONE) tablet 60 mg (60 mg Oral Given 07/08/16 0432)     Initial Impression / Assessment and Plan / ED Course  I have reviewed the triage vital signs and the nursing notes.  Pertinent labs & imaging results that were available during my care of the patient were reviewed by me and considered in my medical decision making (see chart for details).  Clinical Course    Brianna Velez presents with left lower back pain radiating down the left leg for the last few days.  Patient has no neuro deficits. No red flag symptoms. Suspect exacerbation of her  chronic sciatica. Patient refused Toradol. Advised patient to secure care with a PCP. She has been told to do this multiple times, but has not done so. Further resources provided. Patient ambulated here in the ED without difficulty. I did not refill the patient's tramadol, as she requested, as I believe it is inappropriate for her to be using the ED for controlled substance renewals. The patient was given instructions for home care as well as return precautions. Patient voices understanding of these instructions and accepts the plan.  Vitals:   07/08/16 0403 07/08/16 0404 07/08/16 0407 07/08/16 0430  BP: 107/69  109/79 99/73  Pulse:  74 79 70  Resp:   18   Temp:      TempSrc:      SpO2:  100% 100% 100%     Final Clinical Impressions(s) / ED Diagnoses   Final diagnoses:  Left-sided low back pain with left-sided sciatica    New Prescriptions Discharge Medication List as of 07/08/2016  5:31 AM    START taking these medications   Details  lidocaine (LIDODERM) 5 % Place 1 patch onto the skin daily. Remove & Discard patch within 12 hours or as directed by MD, Starting Fri 07/08/2016, Print    methocarbamol (ROBAXIN) 500 MG tablet Take 1 tablet (500 mg total) by mouth 2 (two) times daily., Starting Fri  07/08/2016, Print         Lorayne Bender, PA-C 07/08/16 [place], MD 07/08/16 [phone]

## 2016-07-08 NOTE — Discharge Instructions (Signed)
You have been seen today for lower back and leg pain. Refilling controlled pain medications is not indicated in the ED. Please establish care with a PCP for chronic management of this issue. Resources are provided on this discharge paperwork to assist you in securing a PCP. [facility] and [facility] has walk-in hours every Thursday morning at 9 AM. May use ibuprofen or naproxen for pain.

## 2016-07-08 NOTE — ED Notes (Signed)
Pt able to ambulate with minimal discomfort.  

## 2016-07-08 NOTE — ED Triage Notes (Addendum)
Pt. reports left lower back pain ( sciatica)  radiating to left leg onset this week , denies injury/ambulatory.

## 2016-07-12 ENCOUNTER — Emergency Department (HOSPITAL_COMMUNITY)
Admission: EM | Admit: 2016-07-12 | Discharge: 2016-07-12 | Disposition: A | Discharge status: Home / Self Care | Payer: Medicaid Other | Attending: Emergency Medicine | Admitting: Emergency Medicine

## 2016-07-12 ENCOUNTER — Encounter (HOSPITAL_COMMUNITY): Payer: Self-pay | Admitting: Emergency Medicine

## 2016-07-12 DIAGNOSIS — Z7982 Long term (current) use of aspirin: Secondary | ICD-10-CM | POA: Insufficient documentation

## 2016-07-12 DIAGNOSIS — Z87891 Personal history of nicotine dependence: Secondary | ICD-10-CM | POA: Diagnosis not present

## 2016-07-12 DIAGNOSIS — M545 Low back pain: Secondary | ICD-10-CM | POA: Diagnosis not present

## 2016-07-12 DIAGNOSIS — M25552 Pain in left hip: Secondary | ICD-10-CM

## 2016-07-12 DIAGNOSIS — G894 Chronic pain syndrome: Secondary | ICD-10-CM

## 2016-07-12 MED ORDER — TRAMADOL HCL 50 MG PO TABS: 50.0000 mg | ORAL_TABLET | Freq: Four times a day (QID) | ORAL | 0 refills | Status: DC | PRN

## 2016-07-12 MED ORDER — TRAMADOL HCL 50 MG PO TABS
50.0000 mg | ORAL_TABLET | Freq: Once | ORAL | Status: AC
  Administered 2016-07-12: 50 mg via ORAL
  Filled 2016-07-12: qty 1

## 2016-07-12 NOTE — ED Triage Notes (Signed)
Pt. reports sciatica pain at left lower back radiating to left leg onset last week unrelieved by OTC pain medications , ambulatory / denies injury or fall .

## 2016-07-12 NOTE — ED Provider Notes (Signed)
TIME SEEN: 4:00 am  CHIEF COMPLAINT: sciatica  HPI: patient comes to the ER for treatment of her right lower back pain. She has chronic pain here and has had numeral visits to the ED for the same thing. She has not yet had a PCP or pain management doctor at previous visits but today she says she is scheduled to see a Pain Management Physician on August 29.   She says the pain is her usual pain,  Radiating down her left leg. She has been out of tramadol for a week and has been trying OTC medications but it has not help.  ROS: See HPI Constitutional: no fever  Eyes: no drainage  ENT: no runny nose   Cardiovascular:  no chest pain  Resp: no SOB  GI: no vomiting GU: no dysuria Integumentary: no rash  Allergy: no hives  Musculoskeletal: no leg swelling  Neurological: no slurred speech ROS otherwise negative  PAST MEDICAL HISTORY/PAST SURGICAL HISTORY:  Past Medical History:  Diagnosis Date  . Fibroids   . History of hiatal hernia   . Leg pain   . Migraine   . Sciatica     MEDICATIONS:  Prior to Admission medications   Medication Sig Start Date End Date Taking? Authorizing Provider  Aspirin-Salicylamide-Caffeine (BC HEADACHE POWDER PO) Take 1 Package by mouth daily as needed (pain).    Historical Provider, MD  cyclobenzaprine (FLEXERIL) 10 MG tablet Take 1 tablet (10 mg total) by mouth 3 (three) times daily as needed for muscle spasms. Patient not taking: Reported on 06/23/2016 123XX123   Delora Fuel, MD  diclofenac sodium (VOLTAREN) 1 % GEL Apply 4 g topically 4 (four) times daily. Patient not taking: Reported on 06/23/2016 12/31/15   Tresa Garter, MD  ibuprofen (ADVIL,MOTRIN) 800 MG tablet Take 1 tablet (800 mg total) by mouth 3 (three) times daily. 07/08/16   Shawn C Joy, PA-C  lidocaine (LIDODERM) 5 % Place 1 patch onto the skin daily. Remove & Discard patch within 12 hours or as directed by MD 07/08/16   Lorayne Bender, PA-C  methocarbamol (ROBAXIN) 500 MG tablet Take 1 tablet  (500 mg total) by mouth 2 (two) times daily. 07/08/16   Shawn C Joy, PA-C  naproxen (NAPROSYN) 500 MG tablet Take 1 tablet (500 mg total) by mouth 2 (two) times daily. Patient not taking: Reported on 06/23/2016 03/02/16   Charlesetta Shanks, MD  orphenadrine (NORFLEX) 100 MG tablet Take 1 tablet (100 mg total) by mouth 2 (two) times daily. Patient not taking: Reported on 06/23/2016 03/02/16   Charlesetta Shanks, MD  predniSONE (DELTASONE) 20 MG tablet Take 2 tablets (40 mg total) by mouth daily. 06/28/16   [name], PA-C  traMADol (ULTRAM) 50 MG tablet Take 1 tablet (50 mg total) by mouth every 6 (six) hours as needed. 06/30/16   Ozella Almond Ward, PA-C  traMADol (ULTRAM) 50 MG tablet Take 1 tablet (50 mg total) by mouth every 6 (six) hours as needed. 07/12/16   Delos Haring, PA-C    ALLERGIES:  No Known Allergies  SOCIAL HISTORY:  Social History  Substance Use Topics  . Smoking status: Former Research scientist (life sciences)  . Smokeless tobacco: Never Used  . Alcohol use No    FAMILY HISTORY: Family History  Problem Relation Age of Onset  . Diabetes Sister   . Heart disease Paternal Grandmother   . Hypertension Father   . Dementia Mother   . Hypertension Mother     EXAM: BP 115/82  Pulse 73   Temp 98.1 F (36.7 C) (Oral)   Resp 14   Ht 5\' 4"  (1.626 m)   Wt 43.7 kg   LMP 08/15/2015 (Approximate)   SpO2 100%   BMI 16.55 kg/m  CONSTITUTIONAL: Alert and oriented and responds appropriately to questions. Well-appearing; well-nourished HEAD: Normocephalic EYES: Conjunctivae clear, PERRL ENT: normal nose; no rhinorrhea; moist mucous membranes NECK: Supple, no meningismus, no LAD  CARD: RRR; S1 and S2 appreciated; no murmurs, no clicks, no rubs, no gallops RESP: Normal chest excursion without splinting or tachypnea; breath sounds clear and equal bilaterally; no wheezes, no rhonchi, no rales, no hypoxia or respiratory distress, speaking full sentences ABD/GI: Normal bowel sounds; non-distended; soft,  non-tender, no rebound, no guarding, no peritoneal signs BACK:  The back appears normal and is non-tender to palpation, there is no CVA tenderness EXT: Normal ROM in all joints;no edema; normal capillary refill; no cyanosis, no calf tenderness or swelling; she has tenderness to the right paraspinal and midline lumbar region. She has intact sensation and strength to her bilateral LE. SKIN: Normal color for age and race; warm; no rash NEURO: Moves all extremities equally, sensation to light touch intact diffusely, cranial nerves II through XII intact PSYCH: The patient's mood and manner are appropriate. Grooming and personal hygiene are appropriate.  MEDICAL DECISION MAKING:   40 y.o.Brianna Velez's  with back pain.   No neurological deficits and normal neuro exam. No loss of bowel or bladder control. No concern for cauda equina at this time base on HPI and physical exam findings. No fever, night sweats, weight loss, h/o cancer, IVDU. The patient can walk with some discomfort.   Patient Plan 1. Medications: NSAIDs and/or muscle relaxer recommended- she declined as they do not work. 2. Patient has a [facility] scheduled for Aug 29, she has been told that she will get an rx for 10 Tramadol but it will have to last her until her appt on the 29th as we WILL NOT refill this rx for her in the ED anymore. 3. Treatment: rest, drink plenty of fluids, gentle stretching as discussed, alternate ice and heat   Results for orders placed or performed during the hospital encounter of 07/08/16  Urinalysis, Routine w reflex microscopic (not at [facility])  Result Value Ref Range   Color, Urine YELLOW YELLOW   APPearance CLOUDY (A) CLEAR   Specific Gravity, Urine 1.025 1.005 - 1.030   pH 5.5 5.0 - 8.0   Glucose, UA NEGATIVE NEGATIVE mg/dL   Hgb urine dipstick NEGATIVE NEGATIVE   Bilirubin Urine SMALL (A) NEGATIVE   Ketones, ur NEGATIVE NEGATIVE mg/dL   Protein, ur NEGATIVE NEGATIVE mg/dL    Nitrite NEGATIVE NEGATIVE   Leukocytes, UA NEGATIVE NEGATIVE       Delos Haring, PA-C 07/12/16 [id number]    Everlene Balls, MD 07/12/16 [phone]

## 2016-07-19 ENCOUNTER — Emergency Department (HOSPITAL_COMMUNITY)
Admission: EM | Admit: 2016-07-19 | Discharge: 2016-07-20 | Disposition: A | Discharge status: Home / Self Care | Payer: Medicaid Other | Attending: Emergency Medicine | Admitting: Emergency Medicine

## 2016-07-19 ENCOUNTER — Encounter (HOSPITAL_COMMUNITY): Payer: Self-pay | Admitting: Emergency Medicine

## 2016-07-19 DIAGNOSIS — M79605 Pain in left leg: Secondary | ICD-10-CM | POA: Insufficient documentation

## 2016-07-19 DIAGNOSIS — Z7982 Long term (current) use of aspirin: Secondary | ICD-10-CM | POA: Diagnosis not present

## 2016-07-19 DIAGNOSIS — Z87891 Personal history of nicotine dependence: Secondary | ICD-10-CM | POA: Insufficient documentation

## 2016-07-19 DIAGNOSIS — Z791 Long term (current) use of non-steroidal anti-inflammatories (NSAID): Secondary | ICD-10-CM | POA: Insufficient documentation

## 2016-07-19 DIAGNOSIS — M549 Dorsalgia, unspecified: Secondary | ICD-10-CM | POA: Insufficient documentation

## 2016-07-19 MED ORDER — LIDOCAINE 5 % EX PTCH
1.0000 | MEDICATED_PATCH | CUTANEOUS | Status: DC
  Administered 2016-07-20: 1 via TRANSDERMAL
  Filled 2016-07-19: qty 1

## 2016-07-19 NOTE — Discharge Instructions (Signed)
You can purchase Salon Pas Lidocain patches at [facility]( order on line 3.88 for 20 patches)  [facility] 9.99 for 60 patches YOu can use these until seen by Pain clinic

## 2016-07-19 NOTE — ED Triage Notes (Signed)
Pt states she has sciatic pain and it radiates down her left leg  Pt states she was prescribed a lidocaine patch but cannot afford it so she has been using some rub on cream and ibuprofen but the pain is so bad she "needs something to take the edge off"

## 2016-07-19 NOTE — ED Provider Notes (Signed)
[name] DEPT Provider Note   CSN: [id number] Arrival date & time: 07/19/16  2022  By signing my name below, I, Reola Mosher, attest that this documentation has been prepared under the direction and in the presence of Junius Creamer, [place]. Electronically Signed: Reola Mosher, ED Scribe. 07/19/16. 11:04 PM.  History   Chief Complaint Chief Complaint  Patient presents with  . Leg Pain   The history is provided by the patient. No language interpreter was used.   HPI Comments: Brianna Velez is a 40 y.o. female with a PMhx significant of sciatica and chronic pain syndrome, who presents to the Emergency Department complaining of gradual onset, gradually worsening, constant, acute on chronic, 10/10 shooting lower back pain that radiates into her bilateral legs onset PTA. Pt reports that she has a hx of similar pain with sciatica and was recently given an rx for Tramadol on 07/06/16, but states that she doesn't want anymore Tramadol whatsoever. She was also given an rx for Lidocaine patches from her last visit to the ED on 07/12/16, but has not filled her rx secondary to financial issues. Her pain is exacerbated with movement. Pt has been applying "rub on creams" and taking ibuprofen with minimal relief of her pain. Denies numbness, or any other associated symptoms.   Past Medical History:  Diagnosis Date  . Fibroids   . History of hiatal hernia   . Leg pain   . Migraine   . Sciatica    Patient Active Problem List   Diagnosis Date Noted  . Chronic pain syndrome 03/31/2016  . Screening for diabetes mellitus 12/31/2015  . S/P TAH (total abdominal hysterectomy) 09/29/2015  . Fibroid, uterine   . Menorrhagia with regular cycle   . Fibroid uterus 07/01/2015  . Left hip pain 11/06/2014   Past Surgical History:  Procedure Laterality Date  . ABDOMINAL HYSTERECTOMY  09/29/2015   Procedure: HYSTERECTOMY ABDOMINAL;  Surgeon: Mora Bellman, MD;  Location: [facility];  Service:  Gynecology;;  . BILATERAL SALPINGECTOMY Bilateral 09/29/2015   Procedure: BILATERAL SALPINGECTOMY;  Surgeon: Mora Bellman, MD;  Location: [facility];  Service: Gynecology;  Laterality: Bilateral;  . TUBAL LIGATION    . WISDOM TOOTH EXTRACTION     OB History    Gravida Para Term Preterm AB Living   2 2 2  0 0 2   SAB TAB Ectopic Multiple Live Births   0 0 0 0       Home Medications    Prior to Admission medications   Medication Sig Start Date End Date Taking? Authorizing Provider  Aspirin-Salicylamide-Caffeine (BC HEADACHE POWDER PO) Take 1 Package by mouth daily as needed (pain).    Historical Provider, MD  cyclobenzaprine (FLEXERIL) 10 MG tablet Take 1 tablet (10 mg total) by mouth 3 (three) times daily as needed for muscle spasms. Patient not taking: Reported on 06/23/2016 123XX123   Delora Fuel, MD  diclofenac sodium (VOLTAREN) 1 % GEL Apply 4 g topically 4 (four) times daily. Patient not taking: Reported on 06/23/2016 12/31/15   Tresa Garter, MD  ibuprofen (ADVIL,MOTRIN) 800 MG tablet Take 1 tablet (800 mg total) by mouth 3 (three) times daily. 07/08/16   Shawn C Joy, PA-C  lidocaine (LIDODERM) 5 % Place 1 patch onto the skin daily. Remove & Discard patch within 12 hours or as directed by MD 07/08/16   Lorayne Bender, PA-C  methocarbamol (ROBAXIN) 500 MG tablet Take 1 tablet (500 mg total) by mouth 2 (two) times daily. 07/08/16  Shawn C Joy, PA-C  naproxen (NAPROSYN) 500 MG tablet Take 1 tablet (500 mg total) by mouth 2 (two) times daily. Patient not taking: Reported on 06/23/2016 03/02/16   Charlesetta Shanks, MD  orphenadrine (NORFLEX) 100 MG tablet Take 1 tablet (100 mg total) by mouth 2 (two) times daily. Patient not taking: Reported on 06/23/2016 03/02/16   Charlesetta Shanks, MD  predniSONE (DELTASONE) 20 MG tablet Take 2 tablets (40 mg total) by mouth daily. 06/28/16   [name], PA-C  traMADol (ULTRAM) 50 MG tablet Take 1 tablet (50 mg total) by mouth every 6 (six) hours as needed. 06/30/16    Ozella Almond Ward, PA-C  traMADol (ULTRAM) 50 MG tablet Take 1 tablet (50 mg total) by mouth every 6 (six) hours as needed. 07/12/16   Delos Haring, PA-C   Family History Family History  Problem Relation Age of Onset  . Diabetes Sister   . Heart disease Paternal Grandmother   . Hypertension Father   . Dementia Mother   . Hypertension Mother    Social History Social History  Substance Use Topics  . Smoking status: Former Research scientist (life sciences)  . Smokeless tobacco: Never Used  . Alcohol use No   Allergies   Review of patient's allergies indicates no known allergies.  Review of Systems Review of Systems  Musculoskeletal: Positive for back pain and myalgias.  Neurological: Negative for numbness.  All other systems reviewed and are negative.  Physical Exam Updated Vital Signs BP 114/82 (BP Location: Left Arm)   Pulse 86   Temp 97.5 F (36.4 C) (Oral)   Resp 16   Ht 5\' 3"  (1.6 m)   Wt 98 lb (44.5 kg)   LMP 08/15/2015 (Approximate)   SpO2 100%   BMI 17.36 kg/m   Physical Exam  Constitutional: She appears well-developed and well-nourished.  HENT:  Head: Normocephalic.  Eyes: Conjunctivae are normal.  Cardiovascular: Normal rate.   Pulmonary/Chest: Effort normal. No respiratory distress.  Abdominal: She exhibits no distension.  Musculoskeletal: Normal range of motion.  Neurological: She is alert.  Skin: Skin is warm and dry.  Psychiatric: She has a normal mood and affect. Her behavior is normal.  Nursing note and vitals reviewed.  ED Treatments / Results  DIAGNOSTIC STUDIES: Oxygen Saturation is 100% on RA, normal by my interpretation.   COORDINATION OF CARE: 11:04 PM-Discussed next steps with pt. Pt verbalized understanding and is agreeable with the plan.   Labs (all labs ordered are listed, but only abnormal results are displayed) Labs Reviewed - No data to display  EKG  EKG Interpretation None       Radiology No results found.  Procedures Procedures  (including critical care time)  Medications Ordered in ED Medications - No data to display  Initial Impression / Assessment and Plan / ED Course  I have reviewed the triage vital signs and the nursing notes.  Pertinent labs & imaging results that were available during my care of the patient were reviewed by me and considered in my medical decision making (see chart for details).  Clinical Course     Lidocaine Patch placed patient informed that she can purchase Salon Pas Lidocaine patches at [facility] or [facility] for a reasonable price   Final Clinical Impressions(s) / ED Diagnoses   Final diagnoses:  None    New Prescriptions New Prescriptions   No medications on file  I personally performed the services described in this documentation, which was scribed in my presence. The recorded information has been  reviewed and is accurate.   Junius Creamer, NP [phone] [id number]    David Glick, MD [id number]

## 2016-08-02 ENCOUNTER — Ambulatory Visit: Payer: Medicaid Other | Admitting: Pain Medicine

## 2016-09-12 ENCOUNTER — Encounter (HOSPITAL_COMMUNITY): Payer: Self-pay

## 2016-09-12 ENCOUNTER — Emergency Department (HOSPITAL_COMMUNITY)
Admission: EM | Admit: 2016-09-12 | Discharge: 2016-09-12 | Disposition: A | Discharge status: Home / Self Care | Payer: Medicaid Other | Attending: Physician Assistant | Admitting: Physician Assistant

## 2016-09-12 ENCOUNTER — Emergency Department (HOSPITAL_COMMUNITY): Payer: Medicaid Other

## 2016-09-12 DIAGNOSIS — Z7982 Long term (current) use of aspirin: Secondary | ICD-10-CM | POA: Diagnosis not present

## 2016-09-12 DIAGNOSIS — S63501A Unspecified sprain of right wrist, initial encounter: Secondary | ICD-10-CM | POA: Insufficient documentation

## 2016-09-12 DIAGNOSIS — X501XXA Overexertion from prolonged static or awkward postures, initial encounter: Secondary | ICD-10-CM | POA: Diagnosis not present

## 2016-09-12 DIAGNOSIS — S6991XA Unspecified injury of right wrist, hand and finger(s), initial encounter: Secondary | ICD-10-CM | POA: Diagnosis present

## 2016-09-12 DIAGNOSIS — Y929 Unspecified place or not applicable: Secondary | ICD-10-CM | POA: Insufficient documentation

## 2016-09-12 DIAGNOSIS — Y939 Activity, unspecified: Secondary | ICD-10-CM | POA: Diagnosis not present

## 2016-09-12 DIAGNOSIS — Z87891 Personal history of nicotine dependence: Secondary | ICD-10-CM | POA: Diagnosis not present

## 2016-09-12 DIAGNOSIS — Y999 Unspecified external cause status: Secondary | ICD-10-CM | POA: Diagnosis not present

## 2016-09-12 MED ORDER — IBUPROFEN 600 MG PO TABS: 600.0000 mg | ORAL_TABLET | Freq: Four times a day (QID) | ORAL | 0 refills | Status: DC | PRN

## 2016-09-12 MED ORDER — IBUPROFEN 400 MG PO TABS
400.0000 mg | ORAL_TABLET | Freq: Once | ORAL | Status: AC
Start: 2016-09-12 — End: 2016-09-12
  Administered 2016-09-12: 400 mg via ORAL
  Filled 2016-09-12: qty 1

## 2016-09-12 NOTE — ED Notes (Signed)
Declined W/C at D/C and was escorted to lobby by RN. 

## 2016-09-12 NOTE — ED Provider Notes (Signed)
[facility] DEPT Provider Note   CSN: [id number] Arrival date & time: 09/12/16  1159 By signing my name below, I, Georgette Shell, attest that this documentation has been prepared under the direction and in the presence of [facility], PA-C. Electronically Signed: Georgette Shell, ED Scribe. 09/12/16. 12:34 PM.  History   Chief Complaint Chief Complaint  Patient presents with  . Wrist Pain   HPI Comments: Brianna Velez is a 40 y.o. female who presents to the Emergency Department complaining of 10/10 right wrist pain with swelling onset this morning. She reports she was combing her hair when she felt a "strong sensation of pain" shoot through her right hand and wrist. Pt states pain is exacerbated with movement. She has not tried any OTC medications PTA. Pt denies fever, numbness, weakness or any other associated symptoms.   The history is provided by the patient. No language interpreter was used.    Past Medical History:  Diagnosis Date  . Fibroids   . History of hiatal hernia   . Leg pain   . Migraine   . Sciatica     Patient Active Problem List   Diagnosis Date Noted  . Chronic pain syndrome 03/31/2016  . Screening for diabetes mellitus 12/31/2015  . S/P TAH (total abdominal hysterectomy) 09/29/2015  . Fibroid, uterine   . Menorrhagia with regular cycle   . Fibroid uterus 07/01/2015  . Left hip pain 11/06/2014    Past Surgical History:  Procedure Laterality Date  . ABDOMINAL HYSTERECTOMY  09/29/2015   Procedure: HYSTERECTOMY ABDOMINAL;  Surgeon: Mora Bellman, MD;  Location: [facility];  Service: Gynecology;;  . BILATERAL SALPINGECTOMY Bilateral 09/29/2015   Procedure: BILATERAL SALPINGECTOMY;  Surgeon: Mora Bellman, MD;  Location: [facility];  Service: Gynecology;  Laterality: Bilateral;  . TUBAL LIGATION    . WISDOM TOOTH EXTRACTION      OB History    Gravida Para Term Preterm AB Living   2 2 2  0 0 2   SAB TAB Ectopic Multiple Live Births   0 0 0 0         Home  Medications    Prior to Admission medications   Medication Sig Start Date End Date Taking? Authorizing Provider  Aspirin-Salicylamide-Caffeine (BC HEADACHE POWDER PO) Take 1 Package by mouth daily as needed (pain).    Historical Provider, MD  cyclobenzaprine (FLEXERIL) 10 MG tablet Take 1 tablet (10 mg total) by mouth 3 (three) times daily as needed for muscle spasms. Patient not taking: Reported on 06/23/2016 123XX123   Delora Fuel, MD  diclofenac sodium (VOLTAREN) 1 % GEL Apply 4 g topically 4 (four) times daily. Patient not taking: Reported on 06/23/2016 12/31/15   Tresa Garter, MD  ibuprofen (ADVIL,MOTRIN) 600 MG tablet Take 1 tablet (600 mg total) by mouth every 6 (six) hours as needed. 09/12/16   Ozella Almond Ward, PA-C  lidocaine (LIDODERM) 5 % Place 1 patch onto the skin daily. Remove & Discard patch within 12 hours or as directed by MD 07/08/16   Lorayne Bender, PA-C  methocarbamol (ROBAXIN) 500 MG tablet Take 1 tablet (500 mg total) by mouth 2 (two) times daily. 07/08/16   Shawn C Joy, PA-C  naproxen (NAPROSYN) 500 MG tablet Take 1 tablet (500 mg total) by mouth 2 (two) times daily. Patient not taking: Reported on 06/23/2016 03/02/16   Charlesetta Shanks, MD  orphenadrine (NORFLEX) 100 MG tablet Take 1 tablet (100 mg total) by mouth 2 (two) times daily. Patient not taking: Reported  on 06/23/2016 03/02/16   Charlesetta Shanks, MD  predniSONE (DELTASONE) 20 MG tablet Take 2 tablets (40 mg total) by mouth daily. 06/28/16   [name], PA-C  traMADol (ULTRAM) 50 MG tablet Take 1 tablet (50 mg total) by mouth every 6 (six) hours as needed. 06/30/16   Ozella Almond Ward, PA-C  traMADol (ULTRAM) 50 MG tablet Take 1 tablet (50 mg total) by mouth every 6 (six) hours as needed. 07/12/16   Delos Haring, PA-C    Family History Family History  Problem Relation Age of Onset  . Diabetes Sister   . Heart disease Paternal Grandmother   . Hypertension Father   . Dementia Mother   . Hypertension Mother      Social History Social History  Substance Use Topics  . Smoking status: Former Research scientist (life sciences)  . Smokeless tobacco: Never Used  . Alcohol use No     Allergies   Review of patient's allergies indicates no known allergies.   Review of Systems Review of Systems  Constitutional: Negative for fever.  Musculoskeletal: Positive for arthralgias and myalgias.  Neurological: Negative for weakness and numbness.     Physical Exam Updated Vital Signs BP 96/63 (BP Location: Right Arm)   Pulse 81   Temp 98.2 F (36.8 C) (Oral)   Resp 18   Ht 5\' 3"  (1.6 m)   Wt 44.5 kg   LMP 08/15/2015 (Approximate)   SpO2 100%   BMI 17.36 kg/m   Physical Exam  Constitutional: She is oriented to person, place, and time. She appears well-developed and well-nourished. No distress.  HENT:  Head: Normocephalic and atraumatic.  Cardiovascular: Normal rate, regular rhythm and normal heart sounds.   Pulmonary/Chest: Effort normal and breath sounds normal. No respiratory distress.  Musculoskeletal:  Right hand with no gross deformity noted. Patient has decreased ROM 2/2 pain. Good grip strength and pincer grasp. There is no joint effusion noted. No erythema or warmth overlaying the joint. There is tenderness to palpation over both the dorsal and palmar aspect of the wrist. The patient has normal sensation and motor function in the median, ulnar, and radial nerve distributions. There is no anatomic snuff box tenderness. The patient has normal active and passive range of motion of their digits. 2+ radial pulse.  Neurological: She is alert and oriented to person, place, and time.  Skin: Skin is warm and dry.  Nursing note and vitals reviewed.    ED Treatments / Results  DIAGNOSTIC STUDIES: Oxygen Saturation is 100% on RA, normal by my interpretation.    COORDINATION OF CARE: 12:33 PM Discussed treatment plan with pt at bedside which includes x-ray and pt agreed to plan.  Labs (all labs ordered are listed,  but only abnormal results are displayed) Labs Reviewed - No data to display  EKG  EKG Interpretation None       Radiology Dg Wrist Complete Right  Result Date: 09/12/2016 CLINICAL DATA:  Generalized right wrist pain after moving his weekend. Initial encounter. EXAM: RIGHT WRIST - COMPLETE 3+ VIEW COMPARISON:  None. FINDINGS: No acute osseous or joint abnormality. IMPRESSION: Negative. Electronically Signed   By: Lorin Picket M.D.   On: 09/12/2016 13:37    Procedures Procedures (including critical care time)  Medications Ordered in ED Medications  ibuprofen (ADVIL,MOTRIN) tablet 400 mg (400 mg Oral Given 09/12/16 1254)     Initial Impression / Assessment and Plan / ED Course  I have reviewed the triage vital signs and the nursing notes.  Pertinent labs &  imaging results that were available during my care of the patient were reviewed by me and considered in my medical decision making (see chart for details).  Clinical Course   Brianna Velez is a 40 y.o. female who presents to ED for right wrist pain that began today while combing her hair. And exam, right upper extremity is neurovascularly intact with full range of motion and strength. X-ray obtained and negative. Velcro splint applied. Orthopedic follow-up if symptoms persist. Reasons to return to ED discussed and all questions answered.  Final Clinical Impressions(s) / ED Diagnoses   Final diagnoses:  Sprain of right wrist, initial encounter   I personally performed the services described in this documentation, which was scribed in my presence. The recorded information has been reviewed and is accurate.  New Prescriptions New Prescriptions   IBUPROFEN (ADVIL,MOTRIN) 600 MG TABLET    Take 1 tablet (600 mg total) by mouth every 6 (six) hours as needed.     Ozella Almond Ward, PA-C 09/12/16 1401    Courteney Julio Alm, MD 09/13/16 1017

## 2016-09-12 NOTE — ED Triage Notes (Signed)
Patient complains of right wrist pain while combing hair this am, pain with any movement

## 2016-09-12 NOTE — ED Triage Notes (Signed)
PT reports while combing her Hair this morning she felt a strong  Pain sensation go thur her RT hand and wrist.

## 2016-09-12 NOTE — Discharge Instructions (Signed)
Ibuprofen as needed for pain. Ice and elevate for additional pain relief. If symptoms persist longer than 1 week, please follow-up with the orthopedic physician listed. Return to ER for new or worsening symptoms, any additional concerns.

## 2016-10-10 ENCOUNTER — Encounter (HOSPITAL_COMMUNITY): Payer: Self-pay | Admitting: Emergency Medicine

## 2016-10-10 DIAGNOSIS — Z87891 Personal history of nicotine dependence: Secondary | ICD-10-CM | POA: Insufficient documentation

## 2016-10-10 DIAGNOSIS — N938 Other specified abnormal uterine and vaginal bleeding: Secondary | ICD-10-CM | POA: Insufficient documentation

## 2016-10-10 DIAGNOSIS — Z7982 Long term (current) use of aspirin: Secondary | ICD-10-CM | POA: Diagnosis not present

## 2016-10-10 DIAGNOSIS — Z5321 Procedure and treatment not carried out due to patient leaving prior to being seen by health care provider: Secondary | ICD-10-CM | POA: Diagnosis not present

## 2016-10-10 LAB — URINE MICROSCOPIC-ADD ON

## 2016-10-10 LAB — URINALYSIS, ROUTINE W REFLEX MICROSCOPIC
Bilirubin Urine: NEGATIVE
Glucose, UA: NEGATIVE mg/dL
Ketones, ur: NEGATIVE mg/dL
Nitrite: POSITIVE — AB
Protein, ur: 100 mg/dL — AB
Specific Gravity, Urine: 1.017 (ref 1.005–1.030)
pH: 6.5 (ref 5.0–8.0)

## 2016-10-10 LAB — POC URINE PREG, ED: Preg Test, Ur: NEGATIVE

## 2016-10-10 NOTE — ED Triage Notes (Signed)
Pt presents to ED for assessment of sudden blood noted in toilet and on tissue while urinating earlier.  Pt has had a full hysterectomy.  Pt cannot determine if blood was in urine or vagina.  Pt denies any recent abdominal pain.  Denies any new sexual partners.  Pt does c/o of burning with urination.

## 2016-10-11 ENCOUNTER — Emergency Department (HOSPITAL_COMMUNITY)
Admission: EM | Admit: 2016-10-11 | Discharge: 2016-10-11 | Disposition: A | Discharge status: Home / Self Care | Payer: Medicaid Other | Attending: Emergency Medicine | Admitting: Emergency Medicine

## 2016-10-11 NOTE — ED Notes (Signed)
Pt called for room multiple times no answer

## 2016-11-18 ENCOUNTER — Emergency Department (HOSPITAL_COMMUNITY)
Admission: EM | Admit: 2016-11-18 | Discharge: 2016-11-18 | Disposition: A | Discharge status: Home / Self Care | Payer: Medicaid Other | Attending: Emergency Medicine | Admitting: Emergency Medicine

## 2016-11-18 ENCOUNTER — Encounter (HOSPITAL_COMMUNITY): Payer: Self-pay

## 2016-11-18 DIAGNOSIS — M5442 Lumbago with sciatica, left side: Secondary | ICD-10-CM | POA: Insufficient documentation

## 2016-11-18 DIAGNOSIS — M5432 Sciatica, left side: Secondary | ICD-10-CM

## 2016-11-18 DIAGNOSIS — Z7982 Long term (current) use of aspirin: Secondary | ICD-10-CM | POA: Insufficient documentation

## 2016-11-18 DIAGNOSIS — Z87891 Personal history of nicotine dependence: Secondary | ICD-10-CM | POA: Insufficient documentation

## 2016-11-18 DIAGNOSIS — M79605 Pain in left leg: Secondary | ICD-10-CM | POA: Diagnosis present

## 2016-11-18 MED ORDER — PREDNISONE 10 MG (21) PO TBPK: 10.0000 mg | ORAL_TABLET | Freq: Every day | ORAL | 0 refills | Status: DC

## 2016-11-18 MED ORDER — IBUPROFEN 600 MG PO TABS: 600.0000 mg | ORAL_TABLET | Freq: Three times a day (TID) | ORAL | 0 refills | Status: DC

## 2016-11-18 MED ORDER — METHOCARBAMOL 500 MG PO TABS: 500.0000 mg | ORAL_TABLET | Freq: Two times a day (BID) | ORAL | 0 refills | Status: DC

## 2016-11-18 MED ORDER — LIDOCAINE 5 % EX PTCH: 1.0000 | MEDICATED_PATCH | CUTANEOUS | 0 refills | Status: DC

## 2016-11-18 NOTE — Discharge Instructions (Signed)
Take it easy, but do not lay around too much as this may make the stiffness worse. Take 220 mg of naproxen every 12 hours or 600 mg of ibuprofen every 8 hours for the next 3 days. Take these medications with food to avoid upset stomach. Robaxin is a muscle relaxer and may help loosen stiff muscles. Do not take the Robaxin while driving or performing other dangerous activities. Be sure to perform the attached exercises starting with three times a week and working up to performing them daily. This is an essential part of preventing long term problems. Follow up with a primary care provider for any future management of these complaints.

## 2016-11-18 NOTE — ED Triage Notes (Signed)
Pt presents for evaluation of L leg sciatica flare up. Pt. Reports pain started yesterday, she took ibuprofen and muscle relaxer but pain is back. Pt. Ambulatory on arrival.

## 2016-11-18 NOTE — ED Provider Notes (Signed)
[facility] DEPT Provider Note   CSN: [id number] Arrival date & time: 11/18/16  1219   By signing my name below, I, Neta Mends, attest that this documentation has been prepared under the direction and in the presence of Ingris Pasquarella, PA-C. Electronically Signed: Neta Mends, ED Scribe. 11/18/2016. 1:15 PM.   History   Chief Complaint Chief Complaint  Patient presents with  . Leg Pain    The history is provided by the patient. No language interpreter was used.   HPI Comments:  Brianna Velez is a 40 y.o. female with PMHx of sciatica who presents to the Emergency Department complaining of constant left leg pain x 1 days. Pain begins in the left buttock and extends down the left upper leg. Pt states that this pain is similar to previous sciatica flare ups. Pt notes that the pain is aching and moderate in intensity. Pt states that she used to take tramadol for this pain but does not want to take it anymore. Pt has taken ibuprofen and a muscle relaxer with moderate temporary relief. Denies neuro deficits, trauma, or any other complaints.     Past Medical History:  Diagnosis Date  . Fibroids   . History of hiatal hernia   . Leg pain   . Migraine   . Sciatica     Patient Active Problem List   Diagnosis Date Noted  . Chronic pain syndrome 03/31/2016  . Screening for diabetes mellitus 12/31/2015  . S/P TAH (total abdominal hysterectomy) 09/29/2015  . Fibroid, uterine   . Menorrhagia with regular cycle   . Fibroid uterus 07/01/2015  . Left hip pain 11/06/2014    Past Surgical History:  Procedure Laterality Date  . ABDOMINAL HYSTERECTOMY  09/29/2015   Procedure: HYSTERECTOMY ABDOMINAL;  Surgeon: Mora Bellman, MD;  Location: [facility];  Service: Gynecology;;  . BILATERAL SALPINGECTOMY Bilateral 09/29/2015   Procedure: BILATERAL SALPINGECTOMY;  Surgeon: Mora Bellman, MD;  Location: [facility];  Service: Gynecology;  Laterality: Bilateral;  . TUBAL LIGATION    .  WISDOM TOOTH EXTRACTION      OB History    Gravida Para Term Preterm AB Living   2 2 2  0 0 2   SAB TAB Ectopic Multiple Live Births   0 0 0 0         Home Medications    Prior to Admission medications   Medication Sig Start Date End Date Taking? Authorizing Provider  Aspirin-Salicylamide-Caffeine (BC HEADACHE POWDER PO) Take 1 Package by mouth daily as needed (pain).    Historical Provider, MD  cyclobenzaprine (FLEXERIL) 10 MG tablet Take 1 tablet (10 mg total) by mouth 3 (three) times daily as needed for muscle spasms. Patient not taking: Reported on 06/23/2016 123XX123   Delora Fuel, MD  diclofenac sodium (VOLTAREN) 1 % GEL Apply 4 g topically 4 (four) times daily. Patient not taking: Reported on 06/23/2016 12/31/15   Tresa Garter, MD  ibuprofen (ADVIL,MOTRIN) 600 MG tablet Take 1 tablet (600 mg total) by mouth every 6 (six) hours as needed. 09/12/16   Jaime Pilcher Ward, PA-C  ibuprofen (ADVIL,MOTRIN) 600 MG tablet Take 1 tablet (600 mg total) by mouth 3 (three) times daily. 11/18/16   Shaneice Barsanti C Asani Mcburney, PA-C  lidocaine (LIDODERM) 5 % Place 1 patch onto the skin daily. Remove & Discard patch within 12 hours or as directed by MD 07/08/16   Helane Gunther Emiya Loomer, PA-C  lidocaine (LIDODERM) 5 % Place 1 patch onto the skin daily. Remove &  Discard patch within 12 hours or as directed by MD 11/18/16   Lorayne Bender, PA-C  methocarbamol (ROBAXIN) 500 MG tablet Take 1 tablet (500 mg total) by mouth 2 (two) times daily. 07/08/16   Tyjay Galindo C Payslie Mccaig, PA-C  methocarbamol (ROBAXIN) 500 MG tablet Take 1 tablet (500 mg total) by mouth 2 (two) times daily. 11/18/16   Caydance Kuehnle C Sedrick Tober, PA-C  naproxen (NAPROSYN) 500 MG tablet Take 1 tablet (500 mg total) by mouth 2 (two) times daily. Patient not taking: Reported on 06/23/2016 03/02/16   Charlesetta Shanks, MD  orphenadrine (NORFLEX) 100 MG tablet Take 1 tablet (100 mg total) by mouth 2 (two) times daily. Patient not taking: Reported on 06/23/2016 03/02/16   Charlesetta Shanks, MD  predniSONE  (STERAPRED UNI-PAK 21 TAB) 10 MG (21) TBPK tablet Take 1 tablet (10 mg total) by mouth daily. Take 6 tabs day 1, 5 tabs day 2, 4 tabs day 3, 3 tabs day 4, 2 tabs day 5, and 1 tab on day 6. 11/18/16   De Jaworski C Aerielle Stoklosa, PA-C  traMADol (ULTRAM) 50 MG tablet Take 1 tablet (50 mg total) by mouth every 6 (six) hours as needed. 06/30/16   Ozella Almond Ward, PA-C  traMADol (ULTRAM) 50 MG tablet Take 1 tablet (50 mg total) by mouth every 6 (six) hours as needed. 07/12/16   Delos Haring, PA-C    Family History Family History  Problem Relation Age of Onset  . Diabetes Sister   . Heart disease Paternal Grandmother   . Hypertension Father   . Dementia Mother   . Hypertension Mother     Social History Social History  Substance Use Topics  . Smoking status: Former Research scientist (life sciences)  . Smokeless tobacco: Never Used  . Alcohol use No     Allergies   Patient has no known allergies.   Review of Systems Review of Systems  Musculoskeletal: Positive for myalgias.  Neurological: Negative for weakness and numbness.     Physical Exam Updated Vital Signs BP 98/64 (BP Location: Left Arm)   Pulse 94   Temp 98 F (36.7 C) (Oral)   Resp 16   Ht 5\' 3"  (1.6 m)   Wt 100 lb (45.4 kg)   LMP 08/15/2015 (Approximate)   SpO2 100%   BMI 17.71 kg/m   Physical Exam  Constitutional: She appears well-developed and well-nourished. No distress.  HENT:  Head: Normocephalic and atraumatic.  Eyes: Conjunctivae are normal.  Neck: Neck supple.  Cardiovascular: Normal rate and regular rhythm.   Pulmonary/Chest: Effort normal.  Musculoskeletal:  Tenderness to left lumbar musculature and into the left buttock. Normal motor function intact in both lower extremities and spine. No midline spinal tenderness.   Neurological: She is alert.  No sensory deficits. Strength 5/5 in both lower extremities. No gait disturbance. Coordination intact.   Skin: Skin is warm and dry. She is not diaphoretic.  Psychiatric: She has a normal  mood and affect. Her behavior is normal.  Nursing note and vitals reviewed.    ED Treatments / Results  DIAGNOSTIC STUDIES:  Oxygen Saturation is 100% on RA, normal by my interpretation.    COORDINATION OF CARE:  1:15 PM Discussed treatment plan with pt at bedside and pt agreed to plan.   Labs (all labs ordered are listed, but only abnormal results are displayed) Labs Reviewed - No data to display  EKG  EKG Interpretation None       Radiology No results found.  Procedures Procedures (including critical care  time)  Medications Ordered in ED Medications - No data to display   Initial Impression / Assessment and Plan / ED Course  I have reviewed the triage vital signs and the nursing notes.  Pertinent labs & imaging results that were available during my care of the patient were reviewed by me and considered in my medical decision making (see chart for details).  Clinical Course     Patient presents with symptoms of sciatica. No red flag symptoms. No neuro or functional deficits. Home therapy and return precautions discussed.    Final Clinical Impressions(s) / ED Diagnoses   Final diagnoses:  Sciatica of left side    New Prescriptions Discharge Medication List as of 11/18/2016  1:26 PM    START taking these medications   Details  !! ibuprofen (ADVIL,MOTRIN) 600 MG tablet Take 1 tablet (600 mg total) by mouth 3 (three) times daily., Starting Fri 11/18/2016, Print    !! lidocaine (LIDODERM) 5 % Place 1 patch onto the skin daily. Remove & Discard patch within 12 hours or as directed by MD, Starting Fri 11/18/2016, Print    !! methocarbamol (ROBAXIN) 500 MG tablet Take 1 tablet (500 mg total) by mouth 2 (two) times daily., Starting Fri 11/18/2016, Print    predniSONE (STERAPRED UNI-PAK 21 TAB) 10 MG (21) TBPK tablet Take 1 tablet (10 mg total) by mouth daily. Take 6 tabs day 1, 5 tabs day 2, 4 tabs day 3, 3 tabs day 4, 2 tabs day 5, and 1 tab on day 6.,  Starting Fri 11/18/2016, Print     !! - Potential duplicate medications found. Please discuss with provider.     I personally performed the services described in this documentation, which was scribed in my presence. The recorded information has been reviewed and is accurate.     Lorayne Bender, PA-C 11/18/16 [place], MD 11/19/16 [phone]

## 2016-11-18 NOTE — ED Notes (Signed)
Left leg pain since yesterday, works at [employer] on her feet achy pain,  A friend gave her a muscle relaxer and it helped

## 2016-11-29 ENCOUNTER — Emergency Department (HOSPITAL_COMMUNITY)
Admission: EM | Admit: 2016-11-29 | Discharge: 2016-11-29 | Disposition: A | Discharge status: Home / Self Care | Payer: Medicaid Other | Attending: Emergency Medicine | Admitting: Emergency Medicine

## 2016-11-29 ENCOUNTER — Encounter (HOSPITAL_COMMUNITY): Payer: Self-pay | Admitting: Emergency Medicine

## 2016-11-29 DIAGNOSIS — Z7982 Long term (current) use of aspirin: Secondary | ICD-10-CM | POA: Diagnosis not present

## 2016-11-29 DIAGNOSIS — K0889 Other specified disorders of teeth and supporting structures: Secondary | ICD-10-CM | POA: Diagnosis present

## 2016-11-29 DIAGNOSIS — Z87891 Personal history of nicotine dependence: Secondary | ICD-10-CM | POA: Insufficient documentation

## 2016-11-29 DIAGNOSIS — M273 Alveolitis of jaws: Secondary | ICD-10-CM | POA: Diagnosis not present

## 2016-11-29 MED ORDER — TRAMADOL HCL 50 MG PO TABS
50.0000 mg | ORAL_TABLET | Freq: Once | ORAL | Status: DC
  Filled 2016-11-29: qty 1

## 2016-11-29 MED ORDER — AMOXICILLIN 500 MG PO CAPS
500.0000 mg | ORAL_CAPSULE | Freq: Once | ORAL | Status: AC
  Administered 2016-11-29: 500 mg via ORAL
  Filled 2016-11-29: qty 1

## 2016-11-29 NOTE — ED Provider Notes (Signed)
[facility] DEPT Provider Note   CSN: [id number] Arrival date & time: 11/29/16  0449     History   Chief Complaint Chief Complaint  Patient presents with  . Dental Pain    HPI Brianna Velez is a 40 y.o. female.  HPI   Patient presents to the ER with complaints of fibroids, hiatal hernia, chronic leg pain/sciatica, migraines, chronic pain syndrome, painful menstrual cramps. She reports having right upper jaw pain after having her tooth extracted on Wednesday. She comes to the ER by EMS. She has been taking Ibuprofen which does help. SHe is not on abx. She reports flushing her tooth socket out twice a day. She was given Norco but says it did not sit well with her stomach. Her surgeon was Dr.Quadir Muns and she has not tired to contact him yet. The pain is not getting worse since Wednesday she is concerned because its not getting better. Able to eat and drink, no fevers, neck pain, weakness, headaches, difficulty swallowing.  Past Medical History:  Diagnosis Date  . Fibroids   . History of hiatal hernia   . Leg pain   . Migraine   . Sciatica     Patient Active Problem List   Diagnosis Date Noted  . Chronic pain syndrome 03/31/2016  . Screening for diabetes mellitus 12/31/2015  . S/P TAH (total abdominal hysterectomy) 09/29/2015  . Fibroid, uterine   . Menorrhagia with regular cycle   . Fibroid uterus 07/01/2015  . Left hip pain 11/06/2014    Past Surgical History:  Procedure Laterality Date  . ABDOMINAL HYSTERECTOMY  09/29/2015   Procedure: HYSTERECTOMY ABDOMINAL;  Surgeon: Mora Bellman, MD;  Location: [facility];  Service: Gynecology;;  . BILATERAL SALPINGECTOMY Bilateral 09/29/2015   Procedure: BILATERAL SALPINGECTOMY;  Surgeon: Mora Bellman, MD;  Location: [facility];  Service: Gynecology;  Laterality: Bilateral;  . TUBAL LIGATION    . WISDOM TOOTH EXTRACTION      OB History    Gravida Para Term Preterm AB Living   2 2 2  0 0 2   SAB TAB Ectopic Multiple Live Births     0 0 0 0         Home Medications    Prior to Admission medications   Medication Sig Start Date End Date Taking? Authorizing Provider  Aspirin-Salicylamide-Caffeine (BC HEADACHE POWDER PO) Take 1 Package by mouth daily as needed (pain).    Historical Provider, MD  cyclobenzaprine (FLEXERIL) 10 MG tablet Take 1 tablet (10 mg total) by mouth 3 (three) times daily as needed for muscle spasms. Patient not taking: Reported on 06/23/2016 123XX123   Delora Fuel, MD  diclofenac sodium (VOLTAREN) 1 % GEL Apply 4 g topically 4 (four) times daily. Patient not taking: Reported on 06/23/2016 12/31/15   Tresa Garter, MD  ibuprofen (ADVIL,MOTRIN) 600 MG tablet Take 1 tablet (600 mg total) by mouth every 6 (six) hours as needed. 09/12/16   Jaime Pilcher Ward, PA-C  ibuprofen (ADVIL,MOTRIN) 600 MG tablet Take 1 tablet (600 mg total) by mouth 3 (three) times daily. 11/18/16   Shawn C Joy, PA-C  lidocaine (LIDODERM) 5 % Place 1 patch onto the skin daily. Remove & Discard patch within 12 hours or as directed by MD 07/08/16   Helane Gunther Joy, PA-C  lidocaine (LIDODERM) 5 % Place 1 patch onto the skin daily. Remove & Discard patch within 12 hours or as directed by MD 11/18/16   Lorayne Bender, PA-C  methocarbamol (ROBAXIN) 500 MG tablet Take  1 tablet (500 mg total) by mouth 2 (two) times daily. 07/08/16   Shawn C Joy, PA-C  methocarbamol (ROBAXIN) 500 MG tablet Take 1 tablet (500 mg total) by mouth 2 (two) times daily. 11/18/16   Shawn C Joy, PA-C  naproxen (NAPROSYN) 500 MG tablet Take 1 tablet (500 mg total) by mouth 2 (two) times daily. Patient not taking: Reported on 06/23/2016 03/02/16   Charlesetta Shanks, MD  orphenadrine (NORFLEX) 100 MG tablet Take 1 tablet (100 mg total) by mouth 2 (two) times daily. Patient not taking: Reported on 06/23/2016 03/02/16   Charlesetta Shanks, MD  predniSONE (STERAPRED UNI-PAK 21 TAB) 10 MG (21) TBPK tablet Take 1 tablet (10 mg total) by mouth daily. Take 6 tabs day 1, 5 tabs day 2, 4 tabs day  3, 3 tabs day 4, 2 tabs day 5, and 1 tab on day 6. 11/18/16   Shawn C Joy, PA-C  traMADol (ULTRAM) 50 MG tablet Take 1 tablet (50 mg total) by mouth every 6 (six) hours as needed. 06/30/16   Ozella Almond Ward, PA-C  traMADol (ULTRAM) 50 MG tablet Take 1 tablet (50 mg total) by mouth every 6 (six) hours as needed. 07/12/16   Delos Haring, PA-C    Family History Family History  Problem Relation Age of Onset  . Diabetes Sister   . Heart disease Paternal Grandmother   . Hypertension Father   . Dementia Mother   . Hypertension Mother     Social History Social History  Substance Use Topics  . Smoking status: Former Research scientist (life sciences)  . Smokeless tobacco: Never Used  . Alcohol use No     Allergies   Patient has no known allergies.   Review of Systems Review of Systems Review of Systems All other systems negative except as documented in the HPI. All pertinent positives and negatives as reviewed in the HPI.   Physical Exam Updated Vital Signs BP 114/76 (BP Location: Left Arm)   Pulse 77   Temp 97.8 F (36.6 C) (Oral)   Resp 19   LMP 08/15/2015 (Approximate)   SpO2 100%   Physical Exam  Constitutional: She appears well-developed and well-nourished. No distress.  HENT:  Head: Normocephalic and atraumatic.  Mouth/Throat: No oral lesions. No trismus in the jaw. No lacerations or dental caries.  surgically absent tooth with small clot present. No abscess noted. Gum looks mildly irritated and is tender to touch.  Eyes: Pupils are equal, round, and reactive to light.  Neck: Normal range of motion. Neck supple.  Cardiovascular: Normal rate and regular rhythm.   Pulmonary/Chest: Effort normal.  Abdominal: Soft.  Neurological: She is alert.  Skin: Skin is warm and dry.  Nursing note and vitals reviewed.    ED Treatments / Results  Labs (all labs ordered are listed, but only abnormal results are displayed) Labs Reviewed - No data to display  EKG  EKG Interpretation None        Radiology No results found.  Procedures Procedures (including critical care time)  Medications Ordered in ED Medications  traMADol (ULTRAM) tablet 50 mg (50 mg Oral Refused 11/29/16 0540)  amoxicillin (AMOXIL) capsule 500 mg (500 mg Oral Given 11/29/16 0540)     Initial Impression / Assessment and Plan / ED Course  I have reviewed the triage vital signs and the nursing notes.  Pertinent labs & imaging results that were available during my care of the patient were reviewed by me and considered in my medical decision making (see chart  for details).  Clinical Course    Will try to pack with dental paste on gauze in the situation that she has dry socket.- pt experienced significant relief. Originally had planned to give abx and Tramadol but now will have her use dental paste. Pt is to call surgeon when office opens today to discuss concerns and ED visit.  Medications  traMADol (ULTRAM) tablet 50 mg (50 mg Oral Refused 11/29/16 0540)  amoxicillin (AMOXIL) capsule 500 mg (500 mg Oral Given 11/29/16 0540)    I discussed results, diagnoses and plan with Michel Santee. They voice there understanding and questions were answered. We discussed follow-up recommendations and return precautions.   Final Clinical Impressions(s) / ED Diagnoses   Final diagnoses:  Dry socket    New Prescriptions New Prescriptions   No medications on file     Delos Haring, Hershal Coria 11/29/16 [place], MD 11/29/16 [phone]

## 2016-11-29 NOTE — ED Triage Notes (Signed)
Pt report right upper jaw pain after toothe extraction

## 2016-12-26 ENCOUNTER — Emergency Department (HOSPITAL_COMMUNITY)
Admission: EM | Admit: 2016-12-26 | Discharge: 2016-12-27 | Disposition: A | Discharge status: Home / Self Care | Payer: Medicaid Other | Attending: Emergency Medicine | Admitting: Emergency Medicine

## 2016-12-26 ENCOUNTER — Encounter (HOSPITAL_COMMUNITY): Payer: Self-pay

## 2016-12-26 DIAGNOSIS — Y9389 Activity, other specified: Secondary | ICD-10-CM | POA: Insufficient documentation

## 2016-12-26 DIAGNOSIS — R51 Headache: Secondary | ICD-10-CM | POA: Diagnosis present

## 2016-12-26 DIAGNOSIS — Y9289 Other specified places as the place of occurrence of the external cause: Secondary | ICD-10-CM | POA: Diagnosis not present

## 2016-12-26 DIAGNOSIS — Z87891 Personal history of nicotine dependence: Secondary | ICD-10-CM | POA: Insufficient documentation

## 2016-12-26 DIAGNOSIS — Y990 Civilian activity done for income or pay: Secondary | ICD-10-CM | POA: Insufficient documentation

## 2016-12-26 DIAGNOSIS — M5432 Sciatica, left side: Secondary | ICD-10-CM

## 2016-12-26 DIAGNOSIS — G43809 Other migraine, not intractable, without status migrainosus: Secondary | ICD-10-CM | POA: Insufficient documentation

## 2016-12-26 DIAGNOSIS — M5442 Lumbago with sciatica, left side: Secondary | ICD-10-CM | POA: Diagnosis not present

## 2016-12-26 DIAGNOSIS — X58XXXA Exposure to other specified factors, initial encounter: Secondary | ICD-10-CM | POA: Diagnosis not present

## 2016-12-26 NOTE — ED Triage Notes (Signed)
Pt reports she feels pressure in her lower back. She reports standing at work all day and pain into her sciatic nerve. Pt also reports headache. She is alert and oriented X4 and ambulatory.

## 2016-12-27 MED ORDER — KETOROLAC TROMETHAMINE 15 MG/ML IJ SOLN
15.0000 mg | Freq: Once | INTRAMUSCULAR | Status: AC
Start: 2016-12-27 — End: 2016-12-27
  Administered 2016-12-27: 15 mg via INTRAMUSCULAR
  Filled 2016-12-27: qty 1

## 2016-12-27 MED ORDER — DEXAMETHASONE SODIUM PHOSPHATE 10 MG/ML IJ SOLN
10.0000 mg | Freq: Once | INTRAMUSCULAR | Status: AC
  Administered 2016-12-27: 10 mg via INTRAMUSCULAR
  Filled 2016-12-27: qty 1

## 2016-12-27 MED ORDER — NAPROXEN 500 MG PO TABS: 500.0000 mg | ORAL_TABLET | Freq: Two times a day (BID) | ORAL | 0 refills | Status: DC

## 2016-12-27 NOTE — ED Provider Notes (Signed)
[facility] DEPT Provider Note   CSN: [id number] Arrival date & time: 12/26/16  1744  By signing my name below, I, Brianna Velez, attest that this documentation has been prepared under the direction and in the presence of No att. providers found. Electronically Signed: Oleh Velez, Scribe. 12/27/16. 12:28 AM.   History   Chief Complaint Chief Complaint  Patient presents with  . Back Pain  . Headache    HPI Brianna Velez is a 41 y.o. female with history of sciatica presents to the ED with L leg pain and she attributes to her typical sciatica flare. The patient states that "she was on her feet all day at work" and has developed "pressure" in her L lower back and radiates down her L leg. She is also experiencing associated paresthesias in her L leg. She has not taken anything at home for the pain. Pain is 10/10 in severity. She denies any bowel or bladder incontinence. She denies any focal weaknesses. She also presents today with a frontal headache that onset this afternoon. She took ibuprofen prior to arrival which partially relieved her pain. She denies any fever or neck stiffness. She has no other complaints at this time.   HPI  Past Medical History:  Diagnosis Date  . Fibroids   . History of hiatal hernia   . Leg pain   . Migraine   . Sciatica     Patient Active Problem List   Diagnosis Date Noted  . Chronic pain syndrome 03/31/2016  . Screening for diabetes mellitus 12/31/2015  . S/P TAH (total abdominal hysterectomy) 09/29/2015  . Fibroid, uterine   . Menorrhagia with regular cycle   . Fibroid uterus 07/01/2015  . Left hip pain 11/06/2014    Past Surgical History:  Procedure Laterality Date  . ABDOMINAL HYSTERECTOMY  09/29/2015   Procedure: HYSTERECTOMY ABDOMINAL;  Surgeon: Mora Bellman, MD;  Location: [facility];  Service: Gynecology;;  . BILATERAL SALPINGECTOMY Bilateral 09/29/2015   Procedure: BILATERAL SALPINGECTOMY;  Surgeon: Mora Bellman, MD;   Location: [facility];  Service: Gynecology;  Laterality: Bilateral;  . TUBAL LIGATION    . WISDOM TOOTH EXTRACTION      OB History    Gravida Para Term Preterm AB Living   2 2 2  0 0 2   SAB TAB Ectopic Multiple Live Births   0 0 0 0         Home Medications    Prior to Admission medications   Medication Sig Start Date End Date Taking? Authorizing Provider  Aspirin-Salicylamide-Caffeine (BC HEADACHE POWDER PO) Take 1 Package by mouth daily as needed (pain).    Historical Provider, MD  cyclobenzaprine (FLEXERIL) 10 MG tablet Take 1 tablet (10 mg total) by mouth 3 (three) times daily as needed for muscle spasms. Patient not taking: Reported on 06/23/2016 123XX123   Delora Fuel, MD  diclofenac sodium (VOLTAREN) 1 % GEL Apply 4 g topically 4 (four) times daily. Patient not taking: Reported on 06/23/2016 12/31/15   Tresa Garter, MD  ibuprofen (ADVIL,MOTRIN) 600 MG tablet Take 1 tablet (600 mg total) by mouth every 6 (six) hours as needed. 09/12/16   Jaime Pilcher Ward, PA-C  ibuprofen (ADVIL,MOTRIN) 600 MG tablet Take 1 tablet (600 mg total) by mouth 3 (three) times daily. 11/18/16   Shawn C Joy, PA-C  lidocaine (LIDODERM) 5 % Place 1 patch onto the skin daily. Remove & Discard patch within 12 hours or as directed by MD 07/08/16   Lorayne Bender, PA-C  lidocaine (LIDODERM) 5 % Place 1 patch onto the skin daily. Remove & Discard patch within 12 hours or as directed by MD 11/18/16   Lorayne Bender, PA-C  methocarbamol (ROBAXIN) 500 MG tablet Take 1 tablet (500 mg total) by mouth 2 (two) times daily. 07/08/16   Shawn C Joy, PA-C  methocarbamol (ROBAXIN) 500 MG tablet Take 1 tablet (500 mg total) by mouth 2 (two) times daily. 11/18/16   Shawn C Joy, PA-C  naproxen (NAPROSYN) 500 MG tablet Take 1 tablet (500 mg total) by mouth 2 (two) times daily. 12/27/16   Merryl Hacker, MD  orphenadrine (NORFLEX) 100 MG tablet Take 1 tablet (100 mg total) by mouth 2 (two) times daily. Patient not taking: Reported on  06/23/2016 03/02/16   Charlesetta Shanks, MD  predniSONE (STERAPRED UNI-PAK 21 TAB) 10 MG (21) TBPK tablet Take 1 tablet (10 mg total) by mouth daily. Take 6 tabs day 1, 5 tabs day 2, 4 tabs day 3, 3 tabs day 4, 2 tabs day 5, and 1 tab on day 6. 11/18/16   Shawn C Joy, PA-C  traMADol (ULTRAM) 50 MG tablet Take 1 tablet (50 mg total) by mouth every 6 (six) hours as needed. 06/30/16   Ozella Almond Ward, PA-C  traMADol (ULTRAM) 50 MG tablet Take 1 tablet (50 mg total) by mouth every 6 (six) hours as needed. 07/12/16   Delos Haring, PA-C    Family History Family History  Problem Relation Age of Onset  . Diabetes Sister   . Heart disease Paternal Grandmother   . Hypertension Father   . Dementia Mother   . Hypertension Mother     Social History Social History  Substance Use Topics  . Smoking status: Former Research scientist (life sciences)  . Smokeless tobacco: Never Used  . Alcohol use No     Allergies   Patient has no known allergies.   Review of Systems Review of Systems  Constitutional: Negative for fever.  Musculoskeletal: Positive for back pain. Negative for neck pain and neck stiffness.       L leg and L lower back pain  Neurological: Positive for headaches. Negative for weakness and numbness.       Paresthesias     Physical Exam Updated Vital Signs BP 111/68 (BP Location: Left Arm)   Pulse 75   Temp 98.1 F (36.7 C) (Oral)   Resp 16   LMP 08/15/2015 (Approximate)   SpO2 99%   Physical Exam  Constitutional: She is oriented to person, place, and time. She appears well-developed and well-nourished. No distress.  HENT:  Head: Normocephalic and atraumatic.  Cardiovascular: Normal rate, regular rhythm and normal heart sounds.   Pulmonary/Chest: Effort normal and breath sounds normal. No respiratory distress. She has no wheezes.  Musculoskeletal: She exhibits no edema.  Neurological: She is alert and oriented to person, place, and time.  Cranial nerves II through XII intact, 5 out of 5 strength in  all 4 extremities, positive left straight leg raise  Skin: Skin is warm and dry.  Psychiatric: She has a normal mood and affect.  Nursing note and vitals reviewed.    ED Treatments / Results  DIAGNOSTIC STUDIES: Oxygen Saturation is 98 percent on room air which is normal by my interpretation.    COORDINATION OF CARE: 12:31 AM Discussed treatment plan with pt at bedside and pt agreed to plan.  Labs (all labs ordered are listed, but only abnormal results are displayed) Labs Reviewed - No data to display  EKG  EKG Interpretation None       Radiology No results found.  Procedures Procedures (including critical care time)  Medications Ordered in ED Medications  ketorolac (TORADOL) 15 MG/ML injection 15 mg (15 mg Intramuscular Given 12/27/16 0043)  dexamethasone (DECADRON) injection 10 mg (10 mg Intramuscular Given 12/27/16 0043)     Initial Impression / Assessment and Plan / ED Course  I have reviewed the triage vital signs and the nursing notes.  Pertinent labs & imaging results that were available during my care of the patient were reviewed by me and considered in my medical decision making (see chart for details).     Patient presents with migraine and sciatica. History of the same. No red flags. Neurologically intact. She does have a positive straight leg raise. Patient given Toradol and Decadron IM. Clinically improved. Ambulatory. Will discharge with naproxen. Given strict return precautions.  After history, exam, and medical workup I feel the patient has been appropriately medically screened and is safe for discharge home. Pertinent diagnoses were discussed with the patient. Patient was given return precautions.   Final Clinical Impressions(s) / ED Diagnoses   Final diagnoses:  Other migraine without status migrainosus, not intractable  Sciatica of left side    New Prescriptions Discharge Medication List as of 12/27/2016  2:03 AM     I personally performed  the services described in this documentation, which was scribed in my presence. The recorded information has been reviewed and is accurate.     Merryl Hacker, MD 12/27/16 [id number]

## 2017-01-11 ENCOUNTER — Emergency Department (HOSPITAL_COMMUNITY)
Admission: EM | Admit: 2017-01-11 | Discharge: 2017-01-12 | Disposition: A | Discharge status: Home / Self Care | Payer: Medicaid Other | Attending: Emergency Medicine | Admitting: Emergency Medicine

## 2017-01-11 ENCOUNTER — Encounter (HOSPITAL_COMMUNITY): Payer: Self-pay | Admitting: Emergency Medicine

## 2017-01-11 DIAGNOSIS — M5442 Lumbago with sciatica, left side: Secondary | ICD-10-CM | POA: Diagnosis not present

## 2017-01-11 DIAGNOSIS — Z7982 Long term (current) use of aspirin: Secondary | ICD-10-CM | POA: Insufficient documentation

## 2017-01-11 DIAGNOSIS — Z79899 Other long term (current) drug therapy: Secondary | ICD-10-CM | POA: Insufficient documentation

## 2017-01-11 DIAGNOSIS — Z87891 Personal history of nicotine dependence: Secondary | ICD-10-CM | POA: Insufficient documentation

## 2017-01-11 DIAGNOSIS — M5432 Sciatica, left side: Secondary | ICD-10-CM

## 2017-01-11 DIAGNOSIS — M545 Low back pain: Secondary | ICD-10-CM | POA: Diagnosis present

## 2017-01-11 MED ORDER — NAPROXEN 500 MG PO TABS: 500.0000 mg | ORAL_TABLET | Freq: Two times a day (BID) | ORAL | 0 refills | Status: DC

## 2017-01-11 MED ORDER — METHOCARBAMOL 500 MG PO TABS: 500.0000 mg | ORAL_TABLET | Freq: Two times a day (BID) | ORAL | 0 refills | Status: DC

## 2017-01-11 MED ORDER — KETOROLAC TROMETHAMINE 30 MG/ML IJ SOLN
15.0000 mg | Freq: Once | INTRAMUSCULAR | Status: AC
  Administered 2017-01-11: 15 mg via INTRAMUSCULAR
  Filled 2017-01-11: qty 1

## 2017-01-11 MED ORDER — METHOCARBAMOL 500 MG PO TABS
500.0000 mg | ORAL_TABLET | Freq: Once | ORAL | Status: AC
  Administered 2017-01-11: 500 mg via ORAL
  Filled 2017-01-11: qty 1

## 2017-01-11 NOTE — ED Triage Notes (Signed)
Patient to ED c/o sciatica flare up - states recurrent flare ups, usually takes naproxen. Took ibuprofen today without relief. Pain in L side shoots down L leg. Ambulatory with steady gait.

## 2017-01-11 NOTE — Discharge Instructions (Signed)
Take your medications as prescribed as needed for pain relief. I recommend refrain from doing any heavy lifting or repetitive movement that exacerbates her symptoms for the next few days.  Follow-up with your primary care provider within the next week for follow-up evaluation and further management of your chronic low back pain/sciatica. Please return to the Emergency Department if symptoms worsen or new onset of fever, numbness, tingling, groin anesthesia, loss of bowel or bladder, weakness, chest pain, abdominal pain, vomiting.

## 2017-01-11 NOTE — ED Provider Notes (Signed)
[facility] DEPT Provider Note   CSN: [id number] Arrival date & time: 01/11/17  1946  By signing my name below, I, Dora Sims, attest that this documentation has been prepared under the direction and in the presence of Harlene Ramus, [place]. Electronically Signed: Dora Sims, Scribe. 01/11/2017. 10:14 PM.  History   Chief Complaint Chief Complaint  Patient presents with  . Back Pain    The history is provided by the patient. No language interpreter was used.     HPI Comments: Brianna Velez is a 41 y.o. female with PMHx significant for sciatica who presents to the Emergency Department complaining of constant left-sided back pain consistent with past sciatica flare ups beginning this afternoon. She reports associated pain radiation down her posterior LLE as well as tingling in her LLE. She states her pain is worse with general movement and bending over. 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 No medications or treatments tried PTA. No recent falls or trauma to her back. No h/o immunocompromised conditions or IV drug use. No recent visits to an acupuncturist or chiropractor. She denies fever, chest pain, abdominal pain, nausea, vomiting, diarrhea, constipation, urinary/bowel incontinence, saddle anesthesia, neck pain, focal weakness, or any other associated symptoms. Pt notes she does not want prednisone/steroids because it prevents her from sleeping.  Past Medical History:  Diagnosis Date  . Fibroids   . History of hiatal hernia   . Leg pain   . Migraine   . Sciatica     Patient Active Problem List   Diagnosis Date Noted  . Chronic pain syndrome 03/31/2016  . Screening for diabetes mellitus 12/31/2015  . S/P TAH (total abdominal hysterectomy) 09/29/2015  . Fibroid, uterine   . Menorrhagia with regular cycle   . Fibroid uterus 07/01/2015  . Left hip pain 11/06/2014    Past Surgical History:    Procedure Laterality Date  . ABDOMINAL HYSTERECTOMY  09/29/2015   Procedure: HYSTERECTOMY ABDOMINAL;  Surgeon: Mora Bellman, MD;  Location: [facility];  Service: Gynecology;;  . BILATERAL SALPINGECTOMY Bilateral 09/29/2015   Procedure: BILATERAL SALPINGECTOMY;  Surgeon: Mora Bellman, MD;  Location: [facility];  Service: Gynecology;  Laterality: Bilateral;  . TUBAL LIGATION    . WISDOM TOOTH EXTRACTION      OB History    Gravida Para Term Preterm AB Living   2 2 2  0 0 2   SAB TAB Ectopic Multiple Live Births   0 0 0 0         Home Medications    Prior to Admission medications   Medication Sig Start Date End Date Taking? Authorizing Provider  Aspirin-Salicylamide-Caffeine (BC HEADACHE POWDER PO) Take 1 Package by mouth daily as needed (pain).    Historical Provider, MD  cyclobenzaprine (FLEXERIL) 10 MG tablet Take 1 tablet (10 mg total) by mouth 3 (three) times daily as needed for muscle spasms. Patient not taking: Reported on 06/23/2016 123XX123   Delora Fuel, MD  diclofenac sodium (VOLTAREN) 1 % GEL Apply 4 g topically 4 (four) times daily. Patient not taking: Reported on 06/23/2016 12/31/15   Tresa Garter, MD  ibuprofen (ADVIL,MOTRIN) 600 MG tablet Take 1 tablet (600 mg total) by mouth every 6 (six) hours as needed. 09/12/16   Jaime Pilcher Ward, PA-C  ibuprofen (ADVIL,MOTRIN) 600 MG tablet Take 1 tablet (600 mg total) by mouth 3 (three) times daily. 11/18/16   Shawn C Joy, PA-C  lidocaine (LIDODERM)  5 % Place 1 patch onto the skin daily. Remove & Discard patch within 12 hours or as directed by MD 07/08/16   Helane Gunther Joy, PA-C  lidocaine (LIDODERM) 5 % Place 1 patch onto the skin daily. Remove & Discard patch within 12 hours or as directed by MD 11/18/16   Lorayne Bender, PA-C  methocarbamol (ROBAXIN) 500 MG tablet Take 1 tablet (500 mg total) by mouth 2 (two) times daily. 01/11/17   Nona Dell, PA-C  naproxen (NAPROSYN) 500 MG tablet Take 1 tablet (500 mg total) by mouth 2  (two) times daily. 01/11/17   Nona Dell, PA-C  orphenadrine (NORFLEX) 100 MG tablet Take 1 tablet (100 mg total) by mouth 2 (two) times daily. Patient not taking: Reported on 06/23/2016 03/02/16   Charlesetta Shanks, MD  predniSONE (STERAPRED UNI-PAK 21 TAB) 10 MG (21) TBPK tablet Take 1 tablet (10 mg total) by mouth daily. Take 6 tabs day 1, 5 tabs day 2, 4 tabs day 3, 3 tabs day 4, 2 tabs day 5, and 1 tab on day 6. 11/18/16   Shawn C Joy, PA-C  traMADol (ULTRAM) 50 MG tablet Take 1 tablet (50 mg total) by mouth every 6 (six) hours as needed. 06/30/16   Ozella Almond Ward, PA-C  traMADol (ULTRAM) 50 MG tablet Take 1 tablet (50 mg total) by mouth every 6 (six) hours as needed. 07/12/16   Delos Haring, PA-C    Family History Family History  Problem Relation Age of Onset  . Diabetes Sister   . Heart disease Paternal Grandmother   . Hypertension Father   . Dementia Mother   . Hypertension Mother     Social History Social History  Substance Use Topics  . Smoking status: Former Research scientist (life sciences)  . Smokeless tobacco: Never Used  . Alcohol use No     Allergies   Patient has no known allergies.   Review of Systems Review of Systems  Constitutional: Negative for fever.  Cardiovascular: Negative for chest pain.  Gastrointestinal: Negative for abdominal pain, constipation, diarrhea, nausea and vomiting.       Negative for bowel incontinence.  Genitourinary:       Negative for urinary incontinence.  Musculoskeletal: Positive for back pain and myalgias. Negative for neck pain.  Neurological: Positive for numbness. Negative for weakness.     Physical Exam Updated Vital Signs BP 105/72 (BP Location: Left Arm)   Pulse 74   Temp 98 F (36.7 C) (Oral)   Resp 16   Ht 5\' 4"  (1.626 m)   Wt 45.4 kg   LMP 08/15/2015 (Approximate)   SpO2 100%   BMI 17.16 kg/m   Physical Exam  Constitutional: She is oriented to person, place, and time. She appears well-developed and well-nourished.   HENT:  Head: Normocephalic and atraumatic.  Eyes: Conjunctivae and EOM are normal. Right eye exhibits no discharge. Left eye exhibits no discharge. No scleral icterus.  Neck: Normal range of motion. Neck supple.  Cardiovascular: Normal rate, regular rhythm, normal heart sounds and intact distal pulses.   Pulmonary/Chest: Effort normal and breath sounds normal. No respiratory distress. She has no wheezes. She has no rales. She exhibits no tenderness.  Abdominal: Soft. Bowel sounds are normal. She exhibits no distension and no mass. There is no tenderness. There is no rebound and no guarding. No hernia.  Musculoskeletal: She exhibits tenderness. She exhibits no edema.  No midline C, T, or L tenderness. Mild tenderness to palpation over left lumbar paraspinal  muscles. Positive left straight leg raise. Full range of motion of neck and back. Full range of motion of bilateral upper and lower extremities, with 5/5 strength. Sensation intact. 2+ radial and PT pulses. Cap refill <2 seconds. Patient able to stand and ambulate without assistance but endorses pain.   Neurological: She is alert and oriented to person, place, and time. She displays normal reflexes.  Skin: Skin is warm and dry.  Nursing note and vitals reviewed.    ED Treatments / Results  Labs (all labs ordered are listed, but only abnormal results are displayed) Labs Reviewed - No data to display  EKG  EKG Interpretation None       Radiology No results found.  Procedures Procedures (including critical care time)  DIAGNOSTIC STUDIES: Oxygen Saturation is 100% on RA, normal by my interpretation.    COORDINATION OF CARE: 10:23 PM Discussed treatment plan with pt at bedside and pt agreed to plan.  Medications Ordered in ED Medications  ketorolac (TORADOL) 30 MG/ML injection 15 mg (15 mg Intramuscular Given 01/11/17 2306)  methocarbamol (ROBAXIN) tablet 500 mg (500 mg Oral Given 01/11/17 2228)     Initial Impression /  Assessment and Plan / ED Course  I have reviewed the triage vital signs and the nursing notes.  Pertinent labs & imaging results that were available during my care of the patient were reviewed by me and considered in my medical decision making (see chart for details).      Final Clinical Impressions(s) / ED Diagnoses   Normal neurological exam, no evidence of urinary incontinence or retention, pain is consistently reproducible. There is no evidence of AAA or concern for dissection at this time.   Patient can walk but states is painful.  No loss of bowel or bladder control.  No concern for cauda equina.  No fever, night sweats, weight loss, h/o cancer, IVDU.  Pain treated here in the department with adequate improvement. RICE protocol and pain medicine indicated and discussed with patient. I have also discussed reasons to return immediately to the ER.  Patient expresses understanding and agrees with plan.     Final diagnoses:  Sciatica of left side    New Prescriptions New Prescriptions   METHOCARBAMOL (ROBAXIN) 500 MG TABLET    Take 1 tablet (500 mg total) by mouth 2 (two) times daily.   NAPROXEN (NAPROSYN) 500 MG TABLET    Take 1 tablet (500 mg total) by mouth 2 (two) times daily.   I personally performed the services described in this documentation, which was scribed in my presence. The recorded information has been reviewed and is accurate.    Chesley Noon [place], [place] 01/11/17 [employer], MD 01/12/17 0230

## 2017-01-11 NOTE — ED Notes (Addendum)
Pt called out in pain. Agreed to try Toradol.

## 2017-02-06 ENCOUNTER — Encounter (HOSPITAL_COMMUNITY): Payer: Self-pay | Admitting: *Deleted

## 2017-02-06 ENCOUNTER — Emergency Department (HOSPITAL_COMMUNITY)
Admission: EM | Admit: 2017-02-06 | Discharge: 2017-02-06 | Disposition: A | Discharge status: Home / Self Care | Payer: Medicaid Other | Attending: Emergency Medicine | Admitting: Emergency Medicine

## 2017-02-06 DIAGNOSIS — Z7982 Long term (current) use of aspirin: Secondary | ICD-10-CM | POA: Diagnosis not present

## 2017-02-06 DIAGNOSIS — M5442 Lumbago with sciatica, left side: Secondary | ICD-10-CM | POA: Insufficient documentation

## 2017-02-06 DIAGNOSIS — M5430 Sciatica, unspecified side: Secondary | ICD-10-CM

## 2017-02-06 DIAGNOSIS — M545 Low back pain: Secondary | ICD-10-CM | POA: Diagnosis present

## 2017-02-06 DIAGNOSIS — Z87891 Personal history of nicotine dependence: Secondary | ICD-10-CM | POA: Insufficient documentation

## 2017-02-06 MED ORDER — IBUPROFEN 800 MG PO TABS
800.0000 mg | ORAL_TABLET | Freq: Once | ORAL | Status: AC
  Administered 2017-02-06: 800 mg via ORAL
  Filled 2017-02-06: qty 1

## 2017-02-06 MED ORDER — CYCLOBENZAPRINE HCL 5 MG PO TABS: 5.0000 mg | ORAL_TABLET | Freq: Two times a day (BID) | ORAL | 0 refills | Status: DC | PRN

## 2017-02-06 MED ORDER — IBUPROFEN 800 MG PO TABS: 800.0000 mg | ORAL_TABLET | Freq: Three times a day (TID) | ORAL | 0 refills | Status: DC

## 2017-02-06 MED ORDER — CYCLOBENZAPRINE HCL 10 MG PO TABS
5.0000 mg | ORAL_TABLET | Freq: Once | ORAL | Status: AC
  Administered 2017-02-06: 5 mg via ORAL
  Filled 2017-02-06: qty 1

## 2017-02-06 NOTE — ED Triage Notes (Signed)
Pt c/o L lower back pain that radiates down L leg, hx of sciatica, ambulatory, denies injury, A&O x4

## 2017-02-06 NOTE — ED Provider Notes (Signed)
[facility] DEPT Provider Note   CSN: [id number] Arrival date & time: 02/06/17  1159   By signing my name below, I, Brianna Velez, attest that this documentation has been prepared under the direction and in the presence of Glendell Docker, NP. Electronically Signed: Hilbert Velez, Scribe. 02/06/17. 12:42 PM. History   Chief Complaint No chief complaint on file.   The history is provided by the patient. No language interpreter was used.  HPI Comments: Brianna Velez is a 41 y.o. female who presents to the Emergency Department complaining of lower back pain that began earlier today. She states that the pain radiates down the back of her leg. The patient states that she has a hx of sciatic and that this is a flair-up. She states that her last episode was about a week ago. She took an 800 mg ibuprofen and the pain eventually went away at that time. She took a 600 mg ibuprofen today for her current episode with no significant relief.  She denies any recent injuries, dysuria, weakness, and numbness. The patient states that she does a lot of bending for her occupation. She states that she has been seeing her health and wellness for her sciatica and they advised her to have an exercise regimen to manage her condition. She states that she has been compliant with her exercise.  Past Medical History:  Diagnosis Date  . Fibroids   . History of hiatal hernia   . Leg pain   . Migraine   . Sciatica     Patient Active Problem List   Diagnosis Date Noted  . Chronic pain syndrome 03/31/2016  . Screening for diabetes mellitus 12/31/2015  . S/P TAH (total abdominal hysterectomy) 09/29/2015  . Fibroid, uterine   . Menorrhagia with regular cycle   . Fibroid uterus 07/01/2015  . Left hip pain 11/06/2014    Past Surgical History:  Procedure Laterality Date  . ABDOMINAL HYSTERECTOMY  09/29/2015   Procedure: HYSTERECTOMY ABDOMINAL;  Surgeon: Mora Bellman, MD;  Location: [facility];  Service:  Gynecology;;  . BILATERAL SALPINGECTOMY Bilateral 09/29/2015   Procedure: BILATERAL SALPINGECTOMY;  Surgeon: Mora Bellman, MD;  Location: [facility];  Service: Gynecology;  Laterality: Bilateral;  . TUBAL LIGATION    . WISDOM TOOTH EXTRACTION      OB History    Gravida Para Term Preterm AB Living   2 2 2  0 0 2   SAB TAB Ectopic Multiple Live Births   0 0 0 0         Home Medications    Prior to Admission medications   Medication Sig Start Date End Date Taking? Authorizing Provider  Aspirin-Salicylamide-Caffeine (BC HEADACHE POWDER PO) Take 1 Package by mouth daily as needed (pain).    Historical Provider, MD  cyclobenzaprine (FLEXERIL) 5 MG tablet Take 1 tablet (5 mg total) by mouth 2 (two) times daily as needed for muscle spasms. 02/06/17   Glendell Docker, NP  diclofenac sodium (VOLTAREN) 1 % GEL Apply 4 g topically 4 (four) times daily. Patient not taking: Reported on 06/23/2016 12/31/15   Tresa Garter, MD  ibuprofen (ADVIL,MOTRIN) 800 MG tablet Take 1 tablet (800 mg total) by mouth 3 (three) times daily. 02/06/17   Glendell Docker, NP  lidocaine (LIDODERM) 5 % Place 1 patch onto the skin daily. Remove & Discard patch within 12 hours or as directed by MD 07/08/16   Helane Gunther Joy, PA-C  lidocaine (LIDODERM) 5 % Place 1 patch onto the skin daily. Remove &  Discard patch within 12 hours or as directed by MD 11/18/16   Lorayne Bender, PA-C  methocarbamol (ROBAXIN) 500 MG tablet Take 1 tablet (500 mg total) by mouth 2 (two) times daily. 01/11/17   Nona Dell, PA-C  naproxen (NAPROSYN) 500 MG tablet Take 1 tablet (500 mg total) by mouth 2 (two) times daily. 01/11/17   Nona Dell, PA-C  orphenadrine (NORFLEX) 100 MG tablet Take 1 tablet (100 mg total) by mouth 2 (two) times daily. Patient not taking: Reported on 06/23/2016 03/02/16   Charlesetta Shanks, MD  predniSONE (STERAPRED UNI-PAK 21 TAB) 10 MG (21) TBPK tablet Take 1 tablet (10 mg total) by mouth daily. Take 6 tabs day 1, 5  tabs day 2, 4 tabs day 3, 3 tabs day 4, 2 tabs day 5, and 1 tab on day 6. 11/18/16   Shawn C Joy, PA-C  traMADol (ULTRAM) 50 MG tablet Take 1 tablet (50 mg total) by mouth every 6 (six) hours as needed. 06/30/16   Ozella Almond Ward, PA-C  traMADol (ULTRAM) 50 MG tablet Take 1 tablet (50 mg total) by mouth every 6 (six) hours as needed. 07/12/16   Delos Haring, PA-C    Family History Family History  Problem Relation Age of Onset  . Diabetes Sister   . Heart disease Paternal Grandmother   . Hypertension Father   . Dementia Mother   . Hypertension Mother     Social History Social History  Substance Use Topics  . Smoking status: Former Smoker    Quit date: 12/05/1996  . Smokeless tobacco: Never Used  . Alcohol use No     Allergies   Patient has no known allergies.   Review of Systems Review of Systems  Genitourinary: Positive for enuresis. Negative for dysuria.  Musculoskeletal: Positive for back pain and myalgias.       Leg pain  Neurological: Negative for weakness and numbness.  All other systems reviewed and are negative.    Physical Exam Updated Vital Signs BP 109/70 (BP Location: Left Arm)   Pulse 68   Temp 97.9 F (36.6 C) (Oral)   Resp 14   Ht 4\' 11"  (1.499 m)   Wt 100 lb (45.4 kg)   LMP 08/15/2015 (Approximate)   SpO2 100%   BMI 20.20 kg/m   Physical Exam  Constitutional: She is oriented to person, place, and time. She appears well-developed and well-nourished.  HENT:  Head: Normocephalic.  Eyes: EOM are normal.  Neck: Normal range of motion.  Pulmonary/Chest: Effort normal.  Abdominal: She exhibits no distension.  Musculoskeletal: Normal range of motion.  Left sciatic notch tenderness. Able to do bilateral straight leg raises  Neurological: She is alert and oriented to person, place, and time.  Psychiatric: She has a normal mood and affect.  Nursing note and vitals reviewed.    ED Treatments / Results  DIAGNOSTIC STUDIES: Oxygen Saturation is  100% on RA, normal by my interpretation.    COORDINATION OF CARE: 12:31 PM Discussed treatment plan with pt at bedside and pt agreed to plan. I will put the patient on some muscle relaxers and I will give her some ibuprofen.  Labs (all labs ordered are listed, but only abnormal results are displayed) Labs Reviewed - No data to display  EKG  EKG Interpretation None       Radiology No results found.  Procedures Procedures (including critical care time)  Medications Ordered in ED Medications  ibuprofen (ADVIL,MOTRIN) tablet 800 mg (not administered)  cyclobenzaprine (FLEXERIL) tablet 5 mg (not administered)     Initial Impression / Assessment and Plan / ED Course  I have reviewed the triage vital signs and the nursing notes.  Pertinent labs & imaging results that were available during my care of the patient were reviewed by me and considered in my medical decision making (see chart for details).     No red flag symptoms. Will treat with flexeril and ibuprofen  Final Clinical Impressions(s) / ED Diagnoses   Final diagnoses:  Sciatica, unspecified laterality    New Prescriptions New Prescriptions   CYCLOBENZAPRINE (FLEXERIL) 5 MG TABLET    Take 1 tablet (5 mg total) by mouth 2 (two) times daily as needed for muscle spasms.   IBUPROFEN (ADVIL,MOTRIN) 800 MG TABLET    Take 1 tablet (800 mg total) by mouth 3 (three) times daily.   I personally performed the services described in this documentation, which was scribed in my presence. The recorded information has been reviewed and is accurate.    Glendell Docker, NP 02/06/17 [place], MD 02/13/17 1616

## 2017-02-16 ENCOUNTER — Emergency Department (HOSPITAL_COMMUNITY)
Admission: EM | Admit: 2017-02-16 | Discharge: 2017-02-16 | Disposition: A | Discharge status: Home / Self Care | Payer: Medicaid Other | Attending: Emergency Medicine | Admitting: Emergency Medicine

## 2017-02-16 ENCOUNTER — Encounter (HOSPITAL_COMMUNITY): Payer: Self-pay

## 2017-02-16 DIAGNOSIS — M5442 Lumbago with sciatica, left side: Secondary | ICD-10-CM | POA: Insufficient documentation

## 2017-02-16 DIAGNOSIS — Z79899 Other long term (current) drug therapy: Secondary | ICD-10-CM | POA: Diagnosis not present

## 2017-02-16 DIAGNOSIS — M5432 Sciatica, left side: Secondary | ICD-10-CM

## 2017-02-16 DIAGNOSIS — Z87891 Personal history of nicotine dependence: Secondary | ICD-10-CM | POA: Insufficient documentation

## 2017-02-16 MED ORDER — DICLOFENAC SODIUM 50 MG PO TBEC: 50.0000 mg | DELAYED_RELEASE_TABLET | Freq: Two times a day (BID) | ORAL | 0 refills | Status: DC

## 2017-02-16 MED ORDER — CYCLOBENZAPRINE HCL 5 MG PO TABS: 5.0000 mg | ORAL_TABLET | Freq: Three times a day (TID) | ORAL | 0 refills | Status: DC | PRN

## 2017-02-16 MED ORDER — LIDOCAINE 5 % EX PTCH: 1.0000 | MEDICATED_PATCH | CUTANEOUS | 0 refills | Status: DC

## 2017-02-16 MED ORDER — HYDROCODONE-ACETAMINOPHEN 5-325 MG PO TABS
1.0000 | ORAL_TABLET | Freq: Once | ORAL | Status: AC
  Administered 2017-02-16: 1 via ORAL
  Filled 2017-02-16: qty 1

## 2017-02-16 NOTE — ED Triage Notes (Signed)
Pt reports left leg pain, stated "my sciatica is flaring up again."

## 2017-02-16 NOTE — Discharge Instructions (Signed)
Do not drive while taking the muscle relaxant as it will make you sleepy. °

## 2017-02-16 NOTE — ED Provider Notes (Signed)
[facility] DEPT Provider Note    By signing my name below, I, Bea Graff, attest that this documentation has been prepared under the direction and in the presence of [facility], [place]. Electronically Signed: Bea Graff, ED Scribe. 02/16/17. 6:34 PM.    History   Chief Complaint Chief Complaint  Patient presents with  . Sciatica   The history is provided by the patient and medical records. No language interpreter was used.    Brianna Velez is a 41 y.o. female with PMHx of sciatica and chronic pain syndrome who presents to the Emergency Department complaining of left sided sciatica exacerbation that began this morning. She reports associated throbbing low back pain that radiates down her LLE. She has seen an orthopedist in the past and was told she had "a damaged nerve". She stopped seeing him and had been going to [place] but has not been seen there in a year. She has taken Ibuprofen for pain with no significant relief. Walking and movement increases the pain. She denies alleviating factors. She denies numbness, tingling or weakness of the LLE, fever, chills, nausea, vomiting, abdominal pain, diarrhea, bowel or bladder incontinence. She denies any trauma, injury or fall. She denies pregnancy stating she had a hysterectomy.   Past Medical History:  Diagnosis Date  . Fibroids   . History of hiatal hernia   . Leg pain   . Migraine   . Sciatica     Patient Active Problem List   Diagnosis Date Noted  . Chronic pain syndrome 03/31/2016  . Screening for diabetes mellitus 12/31/2015  . S/P TAH (total abdominal hysterectomy) 09/29/2015  . Fibroid, uterine   . Menorrhagia with regular cycle   . Fibroid uterus 07/01/2015  . Left hip pain 11/06/2014    Past Surgical History:  Procedure Laterality Date  . ABDOMINAL HYSTERECTOMY  09/29/2015   Procedure: HYSTERECTOMY ABDOMINAL;  Surgeon: Mora Bellman, MD;  Location: [facility];  Service: Gynecology;;  .  BILATERAL SALPINGECTOMY Bilateral 09/29/2015   Procedure: BILATERAL SALPINGECTOMY;  Surgeon: Mora Bellman, MD;  Location: [facility];  Service: Gynecology;  Laterality: Bilateral;  . TUBAL LIGATION    . WISDOM TOOTH EXTRACTION      OB History    Gravida Para Term Preterm AB Living   2 2 2  0 0 2   SAB TAB Ectopic Multiple Live Births   0 0 0 0         Home Medications    Prior to Admission medications   Medication Sig Start Date End Date Taking? Authorizing Provider  Aspirin-Salicylamide-Caffeine (BC HEADACHE POWDER PO) Take 1 Package by mouth daily as needed (pain).    Historical Provider, MD  cyclobenzaprine (FLEXERIL) 5 MG tablet Take 1 tablet (5 mg total) by mouth 3 (three) times daily as needed for muscle spasms. 02/16/17   Tyjae Issa Bunnie Pion, NP  diclofenac (VOLTAREN) 50 MG EC tablet Take 1 tablet (50 mg total) by mouth 2 (two) times daily. 02/16/17   Ysabel Cowgill Bunnie Pion, NP  lidocaine (LIDODERM) 5 % Place 1 patch onto the skin daily. Remove & Discard patch within 12 hours or as directed by MD 02/16/17   Ashley Murrain, NP    Family History Family History  Problem Relation Age of Onset  . Diabetes Sister   . Heart disease Paternal Grandmother   . Hypertension Father   . Dementia Mother   . Hypertension Mother     Social History Social History  Substance Use Topics  .  Smoking status: Former Smoker    Quit date: 12/05/1996  . Smokeless tobacco: Never Used  . Alcohol use No     Allergies   Patient has no known allergies.   Review of Systems Review of Systems  Constitutional: Negative for chills and fever.  Respiratory: Negative for shortness of breath.   Cardiovascular: Negative for chest pain.  Gastrointestinal: Negative for abdominal pain, diarrhea, nausea and vomiting.       No bowel or bladder incontinence  Musculoskeletal: Positive for arthralgias and back pain.  Skin: Negative for wound.  Neurological: Negative for weakness and numbness.     Physical Exam Updated  Vital Signs BP 106/79 (BP Location: Right Arm)   Pulse 88   Temp 98 F (36.7 C) (Oral)   Resp 16   LMP 08/15/2015 (Approximate)   SpO2 100%   Physical Exam  Constitutional: She is oriented to person, place, and time. She appears well-developed and well-nourished. No distress.  HENT:  Head: Normocephalic.  Eyes: EOM are normal.  Neck: Neck supple.  Cardiovascular: Normal rate and regular rhythm.   Radial pulses 2+ bilaterally. Adequate circulation.  Pulmonary/Chest: Effort normal and breath sounds normal.  Abdominal: Soft. There is no tenderness.  Musculoskeletal: Normal range of motion.       Lumbar back: She exhibits tenderness and spasm. She exhibits normal range of motion and normal pulse.  SLR without difficulty. Tenderness over left sciatic nerve. No tenderness over lumbar spine.  Neurological: She is alert and oriented to person, place, and time. She displays normal reflexes. No cranial nerve deficit.  Grip strength normal bilaterally. DTRs normal bilaterally. Ambulatory with steady gait. No foot drag.  Skin: Skin is warm and dry.  Psychiatric: She has a normal mood and affect. Her behavior is normal.  Nursing note and vitals reviewed.    ED Treatments / Results  DIAGNOSTIC STUDIES: Oxygen Saturation is 100% on RA, normal by my interpretation.   COORDINATION OF CARE: 6:32 PM- Will prescribe muscle relaxer. Pt requests prescription for Tramadol but informed her she would receive a prescription for Voltaren. Will provide work note. Pt verbalizes understanding and agrees to plan.  Medications  HYDROcodone-acetaminophen (NORCO/VICODIN) 5-325 MG per tablet 1 tablet (1 tablet Oral Given 02/16/17 1857)    Labs (all labs ordered are listed, but only abnormal results are displayed) Labs Reviewed - No data to display  Radiology No results found.  Procedures Procedures (including critical care time)  Medications Ordered in ED Medications  HYDROcodone-acetaminophen  (NORCO/VICODIN) 5-325 MG per tablet 1 tablet (1 tablet Oral Given 02/16/17 1857)     Initial Impression / Assessment and Plan / ED Course  I have reviewed the triage vital signs and the nursing notes.  Pertinent labs & imaging results that were available during my care of the patient were reviewed by me and considered in my medical decision making (see chart for details).   Patient is a 41 y.o. female with a hx of chronic pain syndrome and sciatica who presents to the ED with a sciatica exacerbation that began this morning. No neurological deficits appreciated. Patient is ambulatory. No warning symptoms of back pain including: fecal incontinence, urinary retention or overflow incontinence, night sweats, waking from sleep with back pain, unexplained fevers or weight loss, h/o cancer, IVDU, recent trauma. No concern for cauda equina, epidural abscess, or other serious cause of back pain. Conservative measures such as rest, ice/heat and pain medicine indicated with PCP follow-up if no improvement with conservative management.  I personally performed the services described in this documentation, which was scribed in my presence. The recorded information has been reviewed and is accurate.   Final Clinical Impressions(s) / ED Diagnoses   Final diagnoses:  Sciatica of left side    New Prescriptions Discharge Medication List as of 02/16/2017  6:39 PM    START taking these medications   Details  diclofenac (VOLTAREN) 50 MG EC tablet Take 1 tablet (50 mg total) by mouth 2 (two) times daily., Starting Thu 02/16/2017, [place], NP 02/17/17 1848    Gwenyth Allegra Tegeler, MD 02/20/17 [phone]

## 2017-03-01 ENCOUNTER — Emergency Department (HOSPITAL_COMMUNITY): Payer: Medicaid Other

## 2017-03-01 ENCOUNTER — Emergency Department (HOSPITAL_COMMUNITY)
Admission: EM | Admit: 2017-03-01 | Discharge: 2017-03-01 | Disposition: A | Discharge status: Home / Self Care | Payer: Medicaid Other | Attending: Emergency Medicine | Admitting: Emergency Medicine

## 2017-03-01 DIAGNOSIS — M545 Low back pain: Secondary | ICD-10-CM | POA: Diagnosis present

## 2017-03-01 DIAGNOSIS — Z79899 Other long term (current) drug therapy: Secondary | ICD-10-CM | POA: Insufficient documentation

## 2017-03-01 DIAGNOSIS — Z7982 Long term (current) use of aspirin: Secondary | ICD-10-CM | POA: Diagnosis not present

## 2017-03-01 DIAGNOSIS — M5442 Lumbago with sciatica, left side: Secondary | ICD-10-CM | POA: Diagnosis not present

## 2017-03-01 DIAGNOSIS — Z87891 Personal history of nicotine dependence: Secondary | ICD-10-CM | POA: Insufficient documentation

## 2017-03-01 DIAGNOSIS — M5432 Sciatica, left side: Secondary | ICD-10-CM

## 2017-03-01 MED ORDER — TRAMADOL HCL 50 MG PO TABS: 50.0000 mg | ORAL_TABLET | Freq: Four times a day (QID) | ORAL | 0 refills | Status: DC | PRN

## 2017-03-01 MED ORDER — TRAMADOL HCL 50 MG PO TABS
50.0000 mg | ORAL_TABLET | Freq: Once | ORAL | Status: AC
  Administered 2017-03-01: 50 mg via ORAL
  Filled 2017-03-01: qty 1

## 2017-03-01 NOTE — Discharge Instructions (Signed)
Do not take the narcotic while driving as it will make you sleepy. Continue to take ibuprofen.
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
## 2017-03-01 NOTE — ED Triage Notes (Signed)
Pt here with c/o left leg pain, she stated, 'my sciatica is acting up." ambulatory, NAD.

## 2017-03-08 ENCOUNTER — Encounter (HOSPITAL_COMMUNITY): Payer: Self-pay | Admitting: Emergency Medicine

## 2017-03-08 ENCOUNTER — Emergency Department (HOSPITAL_COMMUNITY)
Admission: EM | Admit: 2017-03-08 | Discharge: 2017-03-08 | Disposition: A | Discharge status: Home / Self Care | Payer: Medicaid Other | Attending: Emergency Medicine | Admitting: Emergency Medicine

## 2017-03-08 DIAGNOSIS — Z79899 Other long term (current) drug therapy: Secondary | ICD-10-CM | POA: Diagnosis not present

## 2017-03-08 DIAGNOSIS — Z87891 Personal history of nicotine dependence: Secondary | ICD-10-CM | POA: Insufficient documentation

## 2017-03-08 DIAGNOSIS — M5416 Radiculopathy, lumbar region: Secondary | ICD-10-CM | POA: Diagnosis not present

## 2017-03-08 DIAGNOSIS — M79605 Pain in left leg: Secondary | ICD-10-CM | POA: Diagnosis present

## 2017-03-08 DIAGNOSIS — Z7982 Long term (current) use of aspirin: Secondary | ICD-10-CM | POA: Diagnosis not present

## 2017-03-08 DIAGNOSIS — M5410 Radiculopathy, site unspecified: Secondary | ICD-10-CM

## 2017-03-08 MED ORDER — IBUPROFEN 600 MG PO TABS: 600.0000 mg | ORAL_TABLET | Freq: Four times a day (QID) | ORAL | 0 refills | Status: DC | PRN

## 2017-03-08 MED ORDER — IBUPROFEN 200 MG PO TABS
600.0000 mg | ORAL_TABLET | Freq: Once | ORAL | Status: AC
  Administered 2017-03-08: 600 mg via ORAL
  Filled 2017-03-08: qty 1

## 2017-03-08 MED ORDER — CYCLOBENZAPRINE HCL 5 MG PO TABS: 5.0000 mg | ORAL_TABLET | Freq: Three times a day (TID) | ORAL | 0 refills | Status: DC | PRN

## 2017-03-08 NOTE — ED Provider Notes (Signed)
[facility] DEPT Provider Note   CSN: [id number] Arrival date & time: 03/08/17  1821   By signing my name below, I, Delton Prairie, attest that this documentation has been prepared under the direction and in the presence of  Domenic Moras, PA-C. Electronically Signed: Delton Prairie, ED Scribe. 03/08/17. 7:07 PM.   History   Chief Complaint Chief Complaint  Patient presents with  . Leg Pain   The history is provided by the patient. No language interpreter was used.   HPI Comments:  Brianna Velez is a 41 y.o. female, with a PMHx of sciatica, who presents to the Emergency Department complaining of recurrent, intermittent episodes of "9/10" left lower extremity pain which she describes as an aching sensation which intially began 2 years ago and acutely flared 1 hour PTA today. She notes today's flare began 1 hour PTA while she was at work after bending over. She states her episodes of pain last for several hours. Pt notes her pain originates at her lower back and radiates down her right buttock to her right knee. Her pain is worse with bending and when lifting her lower extremity. No alleviating factors noted. Pt denies fevers, bowel/bladder incontinence, rash, abdominal pain, weakness or any other associated symptoms. She denies a hx of active cancer or a hx of IV drug use. Pt is ambulatory without difficulty. No other complaints noted.        Past Medical History:  Diagnosis Date  . Fibroids   . History of hiatal hernia   . Leg pain   . Migraine   . Sciatica     Patient Active Problem List   Diagnosis Date Noted  . Chronic pain syndrome 03/31/2016  . Screening for diabetes mellitus 12/31/2015  . S/P TAH (total abdominal hysterectomy) 09/29/2015  . Fibroid, uterine   . Menorrhagia with regular cycle   . Fibroid uterus 07/01/2015  . Left hip pain 11/06/2014    Past Surgical History:  Procedure Laterality Date  . ABDOMINAL HYSTERECTOMY  09/29/2015   Procedure: HYSTERECTOMY  ABDOMINAL;  Surgeon: Mora Bellman, MD;  Location: [facility];  Service: Gynecology;;  . BILATERAL SALPINGECTOMY Bilateral 09/29/2015   Procedure: BILATERAL SALPINGECTOMY;  Surgeon: Mora Bellman, MD;  Location: [facility];  Service: Gynecology;  Laterality: Bilateral;  . TUBAL LIGATION    . WISDOM TOOTH EXTRACTION      OB History    Gravida Para Term Preterm AB Living   2 2 2  0 0 2   SAB TAB Ectopic Multiple Live Births   0 0 0 0         Home Medications    Prior to Admission medications   Medication Sig Start Date End Date Taking? Authorizing Provider  Aspirin-Salicylamide-Caffeine (BC HEADACHE POWDER PO) Take 1 Package by mouth daily as needed (pain).    Historical Provider, MD  cyclobenzaprine (FLEXERIL) 5 MG tablet Take 1 tablet (5 mg total) by mouth 3 (three) times daily as needed for muscle spasms. 03/08/17   Domenic Moras, PA-C  diclofenac (VOLTAREN) 50 MG EC tablet Take 1 tablet (50 mg total) by mouth 2 (two) times daily. 02/16/17   Hope Bunnie Pion, NP  ibuprofen (ADVIL,MOTRIN) 600 MG tablet Take 1 tablet (600 mg total) by mouth every 6 (six) hours as needed. 03/08/17   Domenic Moras, PA-C  lidocaine (LIDODERM) 5 % Place 1 patch onto the skin daily. Remove & Discard patch within 12 hours or as directed by MD 02/16/17   Ashley Murrain, NP  traMADol (ULTRAM) 50 MG tablet Take 1 tablet (50 mg total) by mouth every 6 (six) hours as needed. 03/01/17   Hope Bunnie Pion, NP    Family History Family History  Problem Relation Age of Onset  . Diabetes Sister   . Heart disease Paternal Grandmother   . Hypertension Father   . Dementia Mother   . Hypertension Mother     Social History Social History  Substance Use Topics  . Smoking status: Former Smoker    Quit date: 12/05/1996  . Smokeless tobacco: Never Used  . Alcohol use No     Allergies   Prednisone   Review of Systems Review of Systems  Constitutional: Negative for fever.  Gastrointestinal: Negative for abdominal pain.  Musculoskeletal:  Positive for myalgias.  Skin: Negative for rash.  Neurological: Negative for weakness and numbness.     Physical Exam Updated Vital Signs BP 98/74   Pulse 89   Temp 98 F (36.7 C) (Oral)   Resp 12   LMP 08/15/2015 (Approximate)   SpO2 99%   Physical Exam  Constitutional: She is oriented to person, place, and time. She appears well-developed and well-nourished. No distress.  HENT:  Head: Normocephalic and atraumatic.  Eyes: Conjunctivae are normal.  Cardiovascular: Normal rate and intact distal pulses.   Pulmonary/Chest: Effort normal.  Abdominal: She exhibits no distension.  Musculoskeletal: She exhibits tenderness.  Tenderness noted to left para lumbar spinal muscle and left lumbosacral region. Positive straight leg raise. Equal strength to bilateral lower extremity. Distal pulses intact.  Neurological: She is alert and oriented to person, place, and time. She displays normal reflexes.  Skin: Skin is warm and dry.  Psychiatric: She has a normal mood and affect.  Nursing note and vitals reviewed.    ED Treatments / Results  DIAGNOSTIC STUDIES:  Oxygen Saturation is 99% on RA, normal by my interpretation.    COORDINATION OF CARE:  7:05 PM Discussed treatment plan with pt at bedside and pt agreed to plan.  Labs (all labs ordered are listed, but only abnormal results are displayed) Labs Reviewed - No data to display  EKG  EKG Interpretation None       Radiology No results found.  Procedures Procedures (including critical care time)  Medications Ordered in ED Medications  ibuprofen (ADVIL,MOTRIN) tablet 600 mg (not administered)     Initial Impression / Assessment and Plan / ED Course  I have reviewed the triage vital signs and the nursing notes.  Pertinent labs & imaging results that were available during my care of the patient were reviewed by me and considered in my medical decision making (see chart for details).     BP 98/74   Pulse 89   Temp  98 F (36.7 C) (Oral)   Resp 12   LMP 08/15/2015 (Approximate)   SpO2 99%    Final Clinical Impressions(s) / ED Diagnoses   Final diagnoses:  Radicular low back pain    New Prescriptions New Prescriptions   IBUPROFEN (ADVIL,MOTRIN) 600 MG TABLET    Take 1 tablet (600 mg total) by mouth every 6 (six) hours as needed.   I personally performed the services described in this documentation, which was scribed in my presence. The recorded information has been reviewed and is accurate.   7:08 PM Recurrent radicular back pain without red flags.  RICE therapy discussed.  Recommend f/u with pain management for her recurrent back pain.  I do not think narcotic pain medication is appropriate in  this setting.  Pt able to ambulate.     Domenic Moras, PA-C 03/08/17 1909    Fatima Blank, MD 03/08/17 2117

## 2017-03-08 NOTE — ED Triage Notes (Signed)
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 Ambulatory to triage.

## 2017-03-08 NOTE — Discharge Instructions (Signed)
Please talk to your provider to help you receive further care from a pain specialist for your recurrent back and leg pain.

## 2017-03-12 ENCOUNTER — Emergency Department (HOSPITAL_COMMUNITY)
Admission: EM | Admit: 2017-03-12 | Discharge: 2017-03-12 | Disposition: A | Discharge status: Home / Self Care | Payer: Medicaid Other | Attending: Emergency Medicine | Admitting: Emergency Medicine

## 2017-03-12 ENCOUNTER — Encounter (HOSPITAL_COMMUNITY): Payer: Self-pay | Admitting: Emergency Medicine

## 2017-03-12 DIAGNOSIS — Z7982 Long term (current) use of aspirin: Secondary | ICD-10-CM | POA: Insufficient documentation

## 2017-03-12 DIAGNOSIS — M5441 Lumbago with sciatica, right side: Secondary | ICD-10-CM | POA: Insufficient documentation

## 2017-03-12 DIAGNOSIS — Z87891 Personal history of nicotine dependence: Secondary | ICD-10-CM | POA: Diagnosis not present

## 2017-03-12 DIAGNOSIS — M5431 Sciatica, right side: Secondary | ICD-10-CM

## 2017-03-12 DIAGNOSIS — M545 Low back pain: Secondary | ICD-10-CM | POA: Diagnosis present

## 2017-03-12 MED ORDER — TRAMADOL HCL 50 MG PO TABS
50.0000 mg | ORAL_TABLET | Freq: Once | ORAL | Status: AC
  Administered 2017-03-12: 50 mg via ORAL
  Filled 2017-03-12: qty 1

## 2017-03-12 MED ORDER — TRAMADOL HCL 50 MG PO TABS: 50.0000 mg | ORAL_TABLET | Freq: Four times a day (QID) | ORAL | 0 refills | Status: DC | PRN

## 2017-03-12 NOTE — ED Triage Notes (Signed)
Pt c/o left low back pain that radiates down left leg ongoing for about 3 days. Pt reports, "I think my sciatica is acting up." Pt tried ibuprofen without relief. Reports some relief with muscle relaxers but is currently out of them.

## 2017-03-12 NOTE — ED Provider Notes (Signed)
[name] DEPT Provider Note   CSN: [id number] Arrival date & time: 03/12/17  1554  By signing my name below, I, Brianna Velez, attest that this documentation has been prepared under the direction and in the presence of [name], PA-C.  Electronically Signed: Julien Velez, ED Scribe. 03/12/17. 7:09 PM.    History   Chief Complaint Chief Complaint  Patient presents with  . Sciatica   The history is provided by the patient. No language interpreter was used.   HPI Comments: Brianna Velez is a 41 y.o. female who has a PMhx of sciatica and chronic leg pain presents to the Emergency Department complaining of moderate, recurrent, left leg pain that acutely flared this morning. She states she has left low back pain that radiates into her left leg which is consistent with her diagnosis of sciatica. She reports having a hx of sciatica for "awhile" now. She was seen on 03/08/17 for the same complaint where she was prescribed ibuprofen. Pt is seen at [facility].She does not have a hx of cancer or IV drug use. She denies bowel or bladder incontinence.  Past Medical History:  Diagnosis Date  . Fibroids   . History of hiatal hernia   . Leg pain   . Migraine   . Sciatica     Patient Active Problem List   Diagnosis Date Noted  . Chronic pain syndrome 03/31/2016  . Screening for diabetes mellitus 12/31/2015  . S/P TAH (total abdominal hysterectomy) 09/29/2015  . Fibroid, uterine   . Menorrhagia with regular cycle   . Fibroid uterus 07/01/2015  . Left hip pain 11/06/2014    Past Surgical History:  Procedure Laterality Date  . ABDOMINAL HYSTERECTOMY  09/29/2015   Procedure: HYSTERECTOMY ABDOMINAL;  Surgeon: Mora Bellman, MD;  Location: [facility];  Service: Gynecology;;  . BILATERAL SALPINGECTOMY Bilateral 09/29/2015   Procedure: BILATERAL SALPINGECTOMY;  Surgeon: Mora Bellman, MD;  Location: [facility];  Service: Gynecology;  Laterality: Bilateral;  . TUBAL LIGATION    . WISDOM  TOOTH EXTRACTION      OB History    Gravida Para Term Preterm AB Living   2 2 2  0 0 2   SAB TAB Ectopic Multiple Live Births   0 0 0 0         Home Medications    Prior to Admission medications   Medication Sig Start Date End Date Taking? Authorizing Provider  Aspirin-Salicylamide-Caffeine (BC HEADACHE POWDER PO) Take 1 Package by mouth daily as needed (pain).    Historical Provider, MD  cyclobenzaprine (FLEXERIL) 5 MG tablet Take 1 tablet (5 mg total) by mouth 3 (three) times daily as needed for muscle spasms. 03/08/17   Domenic Moras, PA-C  diclofenac (VOLTAREN) 50 MG EC tablet Take 1 tablet (50 mg total) by mouth 2 (two) times daily. 02/16/17   Hope Bunnie Pion, NP  ibuprofen (ADVIL,MOTRIN) 600 MG tablet Take 1 tablet (600 mg total) by mouth every 6 (six) hours as needed. 03/08/17   Domenic Moras, PA-C  lidocaine (LIDODERM) 5 % Place 1 patch onto the skin daily. Remove & Discard patch within 12 hours or as directed by MD 02/16/17   Ashley Murrain, NP  traMADol (ULTRAM) 50 MG tablet Take 1 tablet (50 mg total) by mouth every 6 (six) hours as needed. 03/01/17   Hope Bunnie Pion, NP    Family History Family History  Problem Relation Age of Onset  . Diabetes Sister   . Heart disease Paternal Grandmother   .  Hypertension Father   . Dementia Mother   . Hypertension Mother     Social History Social History  Substance Use Topics  . Smoking status: Former Smoker    Quit date: 12/05/1996  . Smokeless tobacco: Never Used  . Alcohol use No     Allergies   Prednisone   Review of Systems Review of Systems  Musculoskeletal: Positive for back pain and myalgias.     Physical Exam Updated Vital Signs BP 113/77 (BP Location: Left Arm)   Pulse 94   Temp 97.9 F (36.6 C) (Oral)   Resp 20   LMP 08/15/2015 (Approximate)   SpO2 100%   Physical Exam  Physical Exam  Constitutional: Pt appears well-developed and well-nourished. No distress.  HENT:  Head: Normocephalic and atraumatic.    Mouth/Throat: Oropharynx is clear and moist. No oropharyngeal exudate.  Eyes: Conjunctivae are normal.  Neck: Normal range of motion. Neck supple.  No meningismus Cardiovascular: Normal rate, regular rhythm and intact distal pulses.   Pulmonary/Chest: Effort normal and breath sounds normal. No respiratory distress. Pt has no wheezes.  Abdominal: Pt exhibits no distension Musculoskeletal:  Mild low back, paraspinal muscles tender to palpation, no bony CTLS spine tenderness, deformity, step-off, or crepitus Lymphadenopathy: Pt has no cervical adenopathy.  Neurological: Pt is alert and oriented Speech is clear and goal oriented, follows commands Normal 5/5 strength in upper and lower extremities bilaterally including dorsiflexion and plantar flexion, strong and equal grip strength Sensation intact Great toe extension intact Moves extremities without ataxia, coordination intact Ankle and knee jerk reflexes intact and symmetrical  Normal gait Normal balance Skin: Skin is warm and dry. No rash noted. Pt is not diaphoretic. No erythema.  Psychiatric: Pt has a normal mood and affect. Behavior is normal.  Nursing note and vitals reviewed.   ED Treatments / Results  DIAGNOSTIC STUDIES: Oxygen Saturation is 100% on RA, normal by my interpretation.  COORDINATION OF CARE:  7:05 PM Discussed treatment plan with pt at bedside and pt agreed to plan.   Labs (all labs ordered are listed, but only abnormal results are displayed) Labs Reviewed - No data to display  EKG  EKG Interpretation None       Radiology No results found.  Procedures Procedures (including critical care time)  Medications Ordered in ED Medications - No data to display   Initial Impression / Assessment and Plan / ED Course  I have reviewed the triage vital signs and the nursing notes.  Pertinent labs & imaging results that were available during my care of the patient were reviewed by me and considered in my  medical decision making (see chart for details).     Patient with back pain.  No neurological deficits and normal neuro exam.  Patient is ambulatory.  No loss of bowel or bladder control.  Doubt cauda equina.  Denies fever,  doubt epidural abscess or other lesion. Recommend back exercises, stretching, RICE, and will treat with a short course of ultram.  Encouraged the patient that there could be a need for additional workup and/or imaging such as MRI, if the symptoms do not resolve. Patient advised that if the back pain does not resolve, or radiates, this could progress to more serious conditions and is encouraged to follow-up with PCP or orthopedics within 2 weeks.     Final Clinical Impressions(s) / ED Diagnoses   Final diagnoses:  Sciatica of right side   I personally performed the services described in this documentation, which was  scribed in my presence. The recorded information has been reviewed and is accurate.    New Prescriptions Discharge Medication List as of 03/12/2017  7:11 PM       [name], PA-C 03/13/17 0011    Alfonzo Beers, MD 03/15/17 1534

## 2017-03-21 ENCOUNTER — Encounter (HOSPITAL_COMMUNITY): Payer: Self-pay | Admitting: Emergency Medicine

## 2017-03-21 ENCOUNTER — Emergency Department (HOSPITAL_COMMUNITY)
Admission: EM | Admit: 2017-03-21 | Discharge: 2017-03-21 | Disposition: A | Discharge status: Home / Self Care | Payer: Medicaid Other | Attending: Emergency Medicine | Admitting: Emergency Medicine

## 2017-03-21 DIAGNOSIS — Z87891 Personal history of nicotine dependence: Secondary | ICD-10-CM | POA: Diagnosis not present

## 2017-03-21 DIAGNOSIS — M5432 Sciatica, left side: Secondary | ICD-10-CM | POA: Diagnosis not present

## 2017-03-21 DIAGNOSIS — M79605 Pain in left leg: Secondary | ICD-10-CM | POA: Diagnosis present

## 2017-03-21 MED ORDER — TRAMADOL HCL 50 MG PO TABS: 50.0000 mg | ORAL_TABLET | Freq: Four times a day (QID) | ORAL | 0 refills | Status: DC | PRN

## 2017-03-21 MED ORDER — IBUPROFEN 800 MG PO TABS: 800.0000 mg | ORAL_TABLET | Freq: Three times a day (TID) | ORAL | 0 refills | Status: DC | PRN

## 2017-03-21 NOTE — ED Triage Notes (Signed)
Pt sts left sided sciatica pain with hx of same in past

## 2017-03-21 NOTE — Discharge Instructions (Signed)
You have been seen in the Emergency Department (ED)  today for back pain.  Your workup and exam have not shown any acute abnormalities and you are likely suffering from muscle strain or possible problems with your discs, but there is no treatment that will fix your symptoms at this time.  Please take Motrin (ibuprofen) as needed for your pain according to the instructions written on the box.  Alternatively, for the next five days you can take 600mg  three times daily with meals (it may upset your stomach).  Take Tramadol as prescribed for severe pain. Do not drink alcohol, drive or participate in any other potentially dangerous activities while taking this medication as it may make you sleepy. Do not take this medication with any other sedating medications, either prescription or over-the-counter. If you were prescribed Percocet or Vicodin, do not take these with acetaminophen (Tylenol) as it is already contained within these medications.   This medication is an opiate (or narcotic) pain medication and can be habit forming.  Use it as little as possible to achieve adequate pain control.  Do not use or use it with extreme caution if you have a history of opiate abuse or dependence. This medication is intended for your use only - do not give any to anyone else and keep it in a secure place where nobody else, especially children, have access to it.  It will also cause or worsen constipation, so you may want to consider taking an over-the-counter stool softener while you are taking this medication.  Please follow up with your doctor as soon as possible regarding today's ED visit and your back pain.  Return to the ED for worsening back pain, fever, weakness or numbness of either leg, or if you develop either (1) an inability to urinate or have bowel movements, or (2) loss of your ability to control your bathroom functions (if you start having "accidents"), or if you develop other new symptoms that concern you.

## 2017-03-21 NOTE — ED Provider Notes (Signed)
Emergency Department Provider Note  By signing my name below, I, Collene Leyden, attest that this documentation has been prepared under the direction and in the presence of Margette Fast, MD. Electronically Signed: Collene Leyden, Scribe. 03/21/17. 6:27 PM.   I have reviewed the triage vital signs and the nursing notes.   HISTORY  Chief Complaint Leg Pain   HPI Comments: Brianna Velez is a 41 y.o. female with a history of sciatica, who presents to the Emergency Department complaining of sudden-onset, constant left leg pain that began earlier today. Patient states she has had left leg pain that began at work this afternoon. Patient states she has a history of sciatica, pain feels similar. Patient denies any injury. Patient reports associated numbness, tingling, and lower back pain. No modifying factors indicated. Patient reports receiving tramadol in the past with some relief. Patient denies groin numbness, dysuria, vaginal bleeding, vaginal discharge, or any other symptoms.    Past Medical History:  Diagnosis Date  . Fibroids   . History of hiatal hernia   . Leg pain   . Migraine   . Sciatica     Patient Active Problem List   Diagnosis Date Noted  . Chronic pain syndrome 03/31/2016  . Screening for diabetes mellitus 12/31/2015  . S/P TAH (total abdominal hysterectomy) 09/29/2015  . Fibroid, uterine   . Menorrhagia with regular cycle   . Fibroid uterus 07/01/2015  . Left hip pain 11/06/2014    Past Surgical History:  Procedure Laterality Date  . ABDOMINAL HYSTERECTOMY  09/29/2015   Procedure: HYSTERECTOMY ABDOMINAL;  Surgeon: Mora Bellman, MD;  Location: [facility];  Service: Gynecology;;  . BILATERAL SALPINGECTOMY Bilateral 09/29/2015   Procedure: BILATERAL SALPINGECTOMY;  Surgeon: Mora Bellman, MD;  Location: [facility];  Service: Gynecology;  Laterality: Bilateral;  . TUBAL LIGATION    . WISDOM TOOTH EXTRACTION      Current Outpatient Rx  . Order #: [id number] Class:  Historical Med  . Order #: [id number] Class: Print  . Order #: [id number] Class: Print  . Order #: [id number] Class: Print  . Order #: [id number] Class: Print  . Order #: [id number] Class: Print    Allergies Prednisone  Family History  Problem Relation Age of Onset  . Diabetes Sister   . Heart disease Paternal Grandmother   . Hypertension Father   . Dementia Mother   . Hypertension Mother     Social History Social History  Substance Use Topics  . Smoking status: Former Smoker    Quit date: 12/05/1996  . Smokeless tobacco: Never Used  . Alcohol use No    Review of Systems Constitutional: No fever/chills Eyes: No visual changes. ENT: No sore throat. Cardiovascular: Denies chest pain. Respiratory: Denies shortness of breath. Gastrointestinal: No abdominal pain.  No nausea, no vomiting.  No diarrhea.  No constipation. Genitourinary: Negative for dysuria. Musculoskeletal: Positive for back pain that radiates the left leg.  Skin: Negative for rash. Neurological: Negative for headaches or focal weakness. Left leg numbness.   10-point ROS otherwise negative.  ____________________________________________   PHYSICAL EXAM:  VITAL SIGNS: ED Triage Vitals [03/21/17 1757]  Enc Vitals Group     BP 111/77     Pulse Rate 90     Resp 18     Temp 97.6 F (36.4 C)     Temp Source Oral     SpO2 100 %     Pain Score 9   Constitutional: Alert and oriented. Well appearing and in no acute distress. Eyes: Conjunctivae are normal.  Head: Atraumatic. Nose: No congestion/rhinnorhea. Mouth/Throat: Mucous membranes are moist.  Oropharynx non-erythematous. Neck: No stridor.  Cardiovascular: Normal rate, regular rhythm. Good peripheral circulation. Grossly normal heart sounds.   Respiratory: Normal respiratory effort.  No retractions. Lungs CTAB. Gastrointestinal: Soft and nontender. No distention.  Musculoskeletal: No lower extremity tenderness nor edema. No gross deformities of extremities.  No midline lumbar spine tenderness.  Neurologic:  Normal speech and language. No gross focal neurologic deficits are appreciated. 2+ patellar reflexes bilaterally. Normal strength. Gait with slight limp but otherwise normal.  Skin:  Skin is warm, dry and intact. No rash noted. Psychiatric: Mood and affect are flat. Speech and behavior are normal.  ____________________________________________   PROCEDURES  Procedure(s) performed:   Procedures  None ____________________________________________   INITIAL IMPRESSION / ASSESSMENT AND PLAN / ED COURSE  Pertinent labs & imaging results that were available during my care of the patient were reviewed by me and considered in my medical decision making (see chart for details).  Patient presents to the ED for evaluation of left leg pain/tingling consistent with sciatica. Patient has had previous bouts of sciatica that feel the same. No midline spine tenderness or red-flag symptoms to increase suspicion for spine emergency. No indication for imaging at this time. Discharged with Tramadol and plan for NSAIDs.   At this time, I do not feel there is any life-threatening condition present. I have reviewed and discussed all results (EKG, imaging, lab, urine as appropriate), exam findings with patient. I have reviewed nursing notes and appropriate previous records.  I feel the patient is safe to be discharged home without further emergent workup. Discussed usual and customary return precautions. Patient and family (if present) verbalize understanding and are comfortable with this plan.  Patient will follow-up with their primary care provider. If they do not have a primary care provider, information for follow-up has been provided to them. All questions have been answered.   ____________________________________________  FINAL CLINICAL IMPRESSION(S) / ED DIAGNOSES  Final diagnoses:  Left leg pain  Sciatica of left side     MEDICATIONS GIVEN DURING  THIS VISIT:  None  NEW OUTPATIENT MEDICATIONS STARTED DURING THIS VISIT:  Tramadol Motrin 800 mg   I personally performed the services described in this documentation, which was scribed in my presence. The recorded information has been reviewed and is accurate.    Note:  This document was prepared using Dragon voice recognition software and may include unintentional dictation errors.  Nanda Quinton, MD Emergency Medicine    Margette Fast, MD 03/22/17 [phone]

## 2017-03-30 ENCOUNTER — Emergency Department (HOSPITAL_COMMUNITY)
Admission: EM | Admit: 2017-03-30 | Discharge: 2017-03-30 | Disposition: A | Discharge status: Home / Self Care | Payer: Medicaid Other | Attending: Emergency Medicine | Admitting: Emergency Medicine

## 2017-03-30 ENCOUNTER — Encounter (HOSPITAL_COMMUNITY): Payer: Self-pay | Admitting: Nurse Practitioner

## 2017-03-30 DIAGNOSIS — M545 Low back pain: Secondary | ICD-10-CM | POA: Diagnosis present

## 2017-03-30 DIAGNOSIS — Z7982 Long term (current) use of aspirin: Secondary | ICD-10-CM | POA: Diagnosis not present

## 2017-03-30 DIAGNOSIS — Z87891 Personal history of nicotine dependence: Secondary | ICD-10-CM | POA: Insufficient documentation

## 2017-03-30 DIAGNOSIS — M79605 Pain in left leg: Secondary | ICD-10-CM | POA: Insufficient documentation

## 2017-03-30 DIAGNOSIS — Z79899 Other long term (current) drug therapy: Secondary | ICD-10-CM | POA: Diagnosis not present

## 2017-03-30 DIAGNOSIS — M5440 Lumbago with sciatica, unspecified side: Secondary | ICD-10-CM | POA: Diagnosis not present

## 2017-03-30 MED ORDER — ACETAMINOPHEN 500 MG PO TABS
1000.0000 mg | ORAL_TABLET | Freq: Once | ORAL | Status: AC
  Administered 2017-03-30: 1000 mg via ORAL
  Filled 2017-03-30: qty 2

## 2017-03-30 NOTE — ED Notes (Signed)
Patient Alert and oriented X4. Stable and ambulatory. Patient verbalized understanding of the discharge instructions.  Patient belongings were taken by the patient.  

## 2017-03-30 NOTE — ED Notes (Signed)
ED Provider at bedside. 

## 2017-03-30 NOTE — ED Provider Notes (Signed)
[place] DEPT Provider Note   CSN: [id number] Arrival date & time: 03/30/17  1758   By signing my name below, I, Delton Prairie, attest that this documentation has been prepared under the direction and in the presence of Leo Grosser, MD  Electronically Signed: Delton Prairie, ED Scribe. 03/30/17. 6:43 PM.   History   Chief Complaint Chief Complaint  Patient presents with  . Leg Pain    HPI Comments:  Brianna Velez is a 41 y.o. female, with a PMHx of sciatica, who presents to the Emergency Department complaining of chronic, recurrent episodes of left lower extremity pain with the latest episode of pain beginning 30 minutes PTA. Pt has had multiple visits to the ED in the past 6 months for her sciatic pain. No alleviating or aggravating factors noted at this time. Pt has an appointment for a PCP scheduled in 04/2017. No other complaints noted.   The history is provided by the patient. No language interpreter was used.  Leg Pain   This is a chronic problem. The current episode started less than 1 hour ago. The problem occurs constantly. The problem has not changed since onset.The pain is present in the left lower leg and left upper leg. The pain is moderate. Pertinent negatives include no numbness. She has tried nothing for the symptoms.    Past Medical History:  Diagnosis Date  . Fibroids   . History of hiatal hernia   . Leg pain   . Migraine   . Sciatica     Patient Active Problem List   Diagnosis Date Noted  . Chronic pain syndrome 03/31/2016  . Screening for diabetes mellitus 12/31/2015  . S/P TAH (total abdominal hysterectomy) 09/29/2015  . Fibroid, uterine   . Menorrhagia with regular cycle   . Fibroid uterus 07/01/2015  . Left hip pain 11/06/2014    Past Surgical History:  Procedure Laterality Date  . ABDOMINAL HYSTERECTOMY  09/29/2015   Procedure: HYSTERECTOMY ABDOMINAL;  Surgeon: Mora Bellman, MD;  Location: [facility];  Service: Gynecology;;  . BILATERAL  SALPINGECTOMY Bilateral 09/29/2015   Procedure: BILATERAL SALPINGECTOMY;  Surgeon: Mora Bellman, MD;  Location: [facility];  Service: Gynecology;  Laterality: Bilateral;  . TUBAL LIGATION    . WISDOM TOOTH EXTRACTION      OB History    Gravida Para Term Preterm AB Living   2 2 2  0 0 2   SAB TAB Ectopic Multiple Live Births   0 0 0 0         Home Medications    Prior to Admission medications   Medication Sig Start Date End Date Taking? Authorizing Provider  Aspirin-Salicylamide-Caffeine (BC HEADACHE POWDER PO) Take 1 Package by mouth daily as needed (pain).    Historical Provider, MD  cyclobenzaprine (FLEXERIL) 5 MG tablet Take 1 tablet (5 mg total) by mouth 3 (three) times daily as needed for muscle spasms. 03/08/17   Domenic Moras, PA-C  diclofenac (VOLTAREN) 50 MG EC tablet Take 1 tablet (50 mg total) by mouth 2 (two) times daily. 02/16/17   Hope Bunnie Pion, NP  ibuprofen (ADVIL,MOTRIN) 800 MG tablet Take 1 tablet (800 mg total) by mouth every 8 (eight) hours as needed. 03/21/17   Margette Fast, MD  lidocaine (LIDODERM) 5 % Place 1 patch onto the skin daily. Remove & Discard patch within 12 hours or as directed by MD 02/16/17   Ashley Murrain, NP  traMADol (ULTRAM) 50 MG tablet Take 1 tablet (50 mg total) by mouth  every 6 (six) hours as needed. 03/21/17   Margette Fast, MD    Family History Family History  Problem Relation Age of Onset  . Diabetes Sister   . Heart disease Paternal Grandmother   . Hypertension Father   . Dementia Mother   . Hypertension Mother     Social History Social History  Substance Use Topics  . Smoking status: Former Smoker    Quit date: 12/05/1996  . Smokeless tobacco: Never Used  . Alcohol use No     Allergies   Prednisone   Review of Systems Review of Systems  Musculoskeletal: Positive for myalgias.  Neurological: Negative for numbness.  All other systems reviewed and are negative.  Physical Exam Updated Vital Signs BP 118/74   Pulse 87   Temp  98.3 F (36.8 C) (Oral)   Resp 17   LMP 08/15/2015 (Approximate)   SpO2 100%   Physical Exam  Constitutional: She is oriented to person, place, and time. She appears well-developed and well-nourished. No distress.  HENT:  Head: Normocephalic.  Nose: Nose normal.  Eyes: Conjunctivae are normal.  Neck: Neck supple. No tracheal deviation present.  Cardiovascular: Normal rate and regular rhythm.   Pulmonary/Chest: Effort normal. No respiratory distress.  Abdominal: Soft. She exhibits no distension.  Neurological: She is alert and oriented to person, place, and time.  Skin: Skin is warm and dry.  Psychiatric: She has a normal mood and affect.    ED Treatments / Results  DIAGNOSTIC STUDIES:  Oxygen Saturation is 100% on RA, normal by my interpretation.    COORDINATION OF CARE:  6:40 PM Discussed treatment plan with pt at bedside and pt agreed to plan.  Labs (all labs ordered are listed, but only abnormal results are displayed) Labs Reviewed - No data to display  EKG  EKG Interpretation None       Radiology No results found.  Procedures Procedures (including critical care time)  Medications Ordered in ED Medications  acetaminophen (TYLENOL) tablet 1,000 mg (1,000 mg Oral Given 03/30/17 1853)     Initial Impression / Assessment and Plan / ED Course  I have reviewed the triage vital signs and the nursing notes.  Pertinent labs & imaging results that were available during my care of the patient were reviewed by me and considered in my medical decision making (see chart for details).     41 year old female presents with chronic low back pain and radicular symptoms. She has been seen here 12 times in the last 6 months with 0 admissions. She has been referred to primary care but has not made appointments. No red flags. Patient was recommended to take short course of scheduled NSAIDs and engage in early mobility as definitive treatment.   Final Clinical Impressions(s) /  ED Diagnoses   Final diagnoses:  Low back pain with sciatica, sciatica laterality unspecified, unspecified back pain laterality, unspecified chronicity    New Prescriptions New Prescriptions   No medications on file  I personally performed the services described in this documentation, which was scribed in my presence. The recorded information has been reviewed and is accurate.      Leo Grosser, MD 03/30/17 [phone]

## 2017-03-30 NOTE — ED Triage Notes (Signed)
Pt presents with L leg pain. The pain began about 30 minutes ago. It feels like sciatica. She reports a history of chronic leg pain, sciatica. She did not try anything for the pain.

## 2017-04-05 ENCOUNTER — Emergency Department (HOSPITAL_COMMUNITY)
Admission: EM | Admit: 2017-04-05 | Discharge: 2017-04-05 | Disposition: A | Discharge status: Home / Self Care | Payer: Medicaid Other

## 2017-04-05 NOTE — ED Notes (Signed)
Have called pt several times in lobby with no answer.

## 2017-04-17 ENCOUNTER — Encounter (HOSPITAL_COMMUNITY): Payer: Self-pay | Admitting: Nurse Practitioner

## 2017-04-17 ENCOUNTER — Emergency Department (HOSPITAL_COMMUNITY)
Admission: EM | Admit: 2017-04-17 | Discharge: 2017-04-17 | Disposition: A | Discharge status: Home / Self Care | Payer: Medicaid Other | Attending: Emergency Medicine | Admitting: Emergency Medicine

## 2017-04-17 DIAGNOSIS — M5410 Radiculopathy, site unspecified: Secondary | ICD-10-CM

## 2017-04-17 DIAGNOSIS — Z87891 Personal history of nicotine dependence: Secondary | ICD-10-CM | POA: Insufficient documentation

## 2017-04-17 DIAGNOSIS — Z7982 Long term (current) use of aspirin: Secondary | ICD-10-CM | POA: Diagnosis not present

## 2017-04-17 DIAGNOSIS — M545 Low back pain: Secondary | ICD-10-CM | POA: Diagnosis not present

## 2017-04-17 MED ORDER — TRAMADOL HCL 50 MG PO TABS
50.0000 mg | ORAL_TABLET | Freq: Once | ORAL | Status: AC
  Administered 2017-04-17: 50 mg via ORAL
  Filled 2017-04-17: qty 1

## 2017-04-17 NOTE — ED Notes (Signed)
EDP at bedside  

## 2017-04-17 NOTE — Discharge Instructions (Signed)
It is very important that you follow up with your primary care provider or pain management clinic for further discussion and evaluation of your back pain.   It was my pleasure taking care of you today!   You have been seen in the Emergency Department today for back pain.   Tylenol or ibuprofen as needed for pain. In addition to this, use ice and/or heat for additional pain relief.   COLD THERAPY DIRECTIONS:  Ice or gel packs can be used to reduce both pain and swelling. Ice is the most helpful within the first 24 to 48 hours after an injury or flareup from overusing a muscle or joint.  Ice is effective, has very few side effects, and is safe for most people to use.    Return to the ED for worsening back pain, fever, weakness or numbness of either leg, or if you develop either (1) an inability to urinate or have bowel movements, or (2) loss of your ability to control your bathroom functions (if you start having "accidents"), or if you develop other new symptoms that concern you.

## 2017-04-17 NOTE — ED Provider Notes (Signed)
[facility] DEPT Provider Note   CSN: [id number] Arrival date & time: 04/17/17  1521  By signing my name below, I, Hansel Feinstein and Alexia Perkins-Maupin, attest that this documentation has been prepared under the direction and in the presence of  [facility], PA-C. Electronically Signed: Catarina Hartshorn, ED Scribe. 04/17/17. 5:20 PM.   History   Chief Complaint Chief Complaint  Patient presents with  . Back Pain    HPI Brianna Velez is a 41 y.o. female with h/o sciatica who presents to the Emergency Department complaining of moderate, sharp, acute on chronic left lower back pain with shooting radiation down the left leg to the left great toe that began worsening last week. Pt states associated occasional tingling to the left leg. She denies recent trauma, falls, heavy lifting or injury. Pt reports her current symptoms feel the same as her chronic back pain exacerbations. Walking exacerbates her pain and muscle relaxants provided no relief. She states she is working on f/u with PCP. She denies difficulty urinating, frequency, urgency, hematuria, dysuria, bowel or bladder incontinence.   The history is provided by the patient. No language interpreter was used.    Past Medical History:  Diagnosis Date  . Fibroids   . History of hiatal hernia   . Leg pain   . Migraine   . Sciatica     Patient Active Problem List   Diagnosis Date Noted  . Chronic pain syndrome 03/31/2016  . Screening for diabetes mellitus 12/31/2015  . S/P TAH (total abdominal hysterectomy) 09/29/2015  . Fibroid, uterine   . Menorrhagia with regular cycle   . Fibroid uterus 07/01/2015  . Left hip pain 11/06/2014    Past Surgical History:  Procedure Laterality Date  . ABDOMINAL HYSTERECTOMY  09/29/2015   Procedure: HYSTERECTOMY ABDOMINAL;  Surgeon: Mora Bellman, MD;  Location: [facility];  Service: Gynecology;;  . BILATERAL SALPINGECTOMY Bilateral 09/29/2015   Procedure: BILATERAL  SALPINGECTOMY;  Surgeon: Mora Bellman, MD;  Location: [facility];  Service: Gynecology;  Laterality: Bilateral;  . TUBAL LIGATION    . WISDOM TOOTH EXTRACTION      OB History    Gravida Para Term Preterm AB Living   2 2 2  0 0 2   SAB TAB Ectopic Multiple Live Births   0 0 0 0         Home Medications    Prior to Admission medications   Medication Sig Start Date End Date Taking? Authorizing Provider  Aspirin-Salicylamide-Caffeine (BC HEADACHE POWDER PO) Take 1 Package by mouth daily as needed (pain).    [provider]  cyclobenzaprine (FLEXERIL) 5 MG tablet Take 1 tablet (5 mg total) by mouth 3 (three) times daily as needed for muscle spasms. 03/08/17   Domenic Moras, PA-C  diclofenac (VOLTAREN) 50 MG EC tablet Take 1 tablet (50 mg total) by mouth 2 (two) times daily. 02/16/17   Ashley Murrain, NP  ibuprofen (ADVIL,MOTRIN) 800 MG tablet Take 1 tablet (800 mg total) by mouth every 8 (eight) hours as needed. 03/21/17   Long, Wonda Olds, MD  lidocaine (LIDODERM) 5 % Place 1 patch onto the skin daily. Remove & Discard patch within 12 hours or as directed by MD 02/16/17   Ashley Murrain, NP  traMADol (ULTRAM) 50 MG tablet Take 1 tablet (50 mg total) by mouth every 6 (six) hours as needed. 03/21/17   Long, Wonda Olds, MD    Family History Family History  Problem Relation Age of Onset  .  Diabetes Sister   . Heart disease Paternal Grandmother   . Hypertension Father   . Dementia Mother   . Hypertension Mother     Social History Social History  Substance Use Topics  . Smoking status: Former Smoker    Quit date: 12/05/1996  . Smokeless tobacco: Never Used  . Alcohol use No     Allergies   Prednisone   Review of Systems Review of Systems  Gastrointestinal:       No bowel incontinence   Genitourinary: Negative for difficulty urinating, dysuria, frequency, hematuria and urgency.       No bladder incontinence   Musculoskeletal: Positive for back pain and myalgias.  Neurological:  Negative for numbness.       +tingling left leg     Physical Exam Updated Vital Signs BP 110/70 (BP Location: Right Arm)   Pulse 88   Temp 98.6 F (37 C) (Oral)   Resp 16   LMP 08/15/2015 (Approximate)   SpO2 100%   Physical Exam  Constitutional: She appears well-developed and well-nourished. No distress.  HENT:  Head: Normocephalic and atraumatic.  Neck: Neck supple.  Cardiovascular: Normal rate, regular rhythm and normal heart sounds.   No murmur heard. Pulmonary/Chest: Effort normal and breath sounds normal. No respiratory distress. She has no wheezes. She has no rales.  Musculoskeletal: Normal range of motion.  TTP of left lumbar paraspinal and lumbosacral regions  Positive straight leg raise on left, negative on the right No midline C/T/L spine tenderness  5/5 muscle strength in bilateral lower extremities   Neurological: She is alert.  Bilateral lower extremities NVI  Skin: Skin is warm and dry.  Nursing note and vitals reviewed.    ED Treatments / Results  DIAGNOSTIC STUDIES: Oxygen Saturation is 100% on RA, normal by my interpretation.   COORDINATION OF CARE: 5:00 PM-Discussed next steps with pt. Pt verbalized understanding and is agreeable with the plan. Will be prescribed antibiotics.   Labs (all labs ordered are listed, but only abnormal results are displayed) Labs Reviewed - No data to display  EKG  EKG Interpretation None       Radiology No results found.  Procedures Procedures (including critical care time)  Medications Ordered in ED Medications  traMADol (ULTRAM) tablet 50 mg (50 mg Oral Given 04/17/17 1714)     Initial Impression / Assessment and Plan / ED Course  I have reviewed the triage vital signs and the nursing notes.  Pertinent labs & imaging results that were available during my care of the patient were reviewed by me and considered in my medical decision making (see chart for details).    Brianna Velez is a 41 y.o.  female who presents to ED for acute on chronic back pain. Patient demonstrates no lower extremity weakness, saddle anesthesia, bowel or bladder incontinence. Lower extremities are neurovascularly intact and patient is ambulating without difficulty. Patient is here routinely for similar exacerbations -- I have stressed the importance of PCP or pain management follow up. Patient voiced understanding and agreement with plan. All questions answered.   Final Clinical Impressions(s) / ED Diagnoses   Final diagnoses:  Radicular low back pain    New Prescriptions Discharge Medication List as of 04/17/2017  5:06 PM     I personally performed the services described in this documentation, which was scribed in my presence. The recorded information has been reviewed and is accurate.     Claudis Giovanelli, Ozella Almond, PA-C 04/17/17 1737    Varney Biles,  MD 04/17/17 1835

## 2017-04-17 NOTE — ED Triage Notes (Signed)
Pt presents with c/o lower back pain. The pain began last week. She denies any heavy exertion or noted activity prior to onset. The pain radiates down her l left. It feels like when shes had sciatica in the past. Shes tried rest, ice, heat, ibuprofen, naproxen with no relief.

## 2017-04-20 DIAGNOSIS — M25552 Pain in left hip: Secondary | ICD-10-CM | POA: Diagnosis not present

## 2017-04-20 DIAGNOSIS — M545 Low back pain: Secondary | ICD-10-CM | POA: Diagnosis not present

## 2017-06-16 ENCOUNTER — Encounter (HOSPITAL_COMMUNITY): Payer: Self-pay

## 2017-06-16 ENCOUNTER — Emergency Department (HOSPITAL_COMMUNITY): Payer: Medicaid Other

## 2017-06-16 ENCOUNTER — Emergency Department (HOSPITAL_COMMUNITY)
Admission: EM | Admit: 2017-06-16 | Discharge: 2017-06-17 | Disposition: A | Discharge status: Home / Self Care | Payer: Medicaid Other | Attending: Emergency Medicine | Admitting: Emergency Medicine

## 2017-06-16 DIAGNOSIS — Z87891 Personal history of nicotine dependence: Secondary | ICD-10-CM | POA: Diagnosis not present

## 2017-06-16 DIAGNOSIS — R140 Abdominal distension (gaseous): Secondary | ICD-10-CM | POA: Insufficient documentation

## 2017-06-16 DIAGNOSIS — Z79899 Other long term (current) drug therapy: Secondary | ICD-10-CM | POA: Diagnosis not present

## 2017-06-16 DIAGNOSIS — R601 Generalized edema: Secondary | ICD-10-CM | POA: Diagnosis present

## 2017-06-16 LAB — CBC WITH DIFFERENTIAL/PLATELET
Basophils Absolute: 0 10*3/uL (ref 0.0–0.1)
Basophils Relative: 0 %
Eosinophils Absolute: 0.1 10*3/uL (ref 0.0–0.7)
Eosinophils Relative: 3 %
HCT: 39.6 % (ref 36.0–46.0)
Hemoglobin: 12.9 g/dL (ref 12.0–15.0)
Lymphocytes Relative: 30 %
Lymphs Abs: 1.3 10*3/uL (ref 0.7–4.0)
MCH: 31.4 pg (ref 26.0–34.0)
MCHC: 32.6 g/dL (ref 30.0–36.0)
MCV: 96.4 fL (ref 78.0–100.0)
Monocytes Absolute: 0.3 10*3/uL (ref 0.1–1.0)
Monocytes Relative: 8 %
Neutro Abs: 2.6 10*3/uL (ref 1.7–7.7)
Neutrophils Relative %: 59 %
Platelets: 206 10*3/uL (ref 150–400)
RBC: 4.11 MIL/uL (ref 3.87–5.11)
RDW: 12 % (ref 11.5–15.5)
WBC: 4.4 10*3/uL (ref 4.0–10.5)

## 2017-06-16 LAB — COMPREHENSIVE METABOLIC PANEL
ALT: 19 U/L (ref 14–54)
AST: 27 U/L (ref 15–41)
Albumin: 3.9 g/dL (ref 3.5–5.0)
Alkaline Phosphatase: 46 U/L (ref 38–126)
Anion gap: 8 (ref 5–15)
BUN: 6 mg/dL (ref 6–20)
CO2: 25 mmol/L (ref 22–32)
Calcium: 8.9 mg/dL (ref 8.9–10.3)
Chloride: 105 mmol/L (ref 101–111)
Creatinine, Ser: 0.65 mg/dL (ref 0.44–1.00)
GFR calc Af Amer: >60 mL/min (ref >60)
GFR calc non Af Amer: >60 mL/min (ref >60)
Glucose, Bld: 94 mg/dL (ref 65–99)
Potassium: 3.9 mmol/L (ref 3.5–5.1)
Sodium: 138 mmol/L (ref 135–145)
Total Bilirubin: 0.4 mg/dL (ref 0.3–1.2)
Total Protein: 7.2 g/dL (ref 6.5–8.1)

## 2017-06-16 LAB — BRAIN NATRIURETIC PEPTIDE: B Natriuretic Peptide: 11 pg/mL (ref 0.0–100.0)

## 2017-06-16 LAB — I-STAT BETA HCG BLOOD, ED (MC, WL, AP ONLY): I-stat hCG, quantitative: <5 m[IU]/mL (ref <5)

## 2017-06-16 LAB — SEDIMENTATION RATE: Sed Rate: 24 mm/hr — ABNORMAL HIGH (ref 0–22)

## 2017-06-16 MED ORDER — IOPAMIDOL (ISOVUE-300) INJECTION 61%
INTRAVENOUS | Status: AC
  Administered 2017-06-16: 80 mL
  Filled 2017-06-16: qty 100

## 2017-06-16 NOTE — ED Triage Notes (Signed)
Pt reports she has noticed swelling to "all over my body" for one month. Her abdomen appears swollen and she reports feeling a sensation to her feet. Denies pain anywhere.

## 2017-06-16 NOTE — ED Notes (Addendum)
Pt updated via iConnect.

## 2017-06-16 NOTE — ED Provider Notes (Signed)
[facility] DEPT Provider Note   CSN: [id number] Arrival date & time: 06/16/17  1608     History   Chief Complaint Chief Complaint  Patient presents with  . Body Swelling    HPI Brianna Velez is a 41 y.o. female G2P2 with a hx of subtotal hysterectomy, migraine headache, sciatica presents to the Emergency Department complaining of gradual, persistent, progressively worsening "body swelling" onset 3 mos ago with increasing abd distension in the last 4 weeks.  Pt reports she initially noted the bloating in her abd, followed by her hands and feet.  Pt reports eating makes her abd distension worse and not eating makes it some better.  It is never accompanied by pain.  Pt reports she eats a diet mainly of fried foods.  She reports 30lb weight gain in the last 4 weeks.  She reports being seen for this several weeks ago with normal blood work, but no imaging and no additional follow-up.  No arthralgias, myalgias, palpitations, chest pain, SOB. Associated symptoms include foot and leg swelling.  Pt denies fever, chills, weight loss, SOB, night sweats, international travel.  Pt denies EtOH usage, smoking or drug usage.  Patient denies fatigue, changes of her skin or hair, heat or cold intolerance.   The history is provided by the patient and medical records. No language interpreter was used.    Past Medical History:  Diagnosis Date  . Fibroids   . History of hiatal hernia   . Leg pain   . Migraine   . Sciatica     Patient Active Problem List   Diagnosis Date Noted  . Chronic pain syndrome 03/31/2016  . Screening for diabetes mellitus 12/31/2015  . S/P TAH (total abdominal hysterectomy) 09/29/2015  . Fibroid, uterine   . Menorrhagia with regular cycle   . Fibroid uterus 07/01/2015  . Left hip pain 11/06/2014    Past Surgical History:  Procedure Laterality Date  . ABDOMINAL HYSTERECTOMY  09/29/2015   Procedure: HYSTERECTOMY ABDOMINAL;  Surgeon: Mora Bellman, MD;  Location: [facility];  Service: Gynecology;;  . BILATERAL SALPINGECTOMY Bilateral 09/29/2015   Procedure: BILATERAL SALPINGECTOMY;  Surgeon: Mora Bellman, MD;  Location: [facility];  Service: Gynecology;  Laterality: Bilateral;  . TUBAL LIGATION    . WISDOM TOOTH EXTRACTION      OB History    Gravida Para Term Preterm AB Living   2 2 2  0 0 2   SAB TAB Ectopic Multiple Live Births   0 0 0 0         Home Medications    Prior to Admission medications   Medication Sig Start Date End Date Taking? Authorizing Provider  gabapentin (NEURONTIN) 300 MG capsule Take 300 mg by mouth 3 (three) times daily. 05/26/17  Yes [provider]  naproxen (NAPROSYN) 500 MG tablet Take 500 mg by mouth 2 (two) times daily. 04/20/17  Yes [provider]    Family History Family History  Problem Relation Age of Onset  . Diabetes Sister   . Heart disease Paternal Grandmother   . Hypertension Father   . Dementia Mother   . Hypertension Mother     Social History Social History  Substance Use Topics  . Smoking status: Former Smoker    Quit date: 12/05/1996  . Smokeless tobacco: Never Used  . Alcohol use No     Allergies   Prednisone   Review of Systems Review of Systems  Constitutional: Negative for chills and fever.  HENT: Negative for  congestion, rhinorrhea and sneezing.   Eyes: Negative for visual disturbance.  Respiratory: Negative for chest tightness.   Cardiovascular: Positive for leg swelling. Negative for palpitations.  Gastrointestinal: Positive for abdominal distention. Negative for abdominal pain, diarrhea, nausea and vomiting.  Endocrine: Negative for polydipsia, polyphagia and polyuria.  Genitourinary: Negative for dysuria, hematuria and urgency.  Musculoskeletal: Negative for arthralgias, joint swelling and myalgias.  Skin: Negative for rash and wound.  Neurological: Negative for syncope, weakness and headaches.  Hematological: Negative for adenopathy.  Psychiatric/Behavioral:  The patient is nervous/anxious.   All other systems reviewed and are negative.    Physical Exam Updated Vital Signs BP 111/69   Pulse 96   Temp 98.3 F (36.8 C) (Oral)   Resp 16   Ht 5\' 4"  (1.626 m)   Wt 60.1 kg (132 lb 6.4 oz)   LMP 08/15/2015 (Approximate)   SpO2 100%   BMI 22.73 kg/m   Physical Exam  Constitutional: She appears well-developed and well-nourished. No distress.  Awake, alert, nontoxic appearance  HENT:  Head: Normocephalic and atraumatic.  Mouth/Throat: Oropharynx is clear and moist. No oropharyngeal exudate.  Eyes: Conjunctivae are normal. No scleral icterus.  Neck: Normal range of motion. Neck supple.  Cardiovascular: Regular rhythm and intact distal pulses.  Tachycardia present.   Pulses:      Radial pulses are 2+ on the right side, and 2+ on the left side.       Dorsalis pedis pulses are 2+ on the right side, and 2+ on the left side.  Pulmonary/Chest: Effort normal and breath sounds normal. No respiratory distress. She has no wheezes.  Equal chest expansion  Abdominal: Soft. Bowel sounds are normal. She exhibits distension (significnt). She exhibits no fluid wave, no ascites and no mass. There is hepatomegaly. There is no tenderness. There is no rigidity, no rebound, no guarding and no CVA tenderness.  Musculoskeletal: Normal range of motion. She exhibits edema.  Mild edema of the hands and feet, no pitting edema  Neurological: She is alert.  Speech is clear and goal oriented Moves extremities without ataxia  Skin: Skin is warm and dry. She is not diaphoretic.  Psychiatric: She has a normal mood and affect.  Nursing note and vitals reviewed.    ED Treatments / Results  Labs (all labs ordered are listed, but only abnormal results are displayed) Labs Reviewed  SEDIMENTATION RATE - Abnormal; Notable for the following:       Result Value   Sed Rate 24 (*)    All other components within normal limits  CBC WITH DIFFERENTIAL/PLATELET  COMPREHENSIVE  METABOLIC PANEL  BRAIN NATRIURETIC PEPTIDE  TSH  T4  I-STAT BETA HCG BLOOD, ED ([facility], [facility], [facility] ONLY)      Radiology Dg Chest 2 View  Result Date: 06/16/2017 CLINICAL DATA:  41 year old female with leg swelling. EXAM: CHEST  2 VIEW COMPARISON:  Chest radiograph dated 02/07/2013 FINDINGS: The heart size and mediastinal contours are within normal limits. Both lungs are clear. The visualized skeletal structures are unremarkable. IMPRESSION: No active cardiopulmonary disease. Electronically Signed   By: Anner Crete M.D.   On: 06/16/2017 23:12   Ct Abdomen Pelvis W Contrast  Result Date: 06/17/2017 CLINICAL DATA:  Abdomen distension for 1 month EXAM: CT ABDOMEN AND PELVIS WITH CONTRAST TECHNIQUE: Multidetector CT imaging of the abdomen and pelvis was performed using the standard protocol following bolus administration of intravenous contrast. CONTRAST:  80 mL Isovue 300 intravenous COMPARISON:  None. FINDINGS: Lower chest: No acute  abnormality. Hepatobiliary: No focal liver abnormality is seen. No gallstones, gallbladder wall thickening, or biliary dilatation. Pancreas: Unremarkable. No pancreatic ductal dilatation or surrounding inflammatory changes. Spleen: Possible vague hypodense lesions within the spleen, versus heterogenous enhancement. Adrenals/Urinary Tract: Adrenal glands are unremarkable. Kidneys are normal, without renal calculi, focal lesion, or hydronephrosis. Bladder is unremarkable. Stomach/Bowel: Stomach is within normal limits. Appendix appears normal. No evidence of bowel wall thickening, distention, or inflammatory changes. Moderate stool burden in the colon. Vascular/Lymphatic: No significant vascular findings are present. No enlarged abdominal or pelvic lymph nodes. Reproductive: Status post hysterectomy. No adnexal masses. Other: Tiny nodule in the left upper quadrant, series 3, image number 24. No free air or free fluid. Fat in the umbilicus. Musculoskeletal: No acute or significant  osseous findings. IMPRESSION: 1. No definite CT evidence for acute intra-abdominal or pelvic pathology. Moderate stool burden in the colon. 2. Possible vague hypodense splenic lesions versus heterogenous enhancement. Suggest non emergent MRI correlation for follow-up. 3. Tiny 3 mm nodule in the left upper quadrant mesentery adjacent to the colon, possibly a low lying tiny splenule. Short interval CT follow-up could be considered to document stability. Electronically Signed   By: Donavan Foil M.D.   On: 06/17/2017 00:51     Date: 06/17/2017  Rate: 99  Rhythm: normal sinus rhythm  QRS Axis: normal  Intervals: normal  ST/T Wave abnormalities: normal  Conduction Disutrbances: none  Narrative Interpretation:   Old EKG Reviewed: No significant changes noted    Procedures Procedures (including critical care time)  Medications Ordered in ED Medications  iopamidol (ISOVUE-300) 61 % injection (80 mLs  Contrast Given 06/16/17 2357)     Initial Impression / Assessment and Plan / ED Course  I have reviewed the triage vital signs and the nursing notes.  Pertinent labs & imaging results that were available during my care of the patient were reviewed by me and considered in my medical decision making (see chart for details).     Pt with significant distension of the abd, however it is soft and nontender.  No fluid wave.  Pt with tachycardia, but no CP or SOB.  No calf tenderness.  Labs reassuring.  CXR without evidence of pulmonary edema or increased pulmonary vasculature.  CT abd pending.    1:39 AM CT scan without acute abnormalities. 3 mm nodule in the left upper quadrant mesentery noted. Discussed with patient. She will need close primary care follow-up for this.  Sedimentation rate is barely elevated and nonspecific. Patient's tachycardia is likely secondary to anxiety. She is low risk for DVT/PE. No chest pain or shortness of breath.  Discussed reasons to return immediately to the emergency  department and importance of a low sodium diet.  Final Clinical Impressions(s) / ED Diagnoses   Final diagnoses:  Abdominal distension    New Prescriptions Current Discharge Medication List       Agapito Games 06/17/17 [id number]    Pattricia Boss, MD 06/17/17 1623

## 2017-06-17 LAB — TSH: TSH: 2.396 u[IU]/mL (ref 0.350–4.500)

## 2017-06-17 NOTE — ED Notes (Signed)
E-signature not available, verbalized understanding of DC instructions. 

## 2017-06-17 NOTE — Discharge Instructions (Signed)
1. Medications: usual home medications 2. Treatment: rest, drink plenty of fluids, low salt diet 3. Follow Up: Please followup with your primary doctor in 2-3 days for discussion of your diagnoses and further evaluation after today's visit; if you do not have a primary care doctor use the resource guide provided to find one; Please return to the ER for worsening symptoms, development of pain, fever, vomiting or other concerns

## 2017-06-30 ENCOUNTER — Encounter (HOSPITAL_COMMUNITY): Payer: Self-pay | Admitting: Emergency Medicine

## 2017-06-30 ENCOUNTER — Emergency Department (HOSPITAL_COMMUNITY)
Admission: EM | Admit: 2017-06-30 | Discharge: 2017-06-30 | Disposition: A | Discharge status: Home / Self Care | Payer: Medicaid Other | Attending: Emergency Medicine | Admitting: Emergency Medicine

## 2017-06-30 DIAGNOSIS — Z87891 Personal history of nicotine dependence: Secondary | ICD-10-CM | POA: Diagnosis not present

## 2017-06-30 DIAGNOSIS — D739 Disease of spleen, unspecified: Secondary | ICD-10-CM | POA: Diagnosis not present

## 2017-06-30 DIAGNOSIS — G894 Chronic pain syndrome: Secondary | ICD-10-CM | POA: Diagnosis not present

## 2017-06-30 DIAGNOSIS — R140 Abdominal distension (gaseous): Secondary | ICD-10-CM

## 2017-06-30 DIAGNOSIS — R109 Unspecified abdominal pain: Secondary | ICD-10-CM | POA: Diagnosis present

## 2017-06-30 DIAGNOSIS — D7389 Other diseases of spleen: Secondary | ICD-10-CM

## 2017-06-30 LAB — COMPREHENSIVE METABOLIC PANEL
ALT: 56 U/L — ABNORMAL HIGH (ref 14–54)
AST: 59 U/L — ABNORMAL HIGH (ref 15–41)
Albumin: 4.1 g/dL (ref 3.5–5.0)
Alkaline Phosphatase: 45 U/L (ref 38–126)
Anion gap: 9 (ref 5–15)
BUN: 9 mg/dL (ref 6–20)
CO2: 24 mmol/L (ref 22–32)
Calcium: 9.5 mg/dL (ref 8.9–10.3)
Chloride: 103 mmol/L (ref 101–111)
Creatinine, Ser: 0.71 mg/dL (ref 0.44–1.00)
GFR calc Af Amer: >60 mL/min (ref >60)
GFR calc non Af Amer: >60 mL/min (ref >60)
Glucose, Bld: 108 mg/dL — ABNORMAL HIGH (ref 65–99)
Potassium: 4.3 mmol/L (ref 3.5–5.1)
Sodium: 136 mmol/L (ref 135–145)
Total Bilirubin: 0.4 mg/dL (ref 0.3–1.2)
Total Protein: 7.6 g/dL (ref 6.5–8.1)

## 2017-06-30 LAB — URINALYSIS, ROUTINE W REFLEX MICROSCOPIC
Bilirubin Urine: NEGATIVE
Glucose, UA: NEGATIVE mg/dL
Hgb urine dipstick: NEGATIVE
Ketones, ur: NEGATIVE mg/dL
Leukocytes, UA: NEGATIVE
Nitrite: NEGATIVE
Protein, ur: NEGATIVE mg/dL
Specific Gravity, Urine: 1.015 (ref 1.005–1.030)
pH: 5 (ref 5.0–8.0)

## 2017-06-30 LAB — CBC
HCT: 41.2 % (ref 36.0–46.0)
Hemoglobin: 13.5 g/dL (ref 12.0–15.0)
MCH: 31 pg (ref 26.0–34.0)
MCHC: 32.8 g/dL (ref 30.0–36.0)
MCV: 94.5 fL (ref 78.0–100.0)
Platelets: 240 10*3/uL (ref 150–400)
RBC: 4.36 MIL/uL (ref 3.87–5.11)
RDW: 12 % (ref 11.5–15.5)
WBC: 5.3 10*3/uL (ref 4.0–10.5)

## 2017-06-30 LAB — I-STAT BETA HCG BLOOD, ED (MC, WL, AP ONLY): I-stat hCG, quantitative: <5 m[IU]/mL (ref <5)

## 2017-06-30 LAB — LIPASE, BLOOD: Lipase: 31 U/L (ref 11–51)

## 2017-06-30 NOTE — Discharge Instructions (Signed)
You were seen in the ED today with abdominal distension. We found clear cause of your symptoms and would like you to follow up with your PCP as soon as you are able. Your CT scan from the last ED visit showed some small lesions on the spleen that may need outpatient MRI for further evaluation. Discuss with along with your abdominal swelling with your PCP.

## 2017-06-30 NOTE — ED Triage Notes (Signed)
Pt to ER for evaluation of abdominal distention and fluid build up going on for approx. 3 months. States has no medical hx. VSS. A/o x4.

## 2017-06-30 NOTE — ED Provider Notes (Signed)
Emergency Department Provider Note   I have reviewed the triage vital signs and the nursing notes.   HISTORY  Chief Complaint Abdominal Pain   HPI Brianna Velez is a 41 y.o. female with PMH of uterine fibroids, migraine HA, and sciatica presents to the emergency pertinent for evaluation of continued abdominal distention. She was seen last week in emergency department for similar symptoms at which point she had a negative CT scan of the abdomen/pelvis. No fevers or chills. No vaginal bleeding or discharge. No nausea, vomiting, or diarrhea. She has not been able to f/u with her PCP since the last ED visit. No CP or SOB. No medication changes.   Past Medical History:  Diagnosis Date  . Fibroids   . History of hiatal hernia   . Leg pain   . Migraine   . Sciatica     Patient Active Problem List   Diagnosis Date Noted  . Chronic pain syndrome 03/31/2016  . Screening for diabetes mellitus 12/31/2015  . S/P TAH (total abdominal hysterectomy) 09/29/2015  . Fibroid, uterine   . Menorrhagia with regular cycle   . Fibroid uterus 07/01/2015  . Left hip pain 11/06/2014    Past Surgical History:  Procedure Laterality Date  . ABDOMINAL HYSTERECTOMY  09/29/2015   Procedure: HYSTERECTOMY ABDOMINAL;  Surgeon: Mora Bellman, MD;  Location: [facility];  Service: Gynecology;;  . BILATERAL SALPINGECTOMY Bilateral 09/29/2015   Procedure: BILATERAL SALPINGECTOMY;  Surgeon: Mora Bellman, MD;  Location: [facility];  Service: Gynecology;  Laterality: Bilateral;  . TUBAL LIGATION    . WISDOM TOOTH EXTRACTION      Current Outpatient Rx  . Order #: [id number] Class: Historical Med  . Order #: [id number] Class: Historical Med  . Order #: [id number] Class: Historical Med    Allergies Prednisone  Family History  Problem Relation Age of Onset  . Diabetes Sister   . Heart disease Paternal Grandmother   . Hypertension Father   . Dementia Mother   . Hypertension Mother     Social History Social  History  Substance Use Topics  . Smoking status: Former Smoker    Quit date: 12/05/1996  . Smokeless tobacco: Never Used  . Alcohol use No    Review of Systems  Constitutional: No fever/chills Eyes: No visual changes. ENT: No sore throat. Cardiovascular: Denies chest pain. Respiratory: Denies shortness of breath. Gastrointestinal: No abdominal pain.  No nausea, no vomiting.  No diarrhea.  No constipation. Positive abdominal distension.  Genitourinary: Negative for dysuria. Musculoskeletal: Negative for back pain. Skin: Negative for rash. Neurological: Negative for headaches, focal weakness or numbness.  10-point ROS otherwise negative.  ____________________________________________   PHYSICAL EXAM:  VITAL SIGNS: ED Triage Vitals  Enc Vitals Group     BP 06/30/17 1737 119/81     Pulse Rate 06/30/17 1737 (!) 121     Resp 06/30/17 1737 18     Temp 06/30/17 1737 98 F (36.7 C)     Temp Source 06/30/17 1737 Oral     SpO2 06/30/17 1737 100 %     Weight 06/30/17 1737 140 lb (63.5 kg)     Height 06/30/17 1737 5\' 4"  (1.626 m)     Pain Score 06/30/17 1744 10   Constitutional: Alert and oriented. Well appearing and in no acute distress. Eyes: Conjunctivae are normal.  Head: Atraumatic. Nose: No congestion/rhinnorhea. Mouth/Throat: Mucous membranes are moist.  Oropharynx non-erythematous. Neck: No stridor.   Cardiovascular: Normal rate, regular rhythm. Good peripheral circulation. Grossly normal heart sounds.  Respiratory: Normal respiratory effort.  No retractions. Lungs CTAB. Gastrointestinal: Soft and nontender. Positive abdominal distension.  Musculoskeletal: No lower extremity tenderness nor edema. No gross deformities of extremities. Neurologic:  Normal speech and language. No gross focal neurologic deficits are appreciated.  Skin:  Skin is warm, dry and intact. No rash noted.  ____________________________________________   LABS (all labs ordered are listed, but only  abnormal results are displayed)  Labs Reviewed  COMPREHENSIVE METABOLIC PANEL - Abnormal; Notable for the following:       Result Value   Glucose, Bld 108 (*)    AST 59 (*)    ALT 56 (*)    All other components within normal limits  URINALYSIS, ROUTINE W REFLEX MICROSCOPIC - Abnormal; Notable for the following:    APPearance HAZY (*)    All other components within normal limits  LIPASE, BLOOD  CBC  I-STAT BETA HCG BLOOD, ED ([facility], [facility], [facility] ONLY)   ____________________________________________  RADIOLOGY  None ____________________________________________   PROCEDURES  Procedure(s) performed:   Procedures  Abdominal US: Limited Ultrasound of the abdomen and pericardium (FAST Exam).  Multiple views of the abdomen and pericardium are obtained with a multi-frequency probe.  EMERGENCY DEPARTMENT [place] FAST EXAM  INDICATIONS:abdominal distension   PERFORMED BY: Myself  FINDINGS: All views negative  LIMITATIONS:  None  INTERPRETATION:  No abdominal free fluid  CPT Codes: cardiac 93308-26, abdomen [phone] (study includes both codes) ____________________________________________   INITIAL IMPRESSION / ASSESSMENT AND PLAN / ED COURSE  Pertinent labs & imaging results that were available during my care of the patient were reviewed by me and considered in my medical decision making (see chart for details).  Patient resents emergency department for evaluation of abdominal distention. No pain. Was seen in the emergency department last week with negative CT. She does have incidental findings of lesions on the spleen. She was recommended to have MRI of the abdomen as outpatient. Discussed this with her in detail. Liver enzymes slightly elevated today but no evidence of obstructive process. No abdominal ascites on bedside US. Plan for PCP follow up.   At this time, I do not feel there is any life-threatening condition present. I have reviewed and discussed all results (EKG, imaging,  lab, urine as appropriate), exam findings with patient. I have reviewed nursing notes and appropriate previous records.  I feel the patient is safe to be discharged home without further emergent workup. Discussed usual and customary return precautions. Patient and family (if present) verbalize understanding and are comfortable with this plan.  Patient will follow-up with their primary care provider. If they do not have a primary care provider, information for follow-up has been provided to them. All questions have been answered.    ____________________________________________  FINAL CLINICAL IMPRESSION(S) / ED DIAGNOSES  Final diagnoses:  Abdominal distension  Splenic lesion     MEDICATIONS GIVEN DURING THIS VISIT:  Medications - No data to display   NEW OUTPATIENT MEDICATIONS STARTED DURING THIS VISIT:  None   Note:  This document was prepared using Dragon voice recognition software and may include unintentional dictation errors.  Nanda Quinton, MD Emergency Medicine    Long, [facility], MD 06/30/17 [phone]

## 2017-07-23 ENCOUNTER — Emergency Department (HOSPITAL_COMMUNITY): Payer: Medicaid Other

## 2017-07-23 ENCOUNTER — Encounter (HOSPITAL_COMMUNITY): Payer: Self-pay | Admitting: Emergency Medicine

## 2017-07-23 ENCOUNTER — Emergency Department (HOSPITAL_COMMUNITY)
Admission: EM | Admit: 2017-07-23 | Discharge: 2017-07-23 | Disposition: A | Discharge status: Home / Self Care | Payer: Medicaid Other | Attending: Emergency Medicine | Admitting: Emergency Medicine

## 2017-07-23 DIAGNOSIS — G894 Chronic pain syndrome: Secondary | ICD-10-CM | POA: Diagnosis not present

## 2017-07-23 DIAGNOSIS — R140 Abdominal distension (gaseous): Secondary | ICD-10-CM

## 2017-07-23 DIAGNOSIS — Z87891 Personal history of nicotine dependence: Secondary | ICD-10-CM | POA: Diagnosis not present

## 2017-07-23 DIAGNOSIS — Z79899 Other long term (current) drug therapy: Secondary | ICD-10-CM | POA: Insufficient documentation

## 2017-07-23 LAB — CBC WITH DIFFERENTIAL/PLATELET
Basophils Absolute: 0 10*3/uL (ref 0.0–0.1)
Basophils Relative: 0 %
Eosinophils Absolute: 0.1 10*3/uL (ref 0.0–0.7)
Eosinophils Relative: 4 %
HCT: 37.8 % (ref 36.0–46.0)
Hemoglobin: 12.6 g/dL (ref 12.0–15.0)
Lymphocytes Relative: 36 %
Lymphs Abs: 1.1 10*3/uL (ref 0.7–4.0)
MCH: 31.7 pg (ref 26.0–34.0)
MCHC: 33.3 g/dL (ref 30.0–36.0)
MCV: 95 fL (ref 78.0–100.0)
Monocytes Absolute: 0.3 10*3/uL (ref 0.1–1.0)
Monocytes Relative: 10 %
Neutro Abs: 1.5 10*3/uL — ABNORMAL LOW (ref 1.7–7.7)
Neutrophils Relative %: 50 %
Platelets: 249 10*3/uL (ref 150–400)
RBC: 3.98 MIL/uL (ref 3.87–5.11)
RDW: 11.7 % (ref 11.5–15.5)
WBC: 3.1 10*3/uL — ABNORMAL LOW (ref 4.0–10.5)

## 2017-07-23 LAB — COMPREHENSIVE METABOLIC PANEL
ALT: 20 U/L (ref 14–54)
AST: 26 U/L (ref 15–41)
Albumin: 3.7 g/dL (ref 3.5–5.0)
Alkaline Phosphatase: 53 U/L (ref 38–126)
Anion gap: 5 (ref 5–15)
BUN: 7 mg/dL (ref 6–20)
CO2: 31 mmol/L (ref 22–32)
Calcium: 9.5 mg/dL (ref 8.9–10.3)
Chloride: 103 mmol/L (ref 101–111)
Creatinine, Ser: 0.63 mg/dL (ref 0.44–1.00)
GFR calc Af Amer: >60 mL/min (ref >60)
GFR calc non Af Amer: >60 mL/min (ref >60)
Glucose, Bld: 69 mg/dL (ref 65–99)
Potassium: 4.1 mmol/L (ref 3.5–5.1)
Sodium: 139 mmol/L (ref 135–145)
Total Bilirubin: 0.7 mg/dL (ref 0.3–1.2)
Total Protein: 7.5 g/dL (ref 6.5–8.1)

## 2017-07-23 LAB — I-STAT TROPONIN, ED: Troponin i, poc: 0 ng/mL (ref 0.00–0.08)

## 2017-07-23 LAB — CK: Total CK: 132 U/L (ref 38–234)

## 2017-07-23 MED ORDER — FUROSEMIDE 20 MG PO TABS: 20.0000 mg | ORAL_TABLET | Freq: Every day | ORAL | 0 refills | Status: DC

## 2017-07-23 NOTE — ED Provider Notes (Signed)
[facility] DEPT Provider Note   CSN: [id number] Arrival date & time: 07/23/17  1126     History   Chief Complaint Chief Complaint  Patient presents with  . Shortness of Breath  . Leg Pain    HPI Brianna Velez is a 41 y.o. female.  HPI 41 year old African-American female past medical history significant for migraines, sciatica, subtotal hysterectomy that presents to the emergency department today with ongoing abdominal distention and "body swelling". Patient states that her symptoms have been ongoing for the past 3 months. Patient has been evaluated in the ED 2 times in the past 1 month.Pt reports eating makes her abd distension worse and not eating makes it slightly later. She reports 30lb weight gain in the last 4 weeks patient reports associated foot and leg swelling. States is intermittent and comes and goes.patient denies any significant alcohol use. Patient states that the swelling is intermittent. It is generalized abdominal pain. Patient denies any EtOH use, smoking or drug use. Patient has not followed up with her primary care doctor specialist. Pt had a ct scan on 7/13 of abdomen with 3 mm nodule in the left upper quadrant mesentery noted that needs further workup by gi. Patient not having further symptoms.  Patient denies any associated complaints of fever, chills, headache, vision changes, lightheadedness, dizziness, chest pain, shortness of breath, nausea, emesis, urinary symptoms, vaginal symptoms, change in bowel habits, paresthesias. Past Medical History:  Diagnosis Date  . Fibroids   . History of hiatal hernia   . Leg pain   . Migraine   . Sciatica     Patient Active Problem List   Diagnosis Date Noted  . Chronic pain syndrome 03/31/2016  . Screening for diabetes mellitus 12/31/2015  . S/P TAH (total abdominal hysterectomy) 09/29/2015  . Fibroid, uterine   . Menorrhagia with regular cycle   . Fibroid uterus 07/01/2015  . Left hip pain 11/06/2014    Past  Surgical History:  Procedure Laterality Date  . ABDOMINAL HYSTERECTOMY  09/29/2015   Procedure: HYSTERECTOMY ABDOMINAL;  Surgeon: Mora Bellman, MD;  Location: [facility];  Service: Gynecology;;  . BILATERAL SALPINGECTOMY Bilateral 09/29/2015   Procedure: BILATERAL SALPINGECTOMY;  Surgeon: Mora Bellman, MD;  Location: [facility];  Service: Gynecology;  Laterality: Bilateral;  . TUBAL LIGATION    . WISDOM TOOTH EXTRACTION      OB History    Gravida Para Term Preterm AB Living   2 2 2  0 0 2   SAB TAB Ectopic Multiple Live Births   0 0 0 0         Home Medications    Prior to Admission medications   Medication Sig Start Date End Date Taking? Authorizing Provider  cyclobenzaprine (FLEXERIL) 10 MG tablet Take 10 mg by mouth 3 (three) times daily.    [provider]  gabapentin (NEURONTIN) 300 MG capsule Take 300 mg by mouth 3 (three) times daily. 05/26/17   [provider]  naproxen (NAPROSYN) 500 MG tablet Take 500 mg by mouth 2 (two) times daily. 04/20/17   [provider]    Family History Family History  Problem Relation Age of Onset  . Diabetes Sister   . Heart disease Paternal Grandmother   . Hypertension Father   . Dementia Mother   . Hypertension Mother     Social History Social History  Substance Use Topics  . Smoking status: Former Smoker    Quit date: 12/05/1996  . Smokeless tobacco: Never Used  . Alcohol use  No     Allergies   Prednisone   Review of Systems Review of Systems  Constitutional: Negative for chills and fever.  HENT: Negative for congestion.   Eyes: Negative for visual disturbance.  Respiratory: Negative for cough and shortness of breath.   Cardiovascular: Positive for leg swelling (bilatearlly). Negative for chest pain.  Gastrointestinal: Positive for abdominal distention and abdominal pain. Negative for diarrhea, nausea and vomiting.  Genitourinary: Negative for dysuria, flank pain, frequency, hematuria, urgency,  vaginal bleeding and vaginal discharge.  Musculoskeletal: Negative for arthralgias and myalgias.  Skin: Negative for rash.  Neurological: Negative for dizziness, syncope, weakness, light-headedness, numbness and headaches.  Psychiatric/Behavioral: Negative for sleep disturbance. The patient is not nervous/anxious.      Physical Exam Updated Vital Signs BP 111/76   Pulse 84   Temp 98.7 F (37.1 C) (Oral)   Resp 16   Ht 5\' 4"  (1.626 m)   Wt 62.9 kg (138 lb 9.6 oz)   LMP 08/15/2015 (Approximate)   SpO2 100%   BMI 23.79 kg/m   Physical Exam  Constitutional: She is oriented to person, place, and time. She appears well-developed and well-nourished.  Non-toxic appearance. No distress.  HENT:  Head: Normocephalic and atraumatic.  Nose: Nose normal.  Mouth/Throat: Oropharynx is clear and moist.  Eyes: Pupils are equal, round, and reactive to light. Conjunctivae are normal. Right eye exhibits no discharge. Left eye exhibits no discharge.  Neck: Normal range of motion. Neck supple.  Cardiovascular: Normal rate, regular rhythm, normal heart sounds and intact distal pulses.  Exam reveals no gallop and no friction rub.   No murmur heard. Pulmonary/Chest: Effort normal and breath sounds normal. No respiratory distress. She has no wheezes. She has no rales. She exhibits no tenderness.  No hypoxia or tachypnea noted.  Abdominal: Soft. Bowel sounds are normal. She exhibits distension. There is no tenderness. There is no rebound and no guarding.  Abdominal distention is noted. No focal tenderness of the abdomen to palpation.  Musculoskeletal: Normal range of motion. She exhibits no tenderness.  Do not appreciate any lower extremity edema or calf tenderness.  Lymphadenopathy:    She has no cervical adenopathy.  Neurological: She is alert and oriented to person, place, and time.  Skin: Skin is warm and dry. Capillary refill takes less than 2 seconds.  Psychiatric: Her behavior is normal.  Judgment and thought content normal.  Nursing note and vitals reviewed.    ED Treatments / Results  Labs (all labs ordered are listed, but only abnormal results are displayed) Labs Reviewed  CBC WITH DIFFERENTIAL/PLATELET - Abnormal; Notable for the following:       Result Value   WBC 3.1 (*)    Neutro Abs 1.5 (*)    All other components within normal limits  CK  COMPREHENSIVE METABOLIC PANEL  CA 024  I-STAT TROPONIN, ED    EKG  EKG Interpretation None       Radiology Dg Chest 2 View  Result Date: 07/23/2017 CLINICAL DATA:  Pt is having abdominal swelling, generalized pain and SOB x 4 months. She says she has tests and something is going on with her liver and her spleen. She rates her pain 10/10. No heart or lung conditions that she is aware of. Denies N/V/D EXAM: CHEST  2 VIEW COMPARISON:  06/16/2017 FINDINGS: The heart size and mediastinal contours are within normal limits. Both lungs are clear. No pleural effusion or pneumothorax. The visualized skeletal structures are unremarkable. IMPRESSION: Normal chest radiographs.  Electronically Signed   By: Lajean Manes M.D.   On: 07/23/2017 12:58    Procedures Procedures (including critical care time)  Medications Ordered in ED Medications - No data to display   Initial Impression / Assessment and Plan / ED Course  I have reviewed the triage vital signs and the nursing notes.  Pertinent labs & imaging results that were available during my care of the patient were reviewed by me and considered in my medical decision making (see chart for details).      Patient presents to the ED for ongoing abdominal distention and lower extremity edema. Patient has had extensive workup over the past month for same with several ED visits for same. The patient has not been to follow up with primary care doctor due to insurance. She also reports generalized abdominal pain with the swelling. States the swelling is intermittent. She denies any  associated symptoms of fever, chills, urinary symptoms, vaginal symptoms, change in bowel habits, chest pain, shortness of breath.   Vital signs are reassuring. Patient is afebrile with no tachycardia or hypotension.   Abdominal exam reveals some mild distention. No focal abdominal tenderness concerning for peritonitis. No lower extremity edema noted. Lungs are clear to auscultation bilaterally. Patient is not hypoxic with no tachypnea.  Patient had CT scan 2 weeks ago of abdomen that revealed a 3 mm nodule in the left upper quadrant mesentery. At that time they did a sedimentation rate that was barely elevated nonspecific. Patient was low risk for DVT/PE. Patient states that her abdominal distention  Has persisted.    Lab work was repeated in the ED. Liver enzymes are normal. Albumin is normal. A troponin and CK was performed in the ED prior to my assessment. Unsure of reason for this test. Likely placed in triage. Patient denied any chest pain or shortness of breath.  These are both negative.No leukocytosis.   Chest x-ray is unremarkable. Even patient is distention and retained ovaries concerning for possible ovarian cancer. Ultrasound was normal. CA-125 antigen pending. Unknown cause the patient's abdominal distention. Given normal liver enzymes, bilirubin, albumin, lipase doubt liver etiology. We'll place patient on a small dose of Lasix. I have counseled the case manager to get close follow-up in the outpatient setting.  Pt is hemodynamically stable, in NAD, & able to ambulate in the ED. Evaluation does not show pathology that would require ongoing emergent intervention or inpatient treatment. I explained the diagnosis to the patient. Pain has been managed & has no complaints prior to dc. Pt is comfortable with above plan and is stable for discharge at this time. All questions were answered prior to disposition. Strict return precautions for f/u to the ED were discussed. Encouraged follow up with  PCP.  Pt seen and eval by Dr. Jeneen Rinks who is agreeable to the above plan.  Final Clinical Impressions(s) / ED Diagnoses   Final diagnoses:  Abdominal distention    New Prescriptions Discharge Medication List as of 07/23/2017  4:36 PM    START taking these medications   Details  furosemide (LASIX) 20 MG tablet Take 1 tablet (20 mg total) by mouth daily., Starting Sun 07/23/2017, Print         Doristine Devoid, PA-C 07/24/17 1518    Tanna Furry, MD 08/04/17 [phone]

## 2017-07-23 NOTE — ED Notes (Signed)
Attempted to stick pt for blood work.

## 2017-07-23 NOTE — ED Notes (Signed)
Patient transported to Ultrasound 

## 2017-07-23 NOTE — Discharge Instructions (Signed)
Here lab work and imaging has been reassuring. Please start taking the Lasix as prescribed daily for 14 days. Need to follow up with primary care doctor. We'll have the care manager call you. Return to the ED if he develops any worsening symptoms.  To find a primary care or specialty doctor please call [phone] or [phone] to access "[facility] Service."  You may also go on the [facility] website at [email or web address]  There are also multiple [facility],  and [facility] practices throughout the [place] that are frequently accepting new patients. You may find a clinic that is close to your home and contact them.  [facility] - [place] [phone]  [facility] in [place] (also locations in [place] and [place]) - [place] [phone]  [place] [phone]

## 2017-07-23 NOTE — ED Triage Notes (Signed)
The pateint has been seen here [facility] for the same issues.  She is having abdominal swelling, generalized pain.  She says she has tests and something is going on with her liver and her spleen.   She rates her pain 10/10.

## 2017-07-24 ENCOUNTER — Telehealth: Payer: Self-pay | Admitting: *Deleted

## 2017-07-24 NOTE — Telephone Encounter (Signed)
Brianna Jetter [name], RN, BSN, [name]  [facility] set up appointment with [name] on 8/28 /2 0930.  Left VM and advised to please arrive 15 min early and take a picture ID and your current medications.

## 2017-07-25 LAB — CA 125: Cancer Antigen (CA) 125: 10.1 U/mL (ref 0.0–38.1)

## 2017-08-01 ENCOUNTER — Ambulatory Visit (INDEPENDENT_AMBULATORY_CARE_PROVIDER_SITE_OTHER): Payer: Medicaid Other | Admitting: Family Medicine

## 2017-08-01 ENCOUNTER — Encounter: Payer: Self-pay | Admitting: Family Medicine

## 2017-08-01 ENCOUNTER — Encounter: Payer: Self-pay | Admitting: Internal Medicine

## 2017-08-01 VITALS — BP 116/84 | HR 86 | Temp 97.8°F | Resp 16 | Ht 64.0 in | Wt 142.0 lb

## 2017-08-01 DIAGNOSIS — Z23 Encounter for immunization: Secondary | ICD-10-CM | POA: Diagnosis not present

## 2017-08-01 DIAGNOSIS — D7389 Other diseases of spleen: Secondary | ICD-10-CM

## 2017-08-01 DIAGNOSIS — R140 Abdominal distension (gaseous): Secondary | ICD-10-CM

## 2017-08-01 DIAGNOSIS — R935 Abnormal findings on diagnostic imaging of other abdominal regions, including retroperitoneum: Secondary | ICD-10-CM | POA: Diagnosis not present

## 2017-08-01 DIAGNOSIS — Z1231 Encounter for screening mammogram for malignant neoplasm of breast: Secondary | ICD-10-CM

## 2017-08-01 DIAGNOSIS — D739 Disease of spleen, unspecified: Secondary | ICD-10-CM

## 2017-08-01 DIAGNOSIS — Z1239 Encounter for other screening for malignant neoplasm of breast: Secondary | ICD-10-CM

## 2017-08-01 LAB — POCT URINALYSIS DIP (DEVICE)
Bilirubin Urine: NEGATIVE
Glucose, UA: NEGATIVE mg/dL
Hgb urine dipstick: NEGATIVE
Ketones, ur: NEGATIVE mg/dL
Nitrite: NEGATIVE
Protein, ur: NEGATIVE mg/dL
Specific Gravity, Urine: 1.025 (ref 1.005–1.030)
Urobilinogen, UA: 0.2 mg/dL (ref 0.0–1.0)
pH: 5.5 (ref 5.0–8.0)

## 2017-08-01 MED ORDER — FUROSEMIDE 20 MG PO TABS: 20.0000 mg | ORAL_TABLET | Freq: Every day | ORAL | 0 refills | Status: DC

## 2017-08-01 NOTE — Progress Notes (Signed)
Patient ID: Brianna Velez, female    DOB: [birth year], 41 y.o.   MRN: [id number]  PCP: Brianna Jun, FNP  Chief Complaint  Patient presents with  . Establish Care  . Hospitalization Follow-up    Subjective:  HPI Brianna Velez is a 41 y.o. female presents to establish care and hospital follow-up. Patient was lost to follow-up here over 1 year ago. She seen and evaluated at [facility] for abdominal distension. CT of the abdomen 06/16/2017 was significant for 3 mm nodule in the left upper quadrant mesentery for which she will need a GI referral today. CT of abdomen was also significant for splenic lesions which an MRI follow-up was recommended. She suffers from chronic abdominal pain for which she reports has been on-going for more than 3 months. Reports bilateral leg swelling and increased pressure in her lower legs. Reports regular bowel movement. Reports increased appetite and estimated weight gain 30 lbs. Recent labs indicated normal liver function and CA 125 was within range. She was prescribed furosemide although she stopped taking the medication as she was experiencing increased thirst and urination.  Social History   Social History  . Marital status: Married    Spouse name: N/A  . Number of children: N/A  . Years of education: N/A   Occupational History  . Not on file.   Social History Main Topics  . Smoking status: Former Smoker    Quit date: 12/05/1996  . Smokeless tobacco: Never Used  . Alcohol use No  . Drug use: No  . Sexual activity: Yes    Birth control/ protection: Surgical   Other Topics Concern  . Not on file   Social History Narrative  . No narrative on file    Family History  Problem Relation Age of Onset  . Diabetes Sister   . Heart disease Paternal Grandmother   . Hypertension Father   . Dementia Mother   . Hypertension Mother    Review of Systems See HPI  Patient Active Problem List   Diagnosis Date Noted  . Chronic pain syndrome  03/31/2016  . Screening for diabetes mellitus 12/31/2015  . S/P TAH (total abdominal hysterectomy) 09/29/2015  . Fibroid, uterine   . Menorrhagia with regular cycle   . Fibroid uterus 07/01/2015  . Left hip pain 11/06/2014      Prior to Admission medications   Medication Sig Start Date End Date Taking? Authorizing Provider  cyclobenzaprine (FLEXERIL) 10 MG tablet Take 10 mg by mouth 3 (three) times daily.   Yes [provider]  furosemide (LASIX) 20 MG tablet Take 1 tablet (20 mg total) by mouth daily. 07/23/17  Yes Ocie Cornfield T, PA-C  gabapentin (NEURONTIN) 300 MG capsule Take 300 mg by mouth 3 (three) times daily. 05/26/17  Yes [provider]  naproxen (NAPROSYN) 500 MG tablet Take 500 mg by mouth 2 (two) times daily. 04/20/17  Yes [provider]  Prenatal Vit-Fe Fumarate-FA (PRENATAL MULTIVITAMIN) TABS tablet Take 1 tablet by mouth daily at 12 noon.   Yes [provider]    Past Medical, Surgical Family and Social History reviewed and updated.    Objective:   Today's Vitals   08/01/17 0912  BP: 116/84  Pulse: 86  Resp: 16  Temp: 97.8 F (36.6 C)  TempSrc: Oral  SpO2: 100%  Weight: 142 lb (64.4 kg)  Height: 5\' 4"  (1.626 m)    Wt Readings from Last 3 Encounters:  08/01/17 142 lb (64.4 kg)  07/23/17 138 lb 9.6 oz (62.9 kg)  06/30/17 140 lb (63.5 kg)   Physical Exam  Constitutional: She is oriented to person, place, and time. She appears well-developed and well-nourished.  HENT:  Head: Normocephalic and atraumatic.  Eyes: Pupils are equal, round, and reactive to light. Conjunctivae are normal.  Neck: Normal range of motion. Neck supple.  Cardiovascular: Normal rate, regular rhythm, normal heart sounds and intact distal pulses.   Pulmonary/Chest: Effort normal and breath sounds normal.  Abdominal: Soft. Bowel sounds are normal. She exhibits distension. She exhibits no mass. There is no tenderness. There is no rebound and no  guarding.  Musculoskeletal: Normal range of motion. She exhibits edema.  Mild trace lower extremity edema, bilateral  Neurological: She is alert and oriented to person, place, and time.  Skin: Skin is warm and dry.  Psychiatric: She has a normal mood and affect. Her behavior is normal. Judgment and thought content normal.   Assessment & Plan:  1. Abdominal distension, patient has sustained and estimated 40 lb weight gain over the last year. Last PCP visit 03/2016, documented weight was 98 lbs, current weight 142. She has obtained a recent CT of abdomen which was significant for LUQ nodule and questionable splenic lesion. She is being referred to gastroenterology for further evaluation of these concerns. She is also encouraged to resume furosemide previously prescribed. -Gastroenterology appointment scheduled 09/25/2017  2. Screening for breast cancer - MM Digital Screening  3. Need for influenza vaccination - Flu Vaccine QUAD 36+ mos IM  4. Splenic lesion --MRI Abdomen pending   5. Abnormal CT of the abdomen --Gastroenterology appointment scheduled 09/25/2017 -MRI Abdomen pending    RTC: 1 month to evaluate abdominal distension and recheck BMP due to diuretic therapy  Carroll Sage. Kenton Kingfisher, MSN, FNP-C [facility]  [address] [place], [place] [address] [phone]

## 2017-08-01 NOTE — Patient Instructions (Signed)
I am referring you to gastroenterology for further evaluation of abdominal symptoms.  I will also schedule an MRI for further evaluation of splenic lesions noted from your CT abdominal  Scan.  I recommend that you resume furosemide (Lasx) 20 mg once daily for abdominal distension.

## 2017-08-06 ENCOUNTER — Telehealth: Payer: Self-pay | Admitting: Family Medicine

## 2017-08-06 NOTE — Telephone Encounter (Signed)
Schedule MRI of abdomen limited due to evaluate splenic lesions.

## 2017-08-06 NOTE — Telephone Encounter (Signed)
Erroneous

## 2017-08-08 NOTE — Telephone Encounter (Signed)
Patient is scheduled for 08/14/2017 @11 :30am. I have tried to call patient, no answer. Left a message for patient to call back. Will try later. Thanks!

## 2017-08-08 NOTE — Telephone Encounter (Signed)
Called, no answer. Left a message advising patient to MRI scheduled for 08/14/2017 @11 :30am and asked if she can not keep this appointment to please call and re-scheduled. Thanks!

## 2017-08-14 ENCOUNTER — Ambulatory Visit (HOSPITAL_COMMUNITY): Payer: Self-pay

## 2017-08-14 ENCOUNTER — Ambulatory Visit (HOSPITAL_COMMUNITY): Admission: RE | Admit: 2017-08-14 | Payer: Self-pay | Source: Ambulatory Visit

## 2017-08-17 ENCOUNTER — Ambulatory Visit (HOSPITAL_COMMUNITY): Admission: RE | Admit: 2017-08-17 | Payer: Self-pay | Source: Ambulatory Visit

## 2017-09-05 ENCOUNTER — Encounter (HOSPITAL_BASED_OUTPATIENT_CLINIC_OR_DEPARTMENT_OTHER): Payer: Self-pay

## 2017-09-05 ENCOUNTER — Emergency Department (HOSPITAL_COMMUNITY): Admission: EM | Admit: 2017-09-05 | Discharge: 2017-09-05 | Discharge status: Against Medical Advice | Payer: Self-pay

## 2017-09-05 ENCOUNTER — Emergency Department (HOSPITAL_BASED_OUTPATIENT_CLINIC_OR_DEPARTMENT_OTHER)
Admission: EM | Admit: 2017-09-05 | Discharge: 2017-09-06 | Disposition: A | Discharge status: Home / Self Care | Payer: Medicaid Other | Attending: Emergency Medicine | Admitting: Emergency Medicine

## 2017-09-05 DIAGNOSIS — Z87891 Personal history of nicotine dependence: Secondary | ICD-10-CM | POA: Insufficient documentation

## 2017-09-05 DIAGNOSIS — Z79899 Other long term (current) drug therapy: Secondary | ICD-10-CM | POA: Insufficient documentation

## 2017-09-05 DIAGNOSIS — R252 Cramp and spasm: Secondary | ICD-10-CM | POA: Insufficient documentation

## 2017-09-05 NOTE — ED Notes (Signed)
No answer when called for vitals in the lobby

## 2017-09-05 NOTE — ED Triage Notes (Addendum)
C/o bilat LE "muscle cramps" x 3-4 days-NAD-slow steady gait-states she is being seen for bilat LE swelling and abd swelling "at the [facility]" but denies hx of sickle cell disease

## 2017-09-06 LAB — COMPREHENSIVE METABOLIC PANEL
ALT: 18 U/L (ref 14–54)
AST: 24 U/L (ref 15–41)
Albumin: 4.1 g/dL (ref 3.5–5.0)
Alkaline Phosphatase: 53 U/L (ref 38–126)
Anion gap: 7 (ref 5–15)
BUN: 12 mg/dL (ref 6–20)
CO2: 26 mmol/L (ref 22–32)
Calcium: 9.1 mg/dL (ref 8.9–10.3)
Chloride: 103 mmol/L (ref 101–111)
Creatinine, Ser: 0.63 mg/dL (ref 0.44–1.00)
GFR calc Af Amer: >60 mL/min (ref >60)
GFR calc non Af Amer: >60 mL/min (ref >60)
Glucose, Bld: 90 mg/dL (ref 65–99)
Potassium: 4.1 mmol/L (ref 3.5–5.1)
Sodium: 136 mmol/L (ref 135–145)
Total Bilirubin: 0.1 mg/dL — ABNORMAL LOW (ref 0.3–1.2)
Total Protein: 7.9 g/dL (ref 6.5–8.1)

## 2017-09-06 LAB — CBC WITH DIFFERENTIAL/PLATELET
Basophils Absolute: 0 10*3/uL (ref 0.0–0.1)
Basophils Relative: 0 %
Eosinophils Absolute: 0.1 10*3/uL (ref 0.0–0.7)
Eosinophils Relative: 2 %
HCT: 40.1 % (ref 36.0–46.0)
Hemoglobin: 13.2 g/dL (ref 12.0–15.0)
Lymphocytes Relative: 36 %
Lymphs Abs: 1.9 10*3/uL (ref 0.7–4.0)
MCH: 31.4 pg (ref 26.0–34.0)
MCHC: 32.9 g/dL (ref 30.0–36.0)
MCV: 95.5 fL (ref 78.0–100.0)
Monocytes Absolute: 0.5 10*3/uL (ref 0.1–1.0)
Monocytes Relative: 9 %
Neutro Abs: 2.8 10*3/uL (ref 1.7–7.7)
Neutrophils Relative %: 53 %
Platelets: 206 10*3/uL (ref 150–400)
RBC: 4.2 MIL/uL (ref 3.87–5.11)
RDW: 11.7 % (ref 11.5–15.5)
WBC: 5.2 10*3/uL (ref 4.0–10.5)

## 2017-09-06 LAB — CK: Total CK: 155 U/L (ref 38–234)

## 2017-09-06 MED ORDER — CYCLOBENZAPRINE HCL 10 MG PO TABS
10.0000 mg | ORAL_TABLET | Freq: Once | ORAL | Status: AC
  Administered 2017-09-06: 10 mg via ORAL
  Filled 2017-09-06: qty 1

## 2017-09-06 MED ORDER — CYCLOBENZAPRINE HCL 10 MG PO TABS: 10.0000 mg | ORAL_TABLET | Freq: Three times a day (TID) | ORAL | 0 refills | Status: DC | PRN

## 2017-09-06 MED ORDER — TRAMADOL HCL 50 MG PO TABS: 50.0000 mg | ORAL_TABLET | Freq: Four times a day (QID) | ORAL | 0 refills | Status: DC | PRN

## 2017-09-06 MED ORDER — TRAMADOL HCL 50 MG PO TABS
50.0000 mg | ORAL_TABLET | Freq: Once | ORAL | Status: AC
  Administered 2017-09-06: 50 mg via ORAL
  Filled 2017-09-06: qty 1

## 2017-09-06 NOTE — ED Provider Notes (Signed)
[facility] Provider Note: Georgena Spurling, MD, FACEP  CSN: [id number] MRN: [id number] ARRIVAL: 09/05/17 at [place]: [place]  Muscle Pain   HISTORY OF PRESENT ILLNESS  09/06/17 12:20 AM Brianna Velez is a 41 y.o. female with a history of chronic abdominal pain and distention. She is here with a 3 to four-day history of what she describes as muscle cramps in her legs and feet. She attributes this to standing all day at work. She works in a Paediatric nurse. She describes the pain as a 10 out of 10. She has not been taking any medications for the pain. There is no associated swelling.  Consultation with the [facility] state controlled substances database reveals the patient had been receiving regular monthly prescriptions for tramadol up until April of this year. She also received one prescription for hydrocodone in December of last year.   Past Medical History:  Diagnosis Date  . Fibroids   . History of hiatal hernia   . Leg pain   . Migraine   . Sciatica     Past Surgical History:  Procedure Laterality Date  . ABDOMINAL HYSTERECTOMY  09/29/2015   Procedure: HYSTERECTOMY ABDOMINAL;  Surgeon: Mora Bellman, MD;  Location: [facility];  Service: Gynecology;;  . BILATERAL SALPINGECTOMY Bilateral 09/29/2015   Procedure: BILATERAL SALPINGECTOMY;  Surgeon: Mora Bellman, MD;  Location: [facility];  Service: Gynecology;  Laterality: Bilateral;  . TUBAL LIGATION    . WISDOM TOOTH EXTRACTION      Family History  Problem Relation Age of Onset  . Diabetes Sister   . Heart disease Paternal Grandmother   . Hypertension Father   . Dementia Mother   . Hypertension Mother     Social History  Substance Use Topics  . Smoking status: Former Smoker    Quit date: 12/05/1996  . Smokeless tobacco: Never Used  . Alcohol use No    Prior to Admission medications   Medication Sig Start Date End Date Taking? Authorizing Provider  furosemide (LASIX) 20 MG tablet  Take 1 tablet (20 mg total) by mouth daily. 08/01/17   Scot Jun, FNP  gabapentin (NEURONTIN) 300 MG capsule Take 300 mg by mouth 3 (three) times daily. 05/26/17   [provider]  Prenatal Vit-Fe Fumarate-FA (PRENATAL MULTIVITAMIN) TABS tablet Take 1 tablet by mouth daily at 12 noon.    [provider]    Allergies Prednisone   REVIEW OF SYSTEMS  Negative except as noted here or in the History of Present Illness.   PHYSICAL EXAMINATION  Initial Vital Signs Blood pressure 115/63, pulse 93, temperature 98.4 F (36.9 C), temperature source Oral, resp. rate 18, height 5\' 4"  (1.626 m), weight 64.7 kg (142 lb 10.2 oz), last menstrual period 08/15/2015, SpO2 100 %.  Examination General: Well-developed, well-nourished female in no acute distress; appearance consistent with age of record HENT: normocephalic; atraumatic Eyes: pupils equal, round and reactive to light; extraocular muscles intact Neck: supple Heart: regular rate and rhythm Lungs: clear to auscultation bilaterally Abdomen: soft; distended; diffusely tender; bowel sounds hyperactive Extremities: No deformity; full range of motion; pulses normal; muscle tenderness; no edema Neurologic: Awake, alert and oriented; motor function intact in all extremities and symmetric; no facial droop Skin: Warm and dry Psychiatric: Flat affect   RESULTS  Summary of this visit's results, reviewed by myself:   EKG Interpretation  Date/Time:    Ventricular Rate:    PR Interval:    QRS Duration:  QT Interval:    QTC Calculation:   R Axis:     Text Interpretation:        Laboratory Studies: Results for orders placed or performed during the hospital encounter of 09/05/17 (from the past 24 hour(s))  CK     Status: None   Collection Time: 09/06/17 12:35 AM  Result Value Ref Range   Total CK 155 38 - 234 U/L  CBC with Differential/Platelet     Status: None   Collection Time: 09/06/17 12:35 AM  Result Value  Ref Range   WBC 5.2 4.0 - 10.5 K/uL   RBC 4.20 3.87 - 5.11 MIL/uL   Hemoglobin 13.2 12.0 - 15.0 g/dL   HCT 40.1 36.0 - 46.0 %   MCV 95.5 78.0 - 100.0 fL   MCH 31.4 26.0 - 34.0 pg   MCHC 32.9 30.0 - 36.0 g/dL   RDW 11.7 11.5 - 15.5 %   Platelets 206 150 - 400 K/uL   Neutrophils Relative % 53 %   Neutro Abs 2.8 1.7 - 7.7 K/uL   Lymphocytes Relative 36 %   Lymphs Abs 1.9 0.7 - 4.0 K/uL   Monocytes Relative 9 %   Monocytes Absolute 0.5 0.1 - 1.0 K/uL   Eosinophils Relative 2 %   Eosinophils Absolute 0.1 0.0 - 0.7 K/uL   Basophils Relative 0 %   Basophils Absolute 0.0 0.0 - 0.1 K/uL  Comprehensive metabolic panel     Status: Abnormal   Collection Time: 09/06/17 12:35 AM  Result Value Ref Range   Sodium 136 135 - 145 mmol/L   Potassium 4.1 3.5 - 5.1 mmol/L   Chloride 103 101 - 111 mmol/L   CO2 26 22 - 32 mmol/L   Glucose, Bld 90 65 - 99 mg/dL   BUN 12 6 - 20 mg/dL   Creatinine, Ser 0.63 0.44 - 1.00 mg/dL   Calcium 9.1 8.9 - 10.3 mg/dL   Total Protein 7.9 6.5 - 8.1 g/dL   Albumin 4.1 3.5 - 5.0 g/dL   AST 24 15 - 41 U/L   ALT 18 14 - 54 U/L   Alkaline Phosphatase 53 38 - 126 U/L   Total Bilirubin 0.1 (L) 0.3 - 1.2 mg/dL   GFR calc non Af Amer >60 >60 mL/min   GFR calc Af Amer >60 >60 mL/min   Anion gap 7 5 - 15   Imaging Studies: No results found.  ED COURSE  Nursing notes and initial vitals signs, including pulse oximetry, reviewed.  Vitals:   09/05/17 2214 09/05/17 2215 09/06/17 0052  BP: 115/63  110/68  Pulse: 93  85  Resp: 18  16  Temp: 98.4 F (36.9 C)    TempSrc: Oral    SpO2: 100%  100%  Weight:  64.7 kg (142 lb 10.2 oz)   Height:  5\' 4"  (1.626 m)     PROCEDURES    ED DIAGNOSES     ICD-10-CM   1. Bilateral leg cramps R25.2        Contina Strain, MD 09/06/17 2505

## 2017-09-12 ENCOUNTER — Ambulatory Visit: Payer: Medicaid Other | Admitting: Family Medicine

## 2017-09-18 ENCOUNTER — Other Ambulatory Visit: Payer: Self-pay

## 2017-09-18 ENCOUNTER — Emergency Department (HOSPITAL_COMMUNITY): Payer: Medicaid Other

## 2017-09-18 ENCOUNTER — Encounter (HOSPITAL_COMMUNITY): Payer: Self-pay | Admitting: *Deleted

## 2017-09-18 ENCOUNTER — Emergency Department (HOSPITAL_COMMUNITY)
Admission: EM | Admit: 2017-09-18 | Discharge: 2017-09-18 | Disposition: A | Discharge status: Home / Self Care | Payer: Medicaid Other | Attending: Emergency Medicine | Admitting: Emergency Medicine

## 2017-09-18 DIAGNOSIS — R079 Chest pain, unspecified: Secondary | ICD-10-CM | POA: Insufficient documentation

## 2017-09-18 DIAGNOSIS — R0602 Shortness of breath: Secondary | ICD-10-CM | POA: Insufficient documentation

## 2017-09-18 DIAGNOSIS — Z5321 Procedure and treatment not carried out due to patient leaving prior to being seen by health care provider: Secondary | ICD-10-CM | POA: Insufficient documentation

## 2017-09-18 LAB — CBC
HCT: 39.5 % (ref 36.0–46.0)
Hemoglobin: 13 g/dL (ref 12.0–15.0)
MCH: 30.7 pg (ref 26.0–34.0)
MCHC: 32.9 g/dL (ref 30.0–36.0)
MCV: 93.2 fL (ref 78.0–100.0)
Platelets: 210 10*3/uL (ref 150–400)
RBC: 4.24 MIL/uL (ref 3.87–5.11)
RDW: 11.9 % (ref 11.5–15.5)
WBC: 3.2 10*3/uL — ABNORMAL LOW (ref 4.0–10.5)

## 2017-09-18 LAB — BASIC METABOLIC PANEL
Anion gap: 7 (ref 5–15)
BUN: 6 mg/dL (ref 6–20)
CO2: 25 mmol/L (ref 22–32)
Calcium: 8.6 mg/dL — ABNORMAL LOW (ref 8.9–10.3)
Chloride: 104 mmol/L (ref 101–111)
Creatinine, Ser: 0.69 mg/dL (ref 0.44–1.00)
GFR calc Af Amer: >60 mL/min (ref >60)
GFR calc non Af Amer: >60 mL/min (ref >60)
Glucose, Bld: 94 mg/dL (ref 65–99)
Potassium: 4 mmol/L (ref 3.5–5.1)
Sodium: 136 mmol/L (ref 135–145)

## 2017-09-18 LAB — I-STAT TROPONIN, ED: Troponin i, poc: 0 ng/mL (ref 0.00–0.08)

## 2017-09-18 NOTE — ED Notes (Signed)
Called pt name x3 to be roomed. No response. 

## 2017-09-18 NOTE — ED Triage Notes (Signed)
Pt c/o mid chest pain that radiates to back with SOB onset today, pain is intermittent, pt denies n/v/d, A&O x4

## 2017-09-19 ENCOUNTER — Encounter (HOSPITAL_COMMUNITY): Payer: Self-pay | Admitting: Emergency Medicine

## 2017-09-19 ENCOUNTER — Ambulatory Visit (HOSPITAL_COMMUNITY)
Admission: EM | Admit: 2017-09-19 | Discharge: 2017-09-19 | Disposition: A | Discharge status: Home / Self Care | Payer: Medicaid Other | Attending: Family Medicine | Admitting: Family Medicine

## 2017-09-19 DIAGNOSIS — M549 Dorsalgia, unspecified: Secondary | ICD-10-CM

## 2017-09-19 DIAGNOSIS — M940 Chondrocostal junction syndrome [Tietze]: Secondary | ICD-10-CM

## 2017-09-19 DIAGNOSIS — R0789 Other chest pain: Secondary | ICD-10-CM

## 2017-09-19 MED ORDER — NAPROXEN 500 MG PO TABS: 500.0000 mg | ORAL_TABLET | Freq: Two times a day (BID) | ORAL | 0 refills | Status: DC

## 2017-09-19 NOTE — ED Triage Notes (Signed)
PT reports central chest pain that started as she was driving to work yesterday. Pain is intermittent every 5 minutes or so. PT reports pain does not radiate. Pain is worse when taking a deep breath. Pain is worse with palpation. PT feels SOB during pain episodes. PT reports no cardiac history. PT went to the [facility] yesterday, but the wait was too long to stay. They drew blood and did an xray.

## 2017-09-19 NOTE — ED Provider Notes (Signed)
[place]    CSN: [id number] Arrival date & time: 09/19/17  1355     History   Chief Complaint Chief Complaint  Patient presents with  . Chest Pain    HPI Brianna Velez is a 41 y.o. female.   Black female who presents today with chest wall pain. Her pain began yesterday. It is episodic regardless of activity. It can be painful with deep breath or with ROM of her arms or palpation to the chest. No prior history of this presentation, though she has ongoing abdominal issues in which she is requiring frequent emergency room visits. She is due to f/u with Gastroenterologist. ROS are carefully questioned, see full ROS for details.  She did go to the ED last night and had to leave before anyone saw her. She did have labs, EKG and CXR performed but does not know the results.       Past Medical History:  Diagnosis Date  . Fibroids   . History of hiatal hernia   . Leg pain   . Migraine   . Sciatica     Patient Active Problem List   Diagnosis Date Noted  . Chronic pain syndrome 03/31/2016  . Screening for diabetes mellitus 12/31/2015  . S/P TAH (total abdominal hysterectomy) 09/29/2015  . Fibroid, uterine   . Menorrhagia with regular cycle   . Fibroid uterus 07/01/2015  . Left hip pain 11/06/2014    Past Surgical History:  Procedure Laterality Date  . ABDOMINAL HYSTERECTOMY  09/29/2015   Procedure: HYSTERECTOMY ABDOMINAL;  Surgeon: Mora Bellman, MD;  Location: [facility];  Service: Gynecology;;  . BILATERAL SALPINGECTOMY Bilateral 09/29/2015   Procedure: BILATERAL SALPINGECTOMY;  Surgeon: Mora Bellman, MD;  Location: [facility];  Service: Gynecology;  Laterality: Bilateral;  . TUBAL LIGATION    . WISDOM TOOTH EXTRACTION      OB History    Gravida Para Term Preterm AB Living   2 2 2  0 0 2   SAB TAB Ectopic Multiple Live Births   0 0 0 0         Home Medications    Prior to Admission medications   Medication Sig Start Date End Date Taking? Authorizing  Provider  Prenatal Vit-Fe Fumarate-FA (PRENATAL MULTIVITAMIN) TABS tablet Take 1 tablet by mouth daily at 12 noon.   Yes [provider]  traMADol (ULTRAM) 50 MG tablet Take 1 tablet (50 mg total) by mouth every 6 (six) hours as needed (for pain). 09/06/17  Yes Molpus, John, MD  cyclobenzaprine (FLEXERIL) 10 MG tablet Take 1 tablet (10 mg total) by mouth 3 (three) times daily as needed for muscle spasms. 09/06/17   Molpus, John, MD  furosemide (LASIX) 20 MG tablet Take 1 tablet (20 mg total) by mouth daily. 08/01/17   Scot Jun, FNP  gabapentin (NEURONTIN) 300 MG capsule Take 300 mg by mouth 3 (three) times daily. 05/26/17   [provider]    Family History Family History  Problem Relation Age of Onset  . Diabetes Sister   . Heart disease Paternal Grandmother   . Hypertension Father   . Dementia Mother   . Hypertension Mother     Social History Social History  Substance Use Topics  . Smoking status: Former Smoker    Quit date: 12/05/1996  . Smokeless tobacco: Never Used  . Alcohol use No     Allergies   Prednisone   Review of Systems Review of Systems  Constitutional: Negative for chills and  fever.  HENT: Negative.   Respiratory: Negative for cough, chest tightness, shortness of breath and wheezing.   Cardiovascular: Positive for chest pain. Negative for leg swelling.  Gastrointestinal: Positive for abdominal distention and abdominal pain.  Musculoskeletal: Positive for back pain. Negative for arthralgias.  Skin: Negative for rash.  Neurological: Negative.  Negative for light-headedness.     Physical Exam Triage Vital Signs ED Triage Vitals  Enc Vitals Group     BP 09/19/17 1414 122/89     Pulse Rate 09/19/17 1414 77     Resp 09/19/17 1414 16     Temp 09/19/17 1414 98.6 F (37 C)     Temp Source 09/19/17 1414 Oral     SpO2 09/19/17 1414 99 %     Weight 09/19/17 1412 140 lb (63.5 kg)     Height 09/19/17 1412 5\' 4"  (1.626 m)     Head  Circumference --      Peak Flow --      Pain Score 09/19/17 1412 8     Pain Loc --      Pain Edu? --      Excl. in [place]? --    No data found.   Updated Vital Signs BP 122/89   Pulse 77   Temp 98.6 F (37 C) (Oral)   Resp 16   Ht 5\' 4"  (1.626 m)   Wt 140 lb (63.5 kg)   LMP 08/14/2016   SpO2 99%   BMI 24.03 kg/m   Visual Acuity Right Eye Distance:   Left Eye Distance:   Bilateral Distance:    Right Eye Near:   Left Eye Near:    Bilateral Near:     Physical Exam  Constitutional: She is oriented to person, place, and time. She appears well-developed and well-nourished. No distress.  Patient sitting comfortable on exam table in NAD  Cardiovascular: Normal rate and regular rhythm.   Pulmonary/Chest: Effort normal and breath sounds normal. No respiratory distress. She has no wheezes. She exhibits tenderness.  Tenderness to light or deep palpation along the sternum, and anterior lower ribs, no deformities, pain with abduction of bilateral arms.   Neurological: She is alert and oriented to person, place, and time.  Skin: Skin is warm and dry. She is not diaphoretic.  Psychiatric: Her behavior is normal.  Nursing note and vitals reviewed.    UC Treatments / Results  Labs (all labs ordered are listed, but only abnormal results are displayed) Labs Reviewed - No data to display  EKG  EKG Interpretation None       Radiology Dg Chest 2 View  Result Date: 09/18/2017 CLINICAL DATA:  Chest pain EXAM: CHEST  2 VIEW COMPARISON:  07/23/2017 FINDINGS: Heart and mediastinal contours are within normal limits. No focal opacities or effusions. No acute bony abnormality. IMPRESSION: No active cardiopulmonary disease. Electronically Signed   By: Rolm Baptise M.D.   On: 09/18/2017 11:57    Procedures Procedures (including critical care time)  Medications Ordered in UC Medications - No data to display   Initial Impression / Assessment and Plan / UC Course  I have reviewed the  triage vital signs and the nursing notes.  Pertinent labs & imaging results that were available during my care of the patient were reviewed by me and considered in my medical decision making (see chart for details).   Serologies, CXR and EKG reviewed from last pm ED visit. No findings are noted that appear cardiac, and her overall nature of  pain would suggest Musculoskeletal in nature. At this time will treat with NSAIDs and warm heat. She will go to the ED if this worsens. She is also encouraged to f/u with her GI specialist regarding her ongoing abdominal distention and FU here as needed.    Final Clinical Impressions(s) / UC Diagnoses   Final diagnoses:  None    New Prescriptions New Prescriptions   No medications on file     Controlled Substance Prescriptions [place] Controlled Substance Registry consulted? Not Applicable   Bjorn Pippin, PA-C 09/19/17 1455

## 2017-09-19 NOTE — Discharge Instructions (Signed)
You seem to be having chest wall pain. Treat with medications twice a day with food. If you are not improving or get worse then f/u in the ED. Please keep f/u for you Abdominal issues.

## 2017-09-24 ENCOUNTER — Emergency Department (HOSPITAL_COMMUNITY)
Admission: EM | Admit: 2017-09-24 | Discharge: 2017-09-24 | Disposition: A | Discharge status: Home / Self Care | Payer: Self-pay | Attending: Emergency Medicine | Admitting: Emergency Medicine

## 2017-09-24 ENCOUNTER — Encounter (HOSPITAL_COMMUNITY): Payer: Self-pay

## 2017-09-24 ENCOUNTER — Other Ambulatory Visit: Payer: Self-pay

## 2017-09-24 ENCOUNTER — Emergency Department (HOSPITAL_COMMUNITY): Payer: Self-pay

## 2017-09-24 DIAGNOSIS — Z79899 Other long term (current) drug therapy: Secondary | ICD-10-CM | POA: Insufficient documentation

## 2017-09-24 DIAGNOSIS — R0789 Other chest pain: Secondary | ICD-10-CM | POA: Insufficient documentation

## 2017-09-24 DIAGNOSIS — Z87891 Personal history of nicotine dependence: Secondary | ICD-10-CM | POA: Insufficient documentation

## 2017-09-24 LAB — D-DIMER, QUANTITATIVE (NOT AT ARMC): D-Dimer, Quant: 1.69 ug/mL-FEU — ABNORMAL HIGH (ref 0.00–0.50)

## 2017-09-24 MED ORDER — TRAMADOL HCL 50 MG PO TABS: 50.0000 mg | ORAL_TABLET | Freq: Four times a day (QID) | ORAL | 0 refills | Status: DC | PRN

## 2017-09-24 MED ORDER — IOPAMIDOL (ISOVUE-370) INJECTION 76%
INTRAVENOUS | Status: AC
  Administered 2017-09-24: 100 mL
  Filled 2017-09-24: qty 100

## 2017-09-24 MED ORDER — TRAMADOL HCL 50 MG PO TABS
50.0000 mg | ORAL_TABLET | Freq: Once | ORAL | Status: AC
  Administered 2017-09-24: 50 mg via ORAL
  Filled 2017-09-24: qty 1

## 2017-09-24 NOTE — ED Notes (Signed)
Patient returned from CT and placed back on the monitor, Patient denies any complaints at this time.

## 2017-09-24 NOTE — ED Provider Notes (Signed)
[facility] Provider Note   CSN: [id number] Arrival date & time: 09/24/17  0846     History   Chief Complaint Chief Complaint  Patient presents with  . chestwall pain    HPI Brianna Velez is a 41 y.o. female.  Patient  is a 41 year old female with a history of recurrent abdominal pain who presents with  who presents with chest pain. She states it's been going on for about a week. She states it feels sore right in the middle of her chest. She states it feels like a muscle strain. It's worse when she tries to move or put her bra on. She denies any pleuritic pain.  She states the pain is been going on for about a week. She was seen at urgent care at that time and was given a prescription for Naprosyn which she has been using with no improvement in symptoms. She denies any cough or congestion. No fevers. No exertional symptoms. No shortness of breath. No leg pain or swelling.  She has been using Naprosyn without improvement in symptoms.      Past Medical History:  Diagnosis Date  . Fibroids   . History of hiatal hernia   . Leg pain   . Migraine   . Sciatica     Patient Active Problem List   Diagnosis Date Noted  . Chronic pain syndrome 03/31/2016  . Screening for diabetes mellitus 12/31/2015  . S/P TAH (total abdominal hysterectomy) 09/29/2015  . Fibroid, uterine   . Menorrhagia with regular cycle   . Fibroid uterus 07/01/2015  . Left hip pain 11/06/2014    Past Surgical History:  Procedure Laterality Date  . ABDOMINAL HYSTERECTOMY  09/29/2015   Procedure: HYSTERECTOMY ABDOMINAL;  Surgeon: Mora Bellman, MD;  Location: [facility];  Service: Gynecology;;  . BILATERAL SALPINGECTOMY Bilateral 09/29/2015   Procedure: BILATERAL SALPINGECTOMY;  Surgeon: Mora Bellman, MD;  Location: [facility];  Service: Gynecology;  Laterality: Bilateral;  . TUBAL LIGATION    . WISDOM TOOTH EXTRACTION      OB History    Gravida Para Term Preterm AB  Living   2 2 2  0 0 2   SAB TAB Ectopic Multiple Live Births   0 0 0 0         Home Medications    Prior to Admission medications   Medication Sig Start Date End Date Taking? Authorizing Provider  diphenhydrAMINE (BENADRYL) 25 MG tablet Take 25 mg by mouth every 6 (six) hours as needed.   Yes [provider]  Prenatal Vit-Fe Fumarate-FA (PRENATAL MULTIVITAMIN) TABS tablet Take 1 tablet by mouth daily at 12 noon.   Yes [provider]  cyclobenzaprine (FLEXERIL) 10 MG tablet Take 1 tablet (10 mg total) by mouth 3 (three) times daily as needed for muscle spasms. Patient not taking: Reported on 09/24/2017 09/06/17   Molpus, John, MD  furosemide (LASIX) 20 MG tablet Take 1 tablet (20 mg total) by mouth daily. Patient not taking: Reported on 09/24/2017 08/01/17   Scot Jun, FNP  gabapentin (NEURONTIN) 300 MG capsule Take 300 mg by mouth 3 (three) times daily. 05/26/17   [provider]  naproxen (NAPROSYN) 500 MG tablet Take 1 tablet (500 mg total) by mouth 2 (two) times daily. Patient not taking: Reported on 09/24/2017 09/19/17   Bjorn Pippin, PA-C  traMADol (ULTRAM) 50 MG tablet Take 1 tablet (50 mg total) by mouth every 6 (six) hours as needed. 09/24/17  Malvin Johns, MD    Family History Family History  Problem Relation Age of Onset  . Diabetes Sister   . Heart disease Paternal Grandmother   . Hypertension Father   . Dementia Mother   . Hypertension Mother     Social History Social History  Substance Use Topics  . Smoking status: Former Smoker    Quit date: 12/05/1996  . Smokeless tobacco: Never Used  . Alcohol use No     Allergies   Prednisone   Review of Systems Review of Systems  Constitutional: Negative for chills, diaphoresis, fatigue and fever.  HENT: Negative for congestion, rhinorrhea and sneezing.   Eyes: Negative.   Respiratory: Negative for cough, chest tightness and shortness of breath.   Cardiovascular: Positive  for chest pain. Negative for leg swelling.  Gastrointestinal: Negative for abdominal pain, blood in stool, diarrhea, nausea and vomiting.  Genitourinary: Negative for difficulty urinating, flank pain, frequency and hematuria.  Musculoskeletal: Negative for arthralgias and back pain.  Skin: Negative for rash.  Neurological: Negative for dizziness, speech difficulty, weakness, numbness and headaches.     Physical Exam Updated Vital Signs BP 110/74   Pulse 85   Temp (!) 97.4 F (36.3 C) (Oral)   Resp 16   Ht 5\' 4"  (1.626 m)   Wt 63.5 kg (140 lb)   LMP 08/14/2016   SpO2 100%   BMI 24.03 kg/m   Physical Exam  Constitutional: She is oriented to person, place, and time. She appears well-developed and well-nourished.  HENT:  Head: Normocephalic and atraumatic.  Eyes: Pupils are equal, round, and reactive to light.  Neck: Normal range of motion. Neck supple.  Cardiovascular: Normal rate, regular rhythm and normal heart sounds.   Pulmonary/Chest: Effort normal and breath sounds normal. No respiratory distress. She has no wheezes. She has no rales. She exhibits tenderness (Positive reproducible tenderness across the sternum).  Abdominal: Soft. Bowel sounds are normal. There is no tenderness. There is no rebound and no guarding.  Musculoskeletal: Normal range of motion. She exhibits no edema.  No edema or calf tenderness  Lymphadenopathy:    She has no cervical adenopathy.  Neurological: She is alert and oriented to person, place, and time.  Skin: Skin is warm and dry. No rash noted.  Psychiatric: She has a normal mood and affect.     ED Treatments / Results  Labs (all labs ordered are listed, but only abnormal results are displayed) Labs Reviewed  D-DIMER, QUANTITATIVE (NOT AT [facility]) - Abnormal; Notable for the following:       Result Value   D-Dimer, Quant 1.69 (*)    All other components within normal limits    EKG  EKG Interpretation  Date/Time:  Sunday September 24 2017  08:58:18 EDT Ventricular Rate:  94 PR Interval:  132 QRS Duration: 78 QT Interval:  356 QTC Calculation: 445 R Axis:   92 Text Interpretation:  Normal sinus rhythm Rightward axis Borderline ECG since last tracing no significant change Confirmed by Malvin Johns [phone]) on 09/24/2017 9:13:44 AM Also confirmed by Malvin Johns [phone])  on 09/24/2017 9:15:33 AM Also confirmed by Malvin Johns [phone]), editor Drema Pry [phone])  on 09/24/2017 9:29:03 AM       Radiology Ct Angio Chest Pe W/cm &/or Wo Cm  Result Date: 09/24/2017 CLINICAL DATA:  Chest tightness for 1 week.  Positive D-dimer. EXAM: CT ANGIOGRAPHY CHEST WITH CONTRAST TECHNIQUE: Multidetector CT imaging of the chest was performed using the standard protocol during bolus administration  of intravenous contrast. Multiplanar CT image reconstructions and MIPs were obtained to evaluate the vascular anatomy. CONTRAST:  100 cc of Isovue 370 COMPARISON:  Plain film 09/18/2017.  No prior CT. FINDINGS: Cardiovascular: The quality of this exam for evaluation of pulmonary embolism is good. No evidence of pulmonary embolism. Normal aortic caliber. Bovine arch. Normal heart size. Small pericardial effusion is favored to be physiologic. Mediastinum/Nodes: No mediastinal or hilar adenopathy. Lungs/Pleura: No pleural fluid. Minimal right lower lobe subsegmental atelectasis. Upper Abdomen: Normal imaged portions of the liver, spleen, stomach, pancreas, gallbladder, adrenal glands, right kidney. Increased density in the upper pole left kidney is favored to be due to early contrast excretion ; no stones in this area on prior abdominal CT. Musculoskeletal: No acute osseous abnormality. Review of the MIP images confirms the above findings. IMPRESSION: 1.  No evidence of pulmonary embolism. 2. No explanation for chest tightness. Electronically Signed   By: Abigail Miyamoto M.D.   On: 09/24/2017 14:29    Procedures Procedures (including critical care  time)  Medications Ordered in ED Medications  traMADol (ULTRAM) tablet 50 mg (not administered)  iopamidol (ISOVUE-370) 76 % injection (100 mLs  Contrast Given 09/24/17 1354)     Initial Impression / Assessment and Plan / ED Course  I have reviewed the triage vital signs and the nursing notes.  Pertinent labs & imaging results that were available during my care of the patient were reviewed by me and considered in my medical decision making (see chart for details).     Patient is a 41 year old who presents with chest pain.  It seems to be musculoskeletal in nature.  On her prior visit, she had a chest x-ray which showed no acute abnormalities.  She had labs including troponin which were negative.  Her symptoms do not seem to be consistent with acute coronary syndrome.  There is no evidence of pneumothorax or pneumonia on her prior chest x-ray and her symptoms have not really changed so I do not feel that repeat imaging is indicated.  I did check a d-dimer given her nature of the pain and this was positive.  A CT Angio was performed which shows no evidence of pulmonary embolus.  She was discharged home in good condition.  Her symptoms seem to be consistent with musculoskeletal pain.  She was given a prescription for tramadol for pain.  She was also encouraged to continue the Naprosyn.  She was encouraged to follow-up with her PCP.  Return precautions were given.  Final Clinical Impressions(s) / ED Diagnoses   Final diagnoses:  Chest wall pain    New Prescriptions New Prescriptions   TRAMADOL (ULTRAM) 50 MG TABLET    Take 1 tablet (50 mg total) by mouth every 6 (six) hours as needed.     Malvin Johns, MD 09/24/17 [phone]

## 2017-09-24 NOTE — ED Triage Notes (Addendum)
Patient complains of ongoing chest wall pain for several days. Seen for same and had the cardiac work-up. Taking naprosyn with minimal relief. Has been doing a lot of lifting, NAD. Diagnosed with costochondritis on 10/16

## 2017-09-24 NOTE — ED Notes (Signed)
IV team at bedside,  

## 2017-09-24 NOTE — ED Notes (Signed)
RN attempted IV x2 unsuccessful IV team order placed.

## 2017-09-24 NOTE — ED Notes (Signed)
MD aware, IV team unable to get IV for CT angio.

## 2017-09-25 ENCOUNTER — Ambulatory Visit: Payer: Medicaid Other | Admitting: Internal Medicine

## 2017-10-04 ENCOUNTER — Emergency Department (HOSPITAL_COMMUNITY)
Admission: EM | Admit: 2017-10-04 | Discharge: 2017-10-04 | Disposition: A | Discharge status: Home / Self Care | Payer: Medicaid Other | Attending: Emergency Medicine | Admitting: Emergency Medicine

## 2017-10-04 ENCOUNTER — Encounter (HOSPITAL_COMMUNITY): Payer: Self-pay | Admitting: Emergency Medicine

## 2017-10-04 DIAGNOSIS — J029 Acute pharyngitis, unspecified: Secondary | ICD-10-CM | POA: Insufficient documentation

## 2017-10-04 DIAGNOSIS — Z79899 Other long term (current) drug therapy: Secondary | ICD-10-CM | POA: Insufficient documentation

## 2017-10-04 DIAGNOSIS — Z87891 Personal history of nicotine dependence: Secondary | ICD-10-CM | POA: Insufficient documentation

## 2017-10-04 DIAGNOSIS — M79605 Pain in left leg: Secondary | ICD-10-CM | POA: Insufficient documentation

## 2017-10-04 LAB — RAPID STREP SCREEN (MED CTR MEBANE ONLY): Streptococcus, Group A Screen (Direct): NEGATIVE

## 2017-10-04 MED ORDER — DICLOFENAC SODIUM 75 MG PO TBEC
75.0000 mg | DELAYED_RELEASE_TABLET | Freq: Once | ORAL | Status: AC
  Administered 2017-10-04: 75 mg via ORAL
  Filled 2017-10-04: qty 1

## 2017-10-04 MED ORDER — DICLOFENAC SODIUM 75 MG PO TBEC: 75.0000 mg | DELAYED_RELEASE_TABLET | Freq: Two times a day (BID) | ORAL | 0 refills | Status: DC

## 2017-10-04 NOTE — ED Provider Notes (Signed)
[place] EMERGENCY DEPARTMENT Provider Note   CSN: [id number] Arrival date & time: 10/04/17  5361     History   Chief Complaint Chief Complaint  Patient presents with  . Leg Pain  . Sore Throat    HPI Brianna Velez is a 41 y.o. female.  The history is provided by the patient. No language interpreter was used.  Sore Throat  This is a new problem. The current episode started 12 to 24 hours ago. The problem occurs constantly. The problem has been gradually worsening. Nothing aggravates the symptoms. Nothing relieves the symptoms. She has tried nothing for the symptoms. The treatment provided no relief.  Pt reports she was exposed to someone who has a sorethroat.  Pt also has chronic leg pain on her left.  Pt reports she has sciatica.    Past Medical History:  Diagnosis Date  . Fibroids   . History of hiatal hernia   . Leg pain   . Migraine   . Sciatica     Patient Active Problem List   Diagnosis Date Noted  . Chronic pain syndrome 03/31/2016  . Screening for diabetes mellitus 12/31/2015  . S/P TAH (total abdominal hysterectomy) 09/29/2015  . Fibroid, uterine   . Menorrhagia with regular cycle   . Fibroid uterus 07/01/2015  . Left hip pain 11/06/2014    Past Surgical History:  Procedure Laterality Date  . ABDOMINAL HYSTERECTOMY  09/29/2015   Procedure: HYSTERECTOMY ABDOMINAL;  Surgeon: Mora Bellman, MD;  Location: [facility];  Service: Gynecology;;  . BILATERAL SALPINGECTOMY Bilateral 09/29/2015   Procedure: BILATERAL SALPINGECTOMY;  Surgeon: Mora Bellman, MD;  Location: [facility];  Service: Gynecology;  Laterality: Bilateral;  . TUBAL LIGATION    . WISDOM TOOTH EXTRACTION      OB History    Gravida Para Term Preterm AB Living   2 2 2  0 0 2   SAB TAB Ectopic Multiple Live Births   0 0 0 0         Home Medications    Prior to Admission medications   Medication Sig Start Date End Date Taking? Authorizing Provider  cyclobenzaprine  (FLEXERIL) 10 MG tablet Take 1 tablet (10 mg total) by mouth 3 (three) times daily as needed for muscle spasms. Patient not taking: Reported on 09/24/2017 09/06/17   Molpus, Jenny Reichmann, MD  diphenhydrAMINE (BENADRYL) 25 MG tablet Take 25 mg by mouth every 6 (six) hours as needed.    [provider]  furosemide (LASIX) 20 MG tablet Take 1 tablet (20 mg total) by mouth daily. Patient not taking: Reported on 09/24/2017 08/01/17   Scot Jun, FNP  gabapentin (NEURONTIN) 300 MG capsule Take 300 mg by mouth 3 (three) times daily. 05/26/17   [provider]  naproxen (NAPROSYN) 500 MG tablet Take 1 tablet (500 mg total) by mouth 2 (two) times daily. Patient not taking: Reported on 09/24/2017 09/19/17   Bjorn Pippin, PA-C  Prenatal Vit-Fe Fumarate-FA (PRENATAL MULTIVITAMIN) TABS tablet Take 1 tablet by mouth daily at 12 noon.    [provider]  traMADol (ULTRAM) 50 MG tablet Take 1 tablet (50 mg total) by mouth every 6 (six) hours as needed. 09/24/17   Malvin Johns, MD    Family History Family History  Problem Relation Age of Onset  . Diabetes Sister   . Heart disease Paternal Grandmother   . Hypertension Father   . Dementia Mother   . Hypertension Mother     Social History  Social History  Substance Use Topics  . Smoking status: Former Smoker    Quit date: 12/05/1996  . Smokeless tobacco: Never Used  . Alcohol use No     Allergies   Prednisone   Review of Systems Review of Systems  All other systems reviewed and are negative.    Physical Exam Updated Vital Signs BP 115/81 (BP Location: Left Arm)   Pulse 85   Temp 97.9 F (36.6 C) (Oral)   Resp 20   LMP 08/14/2016   SpO2 100%   Physical Exam  Constitutional: She is oriented to person, place, and time. She appears well-developed and well-nourished.  HENT:  Head: Normocephalic.  Right Ear: External ear normal.  Left Ear: External ear normal.  Nose: Nose normal.  Erythema throat, no  exudate  Eyes: EOM are normal.  Neck: Normal range of motion.  Pulmonary/Chest: Effort normal.  Abdominal: She exhibits no distension.  Musculoskeletal: Normal range of motion.  Neurological: She is alert and oriented to person, place, and time.  Psychiatric: She has a normal mood and affect.  Nursing note and vitals reviewed.    ED Treatments / Results  Labs (all labs ordered are listed, but only abnormal results are displayed) Labs Reviewed  RAPID STREP SCREEN (NOT AT [facility])   Strep negative EKG  EKG Interpretation None       Radiology No results found.  Procedures Procedures (including critical care time)  Medications Ordered in ED Medications - No data to display   Initial Impression / Assessment and Plan / ED Course  I have reviewed the triage vital signs and the nursing notes.  Pertinent labs & imaging results that were available during my care of the patient were reviewed by me and considered in my medical decision making (see chart for details).       Final Clinical Impressions(s) / ED Diagnoses   Final diagnoses:  Pharyngitis, unspecified etiology  Left leg pain    New Prescriptions Discharge Medication List as of 10/04/2017 10:04 AM    START taking these medications   Details  diclofenac (VOLTAREN) 75 MG EC tablet Take 1 tablet (75 mg total) by mouth 2 (two) times daily., Starting Wed 10/04/2017, Print         Fransico Meadow, [place] 10/04/17 1107    Tegeler, Gwenyth Allegra, MD 10/30/17 [phone]

## 2017-10-04 NOTE — ED Triage Notes (Signed)
Pt reports sore throat and left leg pain since yesterday, denies injury to leg, no fever or chills. nad

## 2017-10-04 NOTE — ED Notes (Signed)
Pt states that husband has strep throat- now has sore throat-- also c/o left leg pain, chronic-- comes and goes sometimes with weather change.

## 2017-10-06 ENCOUNTER — Encounter (HOSPITAL_COMMUNITY): Payer: Self-pay

## 2017-10-06 ENCOUNTER — Emergency Department (HOSPITAL_COMMUNITY)
Admission: EM | Admit: 2017-10-06 | Discharge: 2017-10-06 | Disposition: A | Discharge status: Home / Self Care | Payer: Self-pay | Attending: Emergency Medicine | Admitting: Emergency Medicine

## 2017-10-06 DIAGNOSIS — G8929 Other chronic pain: Secondary | ICD-10-CM | POA: Insufficient documentation

## 2017-10-06 DIAGNOSIS — Z87891 Personal history of nicotine dependence: Secondary | ICD-10-CM | POA: Insufficient documentation

## 2017-10-06 DIAGNOSIS — Z79899 Other long term (current) drug therapy: Secondary | ICD-10-CM | POA: Insufficient documentation

## 2017-10-06 DIAGNOSIS — M5442 Lumbago with sciatica, left side: Secondary | ICD-10-CM | POA: Insufficient documentation

## 2017-10-06 LAB — CULTURE, GROUP A STREP (THRC)

## 2017-10-06 MED ORDER — NAPROXEN 250 MG PO TABS
250.0000 mg | ORAL_TABLET | Freq: Once | ORAL | Status: AC
  Administered 2017-10-06: 250 mg via ORAL
  Filled 2017-10-06: qty 1

## 2017-10-06 MED ORDER — NAPROXEN 250 MG PO TABS
500.0000 mg | ORAL_TABLET | Freq: Once | ORAL | Status: AC
  Administered 2017-10-06: 500 mg via ORAL
  Filled 2017-10-06: qty 2

## 2017-10-06 MED ORDER — NAPROXEN 500 MG PO TABS: 500.0000 mg | ORAL_TABLET | Freq: Two times a day (BID) | ORAL | 0 refills | Status: AC

## 2017-10-06 MED ORDER — CYCLOBENZAPRINE HCL 10 MG PO TABS: 10.0000 mg | ORAL_TABLET | Freq: Two times a day (BID) | ORAL | 0 refills | Status: DC | PRN

## 2017-10-06 NOTE — ED Triage Notes (Signed)
Pt endorses recurrent left lower back pain with radiation down the left leg, pt has hx of sciatica. VSS. Seen here last week for the same.

## 2017-10-06 NOTE — Discharge Instructions (Signed)
Take naproxen twice a day with meals.  Do not take other anti-inflammatories at the same time (Advil, Motrin, ibuprofen, Aleve).  You may take Tylenol if you need further pain control. Use ice or heat if this helps control your pain. Use Flexeril twice a day as needed for muscle stiffness or soreness.  Have caution, as this may make you tired or drowsy.  Do not drive or operate heavy machinery until you know how this medicine affects you. You may try the back exercises in the paperwork. It is very important that you establish primary care.  You may contact [facility] and wellness to set up an appointment.  If your pain is not improved in 10 days, you may follow-up with them.  Additionally, they may be able to assist you with getting Medicaid or an orange card. Return to the emergency room if you develop fevers, loss of bowel or bladder control, inability to walk, or any new or worsening symptoms.

## 2017-10-06 NOTE — ED Provider Notes (Signed)
[facility] [place] EMERGENCY DEPARTMENT Provider Note   CSN: [id number] Arrival date & time: 10/06/17  1205     History   Chief Complaint Chief Complaint  Patient presents with  . Leg Pain    HPI Brianna Velez is a 41 y.o. female presenting with back pain.  Patient states that she was at work today when she bent forward to pick something up.  As she stood up, she had acute onset left-sided low back pain that radiated down the back of her leg.  She states that this feels like when she has a sciatic flare.  She has not had anything for pain.  Movement of her leg and back makes his pain worse, nothing makes it better.  She denies numbness or tingling.  She denies fall, trauma, or injury.  She denies pain on the right side.  She states she takes pain medicines when she has sciatic flares.  Patient states she has tried lidocaine patches, muscle relaxers, NSAIDs, and Voltaren gel without improvement of symptoms in the past.  She states she has tried steroids without success and she does not want to try them again.  She says she does not have a primary care currently, as she lost her Medicaid.  She denies red flags of back pain including fever, chills, rash, history of cancer, history of IV drug use, loss of bowel or bladder control, numbness, or fall.  She states she is only taking vitamins daily, not taking any other medicines.   HPI  Past Medical History:  Diagnosis Date  . Fibroids   . History of hiatal hernia   . Leg pain   . Migraine   . Sciatica     Patient Active Problem List   Diagnosis Date Noted  . Chronic pain syndrome 03/31/2016  . Screening for diabetes mellitus 12/31/2015  . S/P TAH (total abdominal hysterectomy) 09/29/2015  . Fibroid, uterine   . Menorrhagia with regular cycle   . Fibroid uterus 07/01/2015  . Left hip pain 11/06/2014    Past Surgical History:  Procedure Laterality Date  . ABDOMINAL HYSTERECTOMY  09/29/2015   Procedure: HYSTERECTOMY  ABDOMINAL;  Surgeon: Mora Bellman, MD;  Location: [facility];  Service: Gynecology;;  . BILATERAL SALPINGECTOMY Bilateral 09/29/2015   Procedure: BILATERAL SALPINGECTOMY;  Surgeon: Mora Bellman, MD;  Location: [facility];  Service: Gynecology;  Laterality: Bilateral;  . TUBAL LIGATION    . WISDOM TOOTH EXTRACTION      OB History    Gravida Para Term Preterm AB Living   2 2 2  0 0 2   SAB TAB Ectopic Multiple Live Births   0 0 0 0         Home Medications    Prior to Admission medications   Medication Sig Start Date End Date Taking? Authorizing Provider  cyclobenzaprine (FLEXERIL) 10 MG tablet Take 1 tablet (10 mg total) by mouth 2 (two) times daily as needed for muscle spasms. 10/06/17   Tyriana Helmkamp, PA-C  diclofenac (VOLTAREN) 75 MG EC tablet Take 1 tablet (75 mg total) by mouth 2 (two) times daily. 10/04/17   Fransico Meadow, PA-C  diphenhydrAMINE (BENADRYL) 25 MG tablet Take 25 mg by mouth every 6 (six) hours as needed.    [provider]  furosemide (LASIX) 20 MG tablet Take 1 tablet (20 mg total) by mouth daily. Patient not taking: Reported on 09/24/2017 08/01/17   Scot Jun, FNP  gabapentin (NEURONTIN) 300 MG capsule Take 300 mg  by mouth 3 (three) times daily. 05/26/17   [provider]  naproxen (NAPROSYN) 500 MG tablet Take 1 tablet (500 mg total) by mouth 2 (two) times daily with a meal. 10/06/17 10/16/17  Waylan Busta, PA-C  Prenatal Vit-Fe Fumarate-FA (PRENATAL MULTIVITAMIN) TABS tablet Take 1 tablet by mouth daily at 12 noon.    [provider]  traMADol (ULTRAM) 50 MG tablet Take 1 tablet (50 mg total) by mouth every 6 (six) hours as needed. 09/24/17   Malvin Johns, MD    Family History Family History  Problem Relation Age of Onset  . Diabetes Sister   . Heart disease Paternal Grandmother   . Hypertension Father   . Dementia Mother   . Hypertension Mother     Social History Social History  Substance Use Topics  . Smoking  status: Former Smoker    Quit date: 12/05/1996  . Smokeless tobacco: Never Used  . Alcohol use No     Allergies   Prednisone   Review of Systems Review of Systems  Musculoskeletal: Positive for back pain.  Skin: Negative for wound.  Neurological: Negative for numbness.     Physical Exam Updated Vital Signs BP 105/63 (BP Location: Right Arm)   Pulse 77   Temp 98 F (36.7 C) (Oral)   Resp 12   Ht 5\' 4"  (1.626 m)   Wt 63.5 kg (140 lb)   LMP 08/14/2016   SpO2 100%   BMI 24.03 kg/m   Physical Exam  Constitutional: She is oriented to person, place, and time. She appears well-developed and well-nourished. No distress.  HENT:  Head: Normocephalic and atraumatic.  Eyes: EOM are normal.  Neck: Normal range of motion.  Cardiovascular: Normal rate, regular rhythm and intact distal pulses.   Pulmonary/Chest: Effort normal and breath sounds normal. No respiratory distress. She has no wheezes.  Abdominal: She exhibits no distension.  Musculoskeletal: Normal range of motion. She exhibits tenderness.  Tenderness to palpation of left lower back musculature.  No tenderness to palpation over midline spine.  No pain on the right side.  With extension of the leg, pain radiates past the knee.  Sensation intact bilaterally.  Pedal pulses equal bilaterally.  Patient is ambulatory with pain.  Neurological: She is alert and oriented to person, place, and time. No sensory deficit.  Skin: Skin is warm and dry.  Psychiatric: She has a normal mood and affect.  Nursing note and vitals reviewed.    ED Treatments / Results  Labs (all labs ordered are listed, but only abnormal results are displayed) Labs Reviewed - No data to display  EKG  EKG Interpretation None       Radiology No results found.  Procedures Procedures (including critical care time)  Medications Ordered in ED Medications  naproxen (NAPROSYN) tablet 500 mg (500 mg Oral Given 10/06/17 1517)  naproxen (NAPROSYN) tablet  250 mg (250 mg Oral Given 10/06/17 1517)     Initial Impression / Assessment and Plan / ED Course  I have reviewed the triage vital signs and the nursing notes.  Pertinent labs & imaging results that were available during my care of the patient were reviewed by me and considered in my medical decision making (see chart for details).     Patient presented with acute on chronic left-sided low back pain with sciatica.  Physical exam shows no gross neurologic deficits.  Consistent with sciatica.  No red flags for back pain.  I do not believe she needs imaging at  this time.  Will treat conservatively with naproxen and muscle relaxer.  First dose of naproxen given in the ED. Patient given information for [facility] and wellness to follow-up and establish primary care.  At this time, patient appears safe for discharge.  Return precautions given.  Patient states she understands and agrees to plan.   Final Clinical Impressions(s) / ED Diagnoses   Final diagnoses:  Chronic left-sided low back pain with left-sided sciatica    New Prescriptions Discharge Medication List as of 10/06/2017  2:54 PM       Franchot Heidelberg, PA-C 10/06/17 1721    Milton Ferguson, MD 10/07/17 1158

## 2017-10-12 ENCOUNTER — Emergency Department (HOSPITAL_COMMUNITY): Payer: Self-pay

## 2017-10-12 ENCOUNTER — Encounter (HOSPITAL_COMMUNITY): Payer: Self-pay

## 2017-10-12 ENCOUNTER — Emergency Department (HOSPITAL_COMMUNITY)
Admission: EM | Admit: 2017-10-12 | Discharge: 2017-10-13 | Disposition: A | Discharge status: Home / Self Care | Payer: Self-pay | Attending: Emergency Medicine | Admitting: Emergency Medicine

## 2017-10-12 DIAGNOSIS — R0789 Other chest pain: Secondary | ICD-10-CM | POA: Insufficient documentation

## 2017-10-12 DIAGNOSIS — Z5321 Procedure and treatment not carried out due to patient leaving prior to being seen by health care provider: Secondary | ICD-10-CM | POA: Insufficient documentation

## 2017-10-12 LAB — I-STAT TROPONIN, ED: Troponin i, poc: 0 ng/mL (ref 0.00–0.08)

## 2017-10-12 LAB — CBC
HCT: 38.8 % (ref 36.0–46.0)
Hemoglobin: 12.7 g/dL (ref 12.0–15.0)
MCH: 30.5 pg (ref 26.0–34.0)
MCHC: 32.7 g/dL (ref 30.0–36.0)
MCV: 93.3 fL (ref 78.0–100.0)
Platelets: 226 10*3/uL (ref 150–400)
RBC: 4.16 MIL/uL (ref 3.87–5.11)
RDW: 11.8 % (ref 11.5–15.5)
WBC: 4.3 10*3/uL (ref 4.0–10.5)

## 2017-10-12 LAB — BASIC METABOLIC PANEL
Anion gap: 7 (ref 5–15)
BUN: 9 mg/dL (ref 6–20)
CO2: 25 mmol/L (ref 22–32)
Calcium: 9.1 mg/dL (ref 8.9–10.3)
Chloride: 106 mmol/L (ref 101–111)
Creatinine, Ser: 0.82 mg/dL (ref 0.44–1.00)
GFR calc Af Amer: >60 mL/min (ref >60)
GFR calc non Af Amer: >60 mL/min (ref >60)
Glucose, Bld: 109 mg/dL — ABNORMAL HIGH (ref 65–99)
Potassium: 4 mmol/L (ref 3.5–5.1)
Sodium: 138 mmol/L (ref 135–145)

## 2017-10-12 NOTE — ED Triage Notes (Signed)
Pt states that for the past several weeks she has been having central CP with radiation to back. Denies SOB, pt states that she has lesions on her liver and spleen and want that checked also.

## 2017-10-18 ENCOUNTER — Emergency Department (HOSPITAL_COMMUNITY)
Admission: EM | Admit: 2017-10-18 | Discharge: 2017-10-18 | Disposition: A | Discharge status: Home / Self Care | Payer: Self-pay | Attending: Physician Assistant | Admitting: Physician Assistant

## 2017-10-18 ENCOUNTER — Encounter (HOSPITAL_COMMUNITY): Payer: Self-pay | Admitting: *Deleted

## 2017-10-18 ENCOUNTER — Emergency Department (HOSPITAL_COMMUNITY): Payer: Self-pay

## 2017-10-18 DIAGNOSIS — Z87891 Personal history of nicotine dependence: Secondary | ICD-10-CM | POA: Insufficient documentation

## 2017-10-18 DIAGNOSIS — Z79899 Other long term (current) drug therapy: Secondary | ICD-10-CM | POA: Insufficient documentation

## 2017-10-18 DIAGNOSIS — R0789 Other chest pain: Secondary | ICD-10-CM | POA: Insufficient documentation

## 2017-10-18 LAB — CBC
HCT: 43.1 % (ref 36.0–46.0)
Hemoglobin: 14.5 g/dL (ref 12.0–15.0)
MCH: 31.7 pg (ref 26.0–34.0)
MCHC: 33.6 g/dL (ref 30.0–36.0)
MCV: 94.1 fL (ref 78.0–100.0)
Platelets: 201 10*3/uL (ref 150–400)
RBC: 4.58 MIL/uL (ref 3.87–5.11)
RDW: 12.1 % (ref 11.5–15.5)
WBC: 4.1 10*3/uL (ref 4.0–10.5)

## 2017-10-18 LAB — COMPREHENSIVE METABOLIC PANEL
ALT: 13 U/L — ABNORMAL LOW (ref 14–54)
AST: 21 U/L (ref 15–41)
Albumin: 4.5 g/dL (ref 3.5–5.0)
Alkaline Phosphatase: 63 U/L (ref 38–126)
Anion gap: 9 (ref 5–15)
BUN: 12 mg/dL (ref 6–20)
CO2: 28 mmol/L (ref 22–32)
Calcium: 9.8 mg/dL (ref 8.9–10.3)
Chloride: 99 mmol/L — ABNORMAL LOW (ref 101–111)
Creatinine, Ser: 0.91 mg/dL (ref 0.44–1.00)
GFR calc Af Amer: >60 mL/min (ref >60)
GFR calc non Af Amer: >60 mL/min (ref >60)
Glucose, Bld: 91 mg/dL (ref 65–99)
Potassium: 4.3 mmol/L (ref 3.5–5.1)
Sodium: 136 mmol/L (ref 135–145)
Total Bilirubin: 0.5 mg/dL (ref 0.3–1.2)
Total Protein: 8.7 g/dL — ABNORMAL HIGH (ref 6.5–8.1)

## 2017-10-18 LAB — I-STAT TROPONIN, ED: Troponin i, poc: 0 ng/mL (ref 0.00–0.08)

## 2017-10-18 MED ORDER — KETOROLAC TROMETHAMINE 30 MG/ML IJ SOLN
30.0000 mg | Freq: Once | INTRAMUSCULAR | Status: AC
  Administered 2017-10-18: 30 mg via INTRAVENOUS
  Filled 2017-10-18: qty 1

## 2017-10-18 MED ORDER — CYCLOBENZAPRINE HCL 10 MG PO TABS: 10.0000 mg | ORAL_TABLET | Freq: Two times a day (BID) | ORAL | 0 refills | Status: DC | PRN

## 2017-10-18 NOTE — ED Provider Notes (Signed)
[facility] DEPT Provider Note   CSN: [id number] Arrival date & time: 10/18/17  1036     History   Chief Complaint Chief Complaint  Patient presents with  . Chest Pain    HPI Brianna Velez is a 41 y.o. female.  The history is provided by the patient and medical records. No language interpreter was used.  Chest Pain   Pertinent negatives include no palpitations.   Brianna Velez is a 41 y.o. female  with a PMH of chronic pain syndrome who presents to the Emergency Department complaining of 2-3 days of central chest pain which is worse with certain movements. Pain will intermittently radiate up to the shoulders. History of similar where she has been seen in the ER recently, but states that no one has told her why she is experiencing this pain. She has tried ibuprofen at home with no improvement. No shortness of breath, nausea, vomiting, abdominal pain, diarrhea, constipation, cough, congestion.   Past Medical History:  Diagnosis Date  . Fibroids   . History of hiatal hernia   . Leg pain   . Migraine   . Sciatica     Patient Active Problem List   Diagnosis Date Noted  . Chronic pain syndrome 03/31/2016  . Screening for diabetes mellitus 12/31/2015  . S/P TAH (total abdominal hysterectomy) 09/29/2015  . Fibroid, uterine   . Menorrhagia with regular cycle   . Fibroid uterus 07/01/2015  . Left hip pain 11/06/2014    Past Surgical History:  Procedure Laterality Date  . TUBAL LIGATION    . WISDOM TOOTH EXTRACTION      OB History    Gravida Para Term Preterm AB Living   2 2 2  0 0 2   SAB TAB Ectopic Multiple Live Births   0 0 0 0         Home Medications    Prior to Admission medications   Medication Sig Start Date End Date Taking? Authorizing Provider  diphenhydrAMINE (BENADRYL) 25 MG tablet Take 25 mg by mouth every 6 (six) hours as needed.   Yes [provider]  naproxen (NAPROSYN) 500 MG tablet Take 500 mg every 4  (four) hours as needed by mouth for moderate pain.   Yes [provider]  Prenatal Vit-Fe Fumarate-FA (PRENATAL MULTIVITAMIN) TABS tablet Take 1 tablet by mouth daily at 12 noon.   Yes [provider]  cyclobenzaprine (FLEXERIL) 10 MG tablet Take 1 tablet (10 mg total) by mouth 2 (two) times daily as needed for muscle spasms. Patient not taking: Reported on 10/18/2017 10/06/17   Caccavale, Sophia, PA-C  diclofenac (VOLTAREN) 75 MG EC tablet Take 1 tablet (75 mg total) by mouth 2 (two) times daily. Patient not taking: Reported on 10/18/2017 10/04/17   Fransico Meadow, PA-C  furosemide (LASIX) 20 MG tablet Take 1 tablet (20 mg total) by mouth daily. Patient not taking: Reported on 09/24/2017 08/01/17   Scot Jun, FNP  traMADol (ULTRAM) 50 MG tablet Take 1 tablet (50 mg total) by mouth every 6 (six) hours as needed. Patient not taking: Reported on 10/18/2017 09/24/17   Malvin Johns, MD    Family History Family History  Problem Relation Age of Onset  . Diabetes Sister   . Heart disease Paternal Grandmother   . Hypertension Father   . Dementia Mother   . Hypertension Mother     Social History Social History   Tobacco Use  . Smoking status: Former Smoker  Last attempt to quit: 12/05/1996    Years since quitting: 20.8  . Smokeless tobacco: Never Used  Substance Use Topics  . Alcohol use: No  . Drug use: No     Allergies   Prednisone   Review of Systems Review of Systems  Cardiovascular: Positive for chest pain. Negative for palpitations and leg swelling.  All other systems reviewed and are negative.   Physical Exam Updated Vital Signs BP 121/83 (BP Location: Left Arm)   Pulse 92   Temp 97.9 F (36.6 C) (Oral)   Resp 16   LMP 08/14/2016   SpO2 100%   Physical Exam  Constitutional: She is oriented to person, place, and time. She appears well-developed and well-nourished. No distress.  HENT:  Head: Normocephalic and atraumatic.    Cardiovascular: Normal rate, regular rhythm, normal heart sounds and intact distal pulses.  No murmur heard. Pulmonary/Chest: Effort normal and breath sounds normal. No stridor. No respiratory distress. She has no wheezes. She has no rales. She exhibits tenderness.  Abdominal: Soft. There is no tenderness.  Musculoskeletal: She exhibits no edema.  Neurological: She is alert and oriented to person, place, and time.  Skin: Skin is warm and dry.  Nursing note and vitals reviewed.    ED Treatments / Results  Labs (all labs ordered are listed, but only abnormal results are displayed) Labs Reviewed  COMPREHENSIVE METABOLIC PANEL - Abnormal; Notable for the following components:      Result Value   Chloride 99 (*)    Total Protein 8.7 (*)    ALT 13 (*)    All other components within normal limits  CBC  I-STAT TROPONIN, ED    EKG  EKG Interpretation  Date/Time:  Wednesday October 18 2017 10:48:35 EST Ventricular Rate:  103 PR Interval:    QRS Duration: 80 QT Interval:  315 QTC Calculation: 413 R Axis:   86 Text Interpretation:  Normal sinus rhythm Confirmed by Thomasene Lot, [place] [phone]) on 10/18/2017 1:32:32 PM       Radiology Dg Chest 2 View  Result Date: 10/18/2017 CLINICAL DATA:  Days of chest pain with increased severity today. Former smoker. EXAM: CHEST  2 VIEW COMPARISON:  Chest x-ray of October 12, 2017 FINDINGS: The lungs are adequately inflated and clear. The heart and mediastinal structures are normal. There is no pleural effusion. The bony thorax is unremarkable. IMPRESSION: There is no active cardiopulmonary disease. Electronically Signed   By: David  Martinique M.D.   On: 10/18/2017 12:23    Procedures Procedures (including critical care time)  Medications Ordered in ED Medications  ketorolac (TORADOL) 30 MG/ML injection 30 mg (30 mg Intravenous Given 10/18/17 1200)     Initial Impression / Assessment and Plan / ED Course  I have reviewed the triage vital  signs and the nursing notes.  Pertinent labs & imaging results that were available during my care of the patient were reviewed by me and considered in my medical decision making (see chart for details).    Brianna Velez is a 41 y.o. female who presents to ED for chest pain x2-3 days On exam, patient is afebrile, hemodynamically stable with tenderness along chest wall, but otherwise normal cardiopulmonary examination.   Labs reviewed and reassuring with negative troponin.   CXR with no acute abnormalities.  EKG NSR  Low risk heart score. PERC negative. Per chart review, seen in ED 3x for similar recently. Did have negative CT angio on 10/21 as well. Patient has an appointment with PCP  for initial visit in 2 weeks.   Patient's symptoms unlikely to be of cardiac etiology. Likely musk. Labs and imaging reviewed again prior to discharge. Patient has been advised to return to the ED if development of any exertional chest pain, trouble breathing, new/worsening symptoms or for any additional concerns. Evaluation does not show pathology that would require ongoing emergent intervention or inpatient treatment. Encouraged to follow up with PCP at scheduled appointment. Patient understands return precautions and follow up plan. All questions answered.   Final Clinical Impressions(s) / ED Diagnoses   Final diagnoses:  Chest wall pain    ED Discharge Orders    None        Ward, Ozella Almond, PA-C 10/18/17 1421    Macarthur Critchley, MD 10/19/17 1048

## 2017-10-18 NOTE — ED Triage Notes (Signed)
Pt complains of chest pain radiating to back and bilateral shoulders, nausea for the past 2 days. Pt denies shortness of breath. Pt states she has problems with her liver and spleen.

## 2017-10-18 NOTE — Discharge Instructions (Signed)
It was my pleasure taking care of you today!  ° °You were seen in the Emergency Department today for chest pain.  As we have discussed, today’s blood work and imaging are normal, but you may require further testing. ° °Please call your primary care physician to schedule a follow up appointment to discuss your ER visit today.  ° °Return to the Emergency Department if you experience any further chest pain/pressure/tightness, difficulty breathing, sudden sweating, or other symptoms that concern you. °

## 2017-10-20 ENCOUNTER — Encounter: Payer: Self-pay | Admitting: Family Medicine

## 2017-10-20 ENCOUNTER — Ambulatory Visit (INDEPENDENT_AMBULATORY_CARE_PROVIDER_SITE_OTHER): Payer: Self-pay | Admitting: Family Medicine

## 2017-10-20 VITALS — BP 110/60 | HR 90 | Temp 98.8°F | Resp 14 | Ht 64.0 in | Wt 138.8 lb

## 2017-10-20 DIAGNOSIS — F419 Anxiety disorder, unspecified: Secondary | ICD-10-CM

## 2017-10-20 DIAGNOSIS — G8929 Other chronic pain: Secondary | ICD-10-CM

## 2017-10-20 DIAGNOSIS — R109 Unspecified abdominal pain: Secondary | ICD-10-CM

## 2017-10-20 DIAGNOSIS — R0789 Other chest pain: Secondary | ICD-10-CM

## 2017-10-20 MED ORDER — BUSPIRONE HCL 10 MG PO TABS: 10.0000 mg | ORAL_TABLET | Freq: Three times a day (TID) | ORAL | 1 refills | Status: DC

## 2017-10-20 MED ORDER — FUROSEMIDE 20 MG PO TABS: 20.0000 mg | ORAL_TABLET | Freq: Every day | ORAL | 3 refills | Status: DC

## 2017-10-20 MED ORDER — MELOXICAM 15 MG PO TABS: 15.0000 mg | ORAL_TABLET | Freq: Every day | ORAL | 1 refills | Status: DC

## 2017-10-20 MED FILL — ?FUROSEMIDE 20 MG TABLET: 20 | 30 days supply | Qty: 30 | Fill #0

## 2017-10-20 MED FILL — busPIRone HCL 10 MG TABS: 10 | 30 days supply | Qty: 90 | Fill #0

## 2017-10-20 MED FILL — MELOXICAM 15 MG TABLET: 15 | 30 days supply | Qty: 30 | Fill #0

## 2017-10-20 NOTE — Progress Notes (Signed)
Patient ID: Brianna Velez, female    DOB: 1976-09-26, 41 y.o.   MRN: [id number]  PCP: Scot Jun, FNP  Chief Complaint  Patient presents with  . Follow-up    Subjective:  HPI Brianna Velez is a 41 y.o. female presents for follow-up. Brianna Velez has been seen in the emergency department 11 times within the last 6 months for various complaints. She established here at the [facility] in August and was evaluated for a 3 mm nodule left upper quadrant mesentery adjacent to the colon. She only has medicaid family planning and was provided a [place] Application to complete in order to have a payor source for gastroenterology referral. She has not completed the form nor did she return for her scheduled follow-up. Of recent, she has presented to the ED complaining of chest wall pain and back pain several times within the last 6 weeks. She has undergone a complete cardiac work-up which were all unremarkable for cardiac etiology. Brianna Velez has been treated with NSAIDS and reports that symptoms have not improved. Brianna Velez denies a prior diagnosis of anxiety and or depression. She endorses active stressors at home and with current employer. Reports associated anxiousness, persistent thoughts and worry which is interfering with sleep. She endorses chest pain occurs concurrently with anxiousness and stress. She has never been treated with SSRI or anti-anxiety medication. She denies thoughts of suicide or harming others.    Social History   Socioeconomic History  . Marital status: Married    Spouse name: Not on file  . Number of children: Not on file  . Years of education: Not on file  . Highest education level: Not on file  Social Needs  . Financial resource strain: Not on file  . Food insecurity - worry: Not on file  . Food insecurity - inability: Not on file  . Transportation needs - medical: Not on file  . Transportation needs - non-medical: Not on file  Occupational History   . Not on file  Tobacco Use  . Smoking status: Former Smoker    Last attempt to quit: 12/05/1996    Years since quitting: 20.8  . Smokeless tobacco: Never Used  Substance and Sexual Activity  . Alcohol use: No  . Drug use: No  . Sexual activity: Not on file  Other Topics Concern  . Not on file  Social History Narrative  . Not on file    Family History  Problem Relation Age of Onset  . Diabetes Sister   . Heart disease Paternal Grandmother   . Hypertension Father   . Dementia Mother   . Hypertension Mother    Review of Systems  Constitutional: Negative.   Respiratory: Positive for chest tightness and shortness of breath.   Cardiovascular: Positive for chest pain. Negative for palpitations and leg swelling.  Gastrointestinal: Positive for abdominal pain.  Genitourinary: Negative.   Musculoskeletal: Negative.   Skin: Negative.   Hematological: Negative.   Psychiatric/Behavioral: Positive for agitation, decreased concentration, dysphoric mood and sleep disturbance.    Patient Active Problem List   Diagnosis Date Noted  . Chronic pain syndrome 03/31/2016  . Screening for diabetes mellitus 12/31/2015  . S/P TAH (total abdominal hysterectomy) 09/29/2015  . Fibroid, uterine   . Menorrhagia with regular cycle   . Fibroid uterus 07/01/2015  . Left hip pain 11/06/2014    Allergies  Allergen Reactions  . Prednisone     Insomnia "it has me up all night"  Prior to Admission medications   Medication Sig Start Date End Date Taking? Authorizing Provider  Prenatal Vit-Fe Fumarate-FA (PRENATAL MULTIVITAMIN) TABS tablet Take 1 tablet by mouth daily at 12 noon.   Yes [provider]  cyclobenzaprine (FLEXERIL) 10 MG tablet Take 1 tablet (10 mg total) 2 (two) times daily as needed by mouth for muscle spasms. Patient not taking: Reported on 10/20/2017 10/18/17   Ward, Ozella Almond, PA-C  diclofenac (VOLTAREN) 75 MG EC tablet Take 1 tablet (75 mg total) by mouth 2 (two)  times daily. Patient not taking: Reported on 10/20/2017 10/04/17   Fransico Meadow, PA-C  diphenhydrAMINE (BENADRYL) 25 MG tablet Take 25 mg by mouth every 6 (six) hours as needed.    [provider]  furosemide (LASIX) 20 MG tablet Take 1 tablet (20 mg total) by mouth daily. Patient not taking: Reported on 10/20/2017 08/01/17   Scot Jun, FNP  naproxen (NAPROSYN) 500 MG tablet Take 500 mg every 4 (four) hours as needed by mouth for moderate pain.    [provider]  traMADol (ULTRAM) 50 MG tablet Take 1 tablet (50 mg total) by mouth every 6 (six) hours as needed. Patient not taking: Reported on 10/20/2017 09/24/17   Malvin Johns, MD    Past Medical, Surgical Family and Social History reviewed and updated.    Objective:   Today's Vitals   10/20/17 1026  BP: 110/60  Pulse: 90  Resp: 14  Temp: 98.8 F (37.1 C)  TempSrc: Oral  SpO2: 100%  Weight: 138 lb 12.8 oz (63 kg)  Height: 5\' 4"  (1.626 m)    Wt Readings from Last 3 Encounters:  10/20/17 138 lb 12.8 oz (63 kg)  10/12/17 142 lb (64.4 kg)  10/06/17 140 lb (63.5 kg)    Physical Exam  Constitutional: She is oriented to person, place, and time. She appears well-developed and well-nourished.  HENT:  Head: Normocephalic and atraumatic.  Right Ear: External ear normal.  Nose: Nose normal.  Mouth/Throat: Oropharynx is clear and moist. No oropharyngeal exudate.  Eyes: Conjunctivae and EOM are normal. Pupils are equal, round, and reactive to light. No scleral icterus.  Neck: Normal range of motion. No thyromegaly present.  Cardiovascular: Normal rate, regular rhythm, normal heart sounds and intact distal pulses.  Pulmonary/Chest: Effort normal and breath sounds normal.  Abdominal: Soft. Bowel sounds are normal. She exhibits no distension. There is no tenderness.  Musculoskeletal: Normal range of motion.  Lymphadenopathy:    She has no cervical adenopathy.  Neurological: She is alert and oriented to  person, place, and time.  Skin: Skin is dry.  Psychiatric: She has a normal mood and affect. Her behavior is normal. Judgment and thought content normal.   Assessment & Plan:  1. Anxiety disorder, unspecified type, suspect that ongoing physiological symptoms are induced by ongoing stress causing anxiety. Will trial patient on buspirone 10 mg, three times daily.   2. Chronic chest wall pain, likely resulting from persistent untreated anxiety. Will trial Meloxicam  15 mg once daily. Patient has undergone multiple EKG's, negative cardiac enzymes, CT angiograms, which were all negative. For now will defer cardiac work-up. If chest pain persist in spite of management of anxiety symptoms   3. Chronic abdominal pain, resolved today. Patient is in need of a gastroenterology follow-up for further evaluation of chronic abdominal pain and 3 mm nodule of upper left quadrant of the abdomen. encouraged intake of food prior to taking NSAID.  Brianna Velez was provided with another  [place] Financial Assistance Application    RTC: 1 month for evaluation of anxiety and chest wall pain.   Carroll Sage. Kenton Kingfisher, MSN, FNP-C [facility]  [address] [place] [place], [place] [address] [phone]
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
## 2017-11-09 ENCOUNTER — Telehealth: Payer: Self-pay | Admitting: Family Medicine

## 2017-11-09 NOTE — Telephone Encounter (Signed)
Complete office contact information and fax to number listed on documents. Notify patient that paperwork is completes.  Carroll Sage. Kenton Kingfisher, MSN, FNP-C [facility]  [address] [place], [place] [address] [phone]

## 2017-11-10 NOTE — Telephone Encounter (Signed)
Patient notified

## 2017-11-10 NOTE — Telephone Encounter (Signed)
Left a vm for patient to callback 

## 2017-11-17 ENCOUNTER — Encounter: Payer: Self-pay | Admitting: Family Medicine

## 2017-11-17 ENCOUNTER — Ambulatory Visit (INDEPENDENT_AMBULATORY_CARE_PROVIDER_SITE_OTHER): Payer: Self-pay | Admitting: Family Medicine

## 2017-11-17 VITALS — BP 124/77 | HR 89 | Temp 97.7°F | Resp 14 | Ht 64.0 in | Wt 144.0 lb

## 2017-11-17 DIAGNOSIS — F419 Anxiety disorder, unspecified: Secondary | ICD-10-CM

## 2017-11-17 DIAGNOSIS — M25552 Pain in left hip: Secondary | ICD-10-CM

## 2017-11-17 MED ORDER — BUSPIRONE HCL 10 MG PO TABS: 10.0000 mg | ORAL_TABLET | Freq: Three times a day (TID) | ORAL | 1 refills | Status: DC

## 2017-11-17 MED ORDER — KETOROLAC TROMETHAMINE 30 MG/ML IJ SOLN
30.0000 mg | Freq: Once | INTRAMUSCULAR | Status: AC
  Administered 2017-11-17: 30 mg via INTRAMUSCULAR

## 2017-11-17 MED ORDER — MELOXICAM 15 MG PO TABS: 15.0000 mg | ORAL_TABLET | Freq: Every day | ORAL | 1 refills | Status: DC

## 2017-11-17 MED FILL — busPIRone HCL 10 MG TABS: 10 | 30 days supply | Qty: 90 | Fill #0

## 2017-11-17 MED FILL — MELOXICAM 15 MG TABLET: 15 | 30 days supply | Qty: 30 | Fill #0

## 2017-11-17 NOTE — Patient Instructions (Signed)
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
## 2017-11-18 ENCOUNTER — Other Ambulatory Visit: Payer: Self-pay

## 2017-11-18 ENCOUNTER — Emergency Department (HOSPITAL_COMMUNITY)
Admission: EM | Admit: 2017-11-18 | Discharge: 2017-11-18 | Disposition: A | Discharge status: Home / Self Care | Payer: Medicaid Other | Attending: Emergency Medicine | Admitting: Emergency Medicine

## 2017-11-18 ENCOUNTER — Encounter (HOSPITAL_COMMUNITY): Payer: Self-pay

## 2017-11-18 DIAGNOSIS — Z5321 Procedure and treatment not carried out due to patient leaving prior to being seen by health care provider: Secondary | ICD-10-CM | POA: Insufficient documentation

## 2017-11-18 DIAGNOSIS — M79605 Pain in left leg: Secondary | ICD-10-CM | POA: Insufficient documentation

## 2017-11-18 NOTE — ED Triage Notes (Signed)
Onset several days ago left upper leg and buttock pain.  Pt seen PCP yesterday, was given Toradol with no relief.

## 2017-11-18 NOTE — ED Notes (Addendum)
Pt came to desk saying that she had to leave to pick someone up but would be back later; this tech encouraged pt to wait, as she has only 4 patients in front of her at this time; this tech explained to pt that she would have to start the process over if she left; pt verbalized understanding, and said that she "has no choice".

## 2017-11-21 ENCOUNTER — Telehealth: Payer: Self-pay

## 2017-11-21 NOTE — Telephone Encounter (Signed)
Spoke with patient and advise her per Maudie Mercury that she needs to fill out the [place] assistance to be seen by a specialist or we can refer her and she can pay out of pocket. Patient states that she will come by office to get application and understands that Maudie Mercury will not be prescribing any pain medication

## 2017-11-24 ENCOUNTER — Encounter (HOSPITAL_COMMUNITY): Payer: Self-pay | Admitting: Emergency Medicine

## 2017-11-24 ENCOUNTER — Ambulatory Visit (HOSPITAL_COMMUNITY)
Admission: EM | Admit: 2017-11-24 | Discharge: 2017-11-24 | Disposition: A | Discharge status: Home / Self Care | Payer: Self-pay | Attending: Internal Medicine | Admitting: Internal Medicine

## 2017-11-24 DIAGNOSIS — M5432 Sciatica, left side: Secondary | ICD-10-CM

## 2017-11-24 MED ORDER — CYCLOBENZAPRINE HCL 10 MG PO TABS: 10.0000 mg | ORAL_TABLET | Freq: Two times a day (BID) | ORAL | 0 refills | Status: DC | PRN

## 2017-11-24 NOTE — Discharge Instructions (Signed)
Please take the meloxicam which was prescribed on 12/14, once a day. Take with food.  May take muscle relaxer for muscle spasms as needed over the next few days. May cause drowsiness. Please continue to follow with [facility] as well as orthopedics as needed for persistent pain.

## 2017-11-24 NOTE — ED Triage Notes (Signed)
PT C/O: LLE pain that increases w/activity and associated numbness/tingling   ONSET: 1 week   DENIES: inj/trauma  TAKING MEDS: Naproxen, ibup, acetaminophen   A&O x4... NAD... Ambulatory
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
## 2017-12-05 ENCOUNTER — Encounter (HOSPITAL_COMMUNITY): Payer: Self-pay | Admitting: *Deleted

## 2017-12-05 ENCOUNTER — Emergency Department (HOSPITAL_COMMUNITY)
Admission: EM | Admit: 2017-12-05 | Discharge: 2017-12-05 | Disposition: A | Discharge status: Home / Self Care | Payer: Medicaid Other | Attending: Emergency Medicine | Admitting: Emergency Medicine

## 2017-12-05 ENCOUNTER — Other Ambulatory Visit: Payer: Self-pay

## 2017-12-05 DIAGNOSIS — J358 Other chronic diseases of tonsils and adenoids: Secondary | ICD-10-CM

## 2017-12-05 DIAGNOSIS — J029 Acute pharyngitis, unspecified: Secondary | ICD-10-CM

## 2017-12-05 DIAGNOSIS — H9209 Otalgia, unspecified ear: Secondary | ICD-10-CM | POA: Insufficient documentation

## 2017-12-05 DIAGNOSIS — R070 Pain in throat: Secondary | ICD-10-CM | POA: Insufficient documentation

## 2017-12-05 DIAGNOSIS — Z87891 Personal history of nicotine dependence: Secondary | ICD-10-CM | POA: Insufficient documentation

## 2017-12-05 DIAGNOSIS — Z79899 Other long term (current) drug therapy: Secondary | ICD-10-CM | POA: Insufficient documentation

## 2017-12-05 LAB — RAPID STREP SCREEN (MED CTR MEBANE ONLY): Streptococcus, Group A Screen (Direct): NEGATIVE

## 2017-12-05 MED ORDER — IBUPROFEN 400 MG PO TABS
600.0000 mg | ORAL_TABLET | Freq: Once | ORAL | Status: AC
  Administered 2017-12-05: 600 mg via ORAL
  Filled 2017-12-05: qty 1

## 2017-12-05 NOTE — ED Provider Notes (Signed)
[facility] DEPARTMENT Provider Note   CSN: [id number] Arrival date & time: 12/05/17  1740     History   Chief Complaint Chief Complaint  Patient presents with  . Sore Throat    HPI Brianna Velez is a 42 y.o. female who presents today for evaluation of sore throat since yesterday, along with earache.  She denies any other symptoms.  She has not taken any medicines prior to arrival.  She reports that she did attempt to gargle with salt water, however stated that it burned and did not help.  No nasal congestion, no fever/chills.  HPI  Past Medical History:  Diagnosis Date  . Fibroids   . History of hiatal hernia   . Leg pain   . Migraine   . Sciatica     Patient Active Problem List   Diagnosis Date Noted  . Chronic pain syndrome 03/31/2016  . Screening for diabetes mellitus 12/31/2015  . S/P TAH (total abdominal hysterectomy) 09/29/2015  . Fibroid, uterine   . Menorrhagia with regular cycle   . Fibroid uterus 07/01/2015  . Left hip pain 11/06/2014    Past Surgical History:  Procedure Laterality Date  . ABDOMINAL HYSTERECTOMY  09/29/2015   Procedure: HYSTERECTOMY ABDOMINAL;  Surgeon: Mora Bellman, MD;  Location: [facility];  Service: Gynecology;;  . BILATERAL SALPINGECTOMY Bilateral 09/29/2015   Procedure: BILATERAL SALPINGECTOMY;  Surgeon: Mora Bellman, MD;  Location: [facility];  Service: Gynecology;  Laterality: Bilateral;  . TUBAL LIGATION    . WISDOM TOOTH EXTRACTION      OB History    Gravida Para Term Preterm AB Living   2 2 2  0 0 2   SAB TAB Ectopic Multiple Live Births   0 0 0 0         Home Medications    Prior to Admission medications   Medication Sig Start Date End Date Taking? Authorizing Provider  busPIRone (BUSPAR) 10 MG tablet Take 1 tablet (10 mg total) by mouth 3 (three) times daily. 11/17/17   Scot Jun, FNP  cyclobenzaprine (FLEXERIL) 10 MG tablet Take 1 tablet (10 mg total) by mouth 2 (two) times daily  as needed for muscle spasms. 11/24/17   Zigmund Gottron, NP  diclofenac (VOLTAREN) 75 MG EC tablet Take 1 tablet (75 mg total) by mouth 2 (two) times daily. 10/04/17   Fransico Meadow, PA-C  diphenhydrAMINE (BENADRYL) 25 MG tablet Take 25 mg by mouth every 6 (six) hours as needed.    [provider]  furosemide (LASIX) 20 MG tablet Take 1 tablet (20 mg total) daily by mouth. 10/20/17   Scot Jun, FNP  meloxicam (MOBIC) 15 MG tablet Take 1 tablet (15 mg total) by mouth daily. 11/17/17   Scot Jun, FNP  Prenatal Vit-Fe Fumarate-FA (PRENATAL MULTIVITAMIN) TABS tablet Take 1 tablet by mouth daily at 12 noon.    [provider]  traMADol (ULTRAM) 50 MG tablet Take 1 tablet (50 mg total) by mouth every 6 (six) hours as needed. 09/24/17   Malvin Johns, MD    Family History Family History  Problem Relation Age of Onset  . Diabetes Sister   . Heart disease Paternal Grandmother   . Hypertension Father   . Dementia Mother   . Hypertension Mother     Social History Social History   Tobacco Use  . Smoking status: Former Smoker    Last attempt to quit: 12/05/1996    Years since quitting: 21.0  .  Smokeless tobacco: Never Used  Substance Use Topics  . Alcohol use: No  . Drug use: No     Allergies   Prednisone and Hydrocodone   Review of Systems Review of Systems  Constitutional: Negative for chills and fever.  HENT: Positive for ear pain and sore throat. Negative for ear discharge, facial swelling, trouble swallowing and voice change.   Eyes: Negative for visual disturbance.  Neurological: Negative for headaches.     Physical Exam Updated Vital Signs BP 109/67 (BP Location: Left Arm)   Pulse 85   Temp 99 F (37.2 C) (Oral)   Resp 16   Ht 5\' 4"  (1.626 m)   Wt 65.8 kg (145 lb)   LMP 08/14/2016   SpO2 100%   BMI 24.89 kg/m   Physical Exam  Constitutional: She appears well-developed and well-nourished.  Non-toxic appearance.  HENT:    Head: Normocephalic and atraumatic.  Right Ear: Hearing, tympanic membrane and ear canal normal. No middle ear effusion.  Left Ear: Hearing, tympanic membrane and ear canal normal.  No middle ear effusion.  Mouth/Throat: Uvula is midline, oropharynx is clear and moist and mucous membranes are normal. No uvula swelling. No posterior oropharyngeal edema, posterior oropharyngeal erythema or tonsillar abscesses. Tonsils are 1+ on the right. Tonsils are 1+ on the left. No tonsillar exudate.  On left tonsil there is a tonsil stone with surrounding area of slight erythema.  No unilateral edema.    Pulmonary/Chest: Effort normal. No respiratory distress.  Nursing note and vitals reviewed.    ED Treatments / Results  Labs (all labs ordered are listed, but only abnormal results are displayed) Labs Reviewed  RAPID STREP SCREEN (NOT AT [facility])  CULTURE, GROUP A STREP [facility])    EKG  EKG Interpretation None       Radiology No results found.  Procedures Procedures (including critical care time)  Medications Ordered in ED Medications - No data to display   Initial Impression / Assessment and Plan / ED Course  I have reviewed the triage vital signs and the nursing notes.  Pertinent labs & imaging results that were available during my care of the patient were reviewed by me and considered in my medical decision making (see chart for details).    Brianna Velez presents today for evaluation of sore throat and ear pain.  TMs bilaterally are pearly gray, no obvious infection.  Throat pain mild erythema, left-sided area consistent with tonsil stone.  Patient instructed on supportive care, given return precautions and states her understanding. PCP follow up.   Final Clinical Impressions(s) / ED Diagnoses   Final diagnoses:  Sore throat  Tonsil stone    ED Discharge Orders    None       Ollen Gross 12/05/17 2340    Fatima Blank, MD 12/09/17 [phone]

## 2017-12-05 NOTE — ED Triage Notes (Signed)
Sore throat since yesterday  .  Earache also

## 2017-12-05 NOTE — Discharge Instructions (Signed)
You have a strep culture pending.  If it is positive and you need antibiotics you will be contacted.  It appears that you have what is called a tonsil stone in the tonsil on your left side.  If you develop any concerning symptoms please seek additional medical care and evaluation.  Please take Ibuprofen (Advil, motrin) and Tylenol (acetaminophen) to relieve your pain.  You may take up to 600 MG (3 pills) of normal strength ibuprofen every 8 hours as needed.  In between doses of ibuprofen you make take tylenol, up to 1,000 mg (two extra strength pills).  Do not take more than 3,000 mg tylenol in a 24 hour period.  Please check all medication labels as many medications such as pain and cold medications may contain tylenol.  Do not drink alcohol while taking these medications.  Do not take other NSAID'S while taking ibuprofen (such as aleve or naproxen).  Please take ibuprofen with food to decrease stomach upset. Do not take ibuprofen, or other NSAIDS while taking mobic (meloxicam).

## 2017-12-08 LAB — CULTURE, GROUP A STREP (THRC)

## 2017-12-26 MED FILL — busPIRone HCL 10 MG TABS: 10 | 30 days supply | Qty: 90 | Fill #1

## 2018-01-15 IMAGING — US US TRANSVAGINAL NON-OB
1 series · 14 of 25 positions shown · non-contrast
Comparison: None

CLINICAL DATA: Abdominal distension.



[Series 1: us transvaginal non-ob · 0.22mm/px · 14 of 26 slices shown]
[im 1/26]
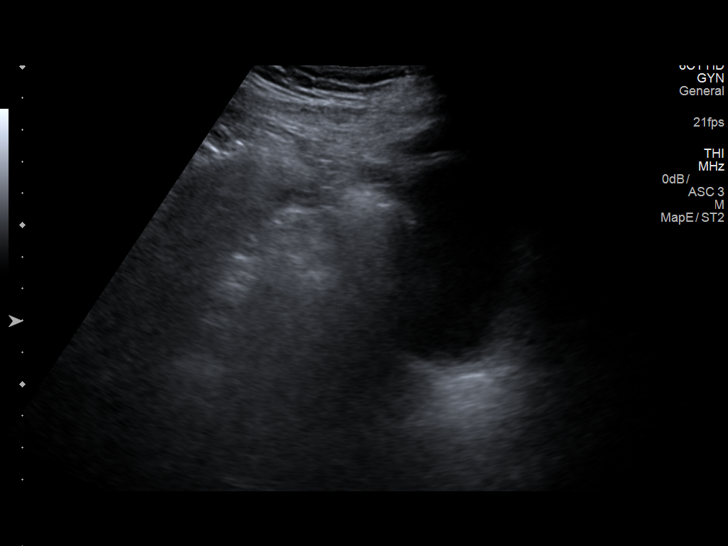
[im 3/26]
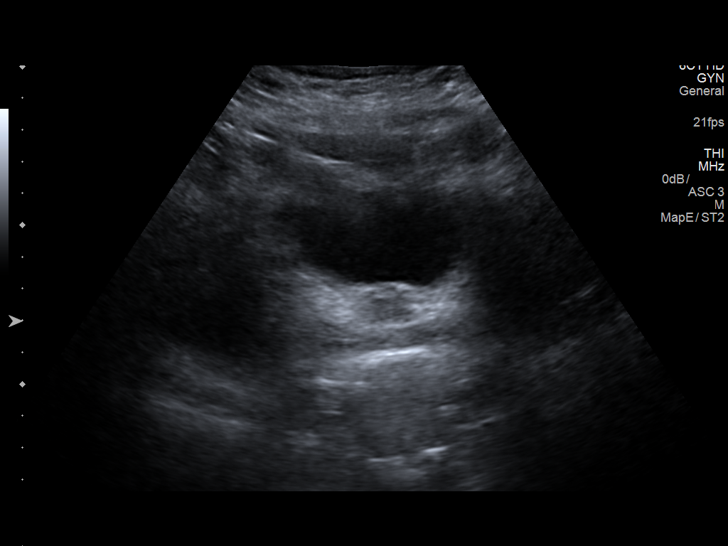
[im 5/26]
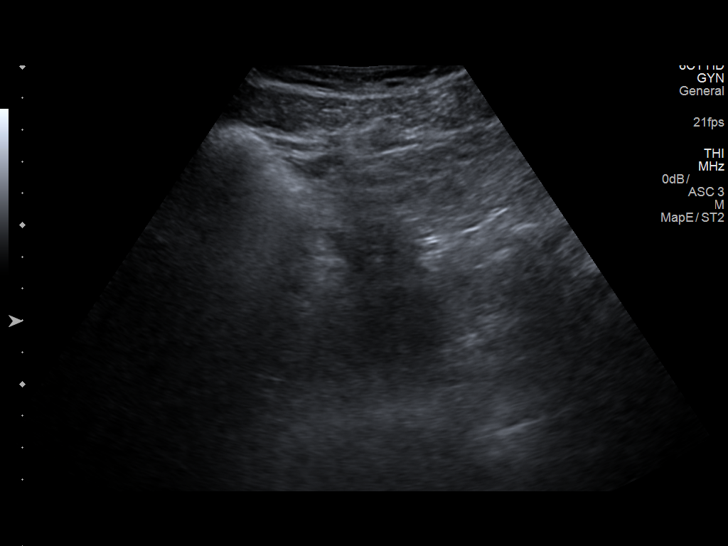
[im 7/26]
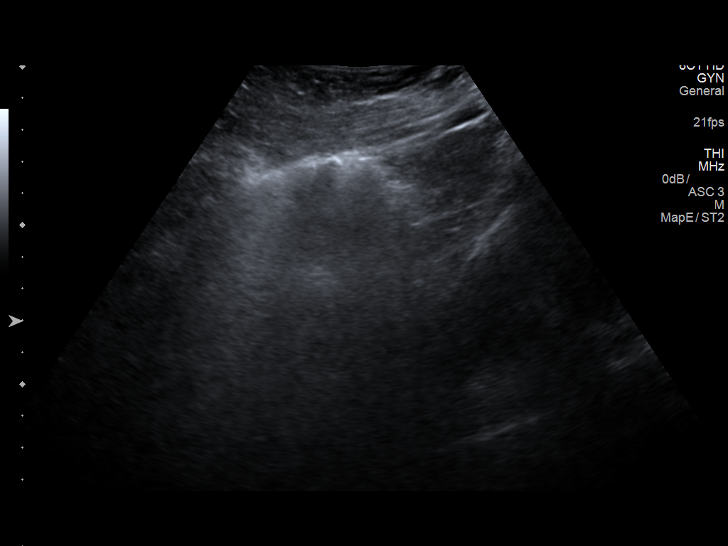
[im 9/26]
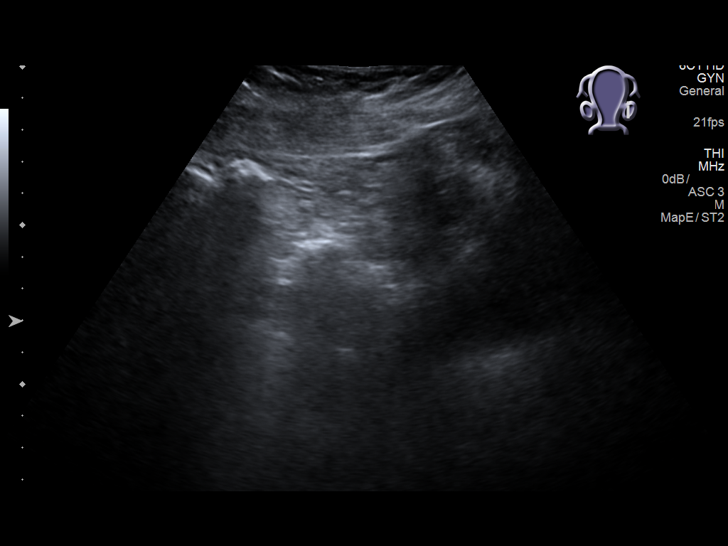
[im 10/26]
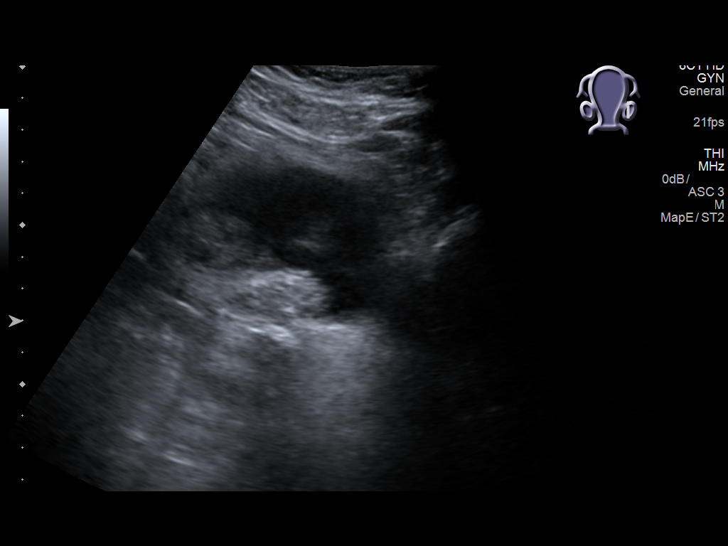
[im 12/26]
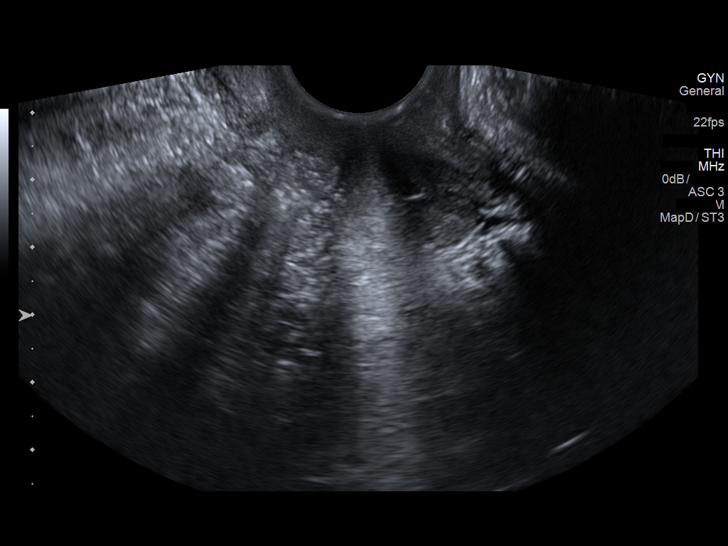
[im 14/26]
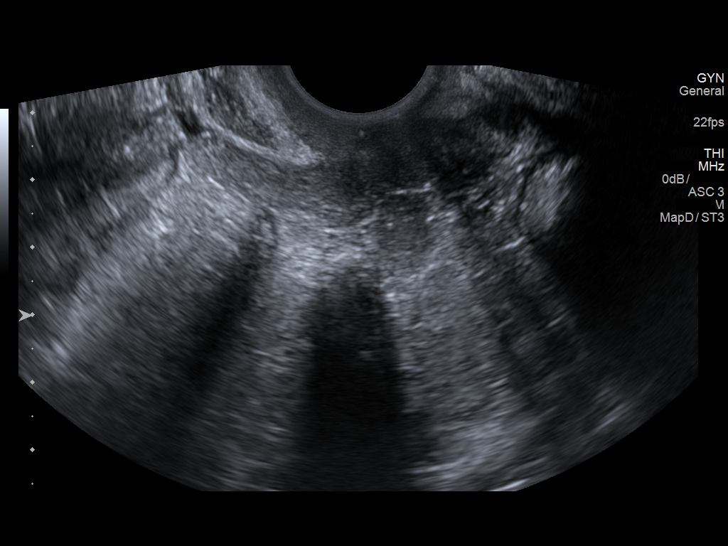
[im 16/26]
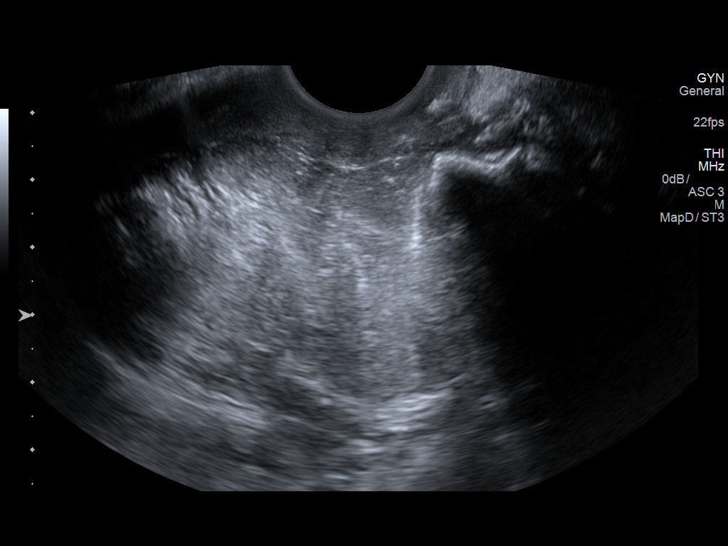
[im 17/26]
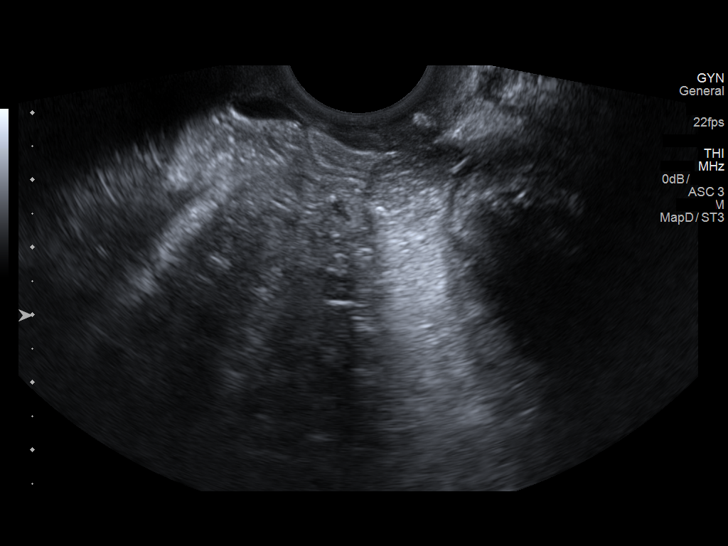
[im 19/26]
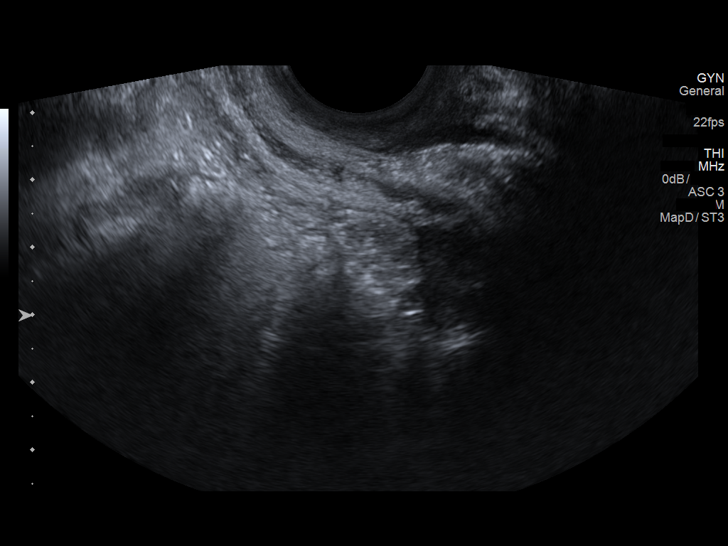
[im 21/26]
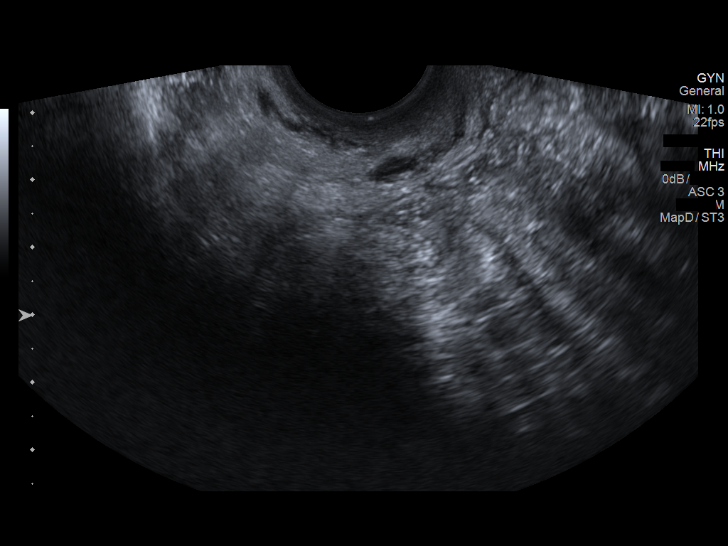
[im 23/26]
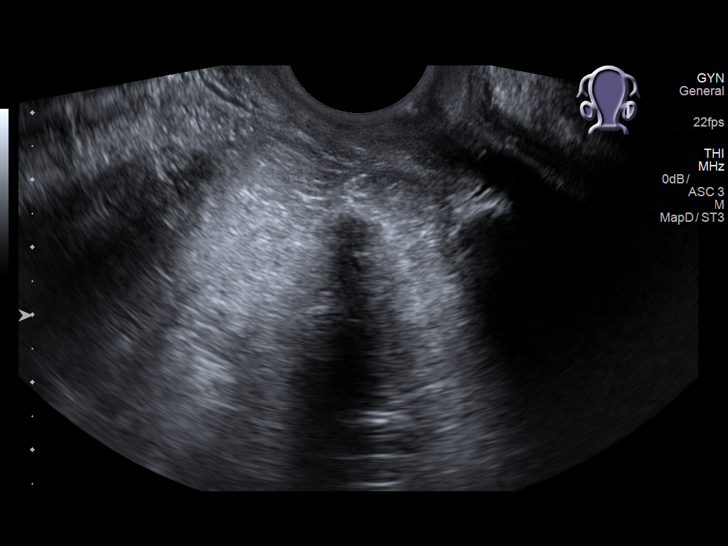
[im 26/26]
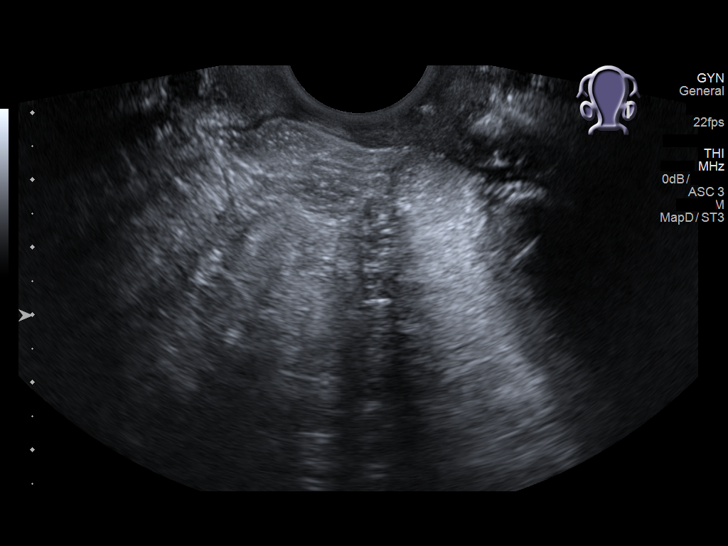

[14 of 25 positions shown; findings below may reference images not displayed]

FINDINGS: Uterus

Surgically absent.

Right ovary

Prior oophorectomy.

Left ovary

Right oophorectomy.

Other findings

No abnormal free fluid.
IMPRESSION: Prior hysterectomy.  No adnexal mass.  No pelvic free fluid.

## 2018-01-25 ENCOUNTER — Telehealth: Payer: Self-pay

## 2018-01-25 MED ORDER — BUSPIRONE HCL 10 MG PO TABS: 10.0000 mg | ORAL_TABLET | Freq: Three times a day (TID) | ORAL | 1 refills | Status: DC

## 2018-01-25 NOTE — Telephone Encounter (Signed)
buspar refilled and sent [facility]

## 2018-01-26 MED FILL — busPIRone HCL 10 MG TABS: 10 | 30 days supply | Qty: 90 | Fill #0

## 2018-02-13 ENCOUNTER — Telehealth: Payer: Self-pay

## 2018-02-13 ENCOUNTER — Other Ambulatory Visit: Payer: Self-pay | Admitting: Family Medicine

## 2018-02-13 MED ORDER — MELOXICAM 15 MG PO TABS: 15.0000 mg | ORAL_TABLET | Freq: Every day | ORAL | 2 refills | Status: DC

## 2018-02-13 MED FILL — MELOXICAM 15 MG TABLET: 15 | 30 days supply | Qty: 30 | Fill #0

## 2018-02-13 NOTE — Telephone Encounter (Signed)
Patient agrees with plan

## 2018-02-13 NOTE — Telephone Encounter (Signed)
I will refill the Meloxicam I prescribed to her previously for leg pain. I will not start a new medication until she is seen in office

## 2018-02-13 NOTE — Telephone Encounter (Signed)
Patient is requesting Gabapentin that was not prescribed by you. Patient has a appointment this Monday and wants to know what she can take for leg pain until her appointment on Monday.

## 2018-02-14 ENCOUNTER — Ambulatory Visit: Payer: Medicaid Other | Attending: Family Medicine

## 2018-02-16 ENCOUNTER — Ambulatory Visit: Payer: Medicaid Other | Admitting: Family Medicine

## 2018-02-19 ENCOUNTER — Other Ambulatory Visit: Payer: Self-pay

## 2018-02-19 ENCOUNTER — Ambulatory Visit (INDEPENDENT_AMBULATORY_CARE_PROVIDER_SITE_OTHER): Payer: Medicaid Other | Admitting: Family Medicine

## 2018-02-19 ENCOUNTER — Encounter: Payer: Self-pay | Admitting: Family Medicine

## 2018-02-19 VITALS — BP 107/71 | HR 79 | Temp 98.2°F | Ht 64.0 in | Wt 155.0 lb

## 2018-02-19 DIAGNOSIS — Z79899 Other long term (current) drug therapy: Secondary | ICD-10-CM

## 2018-02-19 DIAGNOSIS — M79604 Pain in right leg: Secondary | ICD-10-CM

## 2018-02-19 DIAGNOSIS — M792 Neuralgia and neuritis, unspecified: Secondary | ICD-10-CM

## 2018-02-19 DIAGNOSIS — G8929 Other chronic pain: Secondary | ICD-10-CM

## 2018-02-19 DIAGNOSIS — Z5181 Encounter for therapeutic drug level monitoring: Secondary | ICD-10-CM | POA: Diagnosis not present

## 2018-02-19 DIAGNOSIS — M79605 Pain in left leg: Secondary | ICD-10-CM | POA: Diagnosis not present

## 2018-02-19 MED ORDER — GABAPENTIN 300 MG PO CAPS: 300.0000 mg | ORAL_CAPSULE | Freq: Three times a day (TID) | ORAL | 3 refills | Status: DC

## 2018-02-19 MED ORDER — FUROSEMIDE 20 MG PO TABS: 20.0000 mg | ORAL_TABLET | Freq: Every day | ORAL | 3 refills | Status: DC | PRN

## 2018-02-19 NOTE — Patient Instructions (Signed)
-Start Gabapentin 300 mg 3 times daily as needed.  -Continue Meloxicam 15 mg once daily. -You are being referred to orthopedic surgery for further evaluation of symptoms.      Sciatica Sciatica is pain, numbness, weakness, or tingling along your sciatic nerve. The sciatic nerve starts in the lower back and goes down the back of each leg. Sciatica happens when this nerve is pinched or has pressure put on it. Sciatica usually goes away on its own or with treatment. Sometimes, sciatica may keep coming back (recur). Follow these instructions at home: Medicines  Take over-the-counter and prescription medicines only as told by your doctor.  Do not drive or use heavy machinery while taking prescription pain medicine. Managing pain  If directed, put ice on the affected area. ? Put ice in a plastic bag. ? Place a towel between your skin and the bag. ? Leave the ice on for 20 minutes, 2-3 times a day.  After icing, apply heat to the affected area before you exercise or as often as told by your doctor. Use the heat source that your doctor tells you to use, such as a moist heat pack or a heating pad. ? Place a towel between your skin and the heat source. ? Leave the heat on for 20-30 minutes. ? Remove the heat if your skin turns bright red. This is especially important if you are unable to feel pain, heat, or cold. You may have a greater risk of getting burned. Activity  Return to your normal activities as told by your doctor. Ask your doctor what activities are safe for you. ? Avoid activities that make your sciatica worse.  Take short rests during the day. Rest in a lying or standing position. This is usually better than sitting to rest. ? When you rest for a long time, do some physical activity or stretching between periods of rest. ? Avoid sitting for a long time without moving. Get up and move around at least one time each hour.  Exercise and stretch regularly, as told by your  doctor.  Do not lift anything that is heavier than 10 lb (4.5 kg) while you have symptoms of sciatica. ? Avoid lifting heavy things even when you do not have symptoms. ? Avoid lifting heavy things over and over.  When you lift objects, always lift in a way that is safe for your body. To do this, you should: ? Bend your knees. ? Keep the object close to your body. ? Avoid twisting. General instructions  Use good posture. ? Avoid leaning forward when you are sitting. ? Avoid hunching over when you are standing.  Stay at a healthy weight.  Wear comfortable shoes that support your feet. Avoid wearing high heels.  Avoid sleeping on a mattress that is too soft or too hard. You might have less pain if you sleep on a mattress that is firm enough to support your back.  Keep all follow-up visits as told by your doctor. This is important. Contact a doctor if:  You have pain that: ? Wakes you up when you are sleeping. ? Gets worse when you lie down. ? Is worse than the pain you have had in the past. ? Lasts longer than 4 weeks.  You lose weight for without trying. Get help right away if:  You cannot control when you pee (urinate) or poop (have a bowel movement).  You have weakness in any of these areas and it gets worse. ? Lower back. ?  Lower belly (pelvis). ? Butt (buttocks). ? Legs.  You have redness or swelling of your back.  You have a burning feeling when you pee. This information is not intended to replace advice given to you by your health care provider. Make sure you discuss any questions you have with your health care provider. Document Released: 08/30/2008 Document Revised: 04/28/2016 Document Reviewed: 07/31/2015 Elsevier Interactive Patient Education  2018 [employer]vier Inc[employer]  

## 2018-02-19 NOTE — Progress Notes (Signed)
Patient ID: Dabria Wadas, female    DOB: September 15, 1976, 42 y.o.   MRN: [id number]  PCP: Scot Jun, FNP  Chief Complaint  Patient presents with  . generalized swelling    using fluid pill no improvement    Subjective:  HPI Alyza Artiaga is a 42 y.o. female with a history of chronic lower extremity pain and anxiety presents for evaluation of  persistent lower extremity pain and swelling of lower extremities. This is not a new problem. Patient previously did not have health insurance and therefore was originally unable to be referred to orthopedics for further evaluation. She has complained initially of left leg pain with sciatica and today complains of leg pain in both with a burning sensation. She also feels that her "left leg is going to give out". Meloxicam was previously prescribed and she is reporting that medication is not effectively relieving pain.  Social History   Socioeconomic History  . Marital status: Married    Spouse name: Not on file  . Number of children: Not on file  . Years of education: Not on file  . Highest education level: Not on file  Social Needs  . Financial resource strain: Not on file  . Food insecurity - worry: Not on file  . Food insecurity - inability: Not on file  . Transportation needs - medical: Not on file  . Transportation needs - non-medical: Not on file  Occupational History  . Not on file  Tobacco Use  . Smoking status: Former Smoker    Last attempt to quit: 12/05/1996    Years since quitting: 21.2  . Smokeless tobacco: Never Used  Substance and Sexual Activity  . Alcohol use: No  . Drug use: No  . Sexual activity: Not on file  Other Topics Concern  . Not on file  Social History Narrative  . Not on file    Family History  Problem Relation Age of Onset  . Diabetes Sister   . Heart disease Paternal Grandmother   . Hypertension Father   . Dementia Mother   . Hypertension Mother    Review of Systems Pertinent negatives list  in HPI  Patient Active Problem List   Diagnosis Date Noted  . Chronic pain syndrome 03/31/2016  . Screening for diabetes mellitus 12/31/2015  . S/P TAH (total abdominal hysterectomy) 09/29/2015  . Fibroid, uterine   . Menorrhagia with regular cycle   . Fibroid uterus 07/01/2015  . Left hip pain 11/06/2014    Allergies  Allergen Reactions  . Prednisone     Insomnia "it has me up all night"  . Hydrocodone Rash    Prior to Admission medications   Medication Sig Start Date End Date Taking? Authorizing Provider  busPIRone (BUSPAR) 10 MG tablet Take 1 tablet (10 mg total) by mouth 3 (three) times daily. 01/25/18  Yes Scot Jun, FNP  diphenhydrAMINE (BENADRYL) 25 MG tablet Take 25 mg by mouth every 6 (six) hours as needed.   Yes [provider]  furosemide (LASIX) 20 MG tablet Take 1 tablet (20 mg total) daily by mouth. 10/20/17  Yes Scot Jun, FNP  meloxicam (MOBIC) 15 MG tablet Take 1 tablet (15 mg total) by mouth daily. 02/13/18  Yes Scot Jun, FNP  Prenatal Vit-Fe Fumarate-FA (PRENATAL MULTIVITAMIN) TABS tablet Take 1 tablet by mouth daily at 12 noon.   Yes [provider]  cyclobenzaprine (FLEXERIL) 10 MG tablet Take 1 tablet (10 mg total) by mouth 2 (two)  times daily as needed for muscle spasms. Patient not taking: Reported on 02/19/2018 11/24/17   Augusto Gamble B, NP  diclofenac (VOLTAREN) 75 MG EC tablet Take 1 tablet (75 mg total) by mouth 2 (two) times daily. Patient not taking: Reported on 02/19/2018 10/04/17   Fransico Meadow, PA-C  traMADol (ULTRAM) 50 MG tablet Take 1 tablet (50 mg total) by mouth every 6 (six) hours as needed. Patient not taking: Reported on 02/19/2018 09/24/17   Malvin Johns, MD    Past Medical, Surgical Family and Social History reviewed and updated.    Objective:   Today's Vitals   02/19/18 0837  BP: 107/71  Pulse: 79  Temp: 98.2 F (36.8 C)  TempSrc: Oral  SpO2: 98%  Weight: 155 lb (70.3 kg)   Height: 5\' 4"  (1.626 m)  PainSc: 0-No pain    Wt Readings from Last 3 Encounters:  02/19/18 155 lb (70.3 kg)  12/05/17 145 lb (65.8 kg)  11/18/17 145 lb (65.8 kg)   Physical Exam  Constitutional: She is oriented to person, place, and time. She appears well-developed and well-nourished.  HENT:  Head: Normocephalic and atraumatic.  Eyes: Conjunctivae and EOM are normal. Pupils are equal, round, and reactive to light.  Neck: Normal range of motion. Neck supple.  Cardiovascular: Normal rate, regular rhythm, normal heart sounds and intact distal pulses.  Pulmonary/Chest: Effort normal and breath sounds normal.  Musculoskeletal: Normal range of motion. She exhibits no edema.  Neurological: She is alert and oriented to person, place, and time.  Psychiatric: She has a normal mood and affect. Her behavior is normal. Judgment and thought content normal.   Assessment & Plan:  1. Chronic pain of both lower extremities, trial and failed NSAID therapy. Prior imaging of lumbar spine was negative. Will refer to Orthopedic Surgery.  2. Neuropathic pain, chronic on-going problem in lower legs. Will refer to orthopedic surgery for further evaluation of symptoms. For now will trial Gabapentin 300 mg up to 3 times daily and Meloxicam 15 mg once daily for pain symptoms. - Ambulatory referral to Orthopedic Surgery and will check potassium level as patient has been on chronically taken Furosamide.  3. Encounter for monitoring diuretic therapy, check potassium level.  4. Edema, none present today. Will reduce furosemide 20 mg once daily as needed.    Meds ordered this encounter  Medications  . gabapentin (NEURONTIN) 300 MG capsule    Sig: Take 1 capsule (300 mg total) by mouth 3 (three) times daily.    Dispense:  90 capsule    Refill:  3    Order Specific Question:   Supervising Provider    Answer:   Tresa Garter [id number]  . furosemide (LASIX) 20 MG tablet    Sig: Take 1 tablet (20 mg  total) by mouth daily as needed.    Dispense:  30 tablet    Refill:  3    Order Specific Question:   Supervising Provider    Answer:   Tresa Garter [id number]   Orders Placed This Encounter  Procedures  . Potassium  . Ambulatory referral to Orthopedic Surgery    RTC: As needed.   Carroll Sage. Kenton Kingfisher, MSN, FNP-C [facility]  [address] [place], [place] [address] [phone]

## 2018-02-20 LAB — POTASSIUM: Potassium: 4.5 mmol/L (ref 3.5–5.2)

## 2018-02-23 ENCOUNTER — Ambulatory Visit (INDEPENDENT_AMBULATORY_CARE_PROVIDER_SITE_OTHER): Payer: Medicaid Other | Admitting: Orthopaedic Surgery

## 2018-02-23 ENCOUNTER — Ambulatory Visit (INDEPENDENT_AMBULATORY_CARE_PROVIDER_SITE_OTHER): Payer: Medicaid Other

## 2018-02-23 ENCOUNTER — Other Ambulatory Visit (INDEPENDENT_AMBULATORY_CARE_PROVIDER_SITE_OTHER): Payer: Self-pay | Admitting: Radiology

## 2018-02-23 ENCOUNTER — Encounter (INDEPENDENT_AMBULATORY_CARE_PROVIDER_SITE_OTHER): Payer: Self-pay | Admitting: Orthopaedic Surgery

## 2018-02-23 ENCOUNTER — Other Ambulatory Visit (INDEPENDENT_AMBULATORY_CARE_PROVIDER_SITE_OTHER): Payer: Self-pay | Admitting: *Deleted

## 2018-02-23 VITALS — BP 115/76 | HR 98 | Resp 18 | Ht 64.0 in | Wt 155.0 lb

## 2018-02-23 DIAGNOSIS — M25552 Pain in left hip: Secondary | ICD-10-CM

## 2018-02-23 DIAGNOSIS — G8929 Other chronic pain: Secondary | ICD-10-CM

## 2018-02-23 DIAGNOSIS — M25562 Pain in left knee: Secondary | ICD-10-CM

## 2018-02-23 DIAGNOSIS — M5442 Lumbago with sciatica, left side: Secondary | ICD-10-CM | POA: Diagnosis not present
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
## 2018-03-03 ENCOUNTER — Ambulatory Visit
Admission: RE | Admit: 2018-03-03 | Discharge: 2018-03-03 | Disposition: A | Discharge status: Home / Self Care | Payer: Medicaid Other | Source: Ambulatory Visit | Attending: Orthopaedic Surgery | Admitting: Orthopaedic Surgery

## 2018-03-03 DIAGNOSIS — M5442 Lumbago with sciatica, left side: Principal | ICD-10-CM

## 2018-03-03 DIAGNOSIS — G8929 Other chronic pain: Secondary | ICD-10-CM

## 2018-03-09 ENCOUNTER — Other Ambulatory Visit (INDEPENDENT_AMBULATORY_CARE_PROVIDER_SITE_OTHER): Payer: Self-pay | Admitting: Radiology

## 2018-03-09 ENCOUNTER — Encounter (INDEPENDENT_AMBULATORY_CARE_PROVIDER_SITE_OTHER): Payer: Self-pay | Admitting: Orthopaedic Surgery

## 2018-03-09 ENCOUNTER — Ambulatory Visit (INDEPENDENT_AMBULATORY_CARE_PROVIDER_SITE_OTHER): Payer: Medicaid Other | Admitting: Orthopaedic Surgery

## 2018-03-09 VITALS — BP 100/69 | HR 77 | Ht 64.0 in | Wt 155.0 lb

## 2018-03-09 DIAGNOSIS — G8929 Other chronic pain: Secondary | ICD-10-CM

## 2018-03-09 DIAGNOSIS — M545 Low back pain: Principal | ICD-10-CM

## 2018-03-09 DIAGNOSIS — M5442 Lumbago with sciatica, left side: Secondary | ICD-10-CM
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
## 2018-03-15 ENCOUNTER — Ambulatory Visit: Payer: Medicaid Other | Admitting: Physical Therapy

## 2018-04-10 MED FILL — MELOXICAM 15 MG TABLET: 15 | 30 days supply | Qty: 30 | Fill #1

## 2018-04-12 ENCOUNTER — Telehealth: Payer: Self-pay | Admitting: Family Medicine

## 2018-04-12 MED ORDER — BUSPIRONE HCL 15 MG PO TABS: 15.0000 mg | ORAL_TABLET | Freq: Two times a day (BID) | ORAL | 3 refills | Status: DC

## 2018-04-12 MED FILL — busPIRone HCL 15 MG TABS: 15 | 30 days supply | Qty: 60 | Fill #0

## 2018-04-12 NOTE — Telephone Encounter (Signed)
Pharmacy is out of Buspar 10 mg which patient takes TID. Updating prescription to Buspar 15 mg BID which is equivalent to current dose she is taking per day

## 2018-04-12 NOTE — Telephone Encounter (Signed)
-----   Message from Gabriel Earing, CPhT sent at 04/11/2018  4:21 PM EDT ----- We have been trying to contact the [facility] via phone to get a dosage change on a medication for Ceballos,Ingra, however, the phone lines are being transferred to a call center.   [facility] is looking for a refill on her Buspirone 10 MG tablets. We cannot get 10 MGs in from the manufacter. We need this changed to 15MG . She takes 30MG /day.

## 2018-04-13 ENCOUNTER — Encounter: Payer: Self-pay | Admitting: Family Medicine

## 2018-04-13 ENCOUNTER — Ambulatory Visit (INDEPENDENT_AMBULATORY_CARE_PROVIDER_SITE_OTHER): Payer: Medicaid Other | Admitting: Family Medicine

## 2018-04-13 VITALS — BP 116/68 | HR 90 | Temp 98.0°F | Ht 64.0 in | Wt 155.2 lb

## 2018-04-13 DIAGNOSIS — R109 Unspecified abdominal pain: Secondary | ICD-10-CM

## 2018-04-13 DIAGNOSIS — R935 Abnormal findings on diagnostic imaging of other abdominal regions, including retroperitoneum: Secondary | ICD-10-CM | POA: Diagnosis not present

## 2018-04-13 DIAGNOSIS — R140 Abdominal distension (gaseous): Secondary | ICD-10-CM

## 2018-04-13 DIAGNOSIS — G8929 Other chronic pain: Secondary | ICD-10-CM | POA: Diagnosis not present

## 2018-04-13 DIAGNOSIS — M25561 Pain in right knee: Secondary | ICD-10-CM | POA: Diagnosis not present

## 2018-04-13 DIAGNOSIS — M25562 Pain in left knee: Secondary | ICD-10-CM

## 2018-04-13 DIAGNOSIS — K6389 Other specified diseases of intestine: Secondary | ICD-10-CM

## 2018-04-13 DIAGNOSIS — K639 Disease of intestine, unspecified: Secondary | ICD-10-CM | POA: Diagnosis not present

## 2018-04-13 DIAGNOSIS — R6881 Early satiety: Secondary | ICD-10-CM | POA: Diagnosis not present

## 2018-04-13 LAB — POCT URINALYSIS DIP (MANUAL ENTRY)
Bilirubin, UA: NEGATIVE
Blood, UA: NEGATIVE
Glucose, UA: NEGATIVE mg/dL
Ketones, POC UA: NEGATIVE mg/dL
Leukocytes, UA: NEGATIVE
Nitrite, UA: NEGATIVE
Protein Ur, POC: NEGATIVE mg/dL
Spec Grav, UA: 1.02 (ref 1.010–1.025)
Urobilinogen, UA: 2 E.U./dL — AB
pH, UA: 7 (ref 5.0–8.0)

## 2018-04-13 MED ORDER — PANTOPRAZOLE SODIUM 20 MG PO TBEC: 20.0000 mg | DELAYED_RELEASE_TABLET | Freq: Every day | ORAL | 1 refills | Status: DC

## 2018-04-13 MED ORDER — BUSPIRONE HCL 15 MG PO TABS: 15.0000 mg | ORAL_TABLET | Freq: Two times a day (BID) | ORAL | 3 refills | Status: DC

## 2018-04-13 MED ORDER — GABAPENTIN 300 MG PO CAPS: 300.0000 mg | ORAL_CAPSULE | Freq: Three times a day (TID) | ORAL | 3 refills | Status: DC

## 2018-04-13 MED FILL — PANTOPRAZOLE SOD DR 20 MG T: 20 | 30 days supply | Qty: 30 | Fill #0

## 2018-04-13 MED FILL — GABAPENTIN 300 MG CAPSULE: 300 | 30 days supply | Qty: 90 | Fill #0

## 2018-04-13 NOTE — Progress Notes (Signed)
poct

## 2018-04-13 NOTE — Patient Instructions (Signed)
For ongoing abdominal discomfort and early satiety, will order a stat CT of the abdomen and pelvis.  We will follow-up by phone after reviewing results. We will also follow-up by phone with any abnormal laboratory results We will start a trial of pantoprazole 20 mg daily. You will warrant a referral to gastroenterology for further work-up and evaluation of abnormal CT scan

## 2018-04-13 NOTE — Progress Notes (Signed)
Subjective:    Patient ID: Brianna Velez, female    DOB: 31-Jul-1976, 42 y.o.   MRN: [id number]   Brianna Velez, a 41 year old female presents complaining of abdominal pain. Pain has been recurrent over the past year. She says that she was evaluated for this problem several months ago in the emergency department.  This has been associated with abdominal bloating, belching, deep pressure at base of neck, early satiety and midespigastric pain. Patient has a history of an abnormal CT of abdomen and pelvis that showed a 3 mm nodule to left upper quadrant mesentary. She denies bilious reflux, choking on food, cough, dysphagia, hematemesis, hoarseness, laryngitis and melena.. She denies dysphagia.  She has not lost weight. She denies melena, hematochezia, hematemesis, and coffee ground emesis.     Abdominal Pain  This is a recurrent problem. The current episode started more than 1 month ago. The problem occurs constantly. The problem has been gradually worsening. The pain is located in the epigastric region and periumbilical region. The pain is at a severity of 6/10. The quality of the pain is a sensation of fullness. Pertinent negatives include no anorexia, arthralgias, belching, constipation, diarrhea, dysuria, fever, flatus, frequency, headaches, hematochezia, hematuria, melena, myalgias, nausea, vomiting or weight loss. The pain is aggravated by belching and eating. Prior diagnostic workup includes CT scan. There is no history of abdominal surgery, colon cancer, Crohn's disease, gallstones, GERD, irritable bowel syndrome, pancreatitis, PUD or ulcerative colitis.     Past Medical History:  Diagnosis Date  . Fibroids   . History of hiatal hernia   . Leg pain   . Migraine   . Sciatica     Social History   Socioeconomic History  . Marital status: Married    Spouse name: Not on file  . Number of children: Not on file  . Years of education: Not on file  . Highest education level: Not on file   Occupational History  . Not on file  Social Needs  . Financial resource strain: Not on file  . Food insecurity:    Worry: Not on file    Inability: Not on file  . Transportation needs:    Medical: Not on file    Non-medical: Not on file  Tobacco Use  . Smoking status: Former Smoker    Last attempt to quit: 12/05/1996    Years since quitting: 21.3  . Smokeless tobacco: Never Used  Substance and Sexual Activity  . Alcohol use: No  . Drug use: No  . Sexual activity: Not on file  Lifestyle  . Physical activity:    Days per week: Not on file    Minutes per session: Not on file  . Stress: Not on file  Relationships  . Social connections:    Talks on phone: Not on file    Gets together: Not on file    Attends religious service: Not on file    Active member of club or organization: Not on file    Attends meetings of clubs or organizations: Not on file    Relationship status: Not on file  . Intimate partner violence:    Fear of current or ex partner: Not on file    Emotionally abused: Not on file    Physically abused: Not on file    Forced sexual activity: Not on file  Other Topics Concern  . Not on file  Social History Narrative  . Not on file   Immunization History  Administered  Date(s) Administered  . Influenza,inj,Quad PF,6+ Mos 08/01/2017   Allergies  Allergen Reactions  . Prednisone     Insomnia "it has me up all night"  . Hydrocodone Rash    Review of Systems  Constitutional: Positive for appetite change (early satiety). Negative for fatigue, fever and weight loss.  HENT: Negative.   Eyes: Negative for photophobia, redness and visual disturbance.  Respiratory: Negative.   Cardiovascular: Negative.   Gastrointestinal: Positive for abdominal distention and abdominal pain. Negative for anorexia, constipation, diarrhea, flatus, hematochezia, melena, nausea and vomiting.  Endocrine: Negative for polydipsia, polyphagia and polyuria.  Genitourinary: Negative.   Negative for dysuria, frequency and hematuria.  Musculoskeletal: Negative.  Negative for arthralgias and myalgias.  Skin: Negative.   Neurological: Negative.  Negative for headaches.  Hematological: Negative.   Psychiatric/Behavioral: Negative.        Objective:   Physical Exam  Constitutional: She is oriented to person, place, and time. She appears well-developed and well-nourished.  Eyes: Pupils are equal, round, and reactive to light.  Neck: Normal range of motion.  Cardiovascular: Normal rate, regular rhythm and normal heart sounds.  Pulmonary/Chest: Effort normal and breath sounds normal.  Abdominal: She exhibits distension. She exhibits no ascites and no mass. There is tenderness in the epigastric area and periumbilical area.  Musculoskeletal: Normal range of motion.  Neurological: She is alert and oriented to person, place, and time.  Skin: Skin is warm and dry.  Psychiatric: She has a normal mood and affect. Her behavior is normal. Judgment and thought content normal.         BP 116/68 (BP Location: Right Arm, Patient Position: Sitting, Cuff Size: Small)   Pulse 90   Temp 98 F (36.7 C) (Oral)   Ht 5\' 4"  (1.626 m)   Wt 155 lb 3.2 oz (70.4 kg)   LMP 08/14/2016   SpO2 100%   BMI 26.64 kg/m  Assessment & Plan:  1. Abdominal pain, unspecified abdominal location - POCT urinalysis dipstick - CT Abdomen Pelvis W Contrast; Future - Comprehensive metabolic panel - CBC with Differential - Amylase - Lipase - Urinalysis Dipstick  2. Early satiety - Helicobacter pylori abs-IgG+IgA, bld - CT Abdomen Pelvis W Contrast; Future - Comprehensive metabolic panel - CBC with Differential  3. Abdominal distension (gaseous) - Helicobacter pylori abs-IgG+IgA, bld - CT Abdomen Pelvis W Contrast; Future - pantoprazole (PROTONIX) 20 MG tablet; Take 1 tablet (20 mg total) by mouth daily.  Dispense: 30 tablet; Refill: 1  4. Mesenteric mass  - CT Abdomen Pelvis W Contrast;  Future  5. Abnormal CT of the abdomen FINDINGS: Lower chest: No acute abnormality.  Hepatobiliary: No focal liver abnormality is seen. No gallstones, gallbladder wall thickening, or biliary dilatation.  Pancreas: Unremarkable. No pancreatic ductal dilatation or surrounding inflammatory changes.  Spleen: Possible vague hypodense lesions within the spleen, versus heterogenous enhancement.  Adrenals/Urinary Tract: Adrenal glands are unremarkable. Kidneys are normal, without renal calculi, focal lesion, or hydronephrosis. Bladder is unremarkable.  Stomach/Bowel: Stomach is within normal limits. Appendix appears normal. No evidence of bowel wall thickening, distention, or inflammatory changes. Moderate stool burden in the colon.  Vascular/Lymphatic: No significant vascular findings are present. No enlarged abdominal or pelvic lymph nodes.  Reproductive: Status post hysterectomy. No adnexal masses.  Other: Tiny nodule in the left upper quadrant, series 3, image number 24. No free air or free fluid. Fat in the umbilicus.  Musculoskeletal: No acute or significant osseous findings.  IMPRESSION: 1. No definite CT evidence for  acute intra-abdominal or pelvic pathology. Moderate stool burden in the colon. 2. Possible vague hypodense splenic lesions versus heterogenous enhancement. Suggest non emergent MRI correlation for follow-up. 3. Tiny 3 mm nodule in the left upper quadrant mesentery adjacent to the colon, possibly a low lying tiny splenule. Short interval CT follow-up could be considered to document stability.  - CT Abdomen Pelvis W Contrast; Future  6. Chronic pain of both knees - gabapentin (NEURONTIN) 300 MG capsule; Take 1 capsule (300 mg total) by mouth 3 (three) times daily.  Dispense: 90 capsule; Refill: 3   RTC: Will follow up after reviewing scheduled imaging  Donia Pounds  MSN, FNP-C [facility] [address] [place], [place] [address] [phone]

## 2018-04-16 ENCOUNTER — Telehealth: Payer: Self-pay

## 2018-04-16 NOTE — Telephone Encounter (Signed)
-----   Message from Dorena Dew, [place] sent at 04/16/2018  4:51 AM EDT ----- Regarding: lab results Please inform patient that amylase is elevated, which is typically associated with pancreatitis. Please ensure that patient has CT of abdomen and pelvis scheduled.   Will follow up with treatment plan after reviewing results.   Donia Pounds  MSN, FNP-C [facility] [address] [place], [place] [address] [phone]

## 2018-04-16 NOTE — Telephone Encounter (Signed)
Called, no answer. Left a voicemail for patient to call back. CT is scheduled for 04/19/2018 @12 ;30pm.

## 2018-04-16 NOTE — Telephone Encounter (Signed)
Called and spoke with patient, advised that amylase is elevated and that this is associated with pancreatitis. Advised that we have scheduled a Ct for 04/19/2018 @12 :30pm for further evaluation. Patient verbalized understanding. Thanks!

## 2018-04-17 LAB — CBC WITH DIFFERENTIAL/PLATELET
Basophils Absolute: 0 10*3/uL (ref 0.0–0.2)
Basos: 0 %
EOS (ABSOLUTE): 0 10*3/uL (ref 0.0–0.4)
Eos: 1 %
Hematocrit: 37.2 % (ref 34.0–46.6)
Hemoglobin: 13.1 g/dL (ref 11.1–15.9)
Immature Grans (Abs): 0 10*3/uL (ref 0.0–0.1)
Immature Granulocytes: 0 %
Lymphocytes Absolute: 1.5 10*3/uL (ref 0.7–3.1)
Lymphs: 35 %
MCH: 32.8 pg (ref 26.6–33.0)
MCHC: 35.2 g/dL (ref 31.5–35.7)
MCV: 93 fL (ref 79–97)
Monocytes Absolute: 0.3 10*3/uL (ref 0.1–0.9)
Monocytes: 7 %
Neutrophils Absolute: 2.5 10*3/uL (ref 1.4–7.0)
Neutrophils: 57 %
Platelets: 219 10*3/uL (ref 150–379)
RBC: 3.99 x10E6/uL (ref 3.77–5.28)
RDW: 12.5 % (ref 12.3–15.4)
WBC: 4.3 10*3/uL (ref 3.4–10.8)

## 2018-04-17 LAB — COMPREHENSIVE METABOLIC PANEL
ALT: 11 IU/L (ref 0–32)
AST: 15 IU/L (ref 0–40)
Albumin/Globulin Ratio: 1.6 (ref 1.2–2.2)
Albumin: 4.4 g/dL (ref 3.5–5.5)
Alkaline Phosphatase: 52 IU/L (ref 39–117)
BUN/Creatinine Ratio: 17 (ref 9–23)
BUN: 13 mg/dL (ref 6–24)
Bilirubin Total: <0.2 mg/dL (ref 0.0–1.2)
CO2: 20 mmol/L (ref 20–29)
Calcium: 9.1 mg/dL (ref 8.7–10.2)
Chloride: 101 mmol/L (ref 96–106)
Creatinine, Ser: 0.76 mg/dL (ref 0.57–1.00)
GFR calc Af Amer: 111 mL/min/{1.73_m2} (ref >59)
GFR calc non Af Amer: 96 mL/min/{1.73_m2} (ref >59)
Globulin, Total: 2.7 g/dL (ref 1.5–4.5)
Glucose: 96 mg/dL (ref 65–99)
Potassium: 4.1 mmol/L (ref 3.5–5.2)
Sodium: 136 mmol/L (ref 134–144)
Total Protein: 7.1 g/dL (ref 6.0–8.5)

## 2018-04-17 LAB — HELICOBACTER PYLORI ABS-IGG+IGA, BLD
H. pylori, IgA Abs: <9 units (ref 0.0–8.9)
H. pylori, IgG AbS: <0.8 Index Value (ref 0.00–0.79)

## 2018-04-17 LAB — LIPASE: Lipase: 35 U/L (ref 14–72)

## 2018-04-17 LAB — AMYLASE: Amylase: 131 U/L — ABNORMAL HIGH (ref 31–124)

## 2018-04-19 ENCOUNTER — Ambulatory Visit (HOSPITAL_COMMUNITY): Payer: Medicaid Other

## 2018-05-01 ENCOUNTER — Telehealth: Payer: Self-pay

## 2018-05-01 NOTE — Telephone Encounter (Signed)
Patient states that Medicaid denied her for the CT because she had already had one done and would like to know what she should do know. Patient is aware that provider is out of office until tomorrow.

## 2018-05-07 ENCOUNTER — Other Ambulatory Visit: Payer: Self-pay | Admitting: Family Medicine

## 2018-05-07 DIAGNOSIS — R109 Unspecified abdominal pain: Principal | ICD-10-CM

## 2018-05-07 DIAGNOSIS — G8929 Other chronic pain: Secondary | ICD-10-CM

## 2018-05-07 NOTE — Telephone Encounter (Signed)
-----   Message from Dorena Dew, [place] sent at 05/07/2018  5:24 AM EDT ----- Please inform patient that a referral to gastroenterology will be placed for further work up and evaluation of chronic abdominal pain.  Give clear instructions to report to ER or return to medical center if symptoms do not improve, worsen or new problems develop.    Donia Pounds  MSN, FNP-C [facility] [address] [place], [place] [address] [phone]

## 2018-05-07 NOTE — Progress Notes (Signed)
Orders Placed This Encounter  Procedures  . Ambulatory referral to Gastroenterology    Referral Priority:   Routine    Referral Type:   Consultation    Referral Reason:   Specialty Services Required    Number of Visits Requested:   1    Allex Lapoint Moore Nicolena Schurman  MSN, FNP-C [facility] [address]  [place], [place] [address] [phone]  

## 2018-05-07 NOTE — Telephone Encounter (Signed)
Patient notified and will wait for referral  

## 2018-05-08 ENCOUNTER — Encounter: Payer: Self-pay | Admitting: Gastroenterology

## 2018-05-14 ENCOUNTER — Ambulatory Visit (INDEPENDENT_AMBULATORY_CARE_PROVIDER_SITE_OTHER): Payer: Medicaid Other | Admitting: Family Medicine

## 2018-05-14 ENCOUNTER — Encounter: Payer: Self-pay | Admitting: Family Medicine

## 2018-05-14 VITALS — BP 116/68 | HR 84 | Temp 98.0°F | Ht 64.0 in | Wt 158.0 lb

## 2018-05-14 DIAGNOSIS — R413 Other amnesia: Secondary | ICD-10-CM

## 2018-05-14 DIAGNOSIS — M25561 Pain in right knee: Secondary | ICD-10-CM | POA: Diagnosis not present

## 2018-05-14 DIAGNOSIS — G8929 Other chronic pain: Secondary | ICD-10-CM

## 2018-05-14 DIAGNOSIS — M25562 Pain in left knee: Secondary | ICD-10-CM | POA: Diagnosis not present

## 2018-05-14 DIAGNOSIS — F419 Anxiety disorder, unspecified: Secondary | ICD-10-CM | POA: Diagnosis not present

## 2018-05-14 DIAGNOSIS — Z09 Encounter for follow-up examination after completed treatment for conditions other than malignant neoplasm: Secondary | ICD-10-CM

## 2018-05-14 DIAGNOSIS — R140 Abdominal distension (gaseous): Secondary | ICD-10-CM

## 2018-05-14 MED ORDER — GABAPENTIN 300 MG PO CAPS: 300.0000 mg | ORAL_CAPSULE | Freq: Three times a day (TID) | ORAL | 3 refills | Status: DC

## 2018-05-14 NOTE — Progress Notes (Addendum)
Subjective:    Patient ID: Brianna Velez, female    DOB: [birth year], 42 y.o.   MRN: [id number]  PCP: Kathe Becton, NP  Chief Complaint  Patient presents with  . Follow-up    Abdominal pain   HPI  Brianna Velez has a history of Sickle Cell Anemia, Sciatica, Migraine, Fibroids, and Multiple Myeloma.  Current Status: She states that she continues to have abdominal pain and discomfort. She was not able qualify for Abdominal CT Scan, so she was referred to GI, who have not been able to reach her by phone. She states that she continues to have flatulence and nausea. She has a decreased appetite and eats only 1-2 times daily. She has been having chronic abdominal pain for about a year now. She has diarrhea and constipation off and on. She denies vomiting, blood in stools, dysuria, and hematuria.  She has been having mild anxiety lately.   She states that she has been very forgetful lately. She does report migraine headaches. Denies viisual changes, dizziness, and falls.  Denies chest pain, heart palpitations, cough, and shortness of breath.    She denies pain today.   Past Medical History:  Diagnosis Date  . Fibroids   . History of hiatal hernia   . Leg pain   . Migraine   . Sciatica     Family History  Problem Relation Age of Onset  . Diabetes Sister   . Heart disease Paternal Grandmother   . Hypertension Father   . Dementia Mother   . Hypertension Mother     Social History   Socioeconomic History  . Marital status: Married    Spouse name: Not on file  . Number of children: Not on file  . Years of education: Not on file  . Highest education level: Not on file  Occupational History  . Not on file  Social Needs  . Financial resource strain: Not on file  . Food insecurity:    Worry: Not on file    Inability: Not on file  . Transportation needs:    Medical: Not on file    Non-medical: Not on file  Tobacco Use  . Smoking status: Former Smoker    Last attempt to  quit: 12/05/1996    Years since quitting: 21.4  . Smokeless tobacco: Never Used  Substance and Sexual Activity  . Alcohol use: No  . Drug use: No  . Sexual activity: Not on file  Lifestyle  . Physical activity:    Days per week: Not on file    Minutes per session: Not on file  . Stress: Not on file  Relationships  . Social connections:    Talks on phone: Not on file    Gets together: Not on file    Attends religious service: Not on file    Active member of club or organization: Not on file    Attends meetings of clubs or organizations: Not on file    Relationship status: Not on file  . Intimate partner violence:    Fear of current or ex partner: Not on file    Emotionally abused: Not on file    Physically abused: Not on file    Forced sexual activity: Not on file  Other Topics Concern  . Not on file  Social History Narrative  . Not on file    Past Surgical History:  Procedure Laterality Date  . ABDOMINAL HYSTERECTOMY  09/29/2015   Procedure: HYSTERECTOMY ABDOMINAL;  Surgeon: Mora Bellman,  MD;  Location: [facility];  Service: Gynecology;;  . BILATERAL SALPINGECTOMY Bilateral 09/29/2015   Procedure: BILATERAL SALPINGECTOMY;  Surgeon: Mora Bellman, MD;  Location: [facility];  Service: Gynecology;  Laterality: Bilateral;  . TUBAL LIGATION    . WISDOM TOOTH EXTRACTION      Immunization History  Administered Date(s) Administered  . Influenza,inj,Quad PF,6+ Mos 08/01/2017    Current Meds  Medication Sig  . busPIRone (BUSPAR) 15 MG tablet Take 1 tablet (15 mg total) by mouth 2 (two) times daily.  . diphenhydrAMINE (BENADRYL) 25 MG tablet Take 25 mg by mouth every 6 (six) hours as needed.  Marland Kitchen FLUZONE QUADRIVALENT 0.5 ML injection inject 0.5 milliliter intramuscularly  . furosemide (LASIX) 20 MG tablet Take 1 tablet (20 mg total) by mouth daily as needed.  . meloxicam (MOBIC) 15 MG tablet Take 1 tablet (15 mg total) by mouth daily.  . pantoprazole (PROTONIX) 20 MG tablet Take 1  tablet (20 mg total) by mouth daily.  . Prenatal Vit-Fe Fumarate-FA (PRENATAL MULTIVITAMIN) TABS tablet Take 1 tablet by mouth daily at 12 noon.    Allergies  Allergen Reactions  . Prednisone     Insomnia "it has me up all night"  . Hydrocodone Rash    BP 116/68 (BP Location: Left Arm, Patient Position: Sitting, Cuff Size: Small)   Pulse 84   Temp 98 F (36.7 C) (Oral)   Ht 5' 4"  (1.626 m)   Wt 158 lb (71.7 kg)   LMP 08/14/2016   SpO2 100%   BMI 27.12 kg/m    Review of Systems  Constitutional: Negative.   HENT: Negative.   Eyes: Negative.   Respiratory: Negative.   Cardiovascular: Negative.   Endocrine: Negative.   Genitourinary: Negative.   Musculoskeletal: Negative.   Allergic/Immunologic: Negative.   Neurological: Negative.   Hematological: Negative.   Psychiatric/Behavioral: Negative.    Objective:   Physical Exam  Constitutional: She is oriented to person, place, and time. She appears well-developed and well-nourished.  HENT:  Head: Normocephalic and atraumatic.  Right Ear: External ear normal.  Left Ear: External ear normal.  Nose: Nose normal.  Mouth/Throat: Oropharynx is clear and moist.  Eyes: Pupils are equal, round, and reactive to light. Conjunctivae and EOM are normal.  Neck: Normal range of motion. Neck supple.  Cardiovascular: Normal rate, regular rhythm, normal heart sounds and intact distal pulses.  Pulmonary/Chest: Effort normal and breath sounds normal.  Abdominal: Soft. Bowel sounds are normal. There is tenderness (upper mid ).  Musculoskeletal: Normal range of motion.  Neurological: She is alert and oriented to person, place, and time. Sensory deficit:         Skin: Skin is warm and dry. Capillary refill takes less than 2 seconds.  Psychiatric: She has a normal mood and affect. Her behavior is normal. Judgment and thought content normal.  Nursing note and vitals reviewed.  Assessment & Plan:   1. Chronic pain of both knees Stable.  She will continue Neurontin as directed.  - gabapentin (NEURONTIN) 300 MG capsule; Take 1 capsule (300 mg total) by mouth 3 (three) times daily.  Dispense: 90 capsule; Refill: 3  2. Abdominal distension (gaseous) She will make appointment with GI today for further evaluation of chronic abdominal pain. We have obtained her new phone number and placed it in her chart.   3. Memory changes She states that her memory has changed. Probable r/t to anxiety, new health concerns, and distorted  body image. Mini mental state exam performed  today. She scored a 30/30. We will continue to monitor.   4. Anxiety Stable. She will continue Buspar as directed.   5. Follow up She will follow up in 1 month. We will do cerumen lavage at that time.  Meds ordered this encounter  Medications  . gabapentin (NEURONTIN) 300 MG capsule    Sig: Take 1 capsule (300 mg total) by mouth 3 (three) times daily.    Dispense:  90 capsule    Refill:  [place],  MSN, FNP-BC [facility] [address] [place], [place] [address] [phone]

## 2018-05-25 ENCOUNTER — Ambulatory Visit (HOSPITAL_COMMUNITY)
Admission: EM | Admit: 2018-05-25 | Discharge: 2018-05-25 | Disposition: A | Discharge status: Home / Self Care | Payer: Medicaid Other | Attending: Family Medicine | Admitting: Family Medicine

## 2018-05-25 DIAGNOSIS — H18892 Other specified disorders of cornea, left eye: Secondary | ICD-10-CM

## 2018-05-25 MED ORDER — POLYETHYL GLYCOL-PROPYL GLYCOL 0.4-0.3 % OP GEL: 1.0000 "application " | Freq: Every evening | OPHTHALMIC | 0 refills | Status: DC | PRN

## 2018-05-25 MED ORDER — EYE WASH OPHTH SOLN
OPHTHALMIC | Status: AC
  Filled 2018-05-25: qty 118

## 2018-05-25 NOTE — ED Provider Notes (Addendum)
[place]    CSN: [id number] Arrival date & time: 05/25/18  1519     History   Chief Complaint Chief Complaint  Patient presents with  . Foreign Body in [place] is a 42 y.o. female.   42 year old female comes in for possible foreign body in the left eye. States was resting earlier today when she felt as if something got into her eye.  She has continued eye watering without crusting in the morning.  Denies vision changes, photophobia, injury to the eye, eye redness.  Denies URI symptoms such as cough, congestion, sore throat.  She tried to put over-the-counter eyedrops without relief.  She denies any contact lens, glasses use.     Past Medical History:  Diagnosis Date  . Fibroids   . History of hiatal hernia   . Leg pain   . Migraine   . Sciatica     Patient Active Problem List   Diagnosis Date Noted  . Chronic pain syndrome 03/31/2016  . Screening for diabetes mellitus 12/31/2015  . S/P TAH (total abdominal hysterectomy) 09/29/2015  . Fibroid, uterine   . Menorrhagia with regular cycle   . Fibroid uterus 07/01/2015  . Left hip pain 11/06/2014    Past Surgical History:  Procedure Laterality Date  . ABDOMINAL HYSTERECTOMY  09/29/2015   Procedure: HYSTERECTOMY ABDOMINAL;  Surgeon: Mora Bellman, MD;  Location: [facility];  Service: Gynecology;;  . BILATERAL SALPINGECTOMY Bilateral 09/29/2015   Procedure: BILATERAL SALPINGECTOMY;  Surgeon: Mora Bellman, MD;  Location: [facility];  Service: Gynecology;  Laterality: Bilateral;  . TUBAL LIGATION    . WISDOM TOOTH EXTRACTION      OB History    Gravida  2   Para  2   Term  2   Preterm  0   AB  0   Living  2     SAB  0   TAB  0   Ectopic  0   Multiple  0   Live Births               Home Medications    Prior to Admission medications   Medication Sig Start Date End Date Taking? Authorizing Provider  busPIRone (BUSPAR) 15 MG tablet Take 1 tablet (15 mg total) by mouth  2 (two) times daily. 04/13/18   Dorena Dew, FNP  diphenhydrAMINE (BENADRYL) 25 MG tablet Take 25 mg by mouth every 6 (six) hours as needed.    [provider]  FLUZONE QUADRIVALENT 0.5 ML injection inject 0.5 milliliter intramuscularly 12/09/17   [provider]  furosemide (LASIX) 20 MG tablet Take 1 tablet (20 mg total) by mouth daily as needed. 02/19/18   Scot Jun, FNP  gabapentin (NEURONTIN) 300 MG capsule Take 1 capsule (300 mg total) by mouth 3 (three) times daily. 05/14/18   Azzie Glatter, FNP  meloxicam (MOBIC) 15 MG tablet Take 1 tablet (15 mg total) by mouth daily. 02/13/18   Scot Jun, FNP  pantoprazole (PROTONIX) 20 MG tablet Take 1 tablet (20 mg total) by mouth daily. 04/13/18   Dorena Dew, FNP  Polyethyl Glycol-Propyl Glycol (SYSTANE) 0.4-0.3 % GEL ophthalmic gel Place 1 application into both eyes at bedtime as needed. 05/25/18   Ok Edwards, PA-C  Prenatal Vit-Fe Fumarate-FA (PRENATAL MULTIVITAMIN) TABS tablet Take 1 tablet by mouth daily at 12 noon.    [provider]    Family History Family History  Problem Relation Age of Onset  . Diabetes Sister   . Heart disease Paternal Grandmother   . Hypertension Father   . Dementia Mother   . Hypertension Mother     Social History Social History   Tobacco Use  . Smoking status: Former Smoker    Last attempt to quit: 12/05/1996    Years since quitting: 21.4  . Smokeless tobacco: Never Used  Substance Use Topics  . Alcohol use: No  . Drug use: No     Allergies   Prednisone and Hydrocodone   Review of Systems Review of Systems  Reason unable to perform ROS: See HPI as above.     Physical Exam Triage Vital Signs ED Triage Vitals  Enc Vitals Group     BP 05/25/18 1536 110/70     Pulse Rate 05/25/18 1536 96     Resp 05/25/18 1536 (!) 22     Temp 05/25/18 1536 97.9 F (36.6 C)     Temp Source 05/25/18 1536 Oral     SpO2 05/25/18 1536 98 %     Weight --       Height --      Head Circumference --      Peak Flow --      Pain Score 05/25/18 1534 8     Pain Loc --      Pain Edu? --      Excl. in [place]? --    No data found.  Updated Vital Signs BP 110/70 (BP Location: Right Arm)   Pulse 96   Temp 97.9 F (36.6 C) (Oral)   Resp (!) 22   LMP 08/14/2016   SpO2 98%   Visual Acuity Right Eye Distance:   Left Eye Distance: 20/40 Bilateral Distance:    Right Eye Near: R Near: 20/40 Left Eye Near:  L Near: (unable to complete) Bilateral Near:  20/30  Physical Exam  Constitutional: She is oriented to person, place, and time. She appears well-developed and well-nourished. No distress.  HENT:  Head: Normocephalic and atraumatic.  Eyes: Pupils are equal, round, and reactive to light. Conjunctivae, EOM and lids are normal. Lids are everted and swept, no foreign bodies found. No foreign body present in the left eye.  Fluorescein stain without uptake.  Neurological: She is alert and oriented to person, place, and time.  Skin: She is not diaphoretic.   UC Treatments / Results  Labs (all labs ordered are listed, but only abnormal results are displayed) Labs Reviewed - No data to display  EKG None  Radiology No results found.  Procedures Procedures (including critical care time)  Medications Ordered in UC Medications - No data to display  Initial Impression / Assessment and Plan / UC Course  I have reviewed the triage vital signs and the nursing notes.  Pertinent labs & imaging results that were available during my care of the patient were reviewed by me and considered in my medical decision making (see chart for details).    No foreign body, corneal abrasion seen. Patient with relief after tetracaine and saline rinse. Will have patient start artificial tear gel, lid scrubs, warm compresses. Return precautions given. Patient expresses understanding and agrees to plan.  Final Clinical Impressions(s) / UC Diagnoses   Final diagnoses:    Corneal irritation of left eye    ED Prescriptions    Medication Sig Dispense Auth. Provider   Polyethyl Glycol-Propyl Glycol (SYSTANE) 0.4-0.3 % GEL ophthalmic gel Place 1 application into both eyes at  bedtime as needed. 1 Bottle Oneita Kras 05/25/18 [place], Yeshaya Vath V, PA-C 05/25/18 1657

## 2018-05-25 NOTE — Discharge Instructions (Signed)
No foreign body seen today. No cuts of the eye. You can continue to use the eye rinse if it helps the symptoms. Start artificial tear gel as directed. Lid scrubs and warm compresses as directed. Monitor for any worsening of symptoms, changes in vision, sensitivity to light, eye swelling, painful eye movement, follow up with ophthalmology for further evaluation.

## 2018-05-25 NOTE — ED Triage Notes (Signed)
Pt states she believes that she has hair in her eye but is unsure, she cant flush whatever is in her eye out.

## 2018-05-30 ENCOUNTER — Encounter: Payer: Self-pay | Admitting: Family Medicine

## 2018-05-30 NOTE — Progress Notes (Signed)
Patient no-showed for latest GI consult.

## 2018-06-13 ENCOUNTER — Ambulatory Visit (INDEPENDENT_AMBULATORY_CARE_PROVIDER_SITE_OTHER): Payer: Medicaid Other | Admitting: Family Medicine

## 2018-06-13 ENCOUNTER — Encounter: Payer: Self-pay | Admitting: Family Medicine

## 2018-06-13 VITALS — BP 104/62 | HR 84 | Temp 98.1°F | Ht 64.0 in | Wt 158.0 lb

## 2018-06-13 DIAGNOSIS — F419 Anxiety disorder, unspecified: Secondary | ICD-10-CM

## 2018-06-13 DIAGNOSIS — R109 Unspecified abdominal pain: Secondary | ICD-10-CM | POA: Diagnosis not present

## 2018-06-13 DIAGNOSIS — Z09 Encounter for follow-up examination after completed treatment for conditions other than malignant neoplasm: Secondary | ICD-10-CM

## 2018-06-13 DIAGNOSIS — R140 Abdominal distension (gaseous): Secondary | ICD-10-CM

## 2018-06-13 NOTE — Progress Notes (Signed)
Follow Up Appointment  Subjective:    Patient ID: Brianna Velez, female    DOB: February 21, 1976, 42 y.o.   MRN: [id number]   PCP: Kathe Becton, NP  Chief Complaint  Patient presents with  . Follow-up    knee pain   . Gastroesophageal Reflux    HPI  Brianna Velez has a past medical history of Sciatica, Migraine, Hiatal Hernia, and Fibroids. She is here today for follow up.   Current Status: She continues to have abdominal discomfort, gaseous, bloating, and mild pain. She c/o flatulence. No other reports of GI problems such as nausea, vomiting, diarrhea, and constipation. She has no reports of blood in stools, dysuria and hematuria.   She denies fevers, chills, fatigue, recent infections, weight loss, and night sweats.   She has not had any headaches, visual changes, dizziness, and falls.   No chest pain, heart palpitations, cough and shortness of breath reported.   She has situational anxiety r/t her abdominal discomfort.  She denies pain today.   She has mild bilateral peripheral edema.   Past Medical History:  Diagnosis Date  . Fibroids   . History of hiatal hernia   . Leg pain   . Migraine   . Sciatica     Family History  Problem Relation Age of Onset  . Diabetes Sister   . Heart disease Paternal Grandmother   . Hypertension Father   . Dementia Mother   . Hypertension Mother     Social History   Socioeconomic History  . Marital status: Married    Spouse name: Not on file  . Number of children: Not on file  . Years of education: Not on file  . Highest education level: Not on file  Occupational History  . Not on file  Social Needs  . Financial resource strain: Not on file  . Food insecurity:    Worry: Not on file    Inability: Not on file  . Transportation needs:    Medical: Not on file    Non-medical: Not on file  Tobacco Use  . Smoking status: Former Smoker    Last attempt to quit: 12/05/1996    Years since quitting: 21.5  . Smokeless tobacco: Never  Used  Substance and Sexual Activity  . Alcohol use: No  . Drug use: No  . Sexual activity: Not on file  Lifestyle  . Physical activity:    Days per week: Not on file    Minutes per session: Not on file  . Stress: Not on file  Relationships  . Social connections:    Talks on phone: Not on file    Gets together: Not on file    Attends religious service: Not on file    Active member of club or organization: Not on file    Attends meetings of clubs or organizations: Not on file    Relationship status: Not on file  . Intimate partner violence:    Fear of current or ex partner: Not on file    Emotionally abused: Not on file    Physically abused: Not on file    Forced sexual activity: Not on file  Other Topics Concern  . Not on file  Social History Narrative  . Not on file    Past Surgical History:  Procedure Laterality Date  . ABDOMINAL HYSTERECTOMY  09/29/2015   Procedure: HYSTERECTOMY ABDOMINAL;  Surgeon: Mora Bellman, MD;  Location: [facility];  Service: Gynecology;;  . BILATERAL SALPINGECTOMY Bilateral 09/29/2015  Procedure: BILATERAL SALPINGECTOMY;  Surgeon: Mora Bellman, MD;  Location: [facility];  Service: Gynecology;  Laterality: Bilateral;  . TUBAL LIGATION    . WISDOM TOOTH EXTRACTION     Immunization History  Administered Date(s) Administered  . Influenza,inj,Quad PF,6+ Mos 08/01/2017, 12/09/2017    Current Meds  Medication Sig  . busPIRone (BUSPAR) 15 MG tablet Take 1 tablet (15 mg total) by mouth 2 (two) times daily.  . furosemide (LASIX) 20 MG tablet Take 1 tablet (20 mg total) by mouth daily as needed.  . gabapentin (NEURONTIN) 300 MG capsule Take 1 capsule (300 mg total) by mouth 3 (three) times daily.  . meloxicam (MOBIC) 15 MG tablet Take 1 tablet (15 mg total) by mouth daily.  . pantoprazole (PROTONIX) 20 MG tablet Take 1 tablet (20 mg total) by mouth daily.   Allergies  Allergen Reactions  . Prednisone     Insomnia "it has me up all night"  .  Hydrocodone Rash   BP 104/62 (BP Location: Left Arm, Patient Position: Sitting, Cuff Size: Small)   Pulse 84   Temp 98.1 F (36.7 C) (Oral)   Ht 5\' 4"  (1.626 m)   Wt 158 lb (71.7 kg)   LMP 08/14/2016   SpO2 99%   BMI 27.12 kg/m    Review of Systems  Constitutional: Negative.   HENT: Negative.   Eyes: Negative.   Respiratory: Negative.   Cardiovascular: Negative.   Gastrointestinal: Positive for abdominal distention (Bloated).  Endocrine: Negative.   Genitourinary: Negative.   Musculoskeletal: Negative.   Skin: Negative.   Allergic/Immunologic: Negative.   Neurological: Negative.   Hematological: Negative.   Psychiatric/Behavioral: Negative.    Objective:   Physical Exam  Constitutional: She appears well-developed and well-nourished.  HENT:  Head: Normocephalic and atraumatic.  Right Ear: External ear normal.  Left Ear: External ear normal.  Nose: Nose normal.  Mouth/Throat: Oropharynx is clear and moist.  Eyes: Pupils are equal, round, and reactive to light. Conjunctivae and EOM are normal.  Neck: Normal range of motion. Neck supple.  Cardiovascular: Normal rate, regular rhythm, normal heart sounds and intact distal pulses.  Pulmonary/Chest: Effort normal.  Abdominal: Soft. Bowel sounds are normal. She exhibits distension (Bloated).  Musculoskeletal: Normal range of motion.  Neurological: She is alert.  Skin: Skin is warm and dry. Capillary refill takes less than 2 seconds.  Psychiatric: She has a normal mood and affect. Her behavior is normal. Judgment and thought content normal.   Assessment & Plan:   1. Abdominal pain, unspecified abdominal location We have referred her to GI. Her appointment is on 07/05/2018 at 9:00 am.   2. Abdominal distension (gaseous) She will begin a daily probiotic yogurt to help with digestion.   3. Anxiety Mild anxiety r/t abdominal problems.   4. Follow up She will follow up in 1 month.   No orders of the defined types were  placed in this encounter.  Kathe Becton,  MSN, FNP-C [facility] [address] [place], [place] [address] [phone]

## 2018-06-21 IMAGING — CR DG CHEST 2V
2 series · 2 of 2 positions shown · non-contrast
Comparison: Chest x-ray of October 12, 2017

CLINICAL DATA: Days of chest pain with increased severity today.
Former smoker.

EXAM:
CHEST  2 VIEW

[w chest pa]
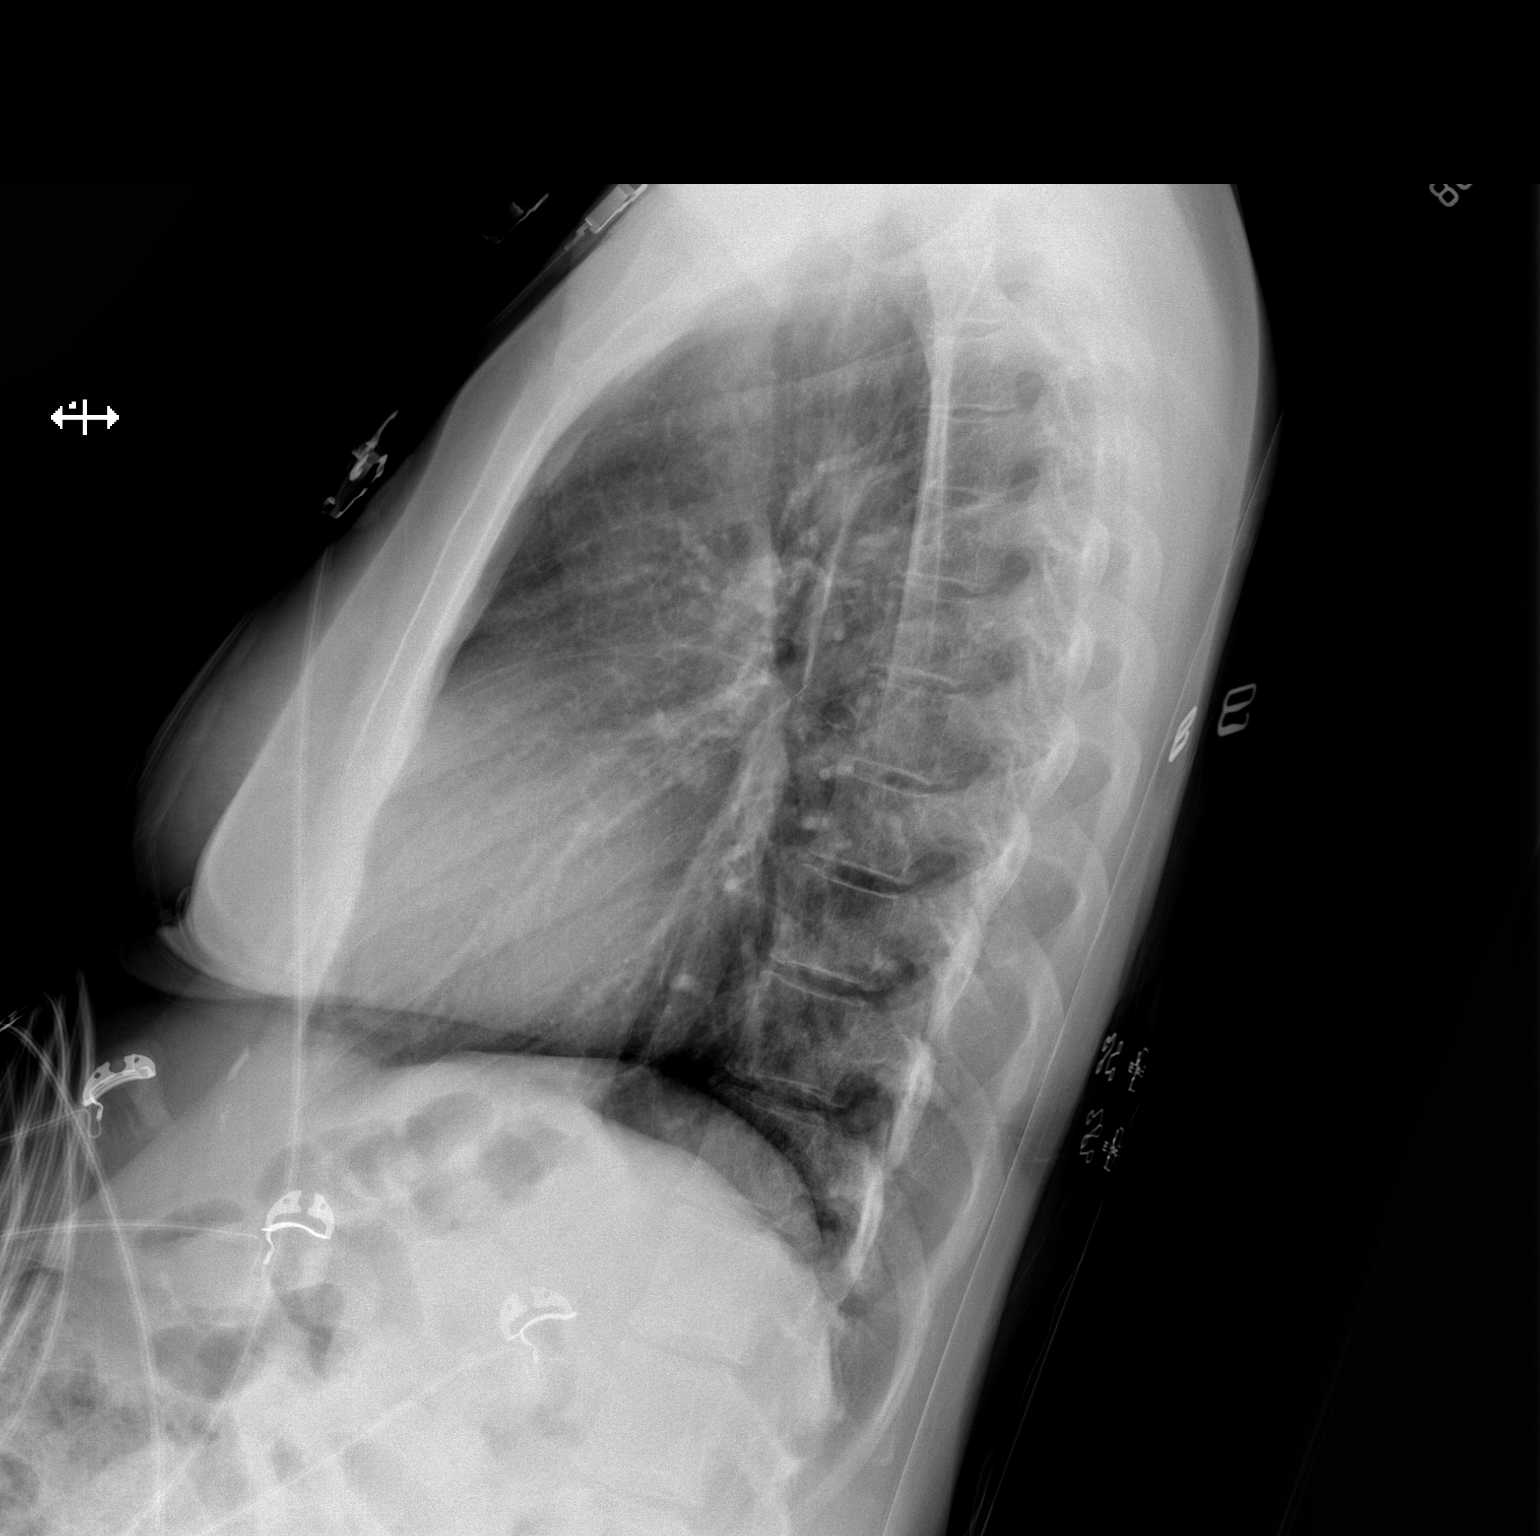

[x chest ap]
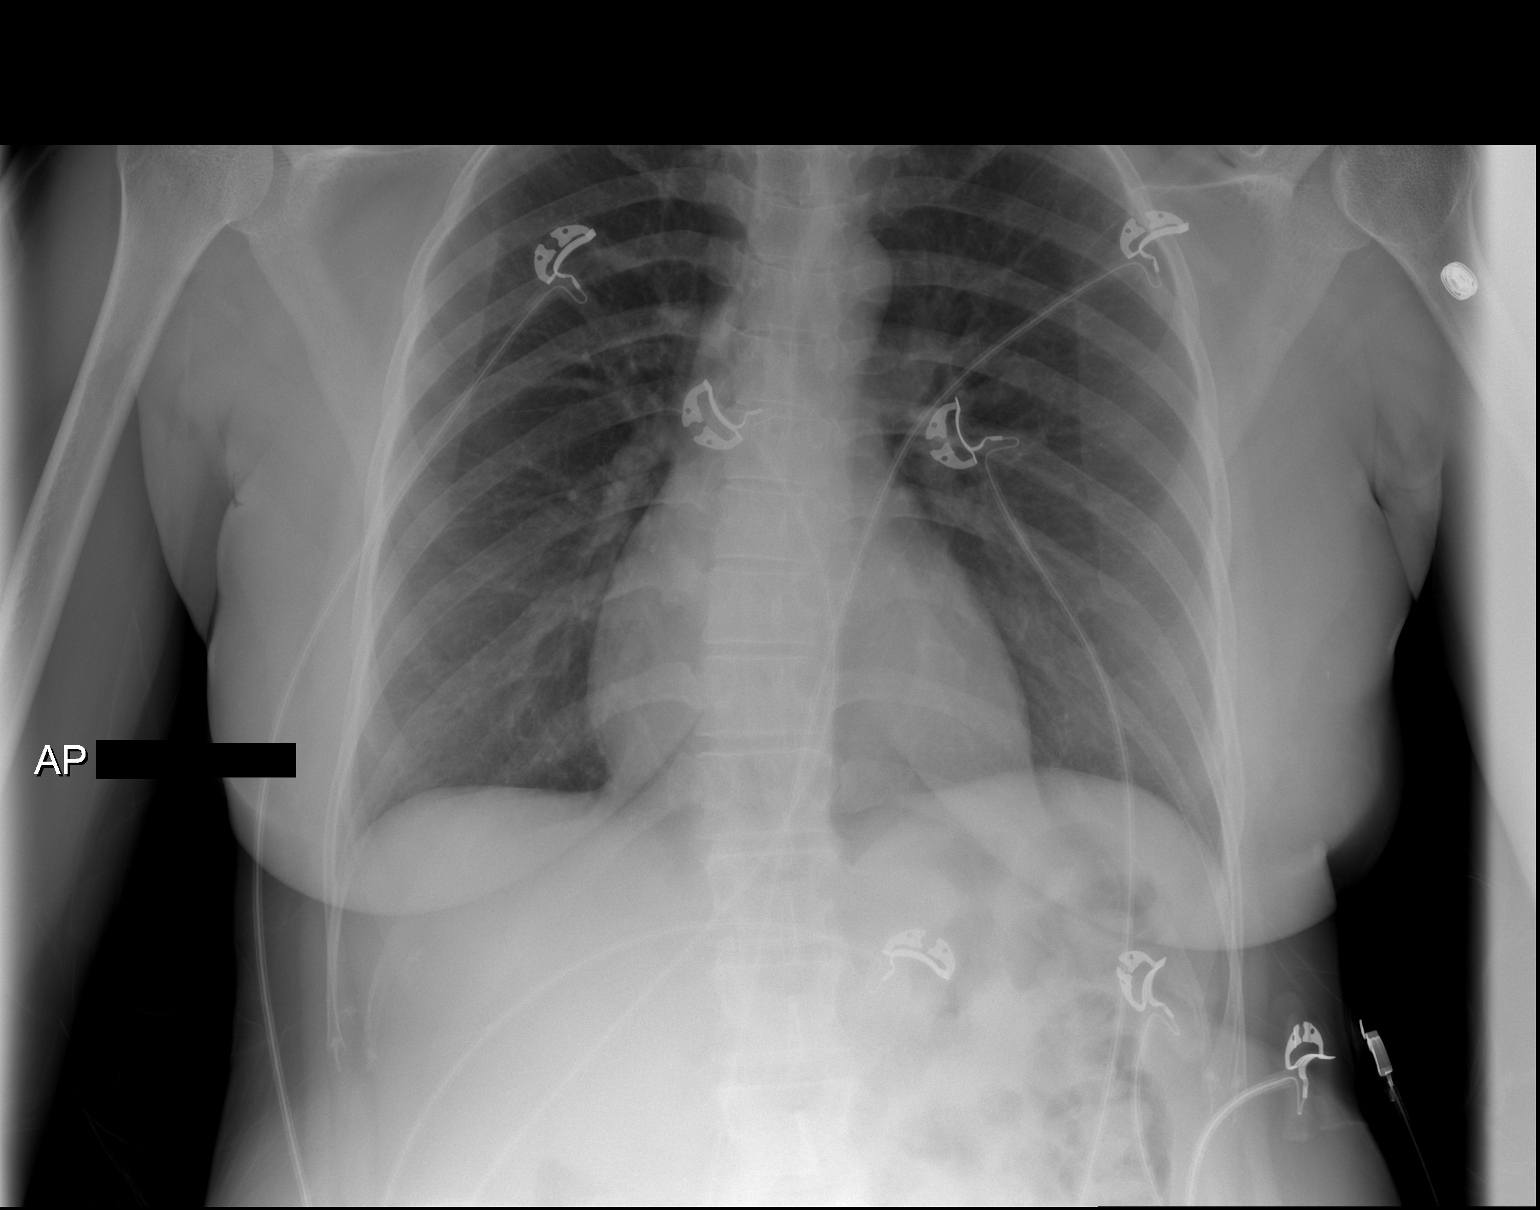

[2 of 2 positions shown; findings below may reference images not displayed]

FINDINGS: The lungs are adequately inflated and clear. The heart and
mediastinal structures are normal. There is no pleural effusion. The
bony thorax is unremarkable.
IMPRESSION: There is no active cardiopulmonary disease.

## 2018-07-05 ENCOUNTER — Ambulatory Visit (INDEPENDENT_AMBULATORY_CARE_PROVIDER_SITE_OTHER): Payer: Medicaid Other | Admitting: Gastroenterology

## 2018-07-05 ENCOUNTER — Encounter

## 2018-07-05 ENCOUNTER — Encounter: Payer: Self-pay | Admitting: Gastroenterology

## 2018-07-05 VITALS — BP 108/72 | HR 72 | Ht 64.0 in | Wt 154.4 lb

## 2018-07-05 DIAGNOSIS — K219 Gastro-esophageal reflux disease without esophagitis: Secondary | ICD-10-CM | POA: Diagnosis not present

## 2018-07-05 DIAGNOSIS — R140 Abdominal distension (gaseous): Secondary | ICD-10-CM

## 2018-07-05 DIAGNOSIS — R194 Change in bowel habit: Secondary | ICD-10-CM

## 2018-07-05 DIAGNOSIS — R143 Flatulence: Secondary | ICD-10-CM

## 2018-07-05 MED ORDER — NA SULFATE-K SULFATE-MG SULF 17.5-3.13-1.6 GM/177ML PO SOLN: 1.0000 | Freq: Once | ORAL | 0 refills | Status: AC

## 2018-07-05 NOTE — Patient Instructions (Signed)
If you are age 45 or older, your body mass index should be between 23-30. Your Body mass index is 26.5 kg/m. If this is out of the aforementioned range listed, please consider follow up with your Primary Care Provider.  If you are age 38 or younger, your body mass index should be between 19-25. Your Body mass index is 26.5 kg/m. If this is out of the aformentioned range listed, please consider follow up with your Primary Care Provider.   You have been scheduled for an endoscopy and colonoscopy. Please follow the written instructions given to you at your visit today. Please pick up your prep supplies at the pharmacy within the next 1-3 days. If you use inhalers (even only as needed), please bring them with you on the day of your procedure. Your physician has requested that you go to www.startemmi.com and enter the access code given to you at your visit today. This web site gives a general overview about your procedure. However, you should still follow specific instructions given to you by our office regarding your preparation for the procedure.  Your provider has requested that you go to the basement level for lab work before leaving today. Press "B" on the elevator. The lab is located at the first door on the left as you exit the elevator. HAVE LABS DRAWN AT 730AM.   It was a pleasure to meet you today!  Dr. Loletha Carrow

## 2018-07-05 NOTE — Progress Notes (Addendum)
[facility] Consult Note:  History: Brianna Velez 07/05/2018  Referring physician: Azzie Glatter, FNP  Reason for consult/chief complaint: Bloated (been that way since last year); Gas (constant ); Change in bowels (Change from constpation and diarrhea. Watery diarrhea); and Abdominal Pain (Mid abdominal pain )   Subjective  HPI:  This is a pleasant 42 year old woman referred by primary care for over a year of abdominal pain and altered bowel habits.  She has chronic upper abdominal bloating with intermittent crampy pain.  She had previously been constipated and now more likely to have loose stool.  She has a constant feeling of gas and bloating.  She has had at least one ED visit for this, and a CT scan as described below.   Over a year ago, she began having significant increased abdominal distention that has persisted to the same degree since then.  She has frequent passage of gas and stools that alternate between constipation and diarrhea.  Even when they are loose, she feels that she has difficulty having a bowel movement for 1 or 2 days.  There is been no rectal bleeding.  She cannot sleep on her stomach the way she would like to because of the uncomfortable pressure on her abdomen from this distention.  She is bothered that people think she is pregnant.  Her appetite is apparently affected by it, but she has steadily gained weight over this last year based on available records.  Chronic regurgitation Chronic anxiety. Prior episode chest pain, reportedly due to anxiety/panic attacks Weight 138 # Nov 2018 -> 158 # last month, 154# today Unclear how long on Buspirone, but reports it has helped anxiety considerably. ROS:  Review of Systems  Constitutional: Negative for appetite change and unexpected weight change.  HENT: Negative for mouth sores and voice change.   Eyes: Negative for pain and redness.  Respiratory: Negative for cough and shortness of breath.     Cardiovascular: Negative for chest pain and palpitations.  Genitourinary: Negative for dysuria and hematuria.  Musculoskeletal: Positive for arthralgias. Negative for myalgias.       Chronic bilateral knee pain, exacerbated by her job working on a [employer]  Skin: Negative for pallor and rash.  Neurological: Negative for weakness and headaches.  Hematological: Negative for adenopathy.  Psychiatric/Behavioral: The patient is nervous/anxious.    See above, panic attacks causing chest pain by her report.  Apparently much better on BuSpar.  Past Medical History: Past Medical History:  Diagnosis Date  . Fibroids   . History of hiatal hernia   . Leg pain   . Migraine   . Sciatica      Past Surgical History: Past Surgical History:  Procedure Laterality Date  . ABDOMINAL HYSTERECTOMY  09/29/2015   Procedure: HYSTERECTOMY ABDOMINAL;  Surgeon: Mora Bellman, MD;  Location: [facility];  Service: Gynecology;;  . BILATERAL SALPINGECTOMY Bilateral 09/29/2015   Procedure: BILATERAL SALPINGECTOMY;  Surgeon: Mora Bellman, MD;  Location: [facility];  Service: Gynecology;  Laterality: Bilateral;  . TUBAL LIGATION    . WISDOM TOOTH EXTRACTION       Family History: Family History  Problem Relation Age of Onset  . Diabetes Sister   . Heart disease Paternal Grandmother   . Hypertension Father   . Dementia Mother   . Hypertension Mother     Social History: Social History   Socioeconomic History  . Marital status: Married    Spouse name: Not on file  . Number of children: Not  on file  . Years of education: Not on file  . Highest education level: Not on file  Occupational History  . Not on file  Social Needs  . Financial resource strain: Not on file  . Food insecurity:    Worry: Not on file    Inability: Not on file  . Transportation needs:    Medical: Not on file    Non-medical: Not on file  Tobacco Use  . Smoking status: Former Smoker    Last attempt to quit: 12/05/1996     Years since quitting: 21.5  . Smokeless tobacco: Never Used  Substance and Sexual Activity  . Alcohol use: No  . Drug use: No  . Sexual activity: Not on file  Lifestyle  . Physical activity:    Days per week: Not on file    Minutes per session: Not on file  . Stress: Not on file  Relationships  . Social connections:    Talks on phone: Not on file    Gets together: Not on file    Attends religious service: Not on file    Active member of club or organization: Not on file    Attends meetings of clubs or organizations: Not on file    Relationship status: Not on file  Other Topics Concern  . Not on file  Social History Narrative  . Not on file   Liver with her children and a sister.  Safe environment. Allergies: Allergies  Allergen Reactions  . Prednisone     Insomnia "it has me up all night"  . Hydrocodone Rash    Outpatient Meds: Current Outpatient Medications  Medication Sig Dispense Refill  . busPIRone (BUSPAR) 15 MG tablet Take 1 tablet (15 mg total) by mouth 2 (two) times daily. 60 tablet 3  . diphenhydrAMINE (BENADRYL) 25 MG tablet Take 25 mg by mouth every 6 (six) hours as needed.    . furosemide (LASIX) 20 MG tablet Take 1 tablet (20 mg total) by mouth daily as needed. 30 tablet 3  . gabapentin (NEURONTIN) 300 MG capsule Take 1 capsule (300 mg total) by mouth 3 (three) times daily. 90 capsule 3  . meloxicam (MOBIC) 15 MG tablet Take 1 tablet (15 mg total) by mouth daily. 30 tablet 2  . pantoprazole (PROTONIX) 20 MG tablet Take 1 tablet (20 mg total) by mouth daily. 30 tablet 1  . Prenatal Vit-Fe Fumarate-FA (PRENATAL MULTIVITAMIN) TABS tablet Take 1 tablet by mouth daily at 12 noon.     No current facility-administered medications for this visit.       ___________________________________________________________________ Objective   Exam:  BP 108/72   Pulse 72   Ht 5\' 4"  (1.626 m)   Wt 154 lb 6 oz (70 kg)   LMP 08/14/2016   BMI 26.50 kg/m  She does not  look cushingoid.  No neck hump, moon face ease or abdominal striae   General: this is a(n) well-appearing woman, depressed affect, pleasant and conversational.  Antalgic gait due to knee pain  Eyes: sclera anicteric, no redness  ENT: oral mucosa moist without lesions, no cervical or supraclavicular lymphadenopathy, good dentition  CV: RRR without murmur, S1/S2, no JVD, no peripheral edema  Resp: clear to auscultation bilaterally, normal RR and effort noted  GI: soft, protuberant, mild generalized but mostly upper tenderness, with active bowel sounds. No guarding or palpable organomegaly noted.  Not tympanitic, no bulging flanks or shifting dullness  Skin; warm and dry, no rash or jaundice noted  Neuro: awake, alert and oriented x 3. Normal gross motor function and fluent speech  Labs:  CMP Latest Ref Rng & Units 04/13/2018 02/19/2018 10/18/2017  Glucose 65 - 99 mg/dL 96 - 91  BUN 6 - 24 mg/dL 13 - 12  Creatinine 0.57 - 1.00 mg/dL 0.76 - 0.91  Sodium 134 - 144 mmol/L 136 - 136  Potassium 3.5 - 5.2 mmol/L 4.1 4.5 4.3  Chloride 96 - 106 mmol/L 101 - 99(L)  CO2 20 - 29 mmol/L 20 - 28  Calcium 8.7 - 10.2 mg/dL 9.1 - 9.8  Total Protein 6.0 - 8.5 g/dL 7.1 - 8.7(H)  Total Bilirubin 0.0 - 1.2 mg/dL <0.2 - 0.5  Alkaline Phos 39 - 117 IU/L 52 - 63  AST 0 - 40 IU/L 15 - 21  ALT 0 - 32 IU/L 11 - 13(L)   CBC Latest Ref Rng & Units 04/13/2018 10/18/2017 10/12/2017  WBC 3.4 - 10.8 x10E3/uL 4.3 4.1 4.3  Hemoglobin 11.1 - 15.9 g/dL 13.1 14.5 12.7  Hematocrit 34.0 - 46.6 % 37.2 43.1 38.8  Platelets 150 - 379 x10E3/uL 219 201 226   Lipase normal May 2019, normal last July also  TSH normal 2.07 June 2017  Radiologic Studies:  CLINICAL DATA:  Abdomen distension for 1 month   EXAM: CT ABDOMEN AND PELVIS WITH CONTRAST   TECHNIQUE: Multidetector CT imaging of the abdomen and pelvis was performed using the standard protocol following bolus administration of intravenous contrast.     CONTRAST:  80 mL Isovue 300 intravenous   COMPARISON:  None.   FINDINGS: Lower chest: No acute abnormality.   Hepatobiliary: No focal liver abnormality is seen. No gallstones, gallbladder wall thickening, or biliary dilatation.   Pancreas: Unremarkable. No pancreatic ductal dilatation or surrounding inflammatory changes.   Spleen: Possible vague hypodense lesions within the spleen, versus heterogenous enhancement.   Adrenals/Urinary Tract: Adrenal glands are unremarkable. Kidneys are normal, without renal calculi, focal lesion, or hydronephrosis. Bladder is unremarkable.   Stomach/Bowel: Stomach is within normal limits. Appendix appears normal. No evidence of bowel wall thickening, distention, or inflammatory changes. Moderate stool burden in the colon.   Vascular/Lymphatic: No significant vascular findings are present. No enlarged abdominal or pelvic lymph nodes.   Reproductive: Status post hysterectomy. No adnexal masses.   Other: Tiny nodule in the left upper quadrant, series 3, image number 24. No free air or free fluid. Fat in the umbilicus.   Musculoskeletal: No acute or significant osseous findings.   IMPRESSION: 1. No definite CT evidence for acute intra-abdominal or pelvic pathology. Moderate stool burden in the colon. 2. Possible vague hypodense splenic lesions versus heterogenous enhancement. Suggest non emergent MRI correlation for follow-up. 3. Tiny 3 mm nodule in the left upper quadrant mesentery adjacent to the colon, possibly a low lying tiny splenule. Short interval CT follow-up could be considered to document stability.     Electronically Signed   By: Donavan Foil M.D.   On: 06/17/2017 00:51   Assessment: Encounter Diagnoses  Name Primary?  . Abdominal distension Yes  . Flatulence   . Altered bowel habits   . Gastroesophageal reflux disease, esophagitis presence not specified     Over a year ago she developed significant abdominal  distention with complaints of bloating, gas, change in bowel habits.  It is unknown what if any medicines or other health changes may have been doing prior to the onset of symptoms. She has not had intestinal surgery and does not have other apparent  risk factors for bacterial overgrowth.  Plan:  TSH and free T4 A.m. cortisol level EGD and colonoscopy.  She is agreeable after thorough discussion of procedure and risks.  The benefits and risks of the planned procedure were described in detail with the patient or (when appropriate) their health care proxy.  Risks were outlined as including, but not limited to, bleeding, infection, perforation, adverse medication reaction leading to cardiac or pulmonary decompensation, or pancreatitis (if ERCP).  The limitation of incomplete mucosal visualization was also discussed.  No guarantees or warranties were given.   Thank you for the courtesy of this consult.  Please call me with any questions or concerns.  Nelida Meuse III  CC: Azzie Glatter, FNP

## 2018-07-06 ENCOUNTER — Other Ambulatory Visit (INDEPENDENT_AMBULATORY_CARE_PROVIDER_SITE_OTHER): Payer: Medicaid Other

## 2018-07-06 DIAGNOSIS — R143 Flatulence: Secondary | ICD-10-CM

## 2018-07-06 DIAGNOSIS — K219 Gastro-esophageal reflux disease without esophagitis: Secondary | ICD-10-CM

## 2018-07-06 DIAGNOSIS — R140 Abdominal distension (gaseous): Secondary | ICD-10-CM

## 2018-07-06 DIAGNOSIS — R194 Change in bowel habit: Secondary | ICD-10-CM | POA: Diagnosis not present

## 2018-07-06 LAB — TSH: TSH: 1.69 u[IU]/mL (ref 0.35–4.50)

## 2018-07-06 LAB — CORTISOL: Cortisol, Plasma: 15.6 ug/dL

## 2018-07-06 LAB — T4, FREE: Free T4: 0.78 ng/dL (ref 0.60–1.60)

## 2018-07-10 ENCOUNTER — Other Ambulatory Visit: Payer: Self-pay | Admitting: Family Medicine

## 2018-07-10 ENCOUNTER — Telehealth: Payer: Self-pay

## 2018-07-10 DIAGNOSIS — R140 Abdominal distension (gaseous): Secondary | ICD-10-CM

## 2018-07-10 MED ORDER — PANTOPRAZOLE SODIUM 20 MG PO TBEC: 20.0000 mg | DELAYED_RELEASE_TABLET | Freq: Every day | ORAL | 1 refills | Status: DC

## 2018-07-10 NOTE — Telephone Encounter (Signed)
Medication sent to pharmacy  

## 2018-07-16 ENCOUNTER — Encounter: Payer: Self-pay | Admitting: Family Medicine

## 2018-07-16 ENCOUNTER — Ambulatory Visit (INDEPENDENT_AMBULATORY_CARE_PROVIDER_SITE_OTHER): Payer: Medicaid Other | Admitting: Family Medicine

## 2018-07-16 VITALS — BP 108/64 | HR 88 | Temp 98.1°F | Ht 64.0 in | Wt 158.0 lb

## 2018-07-16 DIAGNOSIS — M62838 Other muscle spasm: Secondary | ICD-10-CM

## 2018-07-16 DIAGNOSIS — G8929 Other chronic pain: Secondary | ICD-10-CM

## 2018-07-16 DIAGNOSIS — R140 Abdominal distension (gaseous): Secondary | ICD-10-CM | POA: Diagnosis not present

## 2018-07-16 DIAGNOSIS — R109 Unspecified abdominal pain: Secondary | ICD-10-CM | POA: Diagnosis not present

## 2018-07-16 DIAGNOSIS — Z09 Encounter for follow-up examination after completed treatment for conditions other than malignant neoplasm: Secondary | ICD-10-CM | POA: Diagnosis not present

## 2018-07-16 LAB — POCT URINALYSIS DIP (MANUAL ENTRY)
Bilirubin, UA: NEGATIVE
Blood, UA: NEGATIVE
Glucose, UA: NEGATIVE mg/dL
Ketones, POC UA: NEGATIVE mg/dL
Leukocytes, UA: NEGATIVE
Nitrite, UA: NEGATIVE
Protein Ur, POC: NEGATIVE mg/dL
Spec Grav, UA: 1.02 (ref 1.010–1.025)
Urobilinogen, UA: 0.2 E.U./dL
pH, UA: 7 (ref 5.0–8.0)

## 2018-07-16 MED ORDER — CYCLOBENZAPRINE HCL 10 MG PO TABS: 10.0000 mg | ORAL_TABLET | Freq: Three times a day (TID) | ORAL | 0 refills | Status: DC | PRN

## 2018-07-16 MED ORDER — MELOXICAM 15 MG PO TABS: 15.0000 mg | ORAL_TABLET | Freq: Every day | ORAL | 2 refills | Status: DC

## 2018-07-16 NOTE — Patient Instructions (Signed)
Meloxicam capsules What is this medicine? MELOXICAM (mel OX i cam) is a non-steroidal anti-inflammatory drug (NSAID). It is used to reduce swelling and to treat pain. It is used for osteoarthritis. This medicine may be used for other purposes; ask your health care provider or pharmacist if you have questions. COMMON BRAND NAME(S): Vivlodex What should I tell my health care provider before I take this medicine? They need to know if you have any of these conditions: -bleeding disorders -cigarette smoker -coronary artery bypass graft (CABG) surgery within the past 2 weeks -drink more than 3 alcohol-containing drinks per day -heart disease -high blood pressure -history of stomach bleeding -kidney disease -liver disease -lung or breathing disease, like asthma -stomach or intestine problems -an unusual or allergic reaction to meloxicam, aspirin, other NSAIDs, other medicines, foods, dyes, or preservatives -pregnant or trying to get pregnant -breast-feeding How should I use this medicine? Take this medicine by mouth with a full glass of water. Follow the directions on the prescription label. You can take it with or without food. If it upsets your stomach, take it with food. Take your medicine at regular intervals. Do not take it more often than directed. Do not stop taking except on your doctor's advice. A special MedGuide will be given to you by the pharmacist with each prescription and refill. Be sure to read this information carefully each time. Talk to your pediatrician regarding the use of this medicine in children. Special care may be needed. Patients over 65 years old may have a stronger reaction and need a smaller dose. Overdosage: If you think you have taken too much of this medicine contact a poison control center or emergency room at once. NOTE: This medicine is only for you. Do not share this medicine with others. What if I miss a dose? If you miss a dose, take it as soon as you  can. If it is almost time for your next dose, take only that dose. Do not take double or extra doses. What may interact with this medicine? Do not take this medicine with any of the following medications: -cidofovir -ketorolac This medicine may also interact with the following medications: -aspirin and aspirin-like medicines -certain medicines for blood pressure, heart disease, irregular heart beat -certain medicines for depression, anxiety, or psychotic disturbances -certain medicines that treat or prevent blood clots like warfarin, enoxaparin, dalteparin, apixaban, dabigatran, rivaroxaban -cyclosporine -digoxin -diuretics -methotrexate -other NSAIDs, medicines for pain and inflammation, like ibuprofen and naproxen -pemetrexed This list may not describe all possible interactions. Give your health care provider a list of all the medicines, herbs, non-prescription drugs, or dietary supplements you use. Also tell them if you smoke, drink alcohol, or use illegal drugs. Some items may interact with your medicine. What should I watch for while using this medicine? Tell your doctor or healthcare professional if your symptoms do not start to get better or if they get worse. Do not take other medicines that contain aspirin, ibuprofen, or naproxen with this medicine. Side effects such as stomach upset, nausea, or ulcers may be more likely to occur. Many medicines available without a prescription should not be taken with this medicine. This medicine can cause ulcers and bleeding in the stomach and intestines at any time during treatment. This can happen with no warning and may cause death. There is increased risk with taking this medicine for a long time. Smoking, drinking alcohol, older age, and poor health can also increase risks. Call your doctor right away if you   have stomach pain or blood in your vomit or stool. This medicine does not prevent heart attack or stroke. In fact, this medicine may  increase the chance of a heart attack or stroke. The chance may increase with longer use of this medicine and in people who have heart disease. If you take aspirin to prevent heart attack or stroke, talk with your doctor or health care professional. What side effects may I notice from receiving this medicine? Side effects that you should report to your doctor or health care professional as soon as possible: -allergic reactions like skin rash, itching or hives, swelling of the face, lips, or tongue -nausea, vomiting -signs and symptoms of a blood clot such as breathing problems; changes in vision; chest pain; severe, sudden headache; pain, swelling, warmth in the leg; trouble speaking; sudden numbness or weakness of the face, arm, or leg -signs and symptoms of bleeding such as bloody or black, tarry stools; red or dark-brown urine; spitting up blood or brown material that looks like coffee grounds; red spots on the skin; unusual bruising or bleeding from the eye, gums, or nose -signs and symptoms of liver injury like dark yellow or brown urine; general ill feeling or flu-like symptoms; light-colored stools; loss of appetite; nausea; right upper belly pain; unusually weak or tired; yellowing of the eyes or skin -signs and symptoms of stroke like changes in vision; confusion; trouble speaking or understanding; severe headaches; sudden numbness or weakness of the face, arm, or leg; trouble walking; dizziness; loss of balance or coordination Side effects that usually do not require medical attention (report to your doctor or health care professional if they continue or are bothersome): -constipation -diarrhea -gas This list may not describe all possible side effects. Call your doctor for medical advice about side effects. You may report side effects to FDA at 1-800-FDA-1088. Where should I keep my medicine? Keep out of the reach of children. Store at room temperature between 15 and 30 degrees C (59 and 86  degrees F). Throw away any unused medicine after the expiration date. NOTE: This sheet is a summary. It may not cover all possible information. If you have questions about this medicine, talk to your doctor, pharmacist, or health care provider.  2018 Elsevier/Gold Standard (2015-12-24 10:31:18) Cyclobenzaprine tablets What is this medicine? CYCLOBENZAPRINE (sye kloe BEN za preen) is a muscle relaxer. It is used to treat muscle pain, spasms, and stiffness. This medicine may be used for other purposes; ask your health care provider or pharmacist if you have questions. COMMON BRAND NAME(S): Fexmid, Flexeril What should I tell my health care provider before I take this medicine? They need to know if you have any of these conditions: -heart disease, irregular heartbeat, or previous heart attack -liver disease -thyroid problem -an unusual or allergic reaction to cyclobenzaprine, tricyclic antidepressants, lactose, other medicines, foods, dyes, or preservatives -pregnant or trying to get pregnant -breast-feeding How should I use this medicine? Take this medicine by mouth with a glass of water. Follow the directions on the prescription label. If this medicine upsets your stomach, take it with food or milk. Take your medicine at regular intervals. Do not take it more often than directed. Talk to your pediatrician regarding the use of this medicine in children. Special care may be needed. Overdosage: If you think you have taken too much of this medicine contact a poison control center or emergency room at once. NOTE: This medicine is only for you. Do not share this medicine with  others. What if I miss a dose? If you miss a dose, take it as soon as you can. If it is almost time for your next dose, take only that dose. Do not take double or extra doses. What may interact with this medicine? Do not take this medicine with any of the following medications: -certain medicines for fungal infections like  fluconazole, itraconazole, ketoconazole, posaconazole, voriconazole -cisapride -dofetilide -dronedarone -halofantrine -levomethadyl -MAOIs like Carbex, Eldepryl, Marplan, Nardil, and Parnate -narcotic medicines for cough -pimozide -thioridazine -ziprasidone This medicine may also interact with the following medications: -alcohol -antihistamines for allergy, cough and cold -certain medicines for anxiety or sleep -certain medicines for cancer -certain medicines for depression like amitriptyline, fluoxetine, sertraline -certain medicines for infection like alfuzosin, chloroquine, clarithromycin, levofloxacin, mefloquine, pentamidine, troleandomycin -certain medicines for irregular heart beat -certain medicines for seizures like phenobarbital, primidone -contrast dyes -general anesthetics like halothane, isoflurane, methoxyflurane, propofol -local anesthetics like lidocaine, pramoxine, tetracaine -medicines that relax muscles for surgery -narcotic medicines for pain -other medicines that prolong the QT interval (cause an abnormal heart rhythm) -phenothiazines like chlorpromazine, mesoridazine, prochlorperazine This list may not describe all possible interactions. Give your health care provider a list of all the medicines, herbs, non-prescription drugs, or dietary supplements you use. Also tell them if you smoke, drink alcohol, or use illegal drugs. Some items may interact with your medicine. What should I watch for while using this medicine? Tell your doctor or health care professional if your symptoms do not start to get better or if they get worse. You may get drowsy or dizzy. Do not drive, use machinery, or do anything that needs mental alertness until you know how this medicine affects you. Do not stand or sit up quickly, especially if you are an older patient. This reduces the risk of dizzy or fainting spells. Alcohol may interfere with the effect of this medicine. Avoid alcoholic  drinks. If you are taking another medicine that also causes drowsiness, you may have more side effects. Give your health care provider a list of all medicines you use. Your doctor will tell you how much medicine to take. Do not take more medicine than directed. Call emergency for help if you have problems breathing or unusual sleepiness. Your mouth may get dry. Chewing sugarless gum or sucking hard candy, and drinking plenty of water may help. Contact your doctor if the problem does not go away or is severe. What side effects may I notice from receiving this medicine? Side effects that you should report to your doctor or health care professional as soon as possible: -allergic reactions like skin rash, itching or hives, swelling of the face, lips, or tongue -breathing problems -chest pain -fast, irregular heartbeat -hallucinations -seizures -unusually weak or tired Side effects that usually do not require medical attention (report to your doctor or health care professional if they continue or are bothersome): -headache -nausea, vomiting This list may not describe all possible side effects. Call your doctor for medical advice about side effects. You may report side effects to FDA at 1-800-FDA-1088. Where should I keep my medicine? Keep out of the reach of children. Store at room temperature between 15 and 30 degrees C (59 and 86 degrees F). Keep container tightly closed. Throw away any unused medicine after the expiration date. NOTE: This sheet is a summary. It may not cover all possible information. If you have questions about this medicine, talk to your doctor, pharmacist, or health care provider.  2018 Elsevier/Gold Standard (2015-09-01 12:05:46)

## 2018-07-16 NOTE — Progress Notes (Signed)
Follow Up   Subjective:    Patient ID: Brianna Velez, female    DOB: [birth year], 42 y.o.   MRN: [id number]  Chief Complaint  Patient presents with  . Follow-up    Abdominal pain   . Spasms    HPI Ms. Clodfelter has a past medical history of Sciatica, Migraine, Abdominal Pain, Leg Pain, and Fibroids. She is here today follow up.  Current Status: Since her last office visit, she has had follow up with GI. She is now scheduled for Colonoscopy on 08/21/2018. She continues to have increased flatulence, abdomina swelling, and belching. No  reports of GI problems such as nausea, vomiting, diarrhea, and constipation. She has no reports of blood in stools, dysuria and hematuria.  She denies fevers, chills, fatigue, recent infections, weight loss, and night sweats.   She has not had any headaches, visual changes, dizziness, and falls.  No chest pain, heart palpitations, cough and shortness of breath reported.   No depression or anxiety reported.   She denies pain today.   Past Medical History:  Diagnosis Date  . Fibroids   . History of hiatal hernia   . Leg pain   . Migraine   . Sciatica     Family History  Problem Relation Age of Onset  . Diabetes Sister   . Heart disease Paternal Grandmother   . Hypertension Father   . Dementia Mother   . Hypertension Mother     Social History   Socioeconomic History  . Marital status: Married    Spouse name: Not on file  . Number of children: Not on file  . Years of education: Not on file  . Highest education level: Not on file  Occupational History  . Not on file  Social Needs  . Financial resource strain: Not on file  . Food insecurity:    Worry: Not on file    Inability: Not on file  . Transportation needs:    Medical: Not on file    Non-medical: Not on file  Tobacco Use  . Smoking status: Former Smoker    Last attempt to quit: 12/05/1996    Years since quitting: 21.6  . Smokeless tobacco: Never Used  Substance and Sexual  Activity  . Alcohol use: No  . Drug use: No  . Sexual activity: Not on file  Lifestyle  . Physical activity:    Days per week: Not on file    Minutes per session: Not on file  . Stress: Not on file  Relationships  . Social connections:    Talks on phone: Not on file    Gets together: Not on file    Attends religious service: Not on file    Active member of club or organization: Not on file    Attends meetings of clubs or organizations: Not on file    Relationship status: Not on file  . Intimate partner violence:    Fear of current or ex partner: Not on file    Emotionally abused: Not on file    Physically abused: Not on file    Forced sexual activity: Not on file  Other Topics Concern  . Not on file  Social History Narrative  . Not on file    Past Surgical History:  Procedure Laterality Date  . ABDOMINAL HYSTERECTOMY  09/29/2015   Procedure: HYSTERECTOMY ABDOMINAL;  Surgeon: Mora Bellman, MD;  Location: [facility];  Service: Gynecology;;  . BILATERAL SALPINGECTOMY Bilateral 09/29/2015   Procedure: BILATERAL SALPINGECTOMY;  Surgeon: Mora Bellman, MD;  Location: [facility];  Service: Gynecology;  Laterality: Bilateral;  . TUBAL LIGATION    . WISDOM TOOTH EXTRACTION     Immunization History  Administered Date(s) Administered  . Influenza,inj,Quad PF,6+ Mos 08/01/2017, 12/09/2017    Current Meds  Medication Sig  . busPIRone (BUSPAR) 15 MG tablet Take 1 tablet (15 mg total) by mouth 2 (two) times daily.  . diphenhydrAMINE (BENADRYL) 25 MG tablet Take 25 mg by mouth every 6 (six) hours as needed.  . furosemide (LASIX) 20 MG tablet Take 1 tablet (20 mg total) by mouth daily as needed.  . gabapentin (NEURONTIN) 300 MG capsule Take 1 capsule (300 mg total) by mouth 3 (three) times daily.  . pantoprazole (PROTONIX) 20 MG tablet Take 1 tablet (20 mg total) by mouth daily.  . Prenatal Vit-Fe Fumarate-FA (PRENATAL MULTIVITAMIN) TABS tablet Take 1 tablet by mouth daily at 12 noon.     Allergies  Allergen Reactions  . Prednisone     Insomnia "it has me up all night"  . Hydrocodone Rash    BP 108/64 (BP Location: Left Arm, Patient Position: Sitting, Cuff Size: Small)   Pulse 88   Temp 98.1 F (36.7 C) (Oral)   Ht 5\' 4"  (1.626 m)   Wt 158 lb (71.7 kg)   LMP 08/14/2016   SpO2 98%   BMI 27.12 kg/m    Review of Systems  Constitutional: Negative.   HENT: Negative.   Eyes: Negative.   Respiratory: Negative.   Cardiovascular: Negative.   Gastrointestinal: Positive for abdominal distention and abdominal pain (occasional).       C/o abdominal swelling, belching, flatulence  Endocrine: Negative.   Genitourinary: Negative.   Musculoskeletal: Positive for arthralgias (muscle spasms legs, ankles).  Skin: Negative.   Allergic/Immunologic: Negative.   Hematological: Negative.   Psychiatric/Behavioral: Negative.    Objective:   Physical Exam  Constitutional: She is oriented to person, place, and time. She appears well-developed and well-nourished.  HENT:  Head: Normocephalic and atraumatic.  Right Ear: External ear normal.  Left Ear: External ear normal.  Nose: Nose normal.  Mouth/Throat: Oropharynx is clear and moist.  Eyes: Pupils are equal, round, and reactive to light. Conjunctivae and EOM are normal.  Neck: Normal range of motion. Neck supple.  Cardiovascular: Normal rate, regular rhythm and normal heart sounds.  Pulmonary/Chest: Effort normal and breath sounds normal.  Abdominal: Soft. Bowel sounds are normal.  Musculoskeletal: Normal range of motion.  Neurological: She is alert and oriented to person, place, and time.  Skin: Skin is warm and dry. Capillary refill takes less than 2 seconds.  Psychiatric: She has a normal mood and affect. Her behavior is normal. Judgment and thought content normal.  Nursing note reviewed.  Assessment & Plan:   1. Chronic abdominal pain She is currently followed by GI. She is scheduled for Colonoscopy on 08/21/2018  for further evaluation.  - POCT urinalysis dipstick - meloxicam (MOBIC) 15 MG tablet; Take 1 tablet (15 mg total) by mouth daily.  Dispense: 30 tablet; Refill: 2  2. Muscle spasms of both lower extremities We will initiate Flexeril today for muscle spasms.  - cyclobenzaprine (FLEXERIL) 10 MG tablet; Take 1 tablet (10 mg total) by mouth 3 (three) times daily as needed for muscle spasms.  Dispense: 30 tablet; Refill: 0  3. Abdominal distension (gaseous) Stable. Not worsening.   4. Follow up She will follow up in 2 months.   Meds ordered this encounter  Medications  .  cyclobenzaprine (FLEXERIL) 10 MG tablet    Sig: Take 1 tablet (10 mg total) by mouth 3 (three) times daily as needed for muscle spasms.    Dispense:  30 tablet    Refill:  0  . meloxicam (MOBIC) 15 MG tablet    Sig: Take 1 tablet (15 mg total) by mouth daily.    Dispense:  30 tablet    Refill:  [place],  MSN, FNP-C Patient [facility] [address] [place], [place] [address] [phone]

## 2018-07-30 ENCOUNTER — Emergency Department (HOSPITAL_BASED_OUTPATIENT_CLINIC_OR_DEPARTMENT_OTHER)
Admission: EM | Admit: 2018-07-30 | Discharge: 2018-07-31 | Disposition: A | Discharge status: Home / Self Care | Payer: Medicaid Other | Attending: Emergency Medicine | Admitting: Emergency Medicine

## 2018-07-30 ENCOUNTER — Encounter (HOSPITAL_BASED_OUTPATIENT_CLINIC_OR_DEPARTMENT_OTHER): Payer: Self-pay | Admitting: *Deleted

## 2018-07-30 ENCOUNTER — Other Ambulatory Visit: Payer: Self-pay

## 2018-07-30 ENCOUNTER — Emergency Department (HOSPITAL_BASED_OUTPATIENT_CLINIC_OR_DEPARTMENT_OTHER): Payer: Medicaid Other

## 2018-07-30 DIAGNOSIS — M25572 Pain in left ankle and joints of left foot: Secondary | ICD-10-CM | POA: Insufficient documentation

## 2018-07-30 DIAGNOSIS — Z79899 Other long term (current) drug therapy: Secondary | ICD-10-CM | POA: Insufficient documentation

## 2018-07-30 DIAGNOSIS — Z87891 Personal history of nicotine dependence: Secondary | ICD-10-CM | POA: Insufficient documentation

## 2018-07-30 DIAGNOSIS — M7989 Other specified soft tissue disorders: Secondary | ICD-10-CM | POA: Diagnosis not present

## 2018-07-30 NOTE — ED Triage Notes (Signed)
Pain and swelling in her left ankle for a week. She is ambulatory. WC offered.

## 2018-07-31 MED ORDER — IBUPROFEN 400 MG PO TABS
400.0000 mg | ORAL_TABLET | Freq: Once | ORAL | Status: AC | PRN
  Administered 2018-07-31: 400 mg via ORAL
  Filled 2018-07-31: qty 1

## 2018-07-31 NOTE — ED Provider Notes (Signed)
[facility] Provider Note: Georgena Spurling, MD, FACEP  CSN: [id number] MRN: [id number] ARRIVAL: 07/30/18 at 2219 ROOM: [place]  Ankle Pain   HISTORY OF PRESENT ILLNESS  07/31/18 1:44 AM Brianna Velez is a 42 y.o. female who is on her feet a lot at work.  She is here with a one-week history of pain in her left ankle.  The pain is around the lateral malleolus and is associated with some mild swelling.  She rates her pain as a 9 out of 10.  She has been taking ibuprofen without adequate relief.  She denies any specific injury or event that precipitated the pain.   Past Medical History:  Diagnosis Date  . Fibroids   . History of hiatal hernia   . Leg pain   . Migraine   . Sciatica     Past Surgical History:  Procedure Laterality Date  . ABDOMINAL HYSTERECTOMY  09/29/2015   Procedure: HYSTERECTOMY ABDOMINAL;  Surgeon: Mora Bellman, MD;  Location: [facility];  Service: Gynecology;;  . BILATERAL SALPINGECTOMY Bilateral 09/29/2015   Procedure: BILATERAL SALPINGECTOMY;  Surgeon: Mora Bellman, MD;  Location: [facility];  Service: Gynecology;  Laterality: Bilateral;  . TUBAL LIGATION    . WISDOM TOOTH EXTRACTION      Family History  Problem Relation Age of Onset  . Diabetes Sister   . Heart disease Paternal Grandmother   . Hypertension Father   . Dementia Mother   . Hypertension Mother     Social History   Tobacco Use  . Smoking status: Former Smoker    Last attempt to quit: 12/05/1996    Years since quitting: 21.6  . Smokeless tobacco: Never Used  Substance Use Topics  . Alcohol use: No  . Drug use: No    Prior to Admission medications   Medication Sig Start Date End Date Taking? Authorizing Provider  busPIRone (BUSPAR) 15 MG tablet Take 1 tablet (15 mg total) by mouth 2 (two) times daily. 04/13/18  Yes Dorena Dew, FNP  cyclobenzaprine (FLEXERIL) 10 MG tablet Take 1 tablet (10 mg total) by mouth 3 (three) times daily as needed for  muscle spasms. 07/16/18  Yes Azzie Glatter, FNP  diphenhydrAMINE (BENADRYL) 25 MG tablet Take 25 mg by mouth every 6 (six) hours as needed.   Yes [provider]  furosemide (LASIX) 20 MG tablet Take 1 tablet (20 mg total) by mouth daily as needed. 02/19/18  Yes Scot Jun, FNP  gabapentin (NEURONTIN) 300 MG capsule Take 1 capsule (300 mg total) by mouth 3 (three) times daily. 05/14/18  Yes Azzie Glatter, FNP  meloxicam (MOBIC) 15 MG tablet Take 1 tablet (15 mg total) by mouth daily. 07/16/18  Yes Azzie Glatter, FNP  pantoprazole (PROTONIX) 20 MG tablet Take 1 tablet (20 mg total) by mouth daily. 07/10/18  Yes Azzie Glatter, FNP  Prenatal Vit-Fe Fumarate-FA (PRENATAL MULTIVITAMIN) TABS tablet Take 1 tablet by mouth daily at 12 noon.   Yes [provider]    Allergies Prednisone and Hydrocodone   REVIEW OF SYSTEMS  Negative except as noted here or in the History of Present Illness.   PHYSICAL EXAMINATION  Initial Vital Signs Blood pressure 111/65, pulse 86, resp. rate 16, height 5\' 4"  (1.626 m), weight 71.7 kg, last menstrual period 08/14/2016, SpO2 100 %.  Examination General: Well-developed, well-nourished female in no acute distress; appearance consistent with age of record HENT: normocephalic; atraumatic Eyes: pupils equal, round  and reactive to light; extraocular muscles intact Neck: supple Heart: regular rate and rhythm Lungs: clear to auscultation bilaterally Abdomen: soft; nondistended; nontender; bowel sounds present Extremities: No deformity; pulses normal; trace edema of ankles bilaterally; tenderness surrounding the left lateral malleolus without ecchymosis or erythema Neurologic: Awake, alert and oriented; motor function intact in all extremities and symmetric; no facial droop Skin: Warm and dry Psychiatric: Normal mood and affect   RESULTS  Summary of this visit's results, reviewed by myself:   EKG Interpretation  Date/Time:      Ventricular Rate:    PR Interval:    QRS Duration:   QT Interval:    QTC Calculation:   R Axis:     Text Interpretation:        Laboratory Studies: No results found for this or any previous visit (from the past 24 hour(s)). Imaging Studies: Dg Ankle Complete Left  Result Date: 07/30/2018 CLINICAL DATA:  Lateral ankle swelling and pain without known injury. EXAM: LEFT ANKLE COMPLETE - 3+ VIEW COMPARISON:  None. FINDINGS: There is no evidence of fracture, dislocation, or joint effusion. There is no evidence of arthropathy or other focal bone abnormality. Mild lateral soft tissue swelling. IMPRESSION: No fracture or dislocation of the left ankle. Electronically Signed   By: Ulyses Jarred M.D.   On: 07/30/2018 23:00    ED COURSE and MDM  Nursing notes and initial vitals signs, including pulse oximetry, reviewed.  Vitals:   07/30/18 2228 07/30/18 2230 07/31/18 0112  BP:  123/84 111/65  Pulse:  86 86  Resp:  18 16  SpO2:  100% 100%  Weight: 71.6 kg 71.7 kg   Height: 5\' 4"  (1.626 m) 5\' 4"  (1.626 m)    X-ray negative for acute fracture.  I suspect this represents the repetitive use injury.  We will place her in an ASO and treat with Mobic.  We will refer to sports medicine if symptoms persist.  PROCEDURES    ED DIAGNOSES     ICD-10-CM   1. Acute left ankle pain M25.572        Keymoni Mccaster, Jenny Reichmann, MD 07/31/18 [phone]

## 2018-08-21 ENCOUNTER — Encounter: Payer: Medicaid Other | Admitting: Gastroenterology

## 2018-08-22 MED FILL — busPIRone HCL 15 MG TABS: 15 | 30 days supply | Qty: 60 | Fill #0

## 2018-09-09 ENCOUNTER — Other Ambulatory Visit: Payer: Self-pay | Admitting: Family Medicine

## 2018-09-09 DIAGNOSIS — R140 Abdominal distension (gaseous): Secondary | ICD-10-CM

## 2018-09-13 ENCOUNTER — Telehealth: Payer: Self-pay

## 2018-09-13 NOTE — Telephone Encounter (Signed)
Patient states that she will make appointment on 10/14 at 1120am.

## 2018-09-17 ENCOUNTER — Encounter: Payer: Self-pay | Admitting: Family Medicine

## 2018-09-17 ENCOUNTER — Ambulatory Visit (INDEPENDENT_AMBULATORY_CARE_PROVIDER_SITE_OTHER): Payer: Medicaid Other | Admitting: Family Medicine

## 2018-09-17 VITALS — BP 112/76 | HR 78 | Temp 98.0°F | Ht 64.0 in | Wt 159.0 lb

## 2018-09-17 DIAGNOSIS — G8929 Other chronic pain: Secondary | ICD-10-CM

## 2018-09-17 DIAGNOSIS — Z23 Encounter for immunization: Secondary | ICD-10-CM | POA: Diagnosis not present

## 2018-09-17 DIAGNOSIS — Z09 Encounter for follow-up examination after completed treatment for conditions other than malignant neoplasm: Secondary | ICD-10-CM

## 2018-09-17 DIAGNOSIS — R8290 Unspecified abnormal findings in urine: Secondary | ICD-10-CM

## 2018-09-17 DIAGNOSIS — R140 Abdominal distension (gaseous): Secondary | ICD-10-CM | POA: Diagnosis not present

## 2018-09-17 DIAGNOSIS — F419 Anxiety disorder, unspecified: Secondary | ICD-10-CM

## 2018-09-17 DIAGNOSIS — R109 Unspecified abdominal pain: Secondary | ICD-10-CM

## 2018-09-17 LAB — POCT URINALYSIS DIP (MANUAL ENTRY)
Bilirubin, UA: NEGATIVE
Blood, UA: NEGATIVE
Glucose, UA: NEGATIVE mg/dL
Ketones, POC UA: NEGATIVE mg/dL
Nitrite, UA: NEGATIVE
Spec Grav, UA: 1.015 (ref 1.010–1.025)
Urobilinogen, UA: 2 E.U./dL — AB
pH, UA: 7.5 (ref 5.0–8.0)

## 2018-09-17 MED ORDER — BUSPIRONE HCL 10 MG PO TABS: 10.0000 mg | ORAL_TABLET | Freq: Two times a day (BID) | ORAL | 3 refills | Status: DC

## 2018-09-17 NOTE — Patient Instructions (Signed)
Buspirone tablets What is this medicine? BUSPIRONE (byoo SPYE rone) is used to treat anxiety disorders. This medicine may be used for other purposes; ask your health care provider or pharmacist if you have questions. COMMON BRAND NAME(S): BuSpar What should I tell my health care provider before I take this medicine? They need to know if you have any of these conditions: -kidney or liver disease -an unusual or allergic reaction to buspirone, other medicines, foods, dyes, or preservatives -pregnant or trying to get pregnant -breast-feeding How should I use this medicine? Take this medicine by mouth with a glass of water. Follow the directions on the prescription label. You may take this medicine with or without food. To ensure that this medicine always works the same way for you, you should take it either always with or always without food. Take your doses at regular intervals. Do not take your medicine more often than directed. Do not stop taking except on the advice of your doctor or health care professional. Talk to your pediatrician regarding the use of this medicine in children. Special care may be needed. Overdosage: If you think you have taken too much of this medicine contact a poison control center or emergency room at once. NOTE: This medicine is only for you. Do not share this medicine with others. What if I miss a dose? If you miss a dose, take it as soon as you can. If it is almost time for your next dose, take only that dose. Do not take double or extra doses. What may interact with this medicine? Do not take this medicine with any of the following medications: -linezolid -MAOIs like Carbex, Eldepryl, Marplan, Nardil, and Parnate -methylene blue -procarbazine This medicine may also interact with the following medications: -diazepam -digoxin -diltiazem -erythromycin -grapefruit juice -haloperidol -medicines for mental depression or mood problems -medicines for seizures like  carbamazepine, phenobarbital and phenytoin -nefazodone -other medications for anxiety -rifampin -ritonavir -some antifungal medicines like itraconazole, ketoconazole, and voriconazole -verapamil -warfarin This list may not describe all possible interactions. Give your health care provider a list of all the medicines, herbs, non-prescription drugs, or dietary supplements you use. Also tell them if you smoke, drink alcohol, or use illegal drugs. Some items may interact with your medicine. What should I watch for while using this medicine? Visit your doctor or health care professional for regular checks on your progress. It may take 1 to 2 weeks before your anxiety gets better. You may get drowsy or dizzy. Do not drive, use machinery, or do anything that needs mental alertness until you know how this drug affects you. Do not stand or sit up quickly, especially if you are an older patient. This reduces the risk of dizzy or fainting spells. Alcohol can make you more drowsy and dizzy. Avoid alcoholic drinks. What side effects may I notice from receiving this medicine? Side effects that you should report to your doctor or health care professional as soon as possible: -blurred vision or other vision changes -chest pain -confusion -difficulty breathing -feelings of hostility or anger -muscle aches and pains -numbness or tingling in hands or feet -ringing in the ears -skin rash and itching -vomiting -weakness Side effects that usually do not require medical attention (report to your doctor or health care professional if they continue or are bothersome): -disturbed dreams, nightmares -headache -nausea -restlessness or nervousness -sore throat and nasal congestion -stomach upset This list may not describe all possible side effects. Call your doctor for medical advice about side   effects. You may report side effects to FDA at 1-800-FDA-1088. Where should I keep my medicine? Keep out of the reach  of children. Store at room temperature below 30 degrees C (86 degrees F). Protect from light. Keep container tightly closed. Throw away any unused medicine after the expiration date. NOTE: This sheet is a summary. It may not cover all possible information. If you have questions about this medicine, talk to your doctor, pharmacist, or health care provider.  2018 Elsevier/Gold Standard (2010-07-01 18:06:11)  

## 2018-09-17 NOTE — Progress Notes (Signed)
Follow Up  Subjective:    Patient ID: Brianna Velez, female    DOB: [birth year], 42 y.o.   MRN: [id number]  Chief Complaint  Patient presents with  . Follow-up    Chronic condition   HPI  Brianna Velez is a 42 year female with a past medicEndoscopy and Colonoscopy rescheduled to 10/16/2018. She did not have procedure done at previously scheduled appointment.   Since her last office visit, she has had follow up with GI. She continues to have increased flatulence, abdomina swelling, and belching. No  reports of GI problems such as nausea, vomiting, diarrhea, and constipation. She has no reports of blood in stools, dysuria and hematuria.  She states that Buspar was increased to 15 mg and it is causing her to have headaches.   Past Medical History:  Diagnosis Date  . Fibroids   . History of hiatal hernia   . Leg pain   . Migraine   . Sciatica     Family History  Problem Relation Age of Onset  . Diabetes Sister   . Heart disease Paternal Grandmother   . Hypertension Father   . Dementia Mother   . Hypertension Mother     Social History   Socioeconomic History  . Marital status: Married    Spouse name: Not on file  . Number of children: Not on file  . Years of education: Not on file  . Highest education level: Not on file  Occupational History  . Not on file  Social Needs  . Financial resource strain: Not on file  . Food insecurity:    Worry: Not on file    Inability: Not on file  . Transportation needs:    Medical: Not on file    Non-medical: Not on file  Tobacco Use  . Smoking status: Former Smoker    Last attempt to quit: 12/05/1996    Years since quitting: 21.7  . Smokeless tobacco: Never Used  Substance and Sexual Activity  . Alcohol use: No  . Drug use: No  . Sexual activity: Not on file  Lifestyle  . Physical activity:    Days per week: Not on file    Minutes per session: Not on file  . Stress: Not on file  Relationships  . Social connections:    Talks  on phone: Not on file    Gets together: Not on file    Attends religious service: Not on file    Active member of club or organization: Not on file    Attends meetings of clubs or organizations: Not on file    Relationship status: Not on file  . Intimate partner violence:    Fear of current or ex partner: Not on file    Emotionally abused: Not on file    Physically abused: Not on file    Forced sexual activity: Not on file  Other Topics Concern  . Not on file  Social History Narrative  . Not on file    Past Surgical History:  Procedure Laterality Date  . ABDOMINAL HYSTERECTOMY  09/29/2015   Procedure: HYSTERECTOMY ABDOMINAL;  Surgeon: Mora Bellman, MD;  Location: [facility];  Service: Gynecology;;  . BILATERAL SALPINGECTOMY Bilateral 09/29/2015   Procedure: BILATERAL SALPINGECTOMY;  Surgeon: Mora Bellman, MD;  Location: [facility];  Service: Gynecology;  Laterality: Bilateral;  . TUBAL LIGATION    . WISDOM TOOTH EXTRACTION      Immunization History  Administered Date(s) Administered  . Influenza,inj,Quad PF,6+ Mos 08/01/2017, 12/09/2017,  09/17/2018    Current Meds  Medication Sig  . cyclobenzaprine (FLEXERIL) 10 MG tablet Take 1 tablet (10 mg total) by mouth 3 (three) times daily as needed for muscle spasms.  . diphenhydrAMINE (BENADRYL) 25 MG tablet Take 25 mg by mouth every 6 (six) hours as needed.  . furosemide (LASIX) 20 MG tablet Take 1 tablet (20 mg total) by mouth daily as needed.  . gabapentin (NEURONTIN) 300 MG capsule Take 1 capsule (300 mg total) by mouth 3 (three) times daily.  . meloxicam (MOBIC) 15 MG tablet Take 1 tablet (15 mg total) by mouth daily.  . pantoprazole (PROTONIX) 20 MG tablet Take 1 tablet (20 mg total) by mouth daily.  . Prenatal Vit-Fe Fumarate-FA (PRENATAL MULTIVITAMIN) TABS tablet Take 1 tablet by mouth daily at 12 noon.  . [DISCONTINUED] busPIRone (BUSPAR) 15 MG tablet Take 1 tablet (15 mg total) by mouth 2 (two) times daily.    Allergies   Allergen Reactions  . Prednisone     Insomnia "it has me up all night"  . Hydrocodone Rash   BP 112/76 (BP Location: Left Arm, Patient Position: Sitting, Cuff Size: Small)   Pulse 78   Temp 98 F (36.7 C) (Oral)   Ht 5\' 4"  (1.626 m)   Wt 159 lb (72.1 kg)   LMP 08/14/2016   SpO2 100%   BMI 27.29 kg/m    Review of Systems  Respiratory: Negative.   Cardiovascular: Negative.   Gastrointestinal: Positive for abdominal distention (Obese) and constipation.       Bloating, Flatulence, Belching  Genitourinary: Negative.   Musculoskeletal: Negative.   Skin: Negative.   Neurological: Negative.   Psychiatric/Behavioral: Negative.    Objective:   Physical Exam  Constitutional: She is oriented to person, place, and time. She appears well-developed and well-nourished.  HENT:  Head: Normocephalic and atraumatic.  Eyes: Pupils are equal, round, and reactive to light. EOM are normal.  Neck: Normal range of motion. Neck supple.  Cardiovascular: Normal rate, regular rhythm, normal heart sounds and intact distal pulses.  Pulmonary/Chest: Effort normal and breath sounds normal.  Abdominal: Soft. Bowel sounds are normal.  Musculoskeletal: Normal range of motion.  Neurological: She is alert and oriented to person, place, and time.  Skin: Skin is warm and dry.  Psychiatric: She has a normal mood and affect. Her behavior is normal. Judgment and thought content normal.  Nursing note and vitals reviewed.  Assessment & Plan:   1. Abdominal distension (gaseous)  2. Chronic abdominal pain Stable today. She will keep follow up appointment scheduled for Colonoscopy.   3. Anxiety We will decrease Buspar to 10 mg BID.  - busPIRone (BUSPAR) 10 MG tablet; Take 1 tablet (10 mg total) by mouth 2 (two) times daily.  Dispense: 60 tablet; Refill: 3  4. Abnormal urine - Urine Culture  5. Need for immunization against influenza - Flu Vaccine QUAD 36+ mos IM  6. Follow up She will follow up in 3  months.  - POCT urinalysis dipstick  Meds ordered this encounter  Medications  . busPIRone (BUSPAR) 10 MG tablet    Sig: Take 1 tablet (10 mg total) by mouth 2 (two) times daily.    Dispense:  60 tablet    Refill:  [place],  MSN, [facility] Patient Brianna Velez [address] [place], [place] [address] [phone]

## 2018-09-19 LAB — URINE CULTURE

## 2018-10-16 ENCOUNTER — Ambulatory Visit (AMBULATORY_SURGERY_CENTER): Payer: Medicaid Other | Admitting: Gastroenterology

## 2018-10-16 ENCOUNTER — Encounter: Payer: Self-pay | Admitting: Gastroenterology

## 2018-10-16 VITALS — BP 93/59 | HR 91 | Temp 97.3°F | Resp 16 | Ht 64.0 in | Wt 159.0 lb

## 2018-10-16 DIAGNOSIS — K2980 Duodenitis without bleeding: Secondary | ICD-10-CM

## 2018-10-16 DIAGNOSIS — R109 Unspecified abdominal pain: Secondary | ICD-10-CM

## 2018-10-16 DIAGNOSIS — K529 Noninfective gastroenteritis and colitis, unspecified: Secondary | ICD-10-CM | POA: Diagnosis not present

## 2018-10-16 DIAGNOSIS — R140 Abdominal distension (gaseous): Secondary | ICD-10-CM | POA: Diagnosis not present

## 2018-10-16 DIAGNOSIS — G8929 Other chronic pain: Secondary | ICD-10-CM

## 2018-10-16 DIAGNOSIS — K269 Duodenal ulcer, unspecified as acute or chronic, without hemorrhage or perforation: Secondary | ICD-10-CM | POA: Diagnosis not present

## 2018-10-16 MED ORDER — SODIUM CHLORIDE 0.9 % IV SOLN: 500.0000 mL | Freq: Once | INTRAVENOUS | Status: DC

## 2018-10-16 NOTE — Progress Notes (Signed)
Called to room to assist during endoscopic procedure.  Patient ID and intended procedure confirmed with present staff. Received instructions for my participation in the procedure from the performing physician.  

## 2018-10-16 NOTE — Patient Instructions (Signed)
**   Discontinue aspirin-containing medicines - Goody Powder **   YOU HAD AN ENDOSCOPIC PROCEDURE TODAY AT THE [facility]:   Refer to the procedure report that was given to you for any specific questions about what was found during the examination.  If the procedure report does not answer your questions, please call your gastroenterologist to clarify.  If you requested that your care partner not be given the details of your procedure findings, then the procedure report has been included in a sealed envelope for you to review at your convenience later.  YOU SHOULD EXPECT: Some feelings of bloating in the abdomen. Passage of more gas than usual.  Walking can help get rid of the air that was put into your GI tract during the procedure and reduce the bloating. If you had a lower endoscopy (such as a colonoscopy or flexible sigmoidoscopy) you may notice spotting of blood in your stool or on the toilet paper. If you underwent a bowel prep for your procedure, you may not have a normal bowel movement for a few days.  Please Note:  You might notice some irritation and congestion in your nose or some drainage.  This is from the oxygen used during your procedure.  There is no need for concern and it should clear up in a day or so.  SYMPTOMS TO REPORT IMMEDIATELY:   Following lower endoscopy (colonoscopy or flexible sigmoidoscopy):  Excessive amounts of blood in the stool  Significant tenderness or worsening of abdominal pains  Swelling of the abdomen that is new, acute  Fever of 100F or higher   Following upper endoscopy (EGD)  Vomiting of blood or coffee ground material  New chest pain or pain under the shoulder blades  Painful or persistently difficult swallowing  New shortness of breath  Fever of 100F or higher  Black, tarry-looking stools  For urgent or emergent issues, a gastroenterologist can be reached at any hour by calling [phone].   DIET:  We do recommend a small meal  at first, but then you may proceed to your regular diet.  Drink plenty of fluids but you should avoid alcoholic beverages for 24 hours.  ACTIVITY:  You should plan to take it easy for the rest of today and you should NOT DRIVE or use heavy machinery until tomorrow (because of the sedation medicines used during the test).    FOLLOW UP: Our staff will call the number listed on your records the next business day following your procedure to check on you and address any questions or concerns that you may have regarding the information given to you following your procedure. If we do not reach you, we will leave a message.  However, if you are feeling well and you are not experiencing any problems, there is no need to return our call.  We will assume that you have returned to your regular daily activities without incident.  If any biopsies were taken you will be contacted by phone or by letter within the next 1-3 weeks.  Please call us at [phone] if you have not heard about the biopsies in 3 weeks.    SIGNATURES/CONFIDENTIALITY: You and/or your care partner have signed paperwork which will be entered into your electronic medical record.  These signatures attest to the fact that that the information above on your After Visit Summary has been reviewed and is understood.  Full responsibility of the confidentiality of this discharge information lies with you and/or your care-partner.

## 2018-10-16 NOTE — Op Note (Signed)
[facility] Patient Name: Brianna Velez Procedure Date: 10/16/2018 11:55 AM MRN: [id number] Endoscopist: [place]. Loletha Carrow , MD Age: 42 Referring MD:  Date of Birth: 10-07-1976 Gender: Female Account #: [id number] Procedure:                Upper GI endoscopy Indications:              Abdominal distention, Abdominal bloating, Diarrhea                            and constipation Medicines:                Monitored Anesthesia Care Procedure:                Pre-Anesthesia Assessment:                           - Prior to the procedure, a History and Physical                            was performed, and patient medications and                            allergies were reviewed. The patient's tolerance of                            previous anesthesia was also reviewed. The risks                            and benefits of the procedure and the sedation                            options and risks were discussed with the patient.                            All questions were answered, and informed consent                            was obtained. Prior Anticoagulants: The patient has                            taken no previous anticoagulant or antiplatelet                            agents. ASA Grade Assessment: II - A patient with                            mild systemic disease. After reviewing the risks                            and benefits, the patient was deemed in                            satisfactory condition to undergo the procedure.  After obtaining informed consent, the endoscope was                            passed under direct vision. Throughout the                            procedure, the patient's blood pressure, pulse, and                            oxygen saturations were monitored continuously. The                            Endoscope was introduced through the mouth, and                            advanced to the second part of  duodenum. The upper                            GI endoscopy was accomplished without difficulty.                            The patient tolerated the procedure well. Scope In: Scope Out: Findings:                 The larynx was normal.                           The esophagus was normal.                           Patchy hemorrhagic mucosa was found in the gastric                            body. Biopsies were taken with a cold forceps for                            histology. (Sidney protocol).                           The exam of the stomach was otherwise normal,                            including on retroflexion.                           Few superficial duodenal ulcers and erosions with                            no stigmata of bleeding were found in the duodenal                            bulb. The largest lesion was 5 mm in largest                            dimension. Six biopsies were taken  from the second                            portion and duodenal sweep with a cold forceps for                            histology (rule out celiac sprue).                           The exam of the duodenum was otherwise normal. Complications:            No immediate complications. Estimated Blood Loss:     Estimated blood loss was minimal. Impression:               - Normal larynx.                           - Normal esophagus.                           - Hemorrhagic gastropathy. Biopsied.                           - Multiple duodenal ulcers with no stigmata of                            bleeding. Biopsied. Recommendation:           - Patient has a contact number available for                            emergencies. The signs and symptoms of potential                            delayed complications were discussed with the                            patient. Return to normal activities tomorrow.                            Written discharge instructions were provided to the                             patient.                           - Resume previous diet.                           - Continue present medications, but discontinue use                            of aspirin-containing medicines (i.e. Goody Powder).                           - Await pathology results.                           -  See the other procedure note for documentation of                            additional recommendations. Henry L. Loletha Carrow, MD 10/16/2018 12:35:08 PM This report has been signed electronically.

## 2018-10-16 NOTE — Op Note (Signed)
[facility] Patient Name: Brianna Velez Procedure Date: 10/16/2018 11:54 AM MRN: [id number] Endoscopist: [place]. Loletha Carrow , MD Age: 42 Referring MD:  Date of Birth: Dec 15, 1975 Gender: Female Account #: [id number] Procedure:                Colonoscopy Indications:              Generalized abdominal pain, Chronic diarrhea and                            constipation (alternating), bloating and abdominal                            distention Medicines:                Monitored Anesthesia Care Procedure:                Pre-Anesthesia Assessment:                           - Prior to the procedure, a History and Physical                            was performed, and patient medications and                            allergies were reviewed. The patient's tolerance of                            previous anesthesia was also reviewed. The risks                            and benefits of the procedure and the sedation                            options and risks were discussed with the patient.                            All questions were answered, and informed consent                            was obtained. Prior Anticoagulants: The patient has                            taken no previous anticoagulant or antiplatelet                            agents. ASA Grade Assessment: II - A patient with                            mild systemic disease. After reviewing the risks                            and benefits, the patient was deemed in  satisfactory condition to undergo the procedure.                           After obtaining informed consent, the colonoscope                            was passed under direct vision. Throughout the                            procedure, the patient's blood pressure, pulse, and                            oxygen saturations were monitored continuously. The                            Colonoscope was introduced through the anus and                             advanced to the the terminal ileum, with                            identification of the appendiceal orifice and IC                            valve. The colonoscopy was performed without                            difficulty. The patient tolerated the procedure                            well. The quality of the bowel preparation was                            good. The terminal ileum, ileocecal valve,                            appendiceal orifice, and rectum were photographed.                            The quality of the bowel preparation was evaluated                            using the BBPS [facility] Bowel Preparation Scale)                            with scores of: Right Colon = 2, Transverse Colon =                            2 and Left Colon = 2. The total BBPS score equals 6. Scope In: 12:15:17 PM Scope Out: 12:27:17 PM Scope Withdrawal Time: 0 hours 8 minutes 13 seconds  Total Procedure Duration: 0 hours 12 minutes 0 seconds  Findings:                 The  perianal and digital rectal examinations were                            normal.                           The terminal ileum appeared normal.                           Normal mucosa was found in the entire colon.                            Biopsies for histology were taken with a cold                            forceps from the right colon and left colon for                            evaluation of microscopic colitis. (all biopsies                            sent together for pathology)                           Retroflexion in the rectum was not performed due to                            anatomy. Complications:            No immediate complications. Estimated Blood Loss:     Estimated blood loss was minimal. Impression:               - The examined portion of the ileum was normal.                           - Normal mucosa in the entire examined colon.                             Biopsied. Recommendation:           - Patient has a contact number available for                            emergencies. The signs and symptoms of potential                            delayed complications were discussed with the                            patient. Return to normal activities tomorrow.                            Written discharge instructions were provided to the                            patient.                           -  Resume previous diet.                           - Continue present medications.                           - Await pathology results.                           - Repeat colonoscopy in 10 years for screening                            purposes.                           - See the other procedure note for documentation of                            additional recommendations. [name] L. Loletha Carrow, MD 10/16/2018 12:39:02 PM This report has been signed electronically.

## 2018-10-16 NOTE — Progress Notes (Signed)
A/ox3, pleased with MAC, report to RN 

## 2018-10-17 ENCOUNTER — Other Ambulatory Visit: Payer: Self-pay | Admitting: Family Medicine

## 2018-10-17 ENCOUNTER — Telehealth: Payer: Self-pay | Admitting: *Deleted

## 2018-10-17 DIAGNOSIS — R109 Unspecified abdominal pain: Secondary | ICD-10-CM

## 2018-10-17 DIAGNOSIS — R140 Abdominal distension (gaseous): Secondary | ICD-10-CM

## 2018-10-17 DIAGNOSIS — G8929 Other chronic pain: Secondary | ICD-10-CM

## 2018-10-17 NOTE — Telephone Encounter (Signed)
  Follow up Call-  Call back number 10/16/2018  Post procedure Call Back phone  # [phone]  Permission to leave phone message Yes  Some recent data might be hidden     Patient questions:  Do you have a fever, pain , or abdominal swelling? No. Pain Score  0 *  Have you tolerated food without any problems? Yes  Have you been able to return to your normal activities? Yes.    Do you have any questions about your discharge instructions: Diet   No. Medications  No. Follow up visit  No.  Do you have questions or concerns about your Care? No.  Actions: * If pain score is 4 or above: No action needed, pain <4.

## 2018-10-18 ENCOUNTER — Other Ambulatory Visit: Payer: Self-pay

## 2018-10-18 DIAGNOSIS — R109 Unspecified abdominal pain: Principal | ICD-10-CM

## 2018-10-18 DIAGNOSIS — G8929 Other chronic pain: Secondary | ICD-10-CM

## 2018-10-18 DIAGNOSIS — R140 Abdominal distension (gaseous): Secondary | ICD-10-CM

## 2018-10-18 MED ORDER — PANTOPRAZOLE SODIUM 20 MG PO TBEC: 20.0000 mg | DELAYED_RELEASE_TABLET | Freq: Every day | ORAL | 1 refills | Status: DC

## 2018-10-18 MED ORDER — MELOXICAM 15 MG PO TABS: 15.0000 mg | ORAL_TABLET | Freq: Every day | ORAL | 2 refills | Status: DC

## 2018-10-18 NOTE — Telephone Encounter (Signed)
Medication refill

## 2018-10-22 ENCOUNTER — Other Ambulatory Visit: Payer: Self-pay

## 2018-10-22 ENCOUNTER — Telehealth: Payer: Self-pay | Admitting: Gastroenterology

## 2018-10-22 MED ORDER — RIFAXIMIN 550 MG PO TABS: 550.0000 mg | ORAL_TABLET | Freq: Three times a day (TID) | ORAL | 0 refills | Status: DC

## 2018-10-30 ENCOUNTER — Telehealth: Payer: Self-pay

## 2018-10-30 NOTE — Telephone Encounter (Signed)
Received fax from [facility] that PA has been approved for Xifaxan and they will contact patient for delivery.

## 2018-12-03 ENCOUNTER — Telehealth: Payer: Self-pay | Admitting: Gastroenterology

## 2018-12-03 NOTE — Telephone Encounter (Signed)
Left message for pt to call back  °

## 2018-12-03 NOTE — Telephone Encounter (Signed)
Pt states that she is allergic to xifaxan and wants to know if she can be prescribed somehing different.

## 2018-12-04 NOTE — Telephone Encounter (Signed)
Left message for pt to call back  °

## 2018-12-07 NOTE — Telephone Encounter (Signed)
Pt has not returned call after multiple messages left for pt to call back.

## 2018-12-18 ENCOUNTER — Ambulatory Visit: Payer: Medicaid Other | Admitting: Family Medicine

## 2019-01-03 ENCOUNTER — Telehealth: Payer: Self-pay

## 2019-01-03 ENCOUNTER — Telehealth: Payer: Self-pay | Admitting: Gastroenterology

## 2019-01-03 DIAGNOSIS — M25562 Pain in left knee: Principal | ICD-10-CM

## 2019-01-03 DIAGNOSIS — M25561 Pain in right knee: Principal | ICD-10-CM

## 2019-01-03 DIAGNOSIS — G8929 Other chronic pain: Secondary | ICD-10-CM

## 2019-01-03 MED ORDER — METRONIDAZOLE 500 MG PO TABS: 500.0000 mg | ORAL_TABLET | Freq: Three times a day (TID) | ORAL | 0 refills | Status: AC

## 2019-01-03 MED ORDER — GABAPENTIN 300 MG PO CAPS: 300.0000 mg | ORAL_CAPSULE | Freq: Three times a day (TID) | ORAL | 0 refills | Status: DC

## 2019-01-03 NOTE — Telephone Encounter (Signed)
I prescribed 10 days of metronidazole.  It does not typically work as well as the rifaximin, but only alternative.

## 2019-01-03 NOTE — Telephone Encounter (Signed)
Medication sent for 30 days and pharmacy notified that patient needs appointment

## 2019-01-03 NOTE — Telephone Encounter (Signed)
Pt called to let us know that she stopped the xifaxan on 12/04/19, states she broke out in a rash and was itching. Attempted to call pt back and left multiple messages, pt never returned the call. Pt calling back today to see if there is something else she can take instead of the xifaxan, since she stopped it the rash and itching are gone. Please advise.

## 2019-01-04 NOTE — Telephone Encounter (Signed)
Pt aware.

## 2019-01-07 MED FILL — busPIRone HCL 15 MG TABS: 15 | 30 days supply | Qty: 60 | Fill #1

## 2019-01-07 MED FILL — PANTOPRAZOLE SOD DR 20 MG T: 20 | 30 days supply | Qty: 30 | Fill #1

## 2019-01-16 MED FILL — PANTOPRAZOLE SOD DR 20 MG T: 20 | 30 days supply | Qty: 30 | Fill #1

## 2019-01-16 MED FILL — busPIRone HCL 15 MG TABS: 15 | 30 days supply | Qty: 60 | Fill #1

## 2019-01-26 ENCOUNTER — Other Ambulatory Visit: Payer: Self-pay | Admitting: Family Medicine

## 2019-01-26 DIAGNOSIS — R109 Unspecified abdominal pain: Principal | ICD-10-CM

## 2019-01-26 DIAGNOSIS — G8929 Other chronic pain: Secondary | ICD-10-CM

## 2019-01-30 ENCOUNTER — Other Ambulatory Visit: Payer: Self-pay | Admitting: Family Medicine

## 2019-01-30 DIAGNOSIS — G8929 Other chronic pain: Secondary | ICD-10-CM

## 2019-01-30 DIAGNOSIS — M25561 Pain in right knee: Principal | ICD-10-CM

## 2019-01-30 DIAGNOSIS — M25562 Pain in left knee: Principal | ICD-10-CM

## 2019-02-15 ENCOUNTER — Encounter (HOSPITAL_COMMUNITY): Payer: Self-pay | Admitting: Emergency Medicine

## 2019-02-15 ENCOUNTER — Other Ambulatory Visit: Payer: Self-pay

## 2019-02-15 ENCOUNTER — Ambulatory Visit (HOSPITAL_COMMUNITY)
Admission: EM | Admit: 2019-02-15 | Discharge: 2019-02-15 | Disposition: A | Discharge status: Home / Self Care | Payer: Medicaid Other | Attending: Family Medicine | Admitting: Family Medicine

## 2019-02-15 DIAGNOSIS — R21 Rash and other nonspecific skin eruption: Secondary | ICD-10-CM

## 2019-02-15 MED ORDER — TRIAMCINOLONE ACETONIDE 0.1 % EX CREA: 1.0000 "application " | TOPICAL_CREAM | Freq: Two times a day (BID) | CUTANEOUS | 0 refills | Status: DC

## 2019-02-15 NOTE — Discharge Instructions (Addendum)
I believe this is bites from bed bugs.  I will give you some steroid cream to help with the inflammation.  Benadryl for itching.  Try to remove yourself from the situation or you will keep getting re bitten.  Follow up as needed for continued or worsening symptoms

## 2019-02-15 NOTE — ED Triage Notes (Signed)
PT C/O: rash on bilateral arms, neck, back onset 5 days  Staying at sisters home and sister's home has been exposed to bed bugs.   DENIES: new meds/foods/hygiene products  TAKING MEDS: Benadryl, calamine lotion   A&O x4... NAD... Ambulatory

## 2019-02-15 NOTE — ED Provider Notes (Signed)
[facility]    CSN: [id number] Arrival date & time: 02/15/19  1101     History   Chief Complaint Chief Complaint  Patient presents with  . Rash    HPI Brianna Velez is a 43 y.o. female.   Patient is a 43 year old female that presents today with rash on bilateral arms, neck, back.  This has been present for the past 5 days.  She is concerned because she has been staying at her sister's home which has been exposed to bedbugs. She has been sleeping on the cough. She reports that have plans to exterminate but havnt yet. She has been taking Benadryl and using calamine lotion for symptoms.  Denies any fever, joint pain. Denies any recent changes in lotions, detergents, foods or other possible irritants. No recent travel. Nobody else at home has the rash. Patient has been outside but denies any contact with plants. No new foods or medications.   ROS per HPI      Rash    Past Medical History:  Diagnosis Date  . Fibroids   . History of hiatal hernia   . Leg pain   . Migraine   . Sciatica     Patient Active Problem List   Diagnosis Date Noted  . Chronic pain syndrome 03/31/2016  . Screening for diabetes mellitus 12/31/2015  . S/P TAH (total abdominal hysterectomy) 09/29/2015  . Fibroid, uterine   . Menorrhagia with regular cycle   . Fibroid uterus 07/01/2015  . Left hip pain 11/06/2014    Past Surgical History:  Procedure Laterality Date  . ABDOMINAL HYSTERECTOMY  09/29/2015   Procedure: HYSTERECTOMY ABDOMINAL;  Surgeon: Mora Bellman, MD;  Location: [facility];  Service: Gynecology;;  . BILATERAL SALPINGECTOMY Bilateral 09/29/2015   Procedure: BILATERAL SALPINGECTOMY;  Surgeon: Mora Bellman, MD;  Location: [facility];  Service: Gynecology;  Laterality: Bilateral;  . TUBAL LIGATION    . WISDOM TOOTH EXTRACTION      OB History    Gravida  2   Para  2   Term  2   Preterm  0   AB  0   Living  2     SAB  0   TAB  0   Ectopic  0   Multiple  0    Live Births               Home Medications    Prior to Admission medications   Medication Sig Start Date End Date Taking? Authorizing Provider  busPIRone (BUSPAR) 10 MG tablet Take 1 tablet (10 mg total) by mouth 2 (two) times daily. 09/17/18  Yes Azzie Glatter, FNP  cyclobenzaprine (FLEXERIL) 10 MG tablet Take 1 tablet (10 mg total) by mouth 3 (three) times daily as needed for muscle spasms. 07/16/18  Yes Azzie Glatter, FNP  furosemide (LASIX) 20 MG tablet Take 1 tablet (20 mg total) by mouth daily as needed. 02/19/18  Yes Scot Jun, FNP  gabapentin (NEURONTIN) 300 MG capsule Take 1 capsule (300 mg total) by mouth 3 (three) times daily. 01/03/19  Yes Azzie Glatter, FNP  meloxicam (MOBIC) 15 MG tablet Take 1 tablet (15 mg total) by mouth daily. 10/18/18  Yes Azzie Glatter, FNP  pantoprazole (PROTONIX) 20 MG tablet Take 1 tablet (20 mg total) by mouth daily. 10/18/18  Yes Azzie Glatter, FNP  Prenatal Vit-Fe Fumarate-FA (PRENATAL MULTIVITAMIN) TABS tablet Take 1 tablet by mouth daily at 12 noon.   Yes [provider]  Aspirin-Acetaminophen-Caffeine (GOODY HEADACHE PO) Take by mouth.    [provider]  diphenhydrAMINE (BENADRYL) 25 MG tablet Take 25 mg by mouth every 6 (six) hours as needed.    [provider]  rifaximin (XIFAXAN) 550 MG TABS tablet Take 1 tablet (550 mg total) by mouth 3 (three) times daily. 10/22/18   Nelida Meuse III, MD  triamcinolone cream (KENALOG) 0.1 % Apply 1 application topically 2 (two) times daily. 02/15/19   Orvan July, NP    Family History Family History  Problem Relation Age of Onset  . Diabetes Sister   . Heart disease Paternal Grandmother   . Hypertension Father   . Dementia Mother   . Hypertension Mother     Social History Social History   Tobacco Use  . Smoking status: Former Smoker    Last attempt to quit: 12/05/1996    Years since quitting: 22.2  . Smokeless tobacco: Never Used   Substance Use Topics  . Alcohol use: No  . Drug use: No     Allergies   Prednisone and Hydrocodone   Review of Systems Review of Systems  Skin: Positive for rash.     Physical Exam Triage Vital Signs ED Triage Vitals [02/15/19 1144]  Enc Vitals Group     BP 120/70     Pulse Rate 87     Resp 14     Temp 97.7 F (36.5 C)     Temp Source Oral     SpO2 100 %     Weight      Height      Head Circumference      Peak Flow      Pain Score      Pain Loc      Pain Edu?      Excl. in [place]?    No data found.  Updated Vital Signs BP 120/70 (BP Location: Right Arm)   Pulse 87   Temp 97.7 F (36.5 C) (Oral)   Resp 14   LMP 08/14/2016   SpO2 100%   Visual Acuity Right Eye Distance:   Left Eye Distance:   Bilateral Distance:    Right Eye Near:   Left Eye Near:    Bilateral Near:     Physical Exam Vitals signs and nursing note reviewed.  Constitutional:      General: She is not in acute distress.    Appearance: Normal appearance. She is normal weight. She is not ill-appearing, toxic-appearing or diaphoretic.  HENT:     Head: Normocephalic and atraumatic.     Nose: Nose normal.  Eyes:     Conjunctiva/sclera: Conjunctivae normal.  Neck:     Musculoskeletal: Normal range of motion.  Pulmonary:     Effort: Pulmonary effort is normal.  Musculoskeletal: Normal range of motion.  Skin:    Findings: Rash present.  Neurological:     Mental Status: She is alert.  Psychiatric:        Mood and Affect: Mood normal.      UC Treatments / Results  Labs (all labs ordered are listed, but only abnormal results are displayed) Labs Reviewed - No data to display  EKG None  Radiology No results found.  Procedures Procedures (including critical care time)  Medications Ordered in UC Medications - No data to display  Initial Impression / Assessment and Plan / UC Course  I have reviewed the triage vital signs and the nursing notes.  Pertinent labs & imaging  results that were available during my care of the patient were reviewed by me and considered in my medical decision making (see chart for details).    Rash consistent with bed bug bites Will give her some steroid cream to help with the inflammation  Benadryl for itching.  Recommended that she try to avoid the situation she is in and where she is sleeping do to re exposure.  Follow up as needed for continued or worsening symptoms  Final Clinical Impressions(s) / UC Diagnoses   Final diagnoses:  Rash     Discharge Instructions     I believe this is bites from bed bugs.  I will give you some steroid cream to help with the inflammation.  Benadryl for itching.  Try to remove yourself from the situation or you will keep getting re bitten.  Follow up as needed for continued or worsening symptoms       ED Prescriptions    Medication Sig Dispense Auth. Provider   triamcinolone cream (KENALOG) 0.1 % Apply 1 application topically 2 (two) times daily. 30 g Loura Halt A, NP     Controlled Substance Prescriptions [place] Controlled Substance Registry consulted? Not Applicable   Orvan July, NP 02/15/19 1333

## 2019-02-22 ENCOUNTER — Ambulatory Visit (INDEPENDENT_AMBULATORY_CARE_PROVIDER_SITE_OTHER): Payer: Medicaid Other | Admitting: Family Medicine

## 2019-02-22 ENCOUNTER — Other Ambulatory Visit: Payer: Self-pay

## 2019-02-22 ENCOUNTER — Other Ambulatory Visit: Payer: Self-pay | Admitting: Family Medicine

## 2019-02-22 VITALS — BP 112/78 | HR 88 | Temp 98.0°F | Ht 64.0 in | Wt 166.0 lb

## 2019-02-22 DIAGNOSIS — M25562 Pain in left knee: Secondary | ICD-10-CM | POA: Diagnosis not present

## 2019-02-22 DIAGNOSIS — M25561 Pain in right knee: Secondary | ICD-10-CM

## 2019-02-22 DIAGNOSIS — R319 Hematuria, unspecified: Secondary | ICD-10-CM

## 2019-02-22 DIAGNOSIS — Z09 Encounter for follow-up examination after completed treatment for conditions other than malignant neoplasm: Secondary | ICD-10-CM | POA: Diagnosis not present

## 2019-02-22 DIAGNOSIS — R8290 Unspecified abnormal findings in urine: Secondary | ICD-10-CM

## 2019-02-22 DIAGNOSIS — R109 Unspecified abdominal pain: Secondary | ICD-10-CM

## 2019-02-22 DIAGNOSIS — R1907 Generalized intra-abdominal and pelvic swelling, mass and lump: Secondary | ICD-10-CM | POA: Diagnosis not present

## 2019-02-22 DIAGNOSIS — R140 Abdominal distension (gaseous): Secondary | ICD-10-CM

## 2019-02-22 DIAGNOSIS — Z0000 Encounter for general adult medical examination without abnormal findings: Secondary | ICD-10-CM

## 2019-02-22 DIAGNOSIS — G8929 Other chronic pain: Secondary | ICD-10-CM

## 2019-02-22 DIAGNOSIS — N390 Urinary tract infection, site not specified: Secondary | ICD-10-CM | POA: Diagnosis not present

## 2019-02-22 DIAGNOSIS — M62838 Other muscle spasm: Secondary | ICD-10-CM | POA: Diagnosis not present

## 2019-02-22 LAB — POCT URINALYSIS DIP (MANUAL ENTRY)
Bilirubin, UA: NEGATIVE
Blood, UA: NEGATIVE
Glucose, UA: NEGATIVE mg/dL
Ketones, POC UA: NEGATIVE mg/dL
Nitrite, UA: NEGATIVE
Protein Ur, POC: 30 mg/dL — AB
Spec Grav, UA: >=1.03 — AB (ref 1.010–1.025)
Urobilinogen, UA: 0.2 E.U./dL
pH, UA: 6 (ref 5.0–8.0)

## 2019-02-22 MED ORDER — CYCLOBENZAPRINE HCL 10 MG PO TABS: 10.0000 mg | ORAL_TABLET | Freq: Three times a day (TID) | ORAL | 3 refills | Status: DC | PRN

## 2019-02-22 MED ORDER — GABAPENTIN 300 MG PO CAPS: 300.0000 mg | ORAL_CAPSULE | Freq: Three times a day (TID) | ORAL | 6 refills | Status: DC

## 2019-02-22 MED ORDER — PANTOPRAZOLE SODIUM 20 MG PO TBEC: 20.0000 mg | DELAYED_RELEASE_TABLET | Freq: Every day | ORAL | 1 refills | Status: DC

## 2019-02-22 MED ORDER — MELOXICAM 15 MG PO TABS: 15.0000 mg | ORAL_TABLET | Freq: Every day | ORAL | 2 refills | Status: DC

## 2019-02-22 MED ORDER — BUSPIRONE HCL 10 MG PO TABS: 10.0000 mg | ORAL_TABLET | Freq: Three times a day (TID) | ORAL | 6 refills | Status: DC

## 2019-02-22 MED ORDER — SULFAMETHOXAZOLE-TRIMETHOPRIM 800-160 MG PO TABS: 1.0000 | ORAL_TABLET | Freq: Two times a day (BID) | ORAL | 0 refills | Status: DC

## 2019-02-22 MED ORDER — FUROSEMIDE 20 MG PO TABS: 20.0000 mg | ORAL_TABLET | Freq: Every day | ORAL | 6 refills | Status: DC | PRN

## 2019-02-22 NOTE — Progress Notes (Signed)
Patient [facility]  Established Patient Office Visit  Subjective:  Patient ID: Brianna Velez, female    DOB: [birth year]  Age: 43 y.o. MRN: [id number]  CC:  Chief Complaint  Patient presents with  . Spasms  . Knee Pain    HPI Brianna Velez is a 43 year old female who presents for follow up today.   Past Medical History:  Diagnosis Date  . Fibroids   . History of hiatal hernia   . Leg pain   . Migraine   . Sciatica    Current Status: Since her last office visit, her anxiety is moderate today. She continues to have family and job stressors. She has follow up appointments with GI. She denies suicidal ideations, homicidal ideations, or auditory hallucinations. Her abdominal problems have improved today. She continues to have increased flatulence, abdominal swelling, and belching.She continues to follow up with GI as needed. She continues to have increased flatulence, abdominal swelling, and belching. No reports of GI problems such as nausea, vomiting, diarrhea, and constipation. She has no reports of blood in stools, dysuria and hematuria.   She denies fevers, chills, fatigue, recent infections, weight loss, and night sweats. She has not had any headaches, visual changes, dizziness, and falls. No chest pain, heart palpitations, cough and shortness of breath reported. She has moderate pain today.   Past Surgical History:  Procedure Laterality Date  . ABDOMINAL HYSTERECTOMY  09/29/2015   Procedure: HYSTERECTOMY ABDOMINAL;  Surgeon: Mora Bellman, MD;  Location: [facility];  Service: Gynecology;;  . BILATERAL SALPINGECTOMY Bilateral 09/29/2015   Procedure: BILATERAL SALPINGECTOMY;  Surgeon: Mora Bellman, MD;  Location: [facility];  Service: Gynecology;  Laterality: Bilateral;  . TUBAL LIGATION    . WISDOM TOOTH EXTRACTION      Family History  Problem Relation Age of Onset  . Diabetes Sister   . Heart disease Paternal Grandmother   .  Hypertension Father   . Dementia Mother   . Hypertension Mother     Social History   Socioeconomic History  . Marital status: Married    Spouse name: Not on file  . Number of children: Not on file  . Years of education: Not on file  . Highest education level: Not on file  Occupational History  . Not on file  Social Needs  . Financial resource strain: Not on file  . Food insecurity:    Worry: Not on file    Inability: Not on file  . Transportation needs:    Medical: Not on file    Non-medical: Not on file  Tobacco Use  . Smoking status: Former Smoker    Last attempt to quit: 12/05/1996    Years since quitting: 22.2  . Smokeless tobacco: Never Used  Substance and Sexual Activity  . Alcohol use: No  . Drug use: No  . Sexual activity: Not on file  Lifestyle  . Physical activity:    Days per week: Not on file    Minutes per session: Not on file  . Stress: Not on file  Relationships  . Social connections:    Talks on phone: Not on file    Gets together: Not on file    Attends religious service: Not on file    Active member of club or organization: Not on file    Attends meetings of clubs or organizations: Not on file    Relationship status: Not on file  . Intimate partner violence:  Fear of current or ex partner: Not on file    Emotionally abused: Not on file    Physically abused: Not on file    Forced sexual activity: Not on file  Other Topics Concern  . Not on file  Social History Narrative  . Not on file    Outpatient Medications Prior to Visit  Medication Sig Dispense Refill  . diphenhydrAMINE (BENADRYL) 25 MG tablet Take 25 mg by mouth every 6 (six) hours as needed.    . busPIRone (BUSPAR) 15 MG tablet Take 15 mg by mouth 2 (two) times daily.     . cyclobenzaprine (FLEXERIL) 10 MG tablet Take 1 tablet (10 mg total) by mouth 3 (three) times daily as needed for muscle spasms. 30 tablet 0  . gabapentin (NEURONTIN) 300 MG capsule Take 1 capsule (300 mg total)  by mouth 3 (three) times daily. 90 capsule 0  . meloxicam (MOBIC) 15 MG tablet Take 1 tablet (15 mg total) by mouth daily. 30 tablet 2  . rifaximin (XIFAXAN) 550 MG TABS tablet Take 1 tablet (550 mg total) by mouth 3 (three) times daily. (Patient not taking: Reported on 02/22/2019) 42 tablet 0  . triamcinolone cream (KENALOG) 0.1 % Apply 1 application topically 2 (two) times daily. (Patient not taking: Reported on 02/22/2019) 30 g 0  . Aspirin-Acetaminophen-Caffeine (GOODY HEADACHE PO) Take by mouth.    . busPIRone (BUSPAR) 10 MG tablet Take 1 tablet (10 mg total) by mouth 2 (two) times daily. 60 tablet 3  . furosemide (LASIX) 20 MG tablet Take 1 tablet (20 mg total) by mouth daily as needed. (Patient not taking: Reported on 02/22/2019) 30 tablet 3  . pantoprazole (PROTONIX) 20 MG tablet Take 1 tablet (20 mg total) by mouth daily. (Patient not taking: Reported on 02/22/2019) 30 tablet 1  . Prenatal Vit-Fe Fumarate-FA (PRENATAL MULTIVITAMIN) TABS tablet Take 1 tablet by mouth daily at 12 noon.     No facility-administered medications prior to visit.     Allergies  Allergen Reactions  . Prednisone     Insomnia "it has me up all night"  . Hydrocodone Rash    ROS Review of Systems  Constitutional: Negative.   HENT: Negative.   Eyes: Negative.   Respiratory: Negative.   Cardiovascular: Negative.   Gastrointestinal: Negative.   Endocrine: Negative.   Genitourinary: Negative.   Allergic/Immunologic: Negative.   Neurological: Positive for dizziness and headaches.  Hematological: Negative.   Psychiatric/Behavioral: Negative.       Objective:    Physical Exam  Constitutional: She is oriented to person, place, and time. She appears well-developed and well-nourished.  HENT:  Head: Normocephalic and atraumatic.  Right Ear: External ear normal.  Left Ear: External ear normal.  Mouth/Throat: Oropharynx is clear and moist.  Eyes: Conjunctivae are normal.  Neck: Normal range of motion. Neck  supple.  Cardiovascular: Normal rate, regular rhythm, normal heart sounds and intact distal pulses.  Pulmonary/Chest: Effort normal and breath sounds normal.  Abdominal: Soft. Bowel sounds are normal.  Musculoskeletal: Normal range of motion.  Neurological: She is alert and oriented to person, place, and time. She has normal reflexes.  Skin: Skin is warm and dry.  Psychiatric: She has a normal mood and affect. Her behavior is normal. Judgment and thought content normal.  Nursing note and vitals reviewed.   BP 112/78 (BP Location: Left Arm, Patient Position: Sitting, Cuff Size: Small)   Pulse 88   Temp 98 F (36.7 C) (Oral)   Ht 5'  4" (1.626 m)   Wt 166 lb (75.3 kg)   LMP 08/14/2016   SpO2 99%   BMI 28.49 kg/m  Wt Readings from Last 3 Encounters:  02/22/19 166 lb (75.3 kg)  10/16/18 159 lb (72.1 kg)  09/17/18 159 lb (72.1 kg)     Health Maintenance Due  Topic Date Due  . PAP SMEAR-Modifier  04/30/2017    There are no preventive care reminders to display for this patient.  Lab Results  Component Value Date   TSH 1.69 07/06/2018   Lab Results  Component Value Date   WBC 4.3 04/13/2018   HGB 13.1 04/13/2018   HCT 37.2 04/13/2018   MCV 93 04/13/2018   PLT 219 04/13/2018   Lab Results  Component Value Date   NA 136 04/13/2018   K 4.1 04/13/2018   CO2 20 04/13/2018   GLUCOSE 96 04/13/2018   BUN 13 04/13/2018   CREATININE 0.76 04/13/2018   BILITOT <0.2 04/13/2018   ALKPHOS 52 04/13/2018   AST 15 04/13/2018   ALT 11 04/13/2018   PROT 7.1 04/13/2018   ALBUMIN 4.4 04/13/2018   CALCIUM 9.1 04/13/2018   ANIONGAP 9 10/18/2017   No results found for: CHOL No results found for: HDL No results found for: LDLCALC No results found for: TRIG No results found for: CHOLHDL No results found for: HGBA1C  Assessment & Plan:   1. Abdominal distension (gaseous) - pantoprazole (PROTONIX) 20 MG tablet; Take 1 tablet (20 mg total) by mouth daily.  Dispense: 30 tablet;  Refill: 1 - US Abdomen Complete; Future  2. Abdominal swelling, generalized - US Abdomen Complete; Future  3. Flatulence/gas pain/belching  4. Chronic pain of both knees - gabapentin (NEURONTIN) 300 MG capsule; Take 1 capsule (300 mg total) by mouth 3 (three) times daily.  Dispense: 90 capsule; Refill: 6  5. Chronic abdominal pain - meloxicam (MOBIC) 15 MG tablet; Take 1 tablet (15 mg total) by mouth daily.  Dispense: 30 tablet; Refill: 2 - US Abdomen Complete; Future  6. Muscle spasms of both lower extremities We will initiate Flexeril as prescribed.  - cyclobenzaprine (FLEXERIL) 10 MG tablet; Take 1 tablet (10 mg total) by mouth 3 (three) times daily as needed for muscle spasms.  Dispense: 30 tablet; Refill: 3  7. Healthcare maintenance Results are pending. - CBC with Differential - Comprehensive metabolic panel - Lipid Panel - TSH - Vitamin D, 25-hydroxy - Vitamin B12  8. Follow up She will follow up in 6 months.  - POCT urinalysis dipstick  Meds ordered this encounter  Medications  . gabapentin (NEURONTIN) 300 MG capsule    Sig: Take 1 capsule (300 mg total) by mouth 3 (three) times daily.    Dispense:  90 capsule    Refill:  6    Patient needs appointment  . pantoprazole (PROTONIX) 20 MG tablet    Sig: Take 1 tablet (20 mg total) by mouth daily.    Dispense:  30 tablet    Refill:  1  . meloxicam (MOBIC) 15 MG tablet    Sig: Take 1 tablet (15 mg total) by mouth daily.    Dispense:  30 tablet    Refill:  2  . busPIRone (BUSPAR) 10 MG tablet    Sig: Take 1 tablet (10 mg total) by mouth 3 (three) times daily.    Dispense:  60 tablet    Refill:  6  . furosemide (LASIX) 20 MG tablet    Sig: Take 1 tablet (20  mg total) by mouth daily as needed.    Dispense:  30 tablet    Refill:  6  . cyclobenzaprine (FLEXERIL) 10 MG tablet    Sig: Take 1 tablet (10 mg total) by mouth 3 (three) times daily as needed for muscle spasms.    Dispense:  30 tablet    Refill:  3     Orders Placed This Encounter  Procedures  . US Abdomen Complete  . CBC with Differential  . Comprehensive metabolic panel  . Lipid Panel  . TSH  . Vitamin D, 25-hydroxy  . Vitamin B12  . POCT urinalysis dipstick    Referral Orders  No referral(s) requested today    Kathe Becton,  MSN, FNP-C Patient [address] [place], [place] [address] [phone]    Problem List Items Addressed This Visit    None    Visit Diagnoses    Abdominal distension (gaseous)    -  Primary   Relevant Medications   pantoprazole (PROTONIX) 20 MG tablet   Other Relevant Orders   US Abdomen Complete   Abdominal swelling, generalized       Relevant Orders   US Abdomen Complete   Flatulence/gas pain/belching       Chronic pain of both knees       Relevant Medications   gabapentin (NEURONTIN) 300 MG capsule   meloxicam (MOBIC) 15 MG tablet   cyclobenzaprine (FLEXERIL) 10 MG tablet   Chronic abdominal pain       Relevant Medications   gabapentin (NEURONTIN) 300 MG capsule   meloxicam (MOBIC) 15 MG tablet   cyclobenzaprine (FLEXERIL) 10 MG tablet   Other Relevant Orders   US Abdomen Complete   Muscle spasms of both lower extremities       Relevant Medications   cyclobenzaprine (FLEXERIL) 10 MG tablet   Healthcare maintenance       Relevant Orders   CBC with Differential   Comprehensive metabolic panel   Lipid Panel   TSH   Vitamin D, 25-hydroxy   Vitamin B12   Follow up       Relevant Orders   POCT urinalysis dipstick (Completed)      Meds ordered this encounter  Medications  . gabapentin (NEURONTIN) 300 MG capsule    Sig: Take 1 capsule (300 mg total) by mouth 3 (three) times daily.    Dispense:  90 capsule    Refill:  6    Patient needs appointment  . pantoprazole (PROTONIX) 20 MG tablet    Sig: Take 1 tablet (20 mg total) by mouth daily.    Dispense:  30 tablet    Refill:  1  . meloxicam (MOBIC) 15 MG tablet    Sig:  Take 1 tablet (15 mg total) by mouth daily.    Dispense:  30 tablet    Refill:  2  . busPIRone (BUSPAR) 10 MG tablet    Sig: Take 1 tablet (10 mg total) by mouth 3 (three) times daily.    Dispense:  60 tablet    Refill:  6  . furosemide (LASIX) 20 MG tablet    Sig: Take 1 tablet (20 mg total) by mouth daily as needed.    Dispense:  30 tablet    Refill:  6  . cyclobenzaprine (FLEXERIL) 10 MG tablet    Sig: Take 1 tablet (10 mg total) by mouth 3 (three) times daily as needed for muscle spasms.    Dispense:  30 tablet    Refill:  3    Follow-up: Return in about 6 months (around 08/25/2019).    Azzie Glatter, FNP     3

## 2019-02-23 ENCOUNTER — Encounter: Payer: Self-pay | Admitting: Family Medicine

## 2019-02-23 LAB — COMPREHENSIVE METABOLIC PANEL
ALT: 28 IU/L (ref 0–32)
AST: 25 IU/L (ref 0–40)
Albumin/Globulin Ratio: 1.6 (ref 1.2–2.2)
Albumin: 4.5 g/dL (ref 3.8–4.8)
Alkaline Phosphatase: 70 IU/L (ref 39–117)
BUN/Creatinine Ratio: 10 (ref 9–23)
BUN: 8 mg/dL (ref 6–24)
Bilirubin Total: <0.2 mg/dL (ref 0.0–1.2)
CO2: 21 mmol/L (ref 20–29)
Calcium: 9.5 mg/dL (ref 8.7–10.2)
Chloride: 101 mmol/L (ref 96–106)
Creatinine, Ser: 0.77 mg/dL (ref 0.57–1.00)
GFR calc Af Amer: 109 mL/min/{1.73_m2} (ref >59)
GFR calc non Af Amer: 94 mL/min/{1.73_m2} (ref >59)
Globulin, Total: 2.9 g/dL (ref 1.5–4.5)
Glucose: 100 mg/dL — ABNORMAL HIGH (ref 65–99)
Potassium: 4.2 mmol/L (ref 3.5–5.2)
Sodium: 139 mmol/L (ref 134–144)
Total Protein: 7.4 g/dL (ref 6.0–8.5)

## 2019-02-23 LAB — CBC WITH DIFFERENTIAL/PLATELET
Basophils Absolute: 0 10*3/uL (ref 0.0–0.2)
Basos: 0 %
EOS (ABSOLUTE): 0.1 10*3/uL (ref 0.0–0.4)
Eos: 3 %
Hematocrit: 41.8 % (ref 34.0–46.6)
Hemoglobin: 14 g/dL (ref 11.1–15.9)
Immature Grans (Abs): 0 10*3/uL (ref 0.0–0.1)
Immature Granulocytes: 1 %
Lymphocytes Absolute: 1.1 10*3/uL (ref 0.7–3.1)
Lymphs: 29 %
MCH: 30.6 pg (ref 26.6–33.0)
MCHC: 33.5 g/dL (ref 31.5–35.7)
MCV: 92 fL (ref 79–97)
Monocytes Absolute: 0.4 10*3/uL (ref 0.1–0.9)
Monocytes: 9 %
Neutrophils Absolute: 2.3 10*3/uL (ref 1.4–7.0)
Neutrophils: 58 %
Platelets: 173 10*3/uL (ref 150–450)
RBC: 4.57 x10E6/uL (ref 3.77–5.28)
RDW: 12.2 % (ref 11.7–15.4)
WBC: 3.9 10*3/uL (ref 3.4–10.8)

## 2019-02-23 LAB — LIPID PANEL
Chol/HDL Ratio: 5.2 ratio — ABNORMAL HIGH (ref 0.0–4.4)
Cholesterol, Total: 217 mg/dL — ABNORMAL HIGH (ref 100–199)
HDL: 42 mg/dL (ref >39)
LDL Calculated: 138 mg/dL — ABNORMAL HIGH (ref 0–99)
Triglycerides: 184 mg/dL — ABNORMAL HIGH (ref 0–149)
VLDL Cholesterol Cal: 37 mg/dL (ref 5–40)

## 2019-02-23 LAB — TSH: TSH: 2.31 u[IU]/mL (ref 0.450–4.500)

## 2019-02-23 LAB — VITAMIN B12: Vitamin B-12: 652 pg/mL (ref 232–1245)

## 2019-02-23 LAB — VITAMIN D 25 HYDROXY (VIT D DEFICIENCY, FRACTURES): Vit D, 25-Hydroxy: 21.4 ng/mL — ABNORMAL LOW (ref 30.0–100.0)

## 2019-02-24 LAB — URINE CULTURE

## 2019-02-26 ENCOUNTER — Other Ambulatory Visit: Payer: Self-pay | Admitting: Family Medicine

## 2019-02-26 DIAGNOSIS — E559 Vitamin D deficiency, unspecified: Secondary | ICD-10-CM

## 2019-02-26 MED ORDER — VITAMIN D (ERGOCALCIFEROL) 1.25 MG (50000 UNIT) PO CAPS: 50000.0000 [IU] | ORAL_CAPSULE | ORAL | 3 refills | Status: DC

## 2019-03-26 ENCOUNTER — Ambulatory Visit (HOSPITAL_COMMUNITY): Payer: Medicaid Other

## 2019-04-01 ENCOUNTER — Ambulatory Visit (HOSPITAL_COMMUNITY)
Admission: EM | Admit: 2019-04-01 | Discharge: 2019-04-01 | Disposition: A | Discharge status: Home / Self Care | Payer: Medicaid Other | Attending: Family Medicine | Admitting: Family Medicine

## 2019-04-01 ENCOUNTER — Ambulatory Visit (INDEPENDENT_AMBULATORY_CARE_PROVIDER_SITE_OTHER): Payer: Medicaid Other

## 2019-04-01 ENCOUNTER — Encounter (HOSPITAL_COMMUNITY): Payer: Self-pay

## 2019-04-01 ENCOUNTER — Other Ambulatory Visit: Payer: Self-pay

## 2019-04-01 ENCOUNTER — Telehealth: Payer: Self-pay

## 2019-04-01 DIAGNOSIS — R600 Localized edema: Secondary | ICD-10-CM

## 2019-04-01 DIAGNOSIS — J9811 Atelectasis: Secondary | ICD-10-CM | POA: Diagnosis not present

## 2019-04-01 DIAGNOSIS — R000 Tachycardia, unspecified: Secondary | ICD-10-CM | POA: Insufficient documentation

## 2019-04-01 LAB — POCT URINALYSIS DIP (DEVICE)
Bilirubin Urine: NEGATIVE
Glucose, UA: NEGATIVE mg/dL
Hgb urine dipstick: NEGATIVE
Ketones, ur: NEGATIVE mg/dL
Leukocytes,Ua: NEGATIVE
Nitrite: NEGATIVE
Protein, ur: NEGATIVE mg/dL
Specific Gravity, Urine: 1.02 (ref 1.005–1.030)
Urobilinogen, UA: 4 mg/dL — ABNORMAL HIGH (ref 0.0–1.0)
pH: 8.5 — ABNORMAL HIGH (ref 5.0–8.0)

## 2019-04-01 LAB — BASIC METABOLIC PANEL
Anion gap: 11 (ref 5–15)
BUN: 13 mg/dL (ref 6–20)
CO2: 31 mmol/L (ref 22–32)
Calcium: 9.6 mg/dL (ref 8.9–10.3)
Chloride: 100 mmol/L (ref 98–111)
Creatinine, Ser: 0.86 mg/dL (ref 0.44–1.00)
GFR calc Af Amer: >60 mL/min (ref >60)
GFR calc non Af Amer: >60 mL/min (ref >60)
Glucose, Bld: 107 mg/dL — ABNORMAL HIGH (ref 70–99)
Potassium: 4 mmol/L (ref 3.5–5.1)
Sodium: 142 mmol/L (ref 135–145)

## 2019-04-01 NOTE — Discharge Instructions (Signed)
Stay on a low-salt diet Elevate your feet above the level of your heart Take Lasix 60 mg a day and a single dose Call your doctor tomorrow for additional instructions

## 2019-04-01 NOTE — ED Provider Notes (Signed)
[facility]    CSN: [id number] Arrival date & time: 04/01/19  1424     History   Chief Complaint Chief Complaint  Patient presents with  . Leg Swelling    HPI Brianna Velez is a 43 y.o. female.   HPI   Patient is here with a chief complaint of leg swelling.  She states is gradually worsening over a 1 week period of time.  She states is gradually becoming more uncomfortable for her as well.  Left foot is swollen more than right.  She takes Lasix 20 mg a day for pedal edema.  Because of her edema she has increased this to 2 pills a day.  This has not helped.  She denies that she is changed her diet.  She denies any change in medication.  She does not think she is loaded on salt.  She is at home because of the COVID-19 pandemic and has not had any change in activity or prolonged standing.  She denies any chest pain or shortness of breath.  No abdominal distention.  No hand swelling.  She called her primary care doctor and was told to come in today.  She did have complete blood work done on 02/22/2019 and it was normal.  Past Medical History:  Diagnosis Date  . Fibroids   . History of hiatal hernia   . Leg pain   . Migraine   . Sciatica     Patient Active Problem List   Diagnosis Date Noted  . Chronic pain syndrome 03/31/2016  . Screening for diabetes mellitus 12/31/2015  . S/P TAH (total abdominal hysterectomy) 09/29/2015  . Fibroid, uterine   . Menorrhagia with regular cycle   . Fibroid uterus 07/01/2015  . Left hip pain 11/06/2014    Past Surgical History:  Procedure Laterality Date  . ABDOMINAL HYSTERECTOMY  09/29/2015   Procedure: HYSTERECTOMY ABDOMINAL;  Surgeon: Mora Bellman, MD;  Location: [facility];  Service: Gynecology;;  . BILATERAL SALPINGECTOMY Bilateral 09/29/2015   Procedure: BILATERAL SALPINGECTOMY;  Surgeon: Mora Bellman, MD;  Location: [facility];  Service: Gynecology;  Laterality: Bilateral;  . TUBAL LIGATION    . WISDOM TOOTH EXTRACTION       OB History    Gravida  2   Para  2   Term  2   Preterm  0   AB  0   Living  2     SAB  0   TAB  0   Ectopic  0   Multiple  0   Live Births               Home Medications    Prior to Admission medications   Medication Sig Start Date End Date Taking? Authorizing Provider  busPIRone (BUSPAR) 10 MG tablet Take 1 tablet (10 mg total) by mouth 3 (three) times daily. 02/22/19   Azzie Glatter, FNP  cyclobenzaprine (FLEXERIL) 10 MG tablet Take 1 tablet (10 mg total) by mouth 3 (three) times daily as needed for muscle spasms. 02/22/19   Azzie Glatter, FNP  furosemide (LASIX) 20 MG tablet Take 1 tablet (20 mg total) by mouth daily as needed. 02/22/19   Azzie Glatter, FNP  gabapentin (NEURONTIN) 300 MG capsule Take 1 capsule (300 mg total) by mouth 3 (three) times daily. 02/22/19   Azzie Glatter, FNP  meloxicam (MOBIC) 15 MG tablet Take 1 tablet (15 mg total) by mouth daily. 02/22/19   Azzie Glatter, FNP  pantoprazole (  PROTONIX) 20 MG tablet Take 1 tablet (20 mg total) by mouth daily. 02/22/19   Azzie Glatter, FNP  sulfamethoxazole-trimethoprim (BACTRIM DS,SEPTRA DS) 800-160 MG tablet Take 1 tablet by mouth 2 (two) times daily. 02/22/19   Azzie Glatter, FNP  Vitamin D, Ergocalciferol, (DRISDOL) 1.25 MG (50000 UT) CAPS capsule Take 1 capsule (50,000 Units total) by mouth every 7 (seven) days. 02/26/19   Azzie Glatter, FNP    Family History Family History  Problem Relation Age of Onset  . Diabetes Sister   . Heart disease Paternal Grandmother   . Hypertension Father   . Dementia Mother   . Hypertension Mother     Social History Social History   Tobacco Use  . Smoking status: Former Smoker    Last attempt to quit: 12/05/1996    Years since quitting: 22.3  . Smokeless tobacco: Never Used  Substance Use Topics  . Alcohol use: No  . Drug use: No     Allergies   Prednisone and Hydrocodone   Review of Systems Review of Systems   Constitutional: Negative for chills and fever.  HENT: Negative for ear pain and sore throat.   Eyes: Negative for pain and visual disturbance.  Respiratory: Negative for cough and shortness of breath.   Cardiovascular: Positive for leg swelling. Negative for chest pain and palpitations.  Gastrointestinal: Negative for abdominal pain and vomiting.  Genitourinary: Negative for dysuria and hematuria.  Musculoskeletal: Negative for arthralgias and back pain.  Skin: Negative for color change and rash.  Neurological: Negative for seizures and syncope.  Psychiatric/Behavioral: Negative for sleep disturbance. The patient is not nervous/anxious.   All other systems reviewed and are negative.    Physical Exam Triage Vital Signs ED Triage Vitals  Enc Vitals Group     BP 04/01/19 1438 115/74     Pulse Rate 04/01/19 1438 90     Resp 04/01/19 1438 16     Temp 04/01/19 1438 98.1 F (36.7 C)     Temp Source 04/01/19 1438 Oral     SpO2 04/01/19 1438 98 %     Weight 04/01/19 1437 166 lb (75.3 kg)     Height --      Head Circumference --      Peak Flow --      Pain Score 04/01/19 1436 8     Pain Loc --      Pain Edu? --      Excl. in [place]? --    No data found.  Updated Vital Signs BP 115/74 (BP Location: Right Arm)   Pulse 90   Temp 98.1 F (36.7 C) (Oral)   Resp 16   Wt 75.3 kg   LMP 08/14/2016   SpO2 98%   BMI 28.49 kg/m      Physical Exam Constitutional:      General: She is not in acute distress.    Appearance: She is well-developed. She is obese.  HENT:     Head: Normocephalic and atraumatic.     Right Ear: Tympanic membrane and ear canal normal.     Left Ear: Tympanic membrane and ear canal normal.     Nose: Nose normal.     Mouth/Throat:     Mouth: Mucous membranes are moist.     Pharynx: No posterior oropharyngeal erythema.  Eyes:     Conjunctiva/sclera: Conjunctivae normal.     Pupils: Pupils are equal, round, and reactive to light.  Neck:     Musculoskeletal:  Normal range of motion.     Vascular: No carotid bruit.  Cardiovascular:     Rate and Rhythm: Tachycardia present.     Heart sounds: Normal heart sounds.  Pulmonary:     Effort: Pulmonary effort is normal. No respiratory distress.     Breath sounds: Normal breath sounds.     Comments: Lungs are clear Abdominal:     General: Bowel sounds are normal. There is no distension.     Palpations: Abdomen is soft. There is no mass.     Tenderness: There is no abdominal tenderness.     Comments: No organomegaly  Musculoskeletal: Normal range of motion.     Right lower leg: Edema present.     Left lower leg: Edema present.     Comments: 2-3+ pitting edema to mid shin  Lymphadenopathy:     Cervical: No cervical adenopathy.  Skin:    General: Skin is warm and dry.  Neurological:     General: No focal deficit present.     Mental Status: She is alert.  Psychiatric:        Mood and Affect: Mood normal.        Behavior: Behavior normal.       UC Treatments / Results  Labs (all labs ordered are listed, but only abnormal results are displayed) Labs Reviewed  BASIC METABOLIC PANEL - Abnormal; Notable for the following components:      Result Value   Glucose, Bld 107 (*)    All other components within normal limits  POCT URINALYSIS DIP (DEVICE) - Abnormal; Notable for the following components:   pH 8.5 (*)    Urobilinogen, UA 4.0 (*)    All other components within normal limits    EKG None  Radiology Dg Chest 2 View  Result Date: 04/01/2019 CLINICAL DATA:  Tachycardia and lower extremity edema for 1 week. EXAM: CHEST - 2 VIEW COMPARISON:  10/18/2017 FINDINGS: The heart size and mediastinal contours are within normal limits. Mild linear opacity both lung bases appears new, consistent with mild subsegmental atelectasis. No evidence of pulmonary airspace disease or edema. No evidence of pleural effusion. The visualized skeletal structures are unremarkable. IMPRESSION: Mild bibasilar  subsegmental atelectasis.  Otherwise negative. Electronically Signed   By: Earle Gell M.D.   On: 04/01/2019 15:43    Procedures Procedures (including critical care time)  Medications Ordered in UC Medications - No data to display  Initial Impression / Assessment and Plan / UC Course  I have reviewed the triage vital signs and the nursing notes.  Pertinent labs & imaging results that were available during my care of the patient were reviewed by me and considered in my medical decision making (see chart for details).     Question on her chest x-ray whether she has a little cardiomegaly and fluid related.  The radiologist read it is negative for with atelectasis.  I cannot explain her edema, although it is quite significant pitting almost to the knee.  He did additional cardiac studies.  She is stable to go home on Lasix.  She is advised to call her primary care doctor tomorrow.  If she gets acutely worse with any chest pain shortness of breath and she should come to the ER  Final Clinical Impressions(s) / UC Diagnoses   Final diagnoses:  Pedal edema  Tachycardia     Discharge Instructions     Stay on a low-salt diet Elevate your feet above the level of your heart Take  Lasix 60 mg a day and a single dose Call your doctor tomorrow for additional instructions    ED Prescriptions    None     Controlled Substance Prescriptions [place] Controlled Substance Registry consulted? Not Applicable   Raylene Everts, MD 04/01/19 2005

## 2019-04-01 NOTE — ED Triage Notes (Signed)
Pt cc she has swelling in both feet for a week now. Pt takes a water pills she has been taking 2 at a time.

## 2019-04-01 NOTE — Telephone Encounter (Signed)
FYI-Patient states that she has been on her feet a lot and has been having increased swelling. Patient states that she has been doubling up on the Lasix and still no relieve. Patient is aware that you are out of office. Patient was advise to go to urgent care to have evaluated and then follow up .

## 2019-04-13 ENCOUNTER — Other Ambulatory Visit: Payer: Self-pay | Admitting: Family Medicine

## 2019-04-13 DIAGNOSIS — R140 Abdominal distension (gaseous): Secondary | ICD-10-CM

## 2019-04-15 ENCOUNTER — Telehealth: Payer: Self-pay

## 2019-04-15 NOTE — Telephone Encounter (Signed)
Patient states that swelling in her legs and feet are getting worse and she is taking the Furosemide as prescribed. Patient is also having hot flashes and would like to have something prescribed as well.

## 2019-04-16 NOTE — Telephone Encounter (Signed)
Left a vm for patient to callback 

## 2019-04-16 NOTE — Telephone Encounter (Signed)
Patient has schedule appointment.  

## 2019-04-17 ENCOUNTER — Encounter: Payer: Self-pay | Admitting: Family Medicine

## 2019-04-17 ENCOUNTER — Ambulatory Visit (INDEPENDENT_AMBULATORY_CARE_PROVIDER_SITE_OTHER): Payer: Medicaid Other | Admitting: Family Medicine

## 2019-04-17 ENCOUNTER — Other Ambulatory Visit: Payer: Self-pay

## 2019-04-17 ENCOUNTER — Other Ambulatory Visit: Payer: Self-pay | Admitting: Family Medicine

## 2019-04-17 VITALS — BP 118/68 | HR 98 | Temp 97.9°F | Ht 64.0 in | Wt 178.0 lb

## 2019-04-17 DIAGNOSIS — R140 Abdominal distension (gaseous): Secondary | ICD-10-CM | POA: Diagnosis not present

## 2019-04-17 DIAGNOSIS — R1907 Generalized intra-abdominal and pelvic swelling, mass and lump: Secondary | ICD-10-CM

## 2019-04-17 DIAGNOSIS — G43919 Migraine, unspecified, intractable, without status migrainosus: Secondary | ICD-10-CM | POA: Diagnosis not present

## 2019-04-17 DIAGNOSIS — R609 Edema, unspecified: Secondary | ICD-10-CM

## 2019-04-17 DIAGNOSIS — R600 Localized edema: Secondary | ICD-10-CM

## 2019-04-17 DIAGNOSIS — Z09 Encounter for follow-up examination after completed treatment for conditions other than malignant neoplasm: Secondary | ICD-10-CM | POA: Diagnosis not present

## 2019-04-17 MED ORDER — FUROSEMIDE 40 MG PO TABS: 40.0000 mg | ORAL_TABLET | Freq: Every day | ORAL | 1 refills | Status: DC

## 2019-04-17 MED ORDER — BUTALBITAL-APAP-CAFFEINE 50-325-40 MG PO TABS: 1.0000 | ORAL_TABLET | Freq: Four times a day (QID) | ORAL | 0 refills | Status: DC | PRN

## 2019-04-17 NOTE — Patient Instructions (Signed)
Furosemide tablets What is this medicine? FUROSEMIDE (fyoor [place] se mide) is a diuretic. It helps you make more urine and to lose salt and excess water from your body. This medicine is used to treat high blood pressure, and edema or swelling from heart, kidney, or liver disease. This medicine may be used for other purposes; ask your health care provider or pharmacist if you have questions. COMMON BRAND NAME(S): Active-Medicated Specimen Kit, Delone, Diuscreen, Lasix, RX Specimen Collection Kit, Specimen Collection Kit, URINX Medicated Specimen Collection What should I tell my health care provider before I take this medicine? They need to know if you have any of these conditions: -abnormal blood electrolytes -diarrhea or vomiting -gout -heart disease -kidney disease, small amounts of urine, or difficulty passing urine -liver disease -thyroid disease -an unusual or allergic reaction to furosemide, sulfa drugs, other medicines, foods, dyes, or preservatives -pregnant or trying to get pregnant -breast-feeding How should I use this medicine? Take this medicine by mouth with a glass of water. Follow the directions on the prescription label. You may take this medicine with or without food. If it upsets your stomach, take it with food or milk. Do not take your medicine more often than directed. Remember that you will need to pass more urine after taking this medicine. Do not take your medicine at a time of day that will cause you problems. Do not take at bedtime. Talk to your pediatrician regarding the use of this medicine in children. While this drug may be prescribed for selected conditions, precautions do apply. Overdosage: If you think you have taken too much of this medicine contact a poison control center or emergency room at once. NOTE: This medicine is only for you. Do not share this medicine with others. What if I miss a dose? If you miss a dose, take it as soon as you can. If it is almost time  for your next dose, take only that dose. Do not take double or extra doses. What may interact with this medicine? -aspirin and aspirin-like medicines -certain antibiotics -chloral hydrate -cisplatin -cyclosporine -digoxin -diuretics -laxatives -lithium -medicines for blood pressure -medicines that relax muscles for surgery -methotrexate -NSAIDs, medicines for pain and inflammation like ibuprofen, naproxen, or indomethacin -phenytoin -steroid medicines like prednisone or cortisone -sucralfate -thyroid hormones This list may not describe all possible interactions. Give your health care provider a list of all the medicines, herbs, non-prescription drugs, or dietary supplements you use. Also tell them if you smoke, drink alcohol, or use illegal drugs. Some items may interact with your medicine. What should I watch for while using this medicine? Visit your doctor or health care professional for regular checks on your progress. Check your blood pressure regularly. Ask your doctor or health care professional what your blood pressure should be, and when you should contact him or her. If you are a diabetic, check your blood sugar as directed. You may need to be on a special diet while taking this medicine. Check with your doctor. Also, ask how many glasses of fluid you need to drink a day. You must not get dehydrated. You may get drowsy or dizzy. Do not drive, use machinery, or do anything that needs mental alertness until you know how this drug affects you. Do not stand or sit up quickly, especially if you are an older patient. This reduces the risk of dizzy or fainting spells. Alcohol can make you more drowsy and dizzy. Avoid alcoholic drinks. This medicine can make you more sensitive  to the sun. Keep out of the sun. If you cannot avoid being in the sun, wear protective clothing and use sunscreen. Do not use sun lamps or tanning beds/booths. What side effects may I notice from receiving this  medicine? Side effects that you should report to your doctor or health care professional as soon as possible: -blood in urine or stools -dry mouth -fever or chills -hearing loss or ringing in the ears -irregular heartbeat -muscle pain or weakness, cramps -skin rash -stomach upset, pain, or nausea -tingling or numbness in the hands or feet -unusually weak or tired -vomiting or diarrhea -yellowing of the eyes or skin Side effects that usually do not require medical attention (report to your doctor or health care professional if they continue or are bothersome): -headache -loss of appetite -unusual bleeding or bruising This list may not describe all possible side effects. Call your doctor for medical advice about side effects. You may report side effects to FDA at 1-800-FDA-1088. Where should I keep my medicine? Keep out of the reach of children. Store at room temperature between 15 and 30 degrees C (59 and 86 degrees F). Protect from light. Throw away any unused medicine after the expiration date. NOTE: This sheet is a summary. It may not cover all possible information. If you have questions about this medicine, talk to your doctor, pharmacist, or health care provider.  2019 Elsevier/Gold Standard (2015-02-11 13:49:50) Peripheral Edema  Peripheral edema is swelling that is caused by a buildup of fluid. Peripheral edema most often affects the lower legs, ankles, and feet. It can also develop in the arms, hands, and face. The area of the body that has peripheral edema will look swollen. It may also feel heavy or warm. Your clothes may start to feel tight. Pressing on the area may make a temporary dent in your skin. You may not be able to move your arm or leg as much as usual. There are many causes of peripheral edema. It can be a complication of other diseases, such as congestive heart failure, kidney disease, or a problem with your blood circulation. It also can be a side effect of certain  medicines. It often happens to women during pregnancy. Sometimes, the cause is not known. Treating the underlying condition is often the only treatment for peripheral edema. Follow these instructions at home: Pay attention to any changes in your symptoms. Take these actions to help with your discomfort:  Raise (elevate) your legs while you are sitting or lying down.  Move around often to prevent stiffness and to lessen swelling. Do not sit or stand for long periods of time.  Wear support stockings as told by your health care provider.  Follow instructions from your health care provider about limiting salt (sodium) in your diet. Sometimes eating less salt can reduce swelling.  Take over-the-counter and prescription medicines only as told by your health care provider. Your health care provider may prescribe medicine to help your body get rid of excess water (diuretic).  Keep all follow-up visits as told by your health care provider. This is important. Contact a health care provider if:  You have a fever.  Your edema starts suddenly or is getting worse, especially if you are pregnant or have a medical condition.  You have swelling in only one leg.  You have increased swelling and pain in your legs. Get help right away if:  You develop shortness of breath, especially when you are lying down.  You have pain in  your chest or abdomen.  You feel weak.  You faint. This information is not intended to replace advice given to you by your health care provider. Make sure you discuss any questions you have with your health care provider. Document Released: 12/29/2004 Document Revised: 04/25/2016 Document Reviewed: 06/03/2015 Elsevier Interactive Patient Education  2019 [employer]. Acetaminophen; Butalbital; Caffeine tablets or capsules What is this medicine? ACETAMINOPHEN; BUTALBITAL; CAFFEINE (a set a MEE noe fen; byoo TAL bi tal; KAF een) is a pain reliever. It is used to treat tension  headaches. This medicine may be used for other purposes; ask your health care provider or pharmacist if you have questions. COMMON BRAND NAME(S): Alagesic, Americet, Anolor-300, Arcet, BAC, CAPACET, Dolgic Plus, Esgic, Esgic Plus, Ezol, Fioricet, [employer], Medigesic, [place], Pacaps, Phrenilin Forte, Repan, Carter, Triad, Zebutal What should I tell my health care provider before I take this medicine? They need to know if you have any of these conditions: -drug abuse or addiction -heart or circulation problems -if you often drink alcohol -kidney disease or problems going to the bathroom -liver disease -lung disease, asthma, or breathing problems -porphyria -an unusual or allergic reaction to acetaminophen, butalbital or other barbiturates, caffeine, other medicines, foods, dyes, or preservatives -pregnant or trying to get pregnant -breast-feeding How should I use this medicine? Take this medicine by mouth with a full glass of water. Follow the directions on the prescription label. If the medicine upsets your stomach, take the medicine with food or milk. Do not take more than you are told to take. Talk to your pediatrician regarding the use of this medicine in children. Special care may be needed. Overdosage: If you think you have taken too much of this medicine contact a poison control center or emergency room at once. NOTE: This medicine is only for you. Do not share this medicine with others. What if I miss a dose? If you miss a dose, take it as soon as you can. If it is almost time for your next dose, take only that dose. Do not take double or extra doses. What may interact with this medicine? -alcohol or medicines that contain alcohol -antidepressants, especially MAOIs like isocarboxazid, phenelzine, tranylcypromine, and selegiline -antihistamines -benzodiazepines -carbamazepine -isoniazid -medicines for pain like pentazocine, buprenorphine, butorphanol, nalbuphine, tramadol,  and propoxyphene -muscle relaxants -naltrexone -phenobarbital, phenytoin, and fosphenytoin -phenothiazines like perphenazine, thioridazine, chlorpromazine, mesoridazine, fluphenazine, prochlorperazine, promazine, and trifluoperazine -voriconazole This list may not describe all possible interactions. Give your health care provider a list of all the medicines, herbs, non-prescription drugs, or dietary supplements you use. Also tell them if you smoke, drink alcohol, or use illegal drugs. Some items may interact with your medicine. What should I watch for while using this medicine? Tell your doctor or health care professional if your pain does not go away, if it gets worse, or if you have new or a different type of pain. You may develop tolerance to the medicine. Tolerance means that you will need a higher dose of the medicine for pain relief. Tolerance is normal and is expected if you take the medicine for a long time. Do not suddenly stop taking your medicine because you may develop a severe reaction. Your body becomes used to the medicine. This does NOT mean you are addicted. Addiction is a behavior related to getting and using a drug for a non-medical reason. If you have pain, you have a medical reason to take pain medicine. Your doctor will tell you how much medicine to take.  If your doctor wants you to stop the medicine, the dose will be slowly lowered over time to avoid any side effects. You may get drowsy or dizzy when you first start taking the medicine or change doses. Do not drive, use machinery, or do anything that may be dangerous until you know how the medicine affects you. Stand or sit up slowly. Do not take other medicines that contain acetaminophen with this medicine. Always read labels carefully. If you have questions, ask your doctor or pharmacist. If you take too much acetaminophen get medical help right away. Too much acetaminophen can be very dangerous and cause liver damage. Even if  you do not have symptoms, it is important to get help right away. What side effects may I notice from receiving this medicine? Side effects that you should report to your doctor or health care professional as soon as possible: -allergic reactions like skin rash, itching or hives, swelling of the face, lips, or tongue -breathing problems -confusion -feeling faint or lightheaded, falls -redness, blistering, peeling or loosening of the skin, including inside the mouth -seizure -stomach pain -yellowing of the eyes or skin Side effects that usually do not require medical attention (report to your doctor or health care professional if they continue or are bothersome): -constipation -nausea, vomiting This list may not describe all possible side effects. Call your doctor for medical advice about side effects. You may report side effects to FDA at 1-800-FDA-1088. Where should I keep my medicine? Keep out of the reach of children. This medicine can be abused. Keep your medicine in a safe place to protect it from theft. Do not share this medicine with anyone. Selling or giving away this medicine is dangerous and against the law. This medicine may cause accidental overdose and death if it taken by other adults, children, or pets. Mix any unused medicine with a substance like cat litter or coffee grounds. Then throw the medicine away in a sealed container like a sealed bag or a coffee can with a lid. Do not use the medicine after the expiration date. Store at room temperature between 15 and 30 degrees C (59 and 86 degrees F). NOTE: This sheet is a summary. It may not cover all possible information. If you have questions about this medicine, talk to your doctor, pharmacist, or health care provider.  2019 Elsevier/Gold Standard (2014-01-17 15:00:25) Migraine Headache  A migraine headache is a very strong throbbing pain on one side or both sides of your head. Migraines can also cause other symptoms. Talk  with your doctor about what things may bring on (trigger) your migraine headaches. Follow these instructions at home: Medicines  Take over-the-counter and prescription medicines only as told by your doctor.  Do not drive or use heavy machinery while taking prescription pain medicine.  To prevent or treat constipation while you are taking prescription pain medicine, your doctor may recommend that you: ? Drink enough fluid to keep your pee (urine) clear or pale yellow. ? Take over-the-counter or prescription medicines. ? Eat foods that are high in fiber. These include fresh fruits and vegetables, whole grains, and beans. ? Limit foods that are high in fat and processed sugars. These include fried and sweet foods. Lifestyle  Avoid alcohol.  Do not use any products that contain nicotine or tobacco, such as cigarettes and e-cigarettes. If you need help quitting, ask your doctor.  Get at least 8 hours of sleep every night.  Limit your stress. General instructions   Keep  a journal to find out what may bring on your migraines. For example, write down: ? What you eat and drink. ? How much sleep you get. ? Any change in what you eat or drink. ? Any change in your medicines.  If you have a migraine: ? Avoid things that make your symptoms worse, such as bright lights. ? It may help to lie down in a dark, quiet room. ? Do not drive or use heavy machinery. ? Ask your doctor what activities are safe for you.  Keep all follow-up visits as told by your doctor. This is important. Contact a doctor if:  You get a migraine that is different or worse than your usual migraines. Get help right away if:  Your migraine gets very bad.  You have a fever.  You have a stiff neck.  You have trouble seeing.  Your muscles feel weak or like you cannot control them.  You start to lose your balance a lot.  You start to have trouble walking.  You pass out (faint). This information is not  intended to replace advice given to you by your health care provider. Make sure you discuss any questions you have with your health care provider. Document Released: 08/30/2008 Document Revised: 08/15/2018 Document Reviewed: 05/09/2016 Elsevier Interactive Patient Education  2019 [employer].

## 2019-04-17 NOTE — Progress Notes (Signed)
Patient [facility]   Sick Visit  Subjective:  Patient ID: Brianna Velez, female    DOB: 16-Dec-1975  Age: 43 y.o. MRN: [id number]  CC:  Chief Complaint  Patient presents with  . Leg Swelling    both legs and feet for 3 weeks    HPI Brianna Velez is a 43 year old female who presents for Sick Visit today.   Past Medical History:  Diagnosis Date  . Fibroids   . History of hiatal hernia   . Leg pain   . Migraine   . Sciatica    Current Status: Since her last office visit, she has had an ED visit on 04/01/2019 for bilateral leg edema. She has c/o increasing swelling in her legs X 3 weeks now. She is currently taking Lasix, but it is ineffective. Her migraines have increased. She takes Motrin for minimal relief. She has moderated anxiety today, r/t her health status. She denies suicidal ideations, homicidal ideations, or auditory hallucinations.  She denies fevers, chills, fatigue, recent infections, weight loss, and night sweats. She has not had any headaches, visual changes, dizziness, and falls. No chest pain, heart palpitations, cough and shortness of breath reported. No reports of GI problems such as nausea, vomiting, diarrhea, and constipation. She has no reports of blood in stools, dysuria and hematuria. She denies pain today.   Past Surgical History:  Procedure Laterality Date  . ABDOMINAL HYSTERECTOMY  09/29/2015   Procedure: HYSTERECTOMY ABDOMINAL;  Surgeon: Mora Bellman, MD;  Location: [facility];  Service: Gynecology;;  . BILATERAL SALPINGECTOMY Bilateral 09/29/2015   Procedure: BILATERAL SALPINGECTOMY;  Surgeon: Mora Bellman, MD;  Location: [facility];  Service: Gynecology;  Laterality: Bilateral;  . TUBAL LIGATION    . WISDOM TOOTH EXTRACTION      Family History  Problem Relation Age of Onset  . Diabetes Sister   . Heart disease Paternal Grandmother   . Hypertension Father   . Dementia Mother   . Hypertension Mother      Social History   Socioeconomic History  . Marital status: Single    Spouse name: Not on file  . Number of children: Not on file  . Years of education: Not on file  . Highest education level: Not on file  Occupational History  . Not on file  Social Needs  . Financial resource strain: Not on file  . Food insecurity:    Worry: Not on file    Inability: Not on file  . Transportation needs:    Medical: Not on file    Non-medical: Not on file  Tobacco Use  . Smoking status: Former Smoker    Last attempt to quit: 12/05/1996    Years since quitting: 22.3  . Smokeless tobacco: Never Used  Substance and Sexual Activity  . Alcohol use: No  . Drug use: No  . Sexual activity: Not on file  Lifestyle  . Physical activity:    Days per week: Not on file    Minutes per session: Not on file  . Stress: Not on file  Relationships  . Social connections:    Talks on phone: Not on file    Gets together: Not on file    Attends religious service: Not on file    Active member of club or organization: Not on file    Attends meetings of clubs or organizations: Not on file    Relationship status: Not on file  . Intimate partner violence:  Fear of current or ex partner: Not on file    Emotionally abused: Not on file    Physically abused: Not on file    Forced sexual activity: Not on file  Other Topics Concern  . Not on file  Social History Narrative  . Not on file    Outpatient Medications Prior to Visit  Medication Sig Dispense Refill  . busPIRone (BUSPAR) 10 MG tablet Take 1 tablet (10 mg total) by mouth 3 (three) times daily. 60 tablet 6  . cyclobenzaprine (FLEXERIL) 10 MG tablet Take 1 tablet (10 mg total) by mouth 3 (three) times daily as needed for muscle spasms. 30 tablet 3  . gabapentin (NEURONTIN) 300 MG capsule Take 1 capsule (300 mg total) by mouth 3 (three) times daily. 90 capsule 6  . meloxicam (MOBIC) 15 MG tablet Take 1 tablet (15 mg total) by mouth daily. 30 tablet 2   . pantoprazole (PROTONIX) 20 MG tablet Take 1 tablet (20 mg total) by mouth daily. 30 tablet 1  . furosemide (LASIX) 20 MG tablet Take 1 tablet (20 mg total) by mouth daily as needed. 30 tablet 6  . Vitamin D, Ergocalciferol, (DRISDOL) 1.25 MG (50000 UT) CAPS capsule Take 1 capsule (50,000 Units total) by mouth every 7 (seven) days. (Patient not taking: Reported on 04/17/2019) 5 capsule 3  . sulfamethoxazole-trimethoprim (BACTRIM DS,SEPTRA DS) 800-160 MG tablet Take 1 tablet by mouth 2 (two) times daily. 14 tablet 0   No facility-administered medications prior to visit.     Allergies  Allergen Reactions  . Prednisone     Insomnia "it has me up all night"  . Hydrocodone Rash    ROS Review of Systems  Constitutional: Negative.   HENT: Negative.   Eyes: Negative.   Respiratory: Negative.   Cardiovascular: Negative.   Gastrointestinal: Positive for abdominal distention (obese; bloating.).  Endocrine: Negative.   Genitourinary: Negative.   Musculoskeletal: Negative.   Skin: Negative.   Allergic/Immunologic: Negative.   Neurological: Negative.   Hematological: Negative.   Psychiatric/Behavioral: Negative.       Objective:    Physical Exam  Constitutional: She is oriented to person, place, and time. She appears well-developed and well-nourished.  HENT:  Head: Normocephalic and atraumatic.  Eyes: Conjunctivae are normal.  Neck: Normal range of motion. Neck supple.  Cardiovascular: Normal rate, regular rhythm, normal heart sounds and intact distal pulses.  Pulmonary/Chest: Effort normal and breath sounds normal.  Abdominal: Soft. Bowel sounds are normal.  Musculoskeletal: Normal range of motion.  Neurological: She is alert and oriented to person, place, and time. She has normal reflexes.  Skin: Skin is warm and dry.  Psychiatric: She has a normal mood and affect. Her behavior is normal. Judgment and thought content normal.  Nursing note and vitals reviewed.   BP 118/68 (BP  Location: Left Arm, Patient Position: Sitting, Cuff Size: Small)   Pulse 98   Temp 97.9 F (36.6 C) (Oral)   Ht 5\' 4"  (1.626 m)   Wt 178 lb (80.7 kg)   LMP 08/14/2016   SpO2 100%   BMI 30.55 kg/m  Wt Readings from Last 3 Encounters:  04/17/19 178 lb (80.7 kg)  04/01/19 166 lb (75.3 kg)  02/22/19 166 lb (75.3 kg)     Health Maintenance Due  Topic Date Due  . PAP SMEAR-Modifier  04/30/2017    There are no preventive care reminders to display for this patient.  Lab Results  Component Value Date   TSH 2.310 02/22/2019  Lab Results  Component Value Date   WBC 3.9 02/22/2019   HGB 14.0 02/22/2019   HCT 41.8 02/22/2019   MCV 92 02/22/2019   PLT 173 02/22/2019   Lab Results  Component Value Date   NA 142 04/01/2019   K 4.0 04/01/2019   CO2 31 04/01/2019   GLUCOSE 107 (H) 04/01/2019   BUN 13 04/01/2019   CREATININE 0.86 04/01/2019   BILITOT <0.2 02/22/2019   ALKPHOS 70 02/22/2019   AST 25 02/22/2019   ALT 28 02/22/2019   PROT 7.4 02/22/2019   ALBUMIN 4.5 02/22/2019   CALCIUM 9.6 04/01/2019   ANIONGAP 11 04/01/2019   Lab Results  Component Value Date   CHOL 217 (H) 02/22/2019   Lab Results  Component Value Date   HDL 42 02/22/2019   Lab Results  Component Value Date   LDLCALC 138 (H) 02/22/2019   Lab Results  Component Value Date   TRIG 184 (H) 02/22/2019   Lab Results  Component Value Date   CHOLHDL 5.2 (H) 02/22/2019   No results found for: HGBA1C   Assessment & Plan:   1. Peripheral edema     We will increase Lasix to 40 mg BID today. Patient will report to office if she continues to have increased fluid. She is encouraged to weigh herself daily, and report to office if she has a 5 lb weight gain.  - furosemide (LASIX) 40 MG tablet; Take 1 tablet (40 mg total) by mouth daily.  Dispense: 30 tablet; Refill: 1  2. Abdominal distension (gaseous)  3. Abdominal swelling, generalized Keep appointment for US Abdomen on 05/15/2019.   4.  Flatulence/gas pain/belching  5. Intractable migraine without status migrainosus, unspecified migraine type We will initiate Fioricet today. She will take medication if Motrin and Acetaminophen are not effective in relieving headache.  - butalbital-acetaminophen-caffeine (FIORICET) 50-325-40 MG tablet; Take 1-2 tablets by mouth every 6 (six) hours as needed for headache.  Dispense: 20 tablet; Refill: 0  6. Follow up She will follow up in 1 month.   Meds ordered this encounter  Medications  . DISCONTD: furosemide (LASIX) 40 MG tablet    Sig: Take 1 tablet (40 mg total) by mouth daily.    Dispense:  30 tablet    Refill:  1  . butalbital-acetaminophen-caffeine (FIORICET) 50-325-40 MG tablet    Sig: Take 1-2 tablets by mouth every 6 (six) hours as needed for headache.    Dispense:  20 tablet    Refill:  0  . furosemide (LASIX) 40 MG tablet    Sig: Take 1 tablet (40 mg total) by mouth 2 (two) times daily.    Dispense:  60 tablet    Refill:  3    No orders of the defined types were placed in this encounter.   Referral Orders  No referral(s) requested today    Kathe Becton,  MSN, FNP-C [facility] [place],  [phone]    Problem List Items Addressed This Visit    None    Visit Diagnoses    Peripheral edema    -  Primary   Abdominal distension (gaseous)       Abdominal swelling, generalized       Flatulence/gas pain/belching       Intractable migraine without status migrainosus, unspecified migraine type       Relevant Medications   butalbital-acetaminophen-caffeine (FIORICET) 50-325-40 MG tablet   furosemide (LASIX) 40 MG tablet  Follow up          Meds ordered this encounter  Medications  . DISCONTD: furosemide (LASIX) 40 MG tablet    Sig: Take 1 tablet (40 mg total) by mouth daily.    Dispense:  30 tablet    Refill:  1  . butalbital-acetaminophen-caffeine (FIORICET) 50-325-40 MG tablet     Sig: Take 1-2 tablets by mouth every 6 (six) hours as needed for headache.    Dispense:  20 tablet    Refill:  0  . furosemide (LASIX) 40 MG tablet    Sig: Take 1 tablet (40 mg total) by mouth 2 (two) times daily.    Dispense:  60 tablet    Refill:  3    Follow-up: No follow-ups on file.    Azzie Glatter, FNP

## 2019-04-18 DIAGNOSIS — G43919 Migraine, unspecified, intractable, without status migrainosus: Secondary | ICD-10-CM | POA: Insufficient documentation

## 2019-04-18 DIAGNOSIS — R140 Abdominal distension (gaseous): Secondary | ICD-10-CM | POA: Insufficient documentation

## 2019-04-18 DIAGNOSIS — R609 Edema, unspecified: Secondary | ICD-10-CM | POA: Insufficient documentation

## 2019-04-18 DIAGNOSIS — R600 Localized edema: Secondary | ICD-10-CM | POA: Insufficient documentation

## 2019-04-18 MED ORDER — FUROSEMIDE 40 MG PO TABS: 40.0000 mg | ORAL_TABLET | Freq: Two times a day (BID) | ORAL | 3 refills | Status: DC

## 2019-04-19 ENCOUNTER — Telehealth: Payer: Self-pay

## 2019-04-19 ENCOUNTER — Other Ambulatory Visit: Payer: Self-pay | Admitting: Family Medicine

## 2019-04-19 DIAGNOSIS — M792 Neuralgia and neuritis, unspecified: Secondary | ICD-10-CM

## 2019-04-19 DIAGNOSIS — R232 Flushing: Secondary | ICD-10-CM

## 2019-04-19 MED ORDER — GABAPENTIN 100 MG PO CAPS: ORAL_CAPSULE | ORAL | 3 refills | Status: DC

## 2019-04-19 NOTE — Telephone Encounter (Signed)
Patient would like to have something in for hot flashes.

## 2019-05-08 ENCOUNTER — Other Ambulatory Visit: Payer: Self-pay

## 2019-05-08 ENCOUNTER — Encounter: Payer: Self-pay | Admitting: Family Medicine

## 2019-05-08 ENCOUNTER — Ambulatory Visit (INDEPENDENT_AMBULATORY_CARE_PROVIDER_SITE_OTHER): Payer: Medicaid Other | Admitting: Family Medicine

## 2019-05-08 VITALS — BP 124/72 | HR 98 | Temp 97.9°F | Ht 64.0 in | Wt 178.0 lb

## 2019-05-08 DIAGNOSIS — R609 Edema, unspecified: Secondary | ICD-10-CM | POA: Diagnosis not present

## 2019-05-08 DIAGNOSIS — F329 Major depressive disorder, single episode, unspecified: Secondary | ICD-10-CM

## 2019-05-08 DIAGNOSIS — M792 Neuralgia and neuritis, unspecified: Secondary | ICD-10-CM | POA: Diagnosis not present

## 2019-05-08 DIAGNOSIS — Z09 Encounter for follow-up examination after completed treatment for conditions other than malignant neoplasm: Secondary | ICD-10-CM | POA: Diagnosis not present

## 2019-05-08 DIAGNOSIS — R140 Abdominal distension (gaseous): Secondary | ICD-10-CM

## 2019-05-08 DIAGNOSIS — F419 Anxiety disorder, unspecified: Secondary | ICD-10-CM | POA: Insufficient documentation

## 2019-05-08 DIAGNOSIS — Z748 Other problems related to care provider dependency: Secondary | ICD-10-CM | POA: Diagnosis not present

## 2019-05-08 DIAGNOSIS — R600 Localized edema: Secondary | ICD-10-CM

## 2019-05-08 DIAGNOSIS — F32A Depression, unspecified: Secondary | ICD-10-CM

## 2019-05-08 MED ORDER — GABAPENTIN 300 MG PO CAPS: 300.0000 mg | ORAL_CAPSULE | Freq: Three times a day (TID) | ORAL | 3 refills | Status: DC

## 2019-05-08 MED ORDER — DULOXETINE HCL 20 MG PO CPEP: 20.0000 mg | ORAL_CAPSULE | Freq: Every day | ORAL | 3 refills | Status: DC

## 2019-05-08 NOTE — Patient Instructions (Signed)
Duloxetine delayed-release capsules What is this medicine? DULOXETINE (doo LOX e teen) is used to treat depression, anxiety, and different types of chronic pain. This medicine may be used for other purposes; ask your health care provider or pharmacist if you have questions. COMMON BRAND NAME(S): Cymbalta, Irenka What should I tell my health care provider before I take this medicine? They need to know if you have any of these conditions: -bipolar disorder or a family history of bipolar disorder -glaucoma -kidney disease -liver disease -suicidal thoughts or a previous suicide attempt -taken medicines called MAOIs like Carbex, Eldepryl, Marplan, Nardil, and Parnate within 14 days -an unusual reaction to duloxetine, other medicines, foods, dyes, or preservatives -pregnant or trying to get pregnant -breast-feeding How should I use this medicine? Take this medicine by mouth with a glass of water. Follow the directions on the prescription label. Do not cut, crush or chew this medicine. You can take this medicine with or without food. Take your medicine at regular intervals. Do not take your medicine more often than directed. Do not stop taking this medicine suddenly except upon the advice of your doctor. Stopping this medicine too quickly may cause serious side effects or your condition may worsen. A special MedGuide will be given to you by the pharmacist with each prescription and refill. Be sure to read this information carefully each time. Talk to your pediatrician regarding the use of this medicine in children. While this drug may be prescribed for children as young as 7 years of age for selected conditions, precautions do apply. Overdosage: If you think you have taken too much of this medicine contact a poison control center or emergency room at once. NOTE: This medicine is only for you. Do not share this medicine with others. What if I miss a dose? If you miss a dose, take it as soon as you  can. If it is almost time for your next dose, take only that dose. Do not take double or extra doses. What may interact with this medicine? Do not take this medicine with any of the following medications: -desvenlafaxine -levomilnacipran -linezolid -MAOIs like Carbex, Eldepryl, Marplan, Nardil, and Parnate -methylene blue (injected into a vein) -milnacipran -thioridazine -venlafaxine This medicine may also interact with the following medications: -alcohol -amphetamines -aspirin and aspirin-like medicines -certain antibiotics like ciprofloxacin and enoxacin -certain medicines for blood pressure, heart disease, irregular heart beat -certain medicines for depression, anxiety, or psychotic disturbances -certain medicines for migraine headache like almotriptan, eletriptan, frovatriptan, naratriptan, rizatriptan, sumatriptan, zolmitriptan -certain medicines that treat or prevent blood clots like warfarin, enoxaparin, and dalteparin -cimetidine -fentanyl -lithium -NSAIDS, medicines for pain and inflammation, like ibuprofen or naproxen -phentermine -procarbazine -rasagiline -sibutramine -St. John's wort -theophylline -tramadol -tryptophan This list may not describe all possible interactions. Give your health care provider a list of all the medicines, herbs, non-prescription drugs, or dietary supplements you use. Also tell them if you smoke, drink alcohol, or use illegal drugs. Some items may interact with your medicine. What should I watch for while using this medicine? Tell your doctor if your symptoms do not get better or if they get worse. Visit your doctor or health care professional for regular checks on your progress. Because it may take several weeks to see the full effects of this medicine, it is important to continue your treatment as prescribed by your doctor. Patients and their families should watch out for new or worsening thoughts of suicide or depression. Also watch out for  sudden changes in   feelings such as feeling anxious, agitated, panicky, irritable, hostile, aggressive, impulsive, severely restless, overly excited and hyperactive, or not being able to sleep. If this happens, especially at the beginning of treatment or after a change in dose, call your health care professional. Dennis Bast may get drowsy or dizzy. Do not drive, use machinery, or do anything that needs mental alertness until you know how this medicine affects you. Do not stand or sit up quickly, especially if you are an older patient. This reduces the risk of dizzy or fainting spells. Alcohol may interfere with the effect of this medicine. Avoid alcoholic drinks. This medicine can cause an increase in blood pressure. This medicine can also cause a sudden drop in your blood pressure, which may make you feel faint and increase the chance of a fall. These effects are most common when you first start the medicine or when the dose is increased, or during use of other medicines that can cause a sudden drop in blood pressure. Check with your doctor for instructions on monitoring your blood pressure while taking this medicine. Your mouth may get dry. Chewing sugarless gum or sucking hard candy, and drinking plenty of water may help. Contact your doctor if the problem does not go away or is severe. What side effects may I notice from receiving this medicine? Side effects that you should report to your doctor or health care professional as soon as possible: -allergic reactions like skin rash, itching or hives, swelling of the face, lips, or tongue -anxious -breathing problems -confusion -changes in vision -chest pain -confusion -elevated mood, decreased need for sleep, racing thoughts, impulsive behavior -eye pain -fast, irregular heartbeat -feeling faint or lightheaded, falls -feeling agitated, angry, or irritable -hallucination, loss of contact with reality -high blood pressure -loss of balance or coordination  -palpitations -redness, blistering, peeling or loosening of the skin, including inside the mouth -restlessness, pacing, inability to keep still -seizures -stiff muscles -suicidal thoughts or other mood changes -trouble passing urine or change in the amount of urine -trouble sleeping -unusual bleeding or bruising -unusually weak or tired -vomiting -yellowing of the eyes or skin Side effects that usually do not require medical attention (report to your doctor or health care professional if they continue or are bothersome): -change in sex drive or performance -change in appetite or weight -constipation -dizziness -dry mouth -headache -increased sweating -nausea -tired This list may not describe all possible side effects. Call your doctor for medical advice about side effects. You may report side effects to FDA at 1-800-FDA-1088. Where should I keep my medicine? Keep out of the reach of children. Store at room temperature between 20 and 25 degrees C (68 to 77 degrees F). Throw away any unused medicine after the expiration date. NOTE: This sheet is a summary. It may not cover all possible information. If you have questions about this medicine, talk to your doctor, pharmacist, or health care provider.  2019 Elsevier/Gold Standard (2016-04-21 18:16:03) Gabapentin capsules or tablets What is this medicine? GABAPENTIN (GA ba pen tin) is used to control seizures in certain types of epilepsy. It is also used to treat certain types of nerve pain. This medicine may be used for other purposes; ask your health care provider or pharmacist if you have questions. COMMON BRAND NAME(S): Active-PAC with Gabapentin, Gabarone, Neurontin What should I tell my health care provider before I take this medicine? They need to know if you have any of these conditions: -kidney disease -suicidal thoughts, plans, or attempt; a previous  suicide attempt by you or a family member -an unusual or allergic reaction  to gabapentin, other medicines, foods, dyes, or preservatives -pregnant or trying to get pregnant -breast-feeding How should I use this medicine? Take this medicine by mouth with a glass of water. Follow the directions on the prescription label. You can take it with or without food. If it upsets your stomach, take it with food. Take your medicine at regular intervals. Do not take it more often than directed. Do not stop taking except on your doctor's advice. If you are directed to break the 600 or 800 mg tablets in half as part of your dose, the extra half tablet should be used for the next dose. If you have not used the extra half tablet within 28 days, it should be thrown away. A special MedGuide will be given to you by the pharmacist with each prescription and refill. Be sure to read this information carefully each time. Talk to your pediatrician regarding the use of this medicine in children. While this drug may be prescribed for children as young as 3 years for selected conditions, precautions do apply. Overdosage: If you think you have taken too much of this medicine contact a poison control center or emergency room at once. NOTE: This medicine is only for you. Do not share this medicine with others. What if I miss a dose? If you miss a dose, take it as soon as you can. If it is almost time for your next dose, take only that dose. Do not take double or extra doses. What may interact with this medicine? Do not take this medicine with any of the following medications: -other gabapentin products This medicine may also interact with the following medications: -alcohol -antacids -antihistamines for allergy, cough and cold -certain medicines for anxiety or sleep -certain medicines for depression or psychotic disturbances -homatropine; hydrocodone -naproxen -narcotic medicines (opiates) for pain -phenothiazines like chlorpromazine, mesoridazine, prochlorperazine, thioridazine This list may  not describe all possible interactions. Give your health care provider a list of all the medicines, herbs, non-prescription drugs, or dietary supplements you use. Also tell them if you smoke, drink alcohol, or use illegal drugs. Some items may interact with your medicine. What should I watch for while using this medicine? Visit your doctor or health care professional for regular checks on your progress. You may want to keep a record at home of how you feel your condition is responding to treatment. You may want to share this information with your doctor or health care professional at each visit. You should contact your doctor or health care professional if your seizures get worse or if you have any new types of seizures. Do not stop taking this medicine or any of your seizure medicines unless instructed by your doctor or health care professional. Stopping your medicine suddenly can increase your seizures or their severity. Wear a medical identification bracelet or chain if you are taking this medicine for seizures, and carry a card that lists all your medications. You may get drowsy, dizzy, or have blurred vision. Do not drive, use machinery, or do anything that needs mental alertness until you know how this medicine affects you. To reduce dizzy or fainting spells, do not sit or stand up quickly, especially if you are an older patient. Alcohol can increase drowsiness and dizziness. Avoid alcoholic drinks. Your mouth may get dry. Chewing sugarless gum or sucking hard candy, and drinking plenty of water will help. The use of this medicine may  increase the chance of suicidal thoughts or actions. Pay special attention to how you are responding while on this medicine. Any worsening of mood, or thoughts of suicide or dying should be reported to your health care professional right away. Women who become pregnant while using this medicine may enroll in the [place] Pregnancy Registry by  calling [phone]. This registry collects information about the safety of antiepileptic drug use during pregnancy. What side effects may I notice from receiving this medicine? Side effects that you should report to your doctor or health care professional as soon as possible: -allergic reactions like skin rash, itching or hives, swelling of the face, lips, or tongue -worsening of mood, thoughts or actions of suicide or dying Side effects that usually do not require medical attention (report to your doctor or health care professional if they continue or are bothersome): -constipation -difficulty walking or controlling muscle movements -dizziness -nausea -slurred speech -tiredness -tremors -weight gain This list may not describe all possible side effects. Call your doctor for medical advice about side effects. You may report side effects to FDA at 1-800-FDA-1088. Where should I keep my medicine? Keep out of reach of children. This medicine may cause accidental overdose and death if it taken by other adults, children, or pets. Mix any unused medicine with a substance like cat litter or coffee grounds. Then throw the medicine away in a sealed container like a sealed bag or a coffee can with a lid. Do not use the medicine after the expiration date. Store at room temperature between 15 and 30 degrees C (59 and 86 degrees F). NOTE: This sheet is a summary. It may not cover all possible information. If you have questions about this medicine, talk to your doctor, pharmacist, or health care provider.  2019 Elsevier/Gold Standard (2018-04-26 13:21:44) Edema  Edema is when you have too much fluid in your body or under your skin. Edema may make your legs, feet, and ankles swell up. Swelling is also common in looser tissues, like around your eyes. This is a common condition. It gets more common as you get older. There are many possible causes of edema. Eating too much salt (sodium) and being on your  feet or sitting for a long time can cause edema in your legs, feet, and ankles. Hot weather may make edema worse. Edema is usually painless. Your skin may look swollen or shiny. Follow these instructions at home:  Keep the swollen body part raised (elevated) above the level of your heart when you are sitting or lying down.  Do not sit still or stand for a long time.  Do not wear tight clothes. Do not wear garters on your upper legs.  Exercise your legs. This can help the swelling go down.  Wear elastic bandages or support stockings as told by your doctor.  Eat a low-salt (low-sodium) diet to reduce fluid as told by your doctor.  Depending on the cause of your swelling, you may need to limit how much fluid you drink (fluid restriction).  Take over-the-counter and prescription medicines only as told by your doctor. Contact a doctor if:  Treatment is not working.  You have heart, liver, or kidney disease and have symptoms of edema.  You have sudden and unexplained weight gain. Get help right away if:  You have shortness of breath or chest pain.  You cannot breathe when you lie down.  You have pain, redness, or warmth in the swollen areas.  You have  heart, liver, or kidney disease and get edema all of a sudden.  You have a fever and your symptoms get worse all of a sudden. Summary  Edema is when you have too much fluid in your body or under your skin.  Edema may make your legs, feet, and ankles swell up. Swelling is also common in looser tissues, like around your eyes.  Raise (elevate) the swollen body part above the level of your heart when you are sitting or lying down.  Follow your doctor's instructions about diet and how much fluid you can drink (fluid restriction). This information is not intended to replace advice given to you by your health care provider. Make sure you discuss any questions you have with your health care provider. Document Released: 05/09/2008  Document Revised: 12/09/2016 Document Reviewed: 12/09/2016 Elsevier Interactive Patient Education  2019 [employer]. Neuropathic Pain Neuropathic pain is pain caused by damage to the nerves that are responsible for certain sensations in your body (sensory nerves). The pain can be caused by:  Damage to the sensory nerves that send signals to your spinal cord and brain (peripheral nervous system).  Damage to the sensory nerves in your brain or spinal cord (central nervous system). Neuropathic pain can make you more sensitive to pain. Even a minor sensation can feel very painful. This is usually a long-term condition that can be difficult to treat. The type of pain differs from person to person. It may:  Start suddenly (acute), or it may develop slowly and last for a long time (chronic).  Come and go as damaged nerves heal, or it may stay at the same level for years.  Cause emotional distress, loss of sleep, and a lower quality of life. What are the causes? The most common cause of this condition is diabetes. Many other diseases and conditions can also cause neuropathic pain. Causes of neuropathic pain can be classified as:  Toxic. This is caused by medicines and chemicals. The most common cause of toxic neuropathic pain is damage from cancer treatments (chemotherapy).  Metabolic. This can be caused by: ? Diabetes. This is the most common disease that damages the nerves. ? Lack of vitamin B from long-term alcohol abuse.  Traumatic. Any injury that cuts, crushes, or stretches a nerve can cause damage and pain. A common example is feeling pain after losing an arm or leg (phantom limb pain).  Compression-related. If a sensory nerve gets trapped or compressed for a long period of time, the blood supply to the nerve can be cut off.  Vascular. Many blood vessel diseases can cause neuropathic pain by decreasing blood supply and oxygen to nerves.  Autoimmune. This type of pain results from  diseases in which the body's defense system (immune system) mistakenly attacks sensory nerves. Examples of autoimmune diseases that can cause neuropathic pain include lupus and multiple sclerosis.  Infectious. Many types of viral infections can damage sensory nerves and cause pain. Shingles infection is a common cause of this type of pain.  Inherited. Neuropathic pain can be a symptom of many diseases that are passed down through families (genetic). What increases the risk? You are more likely to develop this condition if:  You have diabetes.  You smoke.  You drink too much alcohol.  You are taking certain medicines, including medicines that kill cancer cells (chemotherapy) or that treat immune system disorders. What are the signs or symptoms? The main symptom is pain. Neuropathic pain is often described as:  Burning.  Shock-like.  Stinging.  Hot or cold.  Itching. How is this diagnosed? No single test can diagnose neuropathic pain. It is diagnosed based on:  Physical exam and your symptoms. Your health care provider will ask you about your pain. You may be asked to use a pain scale to describe how bad your pain is.  Tests. These may be done to see if you have a high sensitivity to pain and to help find the cause and location of any sensory nerve damage. They include: ? Nerve conduction studies to test how well nerve signals travel through your sensory nerves (electrodiagnostic testing). ? Stimulating your sensory nerves through electrodes on your skin and measuring the response in your spinal cord and brain (somatosensory evoked potential).  Imaging studies, such as: ? X-rays. ? CT scan. ? MRI. How is this treated? Treatment for neuropathic pain may change over time. You may need to try different treatment options or a combination of treatments. Some options include:  Treating the underlying cause of the neuropathy, such as diabetes, kidney disease, or vitamin  deficiencies.  Stopping medicines that can cause neuropathy, such as chemotherapy.  Medicine to relieve pain. Medicines may include: ? Prescription or over-the-counter pain medicine. ? Anti-seizure medicine. ? Antidepressant medicines. ? Pain-relieving patches that are applied to painful areas of skin. ? A medicine to numb the area (local anesthetic), which can be injected as a nerve block.  Transcutaneous nerve stimulation. This uses electrical currents to block painful nerve signals. The treatment is painless.  Alternative treatments, such as: ? Acupuncture. ? Meditation. ? Massage. ? Physical therapy. ? Pain management programs. ? Counseling. Follow these instructions at home: Medicines   Take over-the-counter and prescription medicines only as told by your health care provider.  Do not drive or use heavy machinery while taking prescription pain medicine.  If you are taking prescription pain medicine, take actions to prevent or treat constipation. Your health care provider may recommend that you: ? Drink enough fluid to keep your urine pale yellow. ? Eat foods that are high in fiber, such as fresh fruits and vegetables, whole grains, and beans. ? Limit foods that are high in fat and processed sugars, such as fried or sweet foods. ? Take an over-the-counter or prescription medicine for constipation. Lifestyle   Have a good support system at home.  Consider joining a chronic pain support group.  Do not use any products that contain nicotine or tobacco, such as cigarettes and e-cigarettes. If you need help quitting, ask your health care provider.  Do not drink alcohol. General instructions  Learn as much as you can about your condition.  Work closely with all your health care providers to find the treatment plan that works best for you.  Ask your health care provider what activities are safe for you.  Keep all follow-up visits as told by your health care provider.  This is important. Contact a health care provider if:  Your pain treatments are not working.  You are having side effects from your medicines.  You are struggling with tiredness (fatigue), mood changes, depression, or anxiety. Summary  Neuropathic pain is pain caused by damage to the nerves that are responsible for certain sensations in your body (sensory nerves).  Neuropathic pain may come and go as damaged nerves heal, or it may stay at the same level for years.  Neuropathic pain is usually a long-term condition that can be difficult to treat. Consider joining a chronic pain support group. This information is not  intended to replace advice given to you by your health care provider. Make sure you discuss any questions you have with your health care provider. Document Released: 08/18/2004 Document Revised: 12/08/2017 Document Reviewed: 12/08/2017 Elsevier Interactive Patient Education  2019 [employer].

## 2019-05-08 NOTE — Progress Notes (Signed)
Consulted by PCP for transportation needs. CSW met with patient and left message for patient's Medicaid case worker to request assistance in setting up Medicaid transportation for patient.

## 2019-05-08 NOTE — Progress Notes (Signed)
Patient [facility]   Sick Visit  Subjective:  Patient ID: Brianna Velez, female    DOB: [birth year]  Age: 43 y.o. MRN: [id number]  CC:  Chief Complaint  Patient presents with  . Follow-up    Leg swelling and burning     HPI Brianna Velez is a 43 year old female who presents for Follow Up today.   Past Medical History:  Diagnosis Date  . Fibroids   . History of hiatal hernia   . Leg pain   . Migraine   . Sciatica    Current Status: Since her last office visit, she continues to have increased lower extremity swelling although we increased her Lasix dosage. Today, she has c/o increased swelling, numbness, and pain in lower legs. She is currently taking Gabapentin 400 mg BID, which is not effective. She states that she also elevates her legs often to aide in relief of swelling. Her anxiety is increase today because of her medical condition. She denies suicidal ideations, homicidal ideations, or auditory hallucinations. She continues to experience 'hot flashes.' She continues to have GI problems, which she is scheduled for Abdominal US on 05/15/2019, and states that she does not have a ride to appointment. She reports occasional nausea, vomiting, diarrhea, and constipation. She has no reports of blood in stools, dysuria and hematuria.   She denies fevers, chills, fatigue, recent infections, weight loss, and night sweats. She has not had any headaches, visual changes, dizziness, and falls. No chest pain, heart palpitations, cough and shortness of breath reported.   Past Surgical History:  Procedure Laterality Date  . ABDOMINAL HYSTERECTOMY  09/29/2015   Procedure: HYSTERECTOMY ABDOMINAL;  Surgeon: Mora Bellman, MD;  Location: [facility];  Service: Gynecology;;  . BILATERAL SALPINGECTOMY Bilateral 09/29/2015   Procedure: BILATERAL SALPINGECTOMY;  Surgeon: Mora Bellman, MD;  Location: [facility];  Service: Gynecology;  Laterality: Bilateral;  . TUBAL  LIGATION    . WISDOM TOOTH EXTRACTION      Family History  Problem Relation Age of Onset  . Diabetes Sister   . Heart disease Paternal Grandmother   . Hypertension Father   . Dementia Mother   . Hypertension Mother     Social History   Socioeconomic History  . Marital status: Single    Spouse name: Not on file  . Number of children: Not on file  . Years of education: Not on file  . Highest education level: Not on file  Occupational History  . Not on file  Social Needs  . Financial resource strain: Not on file  . Food insecurity:    Worry: Not on file    Inability: Not on file  . Transportation needs:    Medical: Not on file    Non-medical: Not on file  Tobacco Use  . Smoking status: Former Smoker    Last attempt to quit: 12/05/1996    Years since quitting: 22.4  . Smokeless tobacco: Never Used  Substance and Sexual Activity  . Alcohol use: No  . Drug use: No  . Sexual activity: Not on file  Lifestyle  . Physical activity:    Days per week: Not on file    Minutes per session: Not on file  . Stress: Not on file  Relationships  . Social connections:    Talks on phone: Not on file    Gets together: Not on file    Attends religious service: Not on file    Active  member of club or organization: Not on file    Attends meetings of clubs or organizations: Not on file    Relationship status: Not on file  . Intimate partner violence:    Fear of current or ex partner: Not on file    Emotionally abused: Not on file    Physically abused: Not on file    Forced sexual activity: Not on file  Other Topics Concern  . Not on file  Social History Narrative  . Not on file    Outpatient Medications Prior to Visit  Medication Sig Dispense Refill  . busPIRone (BUSPAR) 10 MG tablet Take 1 tablet (10 mg total) by mouth 3 (three) times daily. 60 tablet 6  . butalbital-acetaminophen-caffeine (FIORICET) 50-325-40 MG tablet Take 1-2 tablets by mouth every 6 (six) hours as needed for  headache. 20 tablet 0  . cyclobenzaprine (FLEXERIL) 10 MG tablet Take 1 tablet (10 mg total) by mouth 3 (three) times daily as needed for muscle spasms. 30 tablet 3  . furosemide (LASIX) 40 MG tablet Take 1 tablet (40 mg total) by mouth 2 (two) times daily. 60 tablet 3  . meloxicam (MOBIC) 15 MG tablet Take 1 tablet (15 mg total) by mouth daily. 30 tablet 2  . pantoprazole (PROTONIX) 20 MG tablet Take 1 tablet (20 mg total) by mouth daily. 30 tablet 1  . gabapentin (NEURONTIN) 100 MG capsule Take 2 capsules (400 mg= total), 3 times a day. 180 capsule 3  . Vitamin D, Ergocalciferol, (DRISDOL) 1.25 MG (50000 UT) CAPS capsule Take 1 capsule (50,000 Units total) by mouth every 7 (seven) days. (Patient not taking: Reported on 04/17/2019) 5 capsule 3   No facility-administered medications prior to visit.     Allergies  Allergen Reactions  . Prednisone     Insomnia "it has me up all night"  . Hydrocodone Rash    ROS Review of Systems  Constitutional: Negative.   HENT: Negative.   Eyes: Negative.   Respiratory: Negative.   Cardiovascular: Positive for leg swelling (1+ bilateral lower extremity edema).  Gastrointestinal: Positive for abdominal distention, constipation (Occasional) and nausea (Occasional).  Endocrine: Negative.   Genitourinary: Negative.   Musculoskeletal: Positive for arthralgias (Generalized).  Skin: Negative.   Allergic/Immunologic: Negative.   Neurological: Positive for numbness (pain, tingling in feet).  Hematological: Negative.   Psychiatric/Behavioral:       Increased anxiety today.    Objective:    Physical Exam  Constitutional: She is oriented to person, place, and time. She appears well-developed and well-nourished.  HENT:  Head: Normocephalic and atraumatic.  Eyes: Conjunctivae are normal.  Neck: Normal range of motion. Neck supple.  Cardiovascular: Normal rate, regular rhythm, normal heart sounds and intact distal pulses.  Pulmonary/Chest: Effort normal  and breath sounds normal.  Abdominal: Soft. Bowel sounds are normal. She exhibits distension.  Musculoskeletal: Normal range of motion.  Neurological: She is alert and oriented to person, place, and time. She has normal reflexes.  Skin: Skin is warm and dry.  Psychiatric:  Increased anxiety today.   Nursing note and vitals reviewed.   BP 124/72 (BP Location: Left Arm, Patient Position: Sitting, Cuff Size: Large)   Pulse 98   Temp 97.9 F (36.6 C) (Oral)   Ht 5\' 4"  (1.626 m)   Wt 178 lb (80.7 kg)   LMP 08/14/2016   SpO2 100%   BMI 30.55 kg/m  Wt Readings from Last 3 Encounters:  05/08/19 178 lb (80.7 kg)  04/17/19 178 lb (  80.7 kg)  04/01/19 166 lb (75.3 kg)     Health Maintenance Due  Topic Date Due  . PAP SMEAR-Modifier  04/30/2017    There are no preventive care reminders to display for this patient.  Lab Results  Component Value Date   TSH 2.310 02/22/2019   Lab Results  Component Value Date   WBC 3.9 02/22/2019   HGB 14.0 02/22/2019   HCT 41.8 02/22/2019   MCV 92 02/22/2019   PLT 173 02/22/2019   Lab Results  Component Value Date   NA 142 04/01/2019   K 4.0 04/01/2019   CO2 31 04/01/2019   GLUCOSE 107 (H) 04/01/2019   BUN 13 04/01/2019   CREATININE 0.86 04/01/2019   BILITOT <0.2 02/22/2019   ALKPHOS 70 02/22/2019   AST 25 02/22/2019   ALT 28 02/22/2019   PROT 7.4 02/22/2019   ALBUMIN 4.5 02/22/2019   CALCIUM 9.6 04/01/2019   ANIONGAP 11 04/01/2019   Lab Results  Component Value Date   CHOL 217 (H) 02/22/2019   Lab Results  Component Value Date   HDL 42 02/22/2019   Lab Results  Component Value Date   LDLCALC 138 (H) 02/22/2019   Lab Results  Component Value Date   TRIG 184 (H) 02/22/2019   Lab Results  Component Value Date   CHOLHDL 5.2 (H) 02/22/2019   No results found for: HGBA1C    Assessment & Plan:   1. Peripheral edema 1+ bilateral lower extremity edema. She will continue Lasix as prescribed, and elevating legs as  needed.   2. Neuropathic pain We will increase dosage of Gabapentin and Duloxetine today.  - gabapentin (NEURONTIN) 300 MG capsule; Take 1 capsule (300 mg total) by mouth 3 (three) times daily.  Dispense: 90 capsule; Refill: 3 - DULoxetine (CYMBALTA) 20 MG capsule; Take 1 capsule (20 mg total) by mouth daily.  Dispense: 30 capsule; Refill: 3  3. Abdominal distension (gaseous) She will keep appointment for Abdominal US on 05/15/2019.  4. Anxiety and depression We will initiate Duloxetine today.  - DULoxetine (CYMBALTA) 20 MG capsule; Take 1 capsule (20 mg total) by mouth daily.  Dispense: 30 capsule; Refill: 3  5. Assistance needed with transportation We are working on finding transportation for her appointment on  05/15/2019.  6. Follow up She will follow up in 1 month.   Meds ordered this encounter  Medications  . gabapentin (NEURONTIN) 300 MG capsule    Sig: Take 1 capsule (300 mg total) by mouth 3 (three) times daily.    Dispense:  90 capsule    Refill:  3  . DISCONTD: DULoxetine (CYMBALTA) 20 MG capsule    Sig: Take 1 capsule (20 mg total) by mouth daily.    Dispense:  30 capsule    Refill:  3  . DULoxetine (CYMBALTA) 20 MG capsule    Sig: Take 1 capsule (20 mg total) by mouth daily.    Dispense:  30 capsule    Refill:  3    No orders of the defined types were placed in this encounter.   Referral Orders  No referral(s) requested today    Kathe Becton,  MSN, FNP-BC Patient [place], [place] [phone]   Problem List Items Addressed This Visit      Other   Abdominal distension (gaseous)   Peripheral edema - Primary    Other Visit Diagnoses    Neuropathic pain  Relevant Medications   gabapentin (NEURONTIN) 300 MG capsule   DULoxetine (CYMBALTA) 20 MG capsule   Anxiety and depression       Relevant Medications   DULoxetine (CYMBALTA) 20 MG capsule   Assistance needed with  transportation       Follow up          Meds ordered this encounter  Medications  . gabapentin (NEURONTIN) 300 MG capsule    Sig: Take 1 capsule (300 mg total) by mouth 3 (three) times daily.    Dispense:  90 capsule    Refill:  3  . DISCONTD: DULoxetine (CYMBALTA) 20 MG capsule    Sig: Take 1 capsule (20 mg total) by mouth daily.    Dispense:  30 capsule    Refill:  3  . DULoxetine (CYMBALTA) 20 MG capsule    Sig: Take 1 capsule (20 mg total) by mouth daily.    Dispense:  30 capsule    Refill:  3    Follow-up: Return in about 1 month (around 06/07/2019).    Azzie Glatter, FNP

## 2019-05-14 ENCOUNTER — Ambulatory Visit: Payer: Medicaid Other | Admitting: Family Medicine

## 2019-05-15 ENCOUNTER — Other Ambulatory Visit: Payer: Self-pay

## 2019-05-15 ENCOUNTER — Ambulatory Visit (HOSPITAL_COMMUNITY)
Admission: RE | Admit: 2019-05-15 | Discharge: 2019-05-15 | Disposition: A | Discharge status: Home / Self Care | Payer: Medicaid Other | Source: Ambulatory Visit | Attending: Family Medicine | Admitting: Family Medicine

## 2019-05-15 DIAGNOSIS — R109 Unspecified abdominal pain: Secondary | ICD-10-CM | POA: Diagnosis present

## 2019-05-15 DIAGNOSIS — G8929 Other chronic pain: Secondary | ICD-10-CM | POA: Diagnosis present

## 2019-05-15 DIAGNOSIS — R140 Abdominal distension (gaseous): Secondary | ICD-10-CM | POA: Diagnosis not present

## 2019-05-15 DIAGNOSIS — Z7689 Persons encountering health services in other specified circumstances: Secondary | ICD-10-CM | POA: Diagnosis not present

## 2019-05-15 DIAGNOSIS — R1907 Generalized intra-abdominal and pelvic swelling, mass and lump: Secondary | ICD-10-CM | POA: Diagnosis present

## 2019-05-16 ENCOUNTER — Telehealth: Payer: Self-pay

## 2019-05-16 NOTE — Telephone Encounter (Signed)
Left a vm for patient to callback to do the screening

## 2019-05-17 ENCOUNTER — Encounter: Payer: Self-pay | Admitting: Family Medicine

## 2019-05-17 ENCOUNTER — Ambulatory Visit (INDEPENDENT_AMBULATORY_CARE_PROVIDER_SITE_OTHER): Payer: Medicaid Other | Admitting: Family Medicine

## 2019-05-17 VITALS — BP 113/62 | HR 82 | Temp 98.1°F | Ht 64.0 in | Wt 178.0 lb

## 2019-05-17 DIAGNOSIS — R140 Abdominal distension (gaseous): Secondary | ICD-10-CM | POA: Diagnosis not present

## 2019-05-17 DIAGNOSIS — F32A Depression, unspecified: Secondary | ICD-10-CM

## 2019-05-17 DIAGNOSIS — M792 Neuralgia and neuritis, unspecified: Secondary | ICD-10-CM

## 2019-05-17 DIAGNOSIS — R1084 Generalized abdominal pain: Secondary | ICD-10-CM

## 2019-05-17 DIAGNOSIS — F329 Major depressive disorder, single episode, unspecified: Secondary | ICD-10-CM | POA: Diagnosis not present

## 2019-05-17 DIAGNOSIS — R1907 Generalized intra-abdominal and pelvic swelling, mass and lump: Secondary | ICD-10-CM

## 2019-05-17 DIAGNOSIS — Z09 Encounter for follow-up examination after completed treatment for conditions other than malignant neoplasm: Secondary | ICD-10-CM | POA: Diagnosis not present

## 2019-05-17 DIAGNOSIS — F419 Anxiety disorder, unspecified: Secondary | ICD-10-CM | POA: Diagnosis not present

## 2019-05-17 DIAGNOSIS — R609 Edema, unspecified: Secondary | ICD-10-CM | POA: Diagnosis not present

## 2019-05-17 DIAGNOSIS — R600 Localized edema: Secondary | ICD-10-CM

## 2019-05-17 MED ORDER — GABAPENTIN 600 MG PO TABS: 600.0000 mg | ORAL_TABLET | Freq: Three times a day (TID) | ORAL | 3 refills | Status: DC

## 2019-05-17 NOTE — Progress Notes (Signed)
Patient [facility]    Established Patient Office Visit  Subjective:  Patient ID: Brianna Velez, female    DOB: February 06, 1976  Age: 43 y.o. MRN: [id number]  CC:  Chief Complaint  Patient presents with  . Follow-up    edema    HPI Brianna Velez is a 43 year old female who presents for Follow Up today.   Past Medical History:  Diagnosis Date  . Anxiety and depression   . Fibroids   . History of hiatal hernia   . Leg pain   . Migraine   . Sciatica    Current Status: Since her last office visit, she is doing well with no complaints. She denies fevers, chills, fatigue, recent infections, weight loss, and night sweats. She has not had any headaches, visual changes, dizziness, and falls. No chest pain, heart palpitations, cough and shortness of breath reported. She continues have abdominal bloating and flatulence. No reports of GI problems such as nausea, vomiting, diarrhea, and constipation. She has no reports of blood in stools, dysuria and hematuria. No depression or anxiety reported. She denies suicidal ideations, homicidal ideations, or auditory hallucinations. She denies pain today.   Past Surgical History:  Procedure Laterality Date  . ABDOMINAL HYSTERECTOMY  09/29/2015   Procedure: HYSTERECTOMY ABDOMINAL;  Surgeon: Mora Bellman, MD;  Location: [facility];  Service: Gynecology;;  . BILATERAL SALPINGECTOMY Bilateral 09/29/2015   Procedure: BILATERAL SALPINGECTOMY;  Surgeon: Mora Bellman, MD;  Location: [facility];  Service: Gynecology;  Laterality: Bilateral;  . TUBAL LIGATION    . WISDOM TOOTH EXTRACTION      Family History  Problem Relation Age of Onset  . Diabetes Sister   . Heart disease Paternal Grandmother   . Hypertension Father   . Dementia Mother   . Hypertension Mother     Social History   Socioeconomic History  . Marital status: Single    Spouse name: Not on file  . Number of children: Not on file  . Years of education:  Not on file  . Highest education level: Not on file  Occupational History  . Not on file  Social Needs  . Financial resource strain: Not on file  . Food insecurity    Worry: Not on file    Inability: Not on file  . Transportation needs    Medical: Not on file    Non-medical: Not on file  Tobacco Use  . Smoking status: Former Smoker    Quit date: 12/05/1996    Years since quitting: 22.4  . Smokeless tobacco: Never Used  Substance and Sexual Activity  . Alcohol use: No  . Drug use: No  . Sexual activity: Not on file  Lifestyle  . Physical activity    Days per week: Not on file    Minutes per session: Not on file  . Stress: Not on file  Relationships  . Social Herbalist on phone: Not on file    Gets together: Not on file    Attends religious service: Not on file    Active member of club or organization: Not on file    Attends meetings of clubs or organizations: Not on file    Relationship status: Not on file  . Intimate partner violence    Fear of current or ex partner: Not on file    Emotionally abused: Not on file    Physically abused: Not on file    Forced sexual activity: Not  on file  Other Topics Concern  . Not on file  Social History Narrative  . Not on file    Outpatient Medications Prior to Visit  Medication Sig Dispense Refill  . busPIRone (BUSPAR) 10 MG tablet Take 1 tablet (10 mg total) by mouth 3 (three) times daily. 60 tablet 6  . butalbital-acetaminophen-caffeine (FIORICET) 50-325-40 MG tablet Take 1-2 tablets by mouth every 6 (six) hours as needed for headache. 20 tablet 0  . cyclobenzaprine (FLEXERIL) 10 MG tablet Take 1 tablet (10 mg total) by mouth 3 (three) times daily as needed for muscle spasms. 30 tablet 3  . DULoxetine (CYMBALTA) 20 MG capsule Take 1 capsule (20 mg total) by mouth daily. 30 capsule 3  . furosemide (LASIX) 40 MG tablet Take 1 tablet (40 mg total) by mouth 2 (two) times daily. 60 tablet 3  . meloxicam (MOBIC) 15 MG tablet  Take 1 tablet (15 mg total) by mouth daily. 30 tablet 2  . pantoprazole (PROTONIX) 20 MG tablet Take 1 tablet (20 mg total) by mouth daily. 30 tablet 1  . gabapentin (NEURONTIN) 300 MG capsule Take 1 capsule (300 mg total) by mouth 3 (three) times daily. 90 capsule 3  . Vitamin D, Ergocalciferol, (DRISDOL) 1.25 MG (50000 UT) CAPS capsule Take 1 capsule (50,000 Units total) by mouth every 7 (seven) days. (Patient not taking: Reported on 05/17/2019) 5 capsule 3   No facility-administered medications prior to visit.     Allergies  Allergen Reactions  . Prednisone     Insomnia "it has me up all night"  . Hydrocodone Rash    ROS Review of Systems  Constitutional: Negative.   HENT: Negative.   Eyes: Negative.   Respiratory: Negative.   Cardiovascular: Negative.   Gastrointestinal: Positive for abdominal distention (flatulence).  Endocrine: Negative.   Genitourinary: Negative.   Musculoskeletal: Negative.   Skin: Negative.   Allergic/Immunologic: Negative.   Neurological: Negative.   Hematological: Negative.   Psychiatric/Behavioral: Negative.       Objective:    Physical Exam  Constitutional: She is oriented to person, place, and time. She appears well-developed and well-nourished.  HENT:  Head: Normocephalic and atraumatic.  Eyes: Conjunctivae are normal.  Neck: Normal range of motion. Neck supple.  Cardiovascular: Normal rate, regular rhythm, normal heart sounds and intact distal pulses.  Pulmonary/Chest: Breath sounds normal.  Abdominal: Soft. Distention: swollen.  Musculoskeletal: Normal range of motion.  Neurological: She is alert and oriented to person, place, and time. She has normal reflexes.  Skin: Skin is warm.  Psychiatric: She has a normal mood and affect. Her behavior is normal. Judgment and thought content normal.  Vitals reviewed.   BP 113/62 (BP Location: Right Arm, Patient Position: Sitting, Cuff Size: Small)   Pulse 82   Temp 98.1 F (36.7 C) (Oral)    Ht 5\' 4"  (1.626 m)   Wt 178 lb (80.7 kg)   LMP 08/14/2016   SpO2 100%   BMI 30.55 kg/m  Wt Readings from Last 3 Encounters:  05/17/19 178 lb (80.7 kg)  05/08/19 178 lb (80.7 kg)  04/17/19 178 lb (80.7 kg)     Health Maintenance Due  Topic Date Due  . PAP SMEAR-Modifier  04/30/2017    There are no preventive care reminders to display for this patient.  Lab Results  Component Value Date   TSH 2.310 02/22/2019   Lab Results  Component Value Date   WBC 3.9 02/22/2019   HGB 14.0 02/22/2019   HCT  41.8 02/22/2019   MCV 92 02/22/2019   PLT 173 02/22/2019   Lab Results  Component Value Date   NA 142 04/01/2019   K 4.0 04/01/2019   CO2 31 04/01/2019   GLUCOSE 107 (H) 04/01/2019   BUN 13 04/01/2019   CREATININE 0.86 04/01/2019   BILITOT <0.2 02/22/2019   ALKPHOS 70 02/22/2019   AST 25 02/22/2019   ALT 28 02/22/2019   PROT 7.4 02/22/2019   ALBUMIN 4.5 02/22/2019   CALCIUM 9.6 04/01/2019   ANIONGAP 11 04/01/2019   Lab Results  Component Value Date   CHOL 217 (H) 02/22/2019   Lab Results  Component Value Date   HDL 42 02/22/2019   Lab Results  Component Value Date   LDLCALC 138 (H) 02/22/2019   Lab Results  Component Value Date   TRIG 184 (H) 02/22/2019   Lab Results  Component Value Date   CHOLHDL 5.2 (H) 02/22/2019   No results found for: HGBA1C    Assessment & Plan:   1. Generalized abdominal pain Ultrasound reveal abnormality. We will order CT scan to further evaluate.  - CT Abdomen Pelvis W Contrast; Future  2. Abdominal swelling, generalized  3. Flatulence/gas pain/belching  4. Neuropathic pain We will increase dose of Gabapentin to 600 mg TID today.  - gabapentin (NEURONTIN) 600 MG tablet; Take 1 tablet (600 mg total) by mouth 3 (three) times daily.  Dispense: 90 tablet; Refill: 3  5. Abdominal distension (gaseous)  6. Peripheral edema  7. Anxiety and depression  8. Follow up She keep scheduled follow up appointment.   Meds  ordered this encounter  Medications  . gabapentin (NEURONTIN) 600 MG tablet    Sig: Take 1 tablet (600 mg total) by mouth 3 (three) times daily.    Dispense:  90 tablet    Refill:  3    Orders Placed This Encounter  Procedures  . CT Abdomen Pelvis W Contrast    Referral Orders  No referral(s) requested today    Kathe Becton,  MSN, FNP-BC Patient [place], [place] [phone]  Problem List Items Addressed This Visit      Other   Abdominal distension (gaseous)   Anxiety and depression   Flatulence/gas pain/belching   Neuropathic pain   Relevant Medications   gabapentin (NEURONTIN) 600 MG tablet   Peripheral edema    Other Visit Diagnoses    Generalized abdominal pain    -  Primary   Relevant Orders   CT Abdomen Pelvis W Contrast   Abdominal swelling, generalized       Follow up          Meds ordered this encounter  Medications  . gabapentin (NEURONTIN) 600 MG tablet    Sig: Take 1 tablet (600 mg total) by mouth 3 (three) times daily.    Dispense:  90 tablet    Refill:  3    Follow-up: No follow-ups on file.    Azzie Glatter, FNP

## 2019-05-20 ENCOUNTER — Ambulatory Visit: Payer: Medicaid Other | Admitting: Family Medicine

## 2019-05-21 DIAGNOSIS — R1084 Generalized abdominal pain: Secondary | ICD-10-CM | POA: Insufficient documentation

## 2019-05-27 ENCOUNTER — Other Ambulatory Visit: Payer: Self-pay

## 2019-05-27 ENCOUNTER — Ambulatory Visit (HOSPITAL_COMMUNITY)
Admission: RE | Admit: 2019-05-27 | Discharge: 2019-05-27 | Disposition: A | Discharge status: Home / Self Care | Payer: Medicaid Other | Source: Ambulatory Visit | Attending: Family Medicine | Admitting: Family Medicine

## 2019-05-27 ENCOUNTER — Encounter (HOSPITAL_COMMUNITY): Payer: Self-pay

## 2019-05-27 DIAGNOSIS — K449 Diaphragmatic hernia without obstruction or gangrene: Secondary | ICD-10-CM | POA: Diagnosis not present

## 2019-05-27 DIAGNOSIS — R1084 Generalized abdominal pain: Secondary | ICD-10-CM | POA: Diagnosis not present

## 2019-05-27 DIAGNOSIS — K760 Fatty (change of) liver, not elsewhere classified: Secondary | ICD-10-CM | POA: Diagnosis not present

## 2019-05-27 DIAGNOSIS — N83202 Unspecified ovarian cyst, left side: Secondary | ICD-10-CM | POA: Diagnosis not present

## 2019-05-27 DIAGNOSIS — Z7689 Persons encountering health services in other specified circumstances: Secondary | ICD-10-CM | POA: Diagnosis not present

## 2019-05-27 MED ORDER — IOHEXOL 300 MG/ML  SOLN
100.0000 mL | Freq: Once | INTRAMUSCULAR | Status: AC | PRN
  Administered 2019-05-27: 100 mL via INTRAVENOUS

## 2019-05-27 MED ORDER — SODIUM CHLORIDE (PF) 0.9 % IJ SOLN
INTRAMUSCULAR | Status: AC
  Filled 2019-05-27: qty 50

## 2019-05-29 ENCOUNTER — Telehealth: Payer: Self-pay

## 2019-05-29 NOTE — Telephone Encounter (Signed)
Patient calling about CT results. Patient is aware that you are out of office until Friday.

## 2019-06-03 ENCOUNTER — Other Ambulatory Visit: Payer: Self-pay | Admitting: Family Medicine

## 2019-06-03 DIAGNOSIS — G8929 Other chronic pain: Secondary | ICD-10-CM

## 2019-06-03 DIAGNOSIS — R109 Unspecified abdominal pain: Secondary | ICD-10-CM

## 2019-06-04 ENCOUNTER — Telehealth: Payer: Self-pay

## 2019-06-04 NOTE — Telephone Encounter (Signed)
Brianna Velez, Patient called and is having vaginal bleeding but has had a hysterectomy. Please contact patient if she needs an appointment or if she can be referred. Thanks!

## 2019-06-05 ENCOUNTER — Other Ambulatory Visit: Payer: Self-pay

## 2019-06-05 ENCOUNTER — Other Ambulatory Visit: Payer: Self-pay | Admitting: Family Medicine

## 2019-06-05 ENCOUNTER — Ambulatory Visit (INDEPENDENT_AMBULATORY_CARE_PROVIDER_SITE_OTHER): Payer: Medicaid Other | Admitting: Family Medicine

## 2019-06-05 DIAGNOSIS — R1084 Generalized abdominal pain: Secondary | ICD-10-CM | POA: Diagnosis not present

## 2019-06-05 DIAGNOSIS — J181 Lobar pneumonia, unspecified organism: Secondary | ICD-10-CM | POA: Diagnosis not present

## 2019-06-05 DIAGNOSIS — J189 Pneumonia, unspecified organism: Secondary | ICD-10-CM

## 2019-06-05 DIAGNOSIS — N950 Postmenopausal bleeding: Secondary | ICD-10-CM | POA: Insufficient documentation

## 2019-06-05 DIAGNOSIS — F419 Anxiety disorder, unspecified: Secondary | ICD-10-CM

## 2019-06-05 DIAGNOSIS — N939 Abnormal uterine and vaginal bleeding, unspecified: Secondary | ICD-10-CM

## 2019-06-05 DIAGNOSIS — F32A Depression, unspecified: Secondary | ICD-10-CM

## 2019-06-05 DIAGNOSIS — Z09 Encounter for follow-up examination after completed treatment for conditions other than malignant neoplasm: Secondary | ICD-10-CM

## 2019-06-05 DIAGNOSIS — R05 Cough: Secondary | ICD-10-CM

## 2019-06-05 DIAGNOSIS — R059 Cough, unspecified: Secondary | ICD-10-CM | POA: Insufficient documentation

## 2019-06-05 DIAGNOSIS — G8929 Other chronic pain: Secondary | ICD-10-CM

## 2019-06-05 DIAGNOSIS — F329 Major depressive disorder, single episode, unspecified: Secondary | ICD-10-CM

## 2019-06-05 MED ORDER — DOXYCYCLINE HYCLATE 100 MG PO TABS: 100.0000 mg | ORAL_TABLET | Freq: Two times a day (BID) | ORAL | 0 refills | Status: DC

## 2019-06-05 MED ORDER — AMOXICILLIN-POT CLAVULANATE 875-125 MG PO TABS: 1.0000 | ORAL_TABLET | Freq: Two times a day (BID) | ORAL | 0 refills | Status: AC

## 2019-06-05 MED ORDER — MELOXICAM 15 MG PO TABS: 15.0000 mg | ORAL_TABLET | Freq: Every day | ORAL | 2 refills | Status: DC

## 2019-06-05 NOTE — Progress Notes (Signed)
Virtual Visit via Telephone Note  I connected with Brianna Velez on 06/05/19 at  3:20 PM EDT by telephone and verified that I am speaking with the correct person using two identifiers.   I discussed the limitations, risks, security and privacy concerns of performing an evaluation and management service by telephone and the availability of in person appointments. I also discussed with the patient that there may be a patient responsible charge related to this service. The patient expressed understanding and agreed to proceed.   History of Present Illness:  Past Medical History:  Diagnosis Date  . Anxiety and depression   . Fibroids   . History of hiatal hernia   . Leg pain   . Migraine   . Sciatica     Current Outpatient Medications on File Prior to Visit  Medication Sig Dispense Refill  . busPIRone (BUSPAR) 10 MG tablet Take 1 tablet (10 mg total) by mouth 3 (three) times daily. 60 tablet 6  . butalbital-acetaminophen-caffeine (FIORICET) 50-325-40 MG tablet Take 1-2 tablets by mouth every 6 (six) hours as needed for headache. 20 tablet 0  . cyclobenzaprine (FLEXERIL) 10 MG tablet Take 1 tablet (10 mg total) by mouth 3 (three) times daily as needed for muscle spasms. 30 tablet 3  . DULoxetine (CYMBALTA) 20 MG capsule Take 1 capsule (20 mg total) by mouth daily. 30 capsule 3  . furosemide (LASIX) 40 MG tablet Take 1 tablet (40 mg total) by mouth 2 (two) times daily. 60 tablet 3  . gabapentin (NEURONTIN) 600 MG tablet Take 1 tablet (600 mg total) by mouth 3 (three) times daily. 90 tablet 3  . pantoprazole (PROTONIX) 20 MG tablet Take 1 tablet (20 mg total) by mouth daily. 30 tablet 1  . Vitamin D, Ergocalciferol, (DRISDOL) 1.25 MG (50000 UT) CAPS capsule Take 1 capsule (50,000 Units total) by mouth every 7 (seven) days. (Patient not taking: Reported on 05/17/2019) 5 capsule 3   No current facility-administered medications on file prior to visit.     Current Status: Since her last office  visit, she has c/o vaginal bleeding X 1 day. She states that vaginal bleeding was very heavy, 'like my menstrual period was on.' She has a previous history of hysterectomy. She admits to having another past incident of vaginal bleeding 1 year ago. She is very concerned. She continues to have moderate generalized abdominal pain today. She denies any reports of GI problems such as nausea, vomiting, diarrhea, and constipation. She has no reports of blood in stools, dysuria and hematuria. Last CT scan results from 05/27/2019 were reviewed with her today. Scan revealed possible pneumonia located in lower left lobe. Patient admits to moderate coughing, congestion, and a mild 'discomfort' in her lower left chest area. She had not taken any medication for relief of symptoms. Denies chest pain, heart palpitations, cough and shortness of breath reported.She denies fevers, chills, fatigue, recent infections, weight loss, and night sweats. She has not had any headaches, visual changes, dizziness, and falls.   No depression or anxiety, and denies suicidal ideations, homicidal ideations, or auditory hallucinations.    Observations/Objective:  Telephone Virtual Visit   Assessment and Plan:  1. Postmenopausal vaginal bleeding Referral sent to GYN today.   2. Generalized abdominal pain Continue Mobic as prescribed.   3. Anxiety and depression Moderate today, r/t anxiety.   4. Pneumonia of left lower lobe due to infectious organism [facility]) We will initiate Amoxicillin an Doxycycline today.   5. Cough  No orders of  the defined types were placed in this encounter.   No orders of the defined types were placed in this encounter.  Referral Orders  No referral(s) requested today   Kathe Becton,  MSN, FNP-BC Patient [place], [place] [phone]    Follow Up Instructions:  She will keep follow up appointment for 08/2019. She will  contact office if symptoms do not improve or worsen.     I discussed the assessment and treatment plan with the patient. The patient was provided an opportunity to ask questions and all were answered. The patient agreed with the plan and demonstrated an understanding of the instructions.   The patient was advised to call back or seek an in-person evaluation if the symptoms worsen or if the condition fails to improve as anticipated.  I provided 15 minutes of non-face-to-face time during this encounter.   Azzie Glatter, FNP

## 2019-06-11 ENCOUNTER — Ambulatory Visit: Payer: Medicaid Other | Admitting: Family Medicine

## 2019-06-24 ENCOUNTER — Encounter: Payer: Self-pay | Admitting: Obstetrics & Gynecology

## 2019-06-24 ENCOUNTER — Other Ambulatory Visit: Payer: Self-pay

## 2019-06-24 ENCOUNTER — Telehealth (INDEPENDENT_AMBULATORY_CARE_PROVIDER_SITE_OTHER): Payer: Medicaid Other | Admitting: Obstetrics & Gynecology

## 2019-06-24 DIAGNOSIS — N939 Abnormal uterine and vaginal bleeding, unspecified: Secondary | ICD-10-CM | POA: Diagnosis not present

## 2019-06-24 NOTE — Progress Notes (Signed)
TELEHEALTH VIRTUAL GYNECOLOGY VISIT ENCOUNTER NOTE  I connected with Brianna Velez on 06/24/19 at  3:15 PM EDT by telephone at home and verified that I am speaking with the correct person using two identifiers.   I discussed the limitations, risks, security and privacy concerns of performing an evaluation and management service by telephone and the availability of in person appointments. I also discussed with the patient that there may be a patient responsible charge related to this service. The patient expressed understanding and agreed to proceed.   History:  Brianna Velez is a 43 y.o. [id number] female being evaluated today for vaginal bleeding. Pt is /sp hyst in 2016 by Dr. Elly Modena.  She reports that she has had pneumonia and now has a cyst on her ovary. She denies pelvic pain.  She reports intermittent bleeding that she believes is coming from her vagina.    Past Medical History:  Diagnosis Date   Anxiety and depression    Fibroids    History of hiatal hernia    Leg pain    Migraine    Sciatica    Past Surgical History:  Procedure Laterality Date   ABDOMINAL HYSTERECTOMY  09/29/2015   Procedure: HYSTERECTOMY ABDOMINAL;  Surgeon: Mora Bellman, MD;  Location: [facility];  Service: Gynecology;;   BILATERAL SALPINGECTOMY Bilateral 09/29/2015   Procedure: BILATERAL SALPINGECTOMY;  Surgeon: Mora Bellman, MD;  Location: [facility];  Service: Gynecology;  Laterality: Bilateral;   TUBAL LIGATION     WISDOM TOOTH EXTRACTION     The following portions of the patient's history were reviewed and updated as appropriate: allergies, current medications, past family history, past medical history, past social history, past surgical history and problem list.    Review of Systems:  Pertinent items noted in HPI and remainder of comprehensive ROS otherwise negative.  Physical Exam:   General:  Alert, oriented and cooperative.   Mental Status: Normal mood and affect perceived. Normal  judgment and thought content.  Physical exam deferred due to nature of the encounter  Labs and Imaging No results found for this or any previous visit (from the past 336 hour(s)). Ct Abdomen Pelvis W Contrast  Result Date: 05/27/2019 CLINICAL DATA:  Abdominal pain and distension EXAM: CT ABDOMEN AND PELVIS WITH CONTRAST TECHNIQUE: Multidetector CT imaging of the abdomen and pelvis was performed using the standard protocol following bolus administration of intravenous contrast. CONTRAST:  16mL OMNIPAQUE IOHEXOL 300 MG/ML  SOLN COMPARISON:  June 17, 2017 FINDINGS: Lower chest: There is posterior bibasilar atelectasis, more on the left than on the right. A small focus of superimposed pneumonia in the posterior left base is questioned. There is a small hiatal hernia. Hepatobiliary: There is a 5 mm cyst near the dome of the liver in the left lobe region. There is a degree of hepatic steatosis. No other focal liver lesions evident. The gallbladder wall is not appreciably thickened. There is no biliary duct dilatation. Pancreas: No pancreatic mass or inflammatory focus evident. Spleen: No splenic lesions are appreciable. Adrenals/Urinary Tract: Adrenals bilaterally appear unremarkable. Kidneys bilaterally show no evident mass or hydronephrosis on either side. There is an extrarenal pelvis on each side, an anatomic variant. There is no appreciable renal or ureteral calculus on either side. Urinary bladder wall is thickened. Stomach/Bowel: There is moderate stool in the colon. There is no appreciable bowel wall or mesenteric thickening. Terminal ileum appears unremarkable. No evident bowel obstruction. There is no free air or portal venous air. Vascular/Lymphatic: There is no demonstrable  abdominal aortic aneurysm. No vascular lesions are appreciable. There is no adenopathy in the abdomen or pelvis. Reproductive: The uterus is absent. There is a 3.5 x 2.6 cm left ovarian cystic mass, likely a simple cyst. No other  pelvic masses evident. Other: The appendix appears normal. There is no evident abscess or ascites in the abdomen or pelvis. There is a small ventral hernia containing only fat. There is slight scarring at the level of the midline rectus muscle at a site of thinning slightly superior to this umbilical region ventral hernia. Musculoskeletal: There are no blastic or lytic bone lesions. No intramuscular lesions are evident. IMPRESSION: 1. Bibasilar atelectasis with questionable small area of superimposed pneumonia in the posterior segment of the left lower lobe. 2. Urinary bladder wall thickening, an appearance indicative of a degree of cystitis. No evident renal or ureteral calculus. No hydronephrosis. 3. No bowel obstruction. No abscess in the abdomen or pelvis. Appendix appears normal. 4. Left ovarian cystic mass measuring 3.5 x 2.6 cm, most likely a simple cyst. 5.  Hepatic steatosis. 6. Small hiatal hernia. Fairly small ventral hernia containing fat but no bowel. 7.  Uterus absent. Electronically Signed   By: Lowella Grip III M.D.   On: 05/27/2019 09:22      Assessment and Plan:     Vaginal bleeding post hysterectomy  Pt needs to come in for a physical exam        Pt was scheduled for a Web based visit. She was unable to connect via web and this visit was conducted via telephone. Pt is known to me from prior visits.   I discussed the assessment and treatment plan with the patient. The patient was provided an opportunity to ask questions and all were answered. The patient agreed with the plan and demonstrated an understanding of the instructions.   The patient was advised to call back or seek an in-person evaluation/go to the ED if the symptoms worsen or if the condition fails to improve as anticipated.  I provided 10 minutes of non-face-to-face time during this encounter.   Lavonia Drafts, MD [facility], [place]

## 2019-06-24 NOTE — Progress Notes (Signed)
I connected with  Brianna Velez on 06/24/19 at  3:15 PM EDT by telephone and verified that I am speaking with the correct person using two identifiers.   I discussed the limitations, risks, security and privacy concerns of performing an evaluation and management service by telephone and the availability of in person appointments. I also discussed with the patient that there may be a patient responsible charge related to this service. The patient expressed understanding and agreed to proceed.  [place], CMA 06/24/2019  3:15 PM

## 2019-06-25 ENCOUNTER — Other Ambulatory Visit: Payer: Self-pay | Admitting: Family Medicine

## 2019-06-25 DIAGNOSIS — M62838 Other muscle spasm: Secondary | ICD-10-CM

## 2019-07-17 ENCOUNTER — Other Ambulatory Visit: Payer: Self-pay

## 2019-07-17 ENCOUNTER — Ambulatory Visit (INDEPENDENT_AMBULATORY_CARE_PROVIDER_SITE_OTHER): Payer: Medicaid Other | Admitting: Obstetrics and Gynecology

## 2019-07-17 ENCOUNTER — Encounter: Payer: Self-pay | Admitting: Obstetrics and Gynecology

## 2019-07-17 VITALS — BP 131/82 | HR 91 | Wt 176.9 lb

## 2019-07-17 DIAGNOSIS — Z1239 Encounter for other screening for malignant neoplasm of breast: Secondary | ICD-10-CM

## 2019-07-17 DIAGNOSIS — Z7689 Persons encountering health services in other specified circumstances: Secondary | ICD-10-CM | POA: Diagnosis not present

## 2019-07-17 DIAGNOSIS — N83202 Unspecified ovarian cyst, left side: Secondary | ICD-10-CM | POA: Diagnosis not present

## 2019-07-17 NOTE — Progress Notes (Signed)
43 yo P2 s/p hysterectomy in 2016 presenting today for the evaluation of vaginal bleeding. Patient reports onset of vaginal bleeding 2 years ago. She states that the first episode of vaginal bleeding took place at work and lasted one day. She states that it was never heavy enough to wear a pad. It consisted of vaginal spotting that she noted when she wiped after using the bathroom. This has been occurring once a year since consisting of spotting noted when wiping. Patient is being evaluated by GI for abdominal distention noted a few years ago. Patient is not sexually active. Patient denies any pelvic pain. Patient denies dysuria  Past Medical History:  Diagnosis Date  . Anxiety and depression   . Fibroids   . History of hiatal hernia   . Leg pain   . Migraine   . Sciatica    Past Surgical History:  Procedure Laterality Date  . ABDOMINAL HYSTERECTOMY  09/29/2015   Procedure: HYSTERECTOMY ABDOMINAL;  Surgeon: Mora Bellman, MD;  Location: [facility];  Service: Gynecology;;  . BILATERAL SALPINGECTOMY Bilateral 09/29/2015   Procedure: BILATERAL SALPINGECTOMY;  Surgeon: Mora Bellman, MD;  Location: [facility];  Service: Gynecology;  Laterality: Bilateral;  . TUBAL LIGATION    . WISDOM TOOTH EXTRACTION     Family History  Problem Relation Age of Onset  . Diabetes Sister   . Heart disease Paternal Grandmother   . Hypertension Father   . Dementia Mother   . Hypertension Mother    Social History   Tobacco Use  . Smoking status: Former Smoker    Quit date: 12/05/1996    Years since quitting: 22.6  . Smokeless tobacco: Never Used  Substance Use Topics  . Alcohol use: No  . Drug use: No   ROS See pertinent in HPI  Blood pressure 131/82, pulse 91, weight 176 lb 14.4 oz (80.2 kg), last menstrual period 08/14/2016.  GENERAL: Well-developed, well-nourished female in no acute distress.  ABDOMEN: Soft, nontender,softly distended, + bowel sounds. No organomegaly. PELVIC: Normal external female  genitalia. Vagina is pink and rugated.  Normal discharge. Vaginal vault intact. No adnexal mass or tenderness. No hemorrhoids visualized EXTREMITIES: No cyanosis, clubbing, or edema, 2+ distal pulses.  A/P 43 yo s/p hysterectomy with perineal bleeding - Pelvic ultrasound ordered to assess resolution of left ovarian cyst seen on CT scan - Screening mammogram ordered - Bleeding does not appear to be of vaginal origin. Advised patient to be evaluated in ED if bleeding recurs

## 2019-07-17 NOTE — Progress Notes (Signed)
[place] scheduled for August 20th @ 1500.  Mammogram scheduled for September 23rd @ 1700.  Pt notified.

## 2019-07-21 DIAGNOSIS — Z23 Encounter for immunization: Secondary | ICD-10-CM | POA: Diagnosis not present

## 2019-07-25 ENCOUNTER — Ambulatory Visit (HOSPITAL_COMMUNITY)
Admission: RE | Admit: 2019-07-25 | Discharge: 2019-07-25 | Disposition: A | Discharge status: Home / Self Care | Payer: Medicaid Other | Source: Ambulatory Visit | Attending: Obstetrics and Gynecology | Admitting: Obstetrics and Gynecology

## 2019-07-25 ENCOUNTER — Other Ambulatory Visit: Payer: Self-pay

## 2019-07-25 DIAGNOSIS — N83202 Unspecified ovarian cyst, left side: Secondary | ICD-10-CM | POA: Insufficient documentation

## 2019-08-04 ENCOUNTER — Other Ambulatory Visit: Payer: Self-pay | Admitting: Family Medicine

## 2019-08-04 DIAGNOSIS — J189 Pneumonia, unspecified organism: Secondary | ICD-10-CM

## 2019-08-10 ENCOUNTER — Other Ambulatory Visit: Payer: Self-pay | Admitting: Family Medicine

## 2019-08-14 ENCOUNTER — Ambulatory Visit (INDEPENDENT_AMBULATORY_CARE_PROVIDER_SITE_OTHER): Payer: Medicaid Other | Admitting: Family Medicine

## 2019-08-14 ENCOUNTER — Encounter: Payer: Self-pay | Admitting: Family Medicine

## 2019-08-14 ENCOUNTER — Other Ambulatory Visit: Payer: Self-pay

## 2019-08-14 VITALS — BP 122/75 | HR 107 | Temp 98.3°F | Ht 64.0 in | Wt 178.6 lb

## 2019-08-14 DIAGNOSIS — R232 Flushing: Secondary | ICD-10-CM

## 2019-08-14 DIAGNOSIS — M792 Neuralgia and neuritis, unspecified: Secondary | ICD-10-CM | POA: Diagnosis not present

## 2019-08-14 DIAGNOSIS — Z0000 Encounter for general adult medical examination without abnormal findings: Secondary | ICD-10-CM | POA: Diagnosis not present

## 2019-08-14 DIAGNOSIS — R140 Abdominal distension (gaseous): Secondary | ICD-10-CM | POA: Diagnosis not present

## 2019-08-14 DIAGNOSIS — Z09 Encounter for follow-up examination after completed treatment for conditions other than malignant neoplasm: Secondary | ICD-10-CM | POA: Diagnosis not present

## 2019-08-14 DIAGNOSIS — M545 Low back pain, unspecified: Secondary | ICD-10-CM

## 2019-08-14 DIAGNOSIS — Z1239 Encounter for other screening for malignant neoplasm of breast: Secondary | ICD-10-CM

## 2019-08-14 DIAGNOSIS — R109 Unspecified abdominal pain: Secondary | ICD-10-CM | POA: Diagnosis not present

## 2019-08-14 DIAGNOSIS — G8929 Other chronic pain: Secondary | ICD-10-CM | POA: Diagnosis not present

## 2019-08-14 DIAGNOSIS — F419 Anxiety disorder, unspecified: Secondary | ICD-10-CM | POA: Diagnosis not present

## 2019-08-14 DIAGNOSIS — F329 Major depressive disorder, single episode, unspecified: Secondary | ICD-10-CM

## 2019-08-14 DIAGNOSIS — E559 Vitamin D deficiency, unspecified: Secondary | ICD-10-CM | POA: Insufficient documentation

## 2019-08-14 DIAGNOSIS — F32A Depression, unspecified: Secondary | ICD-10-CM

## 2019-08-14 LAB — POCT URINALYSIS DIPSTICK
Blood, UA: NEGATIVE
Glucose, UA: NEGATIVE
Ketones, UA: NEGATIVE
Leukocytes, UA: NEGATIVE
Nitrite, UA: NEGATIVE
Protein, UA: POSITIVE — AB
Spec Grav, UA: 1.02 (ref 1.010–1.025)
Urobilinogen, UA: 0.2 E.U./dL
pH, UA: 7.5 (ref 5.0–8.0)

## 2019-08-14 LAB — POCT GLYCOSYLATED HEMOGLOBIN (HGB A1C)
HbA1c POC (<> result, manual entry): 5.8 % (ref 4.0–5.6)
HbA1c, POC (controlled diabetic range): 5.8 % (ref 0.0–7.0)
HbA1c, POC (prediabetic range): 5.8 % (ref 5.7–6.4)
Hemoglobin A1C: 5.8 % — AB (ref 4.0–5.6)

## 2019-08-14 MED ORDER — VITAMIN D (ERGOCALCIFEROL) 1.25 MG (50000 UNIT) PO CAPS: 50000.0000 [IU] | ORAL_CAPSULE | ORAL | 3 refills | Status: DC

## 2019-08-14 MED ORDER — GABAPENTIN 600 MG PO TABS: 600.0000 mg | ORAL_TABLET | Freq: Three times a day (TID) | ORAL | 2 refills | Status: DC

## 2019-08-14 MED ORDER — PANTOPRAZOLE SODIUM 20 MG PO TBEC: 20.0000 mg | DELAYED_RELEASE_TABLET | Freq: Every day | ORAL | 2 refills | Status: DC

## 2019-08-14 MED ORDER — DULOXETINE HCL 20 MG PO CPEP: 20.0000 mg | ORAL_CAPSULE | Freq: Every day | ORAL | 2 refills | Status: DC

## 2019-08-14 MED ORDER — MELOXICAM 15 MG PO TABS: 15.0000 mg | ORAL_TABLET | Freq: Every day | ORAL | 2 refills | Status: DC

## 2019-08-14 NOTE — Progress Notes (Signed)
Patient [facility]   Established Patient Office Visit  Subjective:  Patient ID: Brianna Velez, female    DOB: [birth year]  Age: 43 y.o. MRN: [id number]  CC:  Chief Complaint  Patient presents with  . Follow-up    6 month follow up , discuss disabitly and     HPI Brianna Velez is a 43 year old female who presents for Follow Up today.   Past Medical History:  Diagnosis Date  . Anxiety and depression   . Fibroids   . History of hiatal hernia   . Leg pain   . Migraine   . Sciatica    Current Status: Since her last office visit, she has c/o chronic abdominal bloating and discomfort, otherwise, she is doing well with no complaints. She previously had office visit on 07/17/2019 for vaginal bleeding. Was evaluated and discharged. She is requesting letter for disability today. She has c/o hot flashes today.   She denies fevers, chills, fatigue, recent infections, weight loss, and night sweats. She has not had any headaches, visual changes, dizziness, and falls. No chest pain, heart palpitations, cough and shortness of breath reported. No reports of GI problems such as nausea, vomiting, diarrhea, and constipation. She has no reports of blood in stools, dysuria and hematuria. No depression or anxiety reported today. She reports increasing chronic left back pain.   Past Surgical History:  Procedure Laterality Date  . ABDOMINAL HYSTERECTOMY  09/29/2015   Procedure: HYSTERECTOMY ABDOMINAL;  Surgeon: Mora Bellman, MD;  Location: [facility];  Service: Gynecology;;  . BILATERAL SALPINGECTOMY Bilateral 09/29/2015   Procedure: BILATERAL SALPINGECTOMY;  Surgeon: Mora Bellman, MD;  Location: [facility];  Service: Gynecology;  Laterality: Bilateral;  . TUBAL LIGATION    . WISDOM TOOTH EXTRACTION      Family History  Problem Relation Age of Onset  . Diabetes Sister   . Heart disease Paternal Grandmother   . Hypertension Father   . Dementia Mother   .  Hypertension Mother     Social History   Socioeconomic History  . Marital status: Single    Spouse name: Not on file  . Number of children: Not on file  . Years of education: Not on file  . Highest education level: Not on file  Occupational History  . Not on file  Social Needs  . Financial resource strain: Not on file  . Food insecurity    Worry: Not on file    Inability: Not on file  . Transportation needs    Medical: Not on file    Non-medical: Not on file  Tobacco Use  . Smoking status: Former Smoker    Quit date: 12/05/1996    Years since quitting: 22.7  . Smokeless tobacco: Never Used  Substance and Sexual Activity  . Alcohol use: No  . Drug use: No  . Sexual activity: Not on file  Lifestyle  . Physical activity    Days per week: Not on file    Minutes per session: Not on file  . Stress: Not on file  Relationships  . Social Herbalist on phone: Not on file    Gets together: Not on file    Attends religious service: Not on file    Active member of club or organization: Not on file    Attends meetings of clubs or organizations: Not on file    Relationship status: Not on file  . Intimate partner violence  Fear of current or ex partner: Not on file    Emotionally abused: Not on file    Physically abused: Not on file    Forced sexual activity: Not on file  Other Topics Concern  . Not on file  Social History Narrative  . Not on file    Outpatient Medications Prior to Visit  Medication Sig Dispense Refill  . busPIRone (BUSPAR) 10 MG tablet Take 1 tablet (10 mg total) by mouth 3 (three) times daily. 60 tablet 6  . cyclobenzaprine (FLEXERIL) 10 MG tablet TAKE 1 TABLET(10 MG) BY MOUTH THREE TIMES DAILY AS NEEDED FOR MUSCLE SPASMS 30 tablet 3  . furosemide (LASIX) 40 MG tablet TAKE 1 TABLET(40 MG) BY MOUTH TWICE DAILY 60 tablet 3  . Multiple Vitamins-Minerals (ONE-A-DAY WOMENS) tablet Take 1 tablet by mouth daily.    . DULoxetine (CYMBALTA) 20 MG  capsule Take 1 capsule (20 mg total) by mouth daily. 30 capsule 3  . gabapentin (NEURONTIN) 600 MG tablet Take 1 tablet (600 mg total) by mouth 3 (three) times daily. 90 tablet 3  . meloxicam (MOBIC) 15 MG tablet Take 1 tablet (15 mg total) by mouth daily. 30 tablet 2  . pantoprazole (PROTONIX) 20 MG tablet Take 1 tablet (20 mg total) by mouth daily. 30 tablet 1  . butalbital-acetaminophen-caffeine (FIORICET) 50-325-40 MG tablet Take 1-2 tablets by mouth every 6 (six) hours as needed for headache. (Patient not taking: Reported on 07/17/2019) 20 tablet 0  . doxycycline (VIBRA-TABS) 100 MG tablet Take 1 tablet (100 mg total) by mouth 2 (two) times daily. (Patient not taking: Reported on 07/17/2019) 20 tablet 0  . Vitamin D, Ergocalciferol, (DRISDOL) 1.25 MG (50000 UT) CAPS capsule Take 1 capsule (50,000 Units total) by mouth every 7 (seven) days. (Patient not taking: Reported on 07/17/2019) 5 capsule 3   No facility-administered medications prior to visit.     Allergies  Allergen Reactions  . Prednisone     Insomnia "it has me up all night"  . Hydrocodone Rash    ROS Review of Systems  Constitutional: Negative.   HENT: Negative.   Eyes: Negative.   Respiratory: Negative.   Cardiovascular: Negative.   Gastrointestinal: Positive for abdominal distention (obese, bloating.) and abdominal pain (chronic).  Endocrine: Negative.   Genitourinary: Negative.   Musculoskeletal: Positive for back pain (chronic left lower back pain).  Skin: Negative.   Allergic/Immunologic: Negative.   Neurological: Negative.   Hematological: Negative.   Psychiatric/Behavioral: The patient is nervous/anxious.      Objective:    Physical Exam  Constitutional: She is oriented to person, place, and time. She appears well-developed and well-nourished.  HENT:  Head: Normocephalic and atraumatic.  Eyes: Conjunctivae are normal.  Neck: Normal range of motion. Neck supple.  Cardiovascular: Normal rate, regular  rhythm, normal heart sounds and intact distal pulses.  Pulmonary/Chest: Effort normal and breath sounds normal.  Abdominal: Soft. Bowel sounds are normal.  Musculoskeletal: Normal range of motion.  Neurological: She is alert and oriented to person, place, and time. She has normal reflexes.  Skin: Skin is warm and dry.  Psychiatric: She has a normal mood and affect. Her behavior is normal. Judgment and thought content normal.  Nursing note and vitals reviewed.   BP 122/75 (BP Location: Left Arm, Patient Position: Sitting, Cuff Size: Normal)   Pulse (!) 107   Temp 98.3 F (36.8 C) (Oral)   Ht 5\' 4"  (1.626 m)   Wt 178 lb 9.6 oz (81 kg)  LMP 08/14/2016   SpO2 98%   BMI 30.66 kg/m  Wt Readings from Last 3 Encounters:  08/14/19 178 lb 9.6 oz (81 kg)  07/17/19 176 lb 14.4 oz (80.2 kg)  05/17/19 178 lb (80.7 kg)     Health Maintenance Due  Topic Date Due  . PAP SMEAR-Modifier  04/30/2017    There are no preventive care reminders to display for this patient.  Lab Results  Component Value Date   TSH 2.310 02/22/2019   Lab Results  Component Value Date   WBC 3.9 02/22/2019   HGB 14.0 02/22/2019   HCT 41.8 02/22/2019   MCV 92 02/22/2019   PLT 173 02/22/2019   Lab Results  Component Value Date   NA 142 04/01/2019   K 4.0 04/01/2019   CO2 31 04/01/2019   GLUCOSE 107 (H) 04/01/2019   BUN 13 04/01/2019   CREATININE 0.86 04/01/2019   BILITOT <0.2 02/22/2019   ALKPHOS 70 02/22/2019   AST 25 02/22/2019   ALT 28 02/22/2019   PROT 7.4 02/22/2019   ALBUMIN 4.5 02/22/2019   CALCIUM 9.6 04/01/2019   ANIONGAP 11 04/01/2019   Lab Results  Component Value Date   CHOL 217 (H) 02/22/2019   Lab Results  Component Value Date   HDL 42 02/22/2019   Lab Results  Component Value Date   LDLCALC 138 (H) 02/22/2019   Lab Results  Component Value Date   TRIG 184 (H) 02/22/2019   Lab Results  Component Value Date   CHOLHDL 5.2 (H) 02/22/2019   No results found for: HGBA1C   Assessment & Plan:   1. Neuropathic pain - DULoxetine (CYMBALTA) 20 MG capsule; Take 1 capsule (20 mg total) by mouth daily.  Dispense: 90 capsule; Refill: 2 - gabapentin (NEURONTIN) 600 MG tablet; Take 1 tablet (600 mg total) by mouth 3 (three) times daily.  Dispense: 270 tablet; Refill: 2  2. Chronic left-sided low back pain without sciatica Continue pain medications as prescribed.   3. Chronic abdominal pain - meloxicam (MOBIC) 15 MG tablet; Take 1 tablet (15 mg total) by mouth daily.  Dispense: 90 tablet; Refill: 2  4. Anxiety and depression - DULoxetine (CYMBALTA) 20 MG capsule; Take 1 capsule (20 mg total) by mouth daily.  Dispense: 90 capsule; Refill: 2  5. Hot flashes Stable. Not worsening.  - DULoxetine (CYMBALTA) 20 MG capsule; Take 1 capsule (20 mg total) by mouth daily.  Dispense: 90 capsule; Refill: 2 - gabapentin (NEURONTIN) 600 MG tablet; Take 1 tablet (600 mg total) by mouth 3 (three) times daily.  Dispense: 270 tablet; Refill: 2  6. Vitamin D deficiency - Vitamin D, Ergocalciferol, (DRISDOL) 1.25 MG (50000 UT) CAPS capsule; Take 1 capsule (50,000 Units total) by mouth every 7 (seven) days.  Dispense: 5 capsule; Refill: 3  7. Abdominal distension (gaseous) - pantoprazole (PROTONIX) 20 MG tablet; Take 1 tablet (20 mg total) by mouth daily.  Dispense: 90 tablet; Refill: 2  8. Screening for breast cancer  9. Healthcare maintenance - HgB A1c - Urinalysis Dipstick  10. Follow up She will follow up in 6 months.   Meds ordered this encounter  Medications  . DULoxetine (CYMBALTA) 20 MG capsule    Sig: Take 1 capsule (20 mg total) by mouth daily.    Dispense:  90 capsule    Refill:  2  . gabapentin (NEURONTIN) 600 MG tablet    Sig: Take 1 tablet (600 mg total) by mouth 3 (three) times daily.  Dispense:  270 tablet    Refill:  2  . Vitamin D, Ergocalciferol, (DRISDOL) 1.25 MG (50000 UT) CAPS capsule    Sig: Take 1 capsule (50,000 Units total) by mouth every 7  (seven) days.    Dispense:  5 capsule    Refill:  3  . pantoprazole (PROTONIX) 20 MG tablet    Sig: Take 1 tablet (20 mg total) by mouth daily.    Dispense:  90 tablet    Refill:  2  . meloxicam (MOBIC) 15 MG tablet    Sig: Take 1 tablet (15 mg total) by mouth daily.    Dispense:  90 tablet    Refill:  2    Orders Placed This Encounter  Procedures  . HgB A1c  . Urinalysis Dipstick    Referral Orders  No referral(s) requested today    Kathe Becton,  MSN, FNP-BC [place], [place] [address] [phone] fax     Problem List Items Addressed This Visit      Other   Abdominal distension (gaseous)   Relevant Medications   pantoprazole (PROTONIX) 20 MG tablet   Anxiety and depression   Relevant Medications   DULoxetine (CYMBALTA) 20 MG capsule   Neuropathic pain - Primary   Relevant Medications   DULoxetine (CYMBALTA) 20 MG capsule   gabapentin (NEURONTIN) 600 MG tablet    Other Visit Diagnoses    Chronic left-sided low back pain without sciatica       Relevant Medications   meloxicam (MOBIC) 15 MG tablet   Chronic abdominal pain       Relevant Medications   DULoxetine (CYMBALTA) 20 MG capsule   gabapentin (NEURONTIN) 600 MG tablet   meloxicam (MOBIC) 15 MG tablet   Hot flashes       Relevant Medications   DULoxetine (CYMBALTA) 20 MG capsule   gabapentin (NEURONTIN) 600 MG tablet   Vitamin D deficiency       Relevant Medications   Vitamin D, Ergocalciferol, (DRISDOL) 1.25 MG (50000 UT) CAPS capsule   Screening for breast cancer       Healthcare maintenance       Relevant Orders   HgB A1c   Urinalysis Dipstick   Follow up          Meds ordered this encounter  Medications  . DULoxetine (CYMBALTA) 20 MG capsule    Sig: Take 1 capsule (20 mg total) by mouth daily.    Dispense:  90 capsule    Refill:  2  . gabapentin (NEURONTIN) 600 MG tablet     Sig: Take 1 tablet (600 mg total) by mouth 3 (three) times daily.    Dispense:  270 tablet    Refill:  2  . Vitamin D, Ergocalciferol, (DRISDOL) 1.25 MG (50000 UT) CAPS capsule    Sig: Take 1 capsule (50,000 Units total) by mouth every 7 (seven) days.    Dispense:  5 capsule    Refill:  3  . pantoprazole (PROTONIX) 20 MG tablet    Sig: Take 1 tablet (20 mg total) by mouth daily.    Dispense:  90 tablet    Refill:  2  . meloxicam (MOBIC) 15 MG tablet    Sig: Take 1 tablet (15 mg total) by mouth daily.    Dispense:  90 tablet    Refill:  2    Follow-up: Return in about 6 months (around 02/11/2020).  Azzie Glatter, FNP

## 2019-08-26 ENCOUNTER — Ambulatory Visit: Payer: Medicaid Other | Admitting: Family Medicine

## 2019-08-28 ENCOUNTER — Other Ambulatory Visit: Payer: Self-pay

## 2019-08-28 ENCOUNTER — Ambulatory Visit
Admission: RE | Admit: 2019-08-28 | Discharge: 2019-08-28 | Disposition: A | Discharge status: Home / Self Care | Payer: Medicaid Other | Source: Ambulatory Visit | Attending: Obstetrics and Gynecology | Admitting: Obstetrics and Gynecology

## 2019-08-28 DIAGNOSIS — Z1231 Encounter for screening mammogram for malignant neoplasm of breast: Secondary | ICD-10-CM | POA: Diagnosis not present

## 2019-08-28 DIAGNOSIS — Z1239 Encounter for other screening for malignant neoplasm of breast: Secondary | ICD-10-CM

## 2019-09-04 ENCOUNTER — Other Ambulatory Visit: Payer: Self-pay | Admitting: Family Medicine

## 2019-09-04 DIAGNOSIS — M62838 Other muscle spasm: Secondary | ICD-10-CM

## 2019-09-04 NOTE — Telephone Encounter (Signed)
Refill request for cyclobenzaprine 10mg . Please advise.

## 2019-09-16 ENCOUNTER — Telehealth: Payer: Self-pay | Admitting: Family Medicine

## 2019-09-24 NOTE — Telephone Encounter (Signed)
Sent to Np °

## 2019-09-25 ENCOUNTER — Encounter: Payer: Self-pay | Admitting: Family Medicine

## 2019-09-25 NOTE — Telephone Encounter (Signed)
Pt informed letter is ready for pick up

## 2019-09-30 ENCOUNTER — Telehealth: Payer: Self-pay

## 2019-09-30 NOTE — Telephone Encounter (Signed)
Patient called today and is saying her hernia is bothering her and she is ready for a referral to take care of this matter. Can you please refer her?

## 2019-10-01 ENCOUNTER — Other Ambulatory Visit: Payer: Self-pay | Admitting: Family Medicine

## 2019-10-01 DIAGNOSIS — R7303 Prediabetes: Secondary | ICD-10-CM

## 2019-10-01 MED ORDER — METFORMIN HCL 500 MG PO TABS: 500.0000 mg | ORAL_TABLET | Freq: Two times a day (BID) | ORAL | 3 refills | Status: DC

## 2019-10-01 NOTE — Telephone Encounter (Signed)
Called, no answer. Left a message.  

## 2019-10-01 NOTE — Telephone Encounter (Signed)
Patient called back and was given instructions to start Metformin for pre-diabetes. Advised that hernia is very small and abd discomfort could be because of her diet and fatty liver. She was asked to work on her diet (heart healthy or dash) and to increase activity to try to reduce weight. Advised if symptoms worsen to call and schedule an appointment. Thanks!

## 2019-10-29 ENCOUNTER — Other Ambulatory Visit: Payer: Self-pay

## 2019-10-29 ENCOUNTER — Ambulatory Visit (HOSPITAL_COMMUNITY)
Admission: EM | Admit: 2019-10-29 | Discharge: 2019-10-29 | Disposition: A | Discharge status: Home / Self Care | Payer: Medicaid Other | Attending: Family Medicine | Admitting: Family Medicine

## 2019-10-29 ENCOUNTER — Encounter (HOSPITAL_COMMUNITY): Payer: Self-pay

## 2019-10-29 DIAGNOSIS — M109 Gout, unspecified: Secondary | ICD-10-CM

## 2019-10-29 MED ORDER — INDOMETHACIN 50 MG PO CAPS: 50.0000 mg | ORAL_CAPSULE | Freq: Two times a day (BID) | ORAL | 3 refills | Status: DC

## 2019-10-29 NOTE — ED Provider Notes (Signed)
[facility]    CSN: [id number] Arrival date & time: 10/29/19  1257      History   Chief Complaint Chief Complaint  Patient presents with  . Foot Pain    HPI Brianna Velez is a 43 y.o. female.   43 year old established [facility] urgent care patient who presents with complaints of left foot pain since a week ago. Patient reports the pain is crampy and throbbing, is made worse when she massages it  Spontaneous onset.  Patient is a nondrinker.  No prior history of this.     Past Medical History:  Diagnosis Date  . Anxiety and depression   . Fibroids   . History of hiatal hernia   . Leg pain   . Migraine   . Sciatica     Patient Active Problem List   Diagnosis Date Noted  . Chronic left-sided low back pain without sciatica 08/14/2019  . Chronic abdominal pain 08/14/2019  . Vitamin D deficiency 08/14/2019  . Postmenopausal vaginal bleeding 06/05/2019  . Pneumonia of left lower lobe due to infectious organism 06/05/2019  . Cough 06/05/2019  . Generalized abdominal pain 05/21/2019  . Neuropathic pain 05/08/2019  . Anxiety and depression 05/08/2019  . Assistance needed with transportation 05/08/2019  . Peripheral edema 04/18/2019  . Abdominal distension (gaseous) 04/18/2019  . Flatulence/gas pain/belching 04/18/2019  . Intractable migraine without status migrainosus 04/18/2019  . Chronic pain syndrome 03/31/2016  . Screening for diabetes mellitus 12/31/2015  . S/P TAH (total abdominal hysterectomy) 09/29/2015  . Fibroid, uterine   . Menorrhagia with regular cycle   . Fibroid uterus 07/01/2015  . Left hip pain 11/06/2014    Past Surgical History:  Procedure Laterality Date  . ABDOMINAL HYSTERECTOMY  09/29/2015   Procedure: HYSTERECTOMY ABDOMINAL;  Surgeon: Mora Bellman, MD;  Location: [facility];  Service: Gynecology;;  . BILATERAL SALPINGECTOMY Bilateral 09/29/2015   Procedure: BILATERAL SALPINGECTOMY;  Surgeon: Mora Bellman, MD;  Location: [facility];  Service: Gynecology;  Laterality: Bilateral;  . TUBAL LIGATION    . WISDOM TOOTH EXTRACTION      OB History    Gravida  2   Para  2   Term  2   Preterm  0   AB  0   Living  2     SAB  0   TAB  0   Ectopic  0   Multiple  0   Live Births               Home Medications    Prior to Admission medications   Medication Sig Start Date End Date Taking? Authorizing Provider  furosemide (LASIX) 40 MG tablet TAKE 1 TABLET(40 MG) BY MOUTH TWICE DAILY 08/13/19  Yes Azzie Glatter, FNP  busPIRone (BUSPAR) 10 MG tablet Take 1 tablet (10 mg total) by mouth 3 (three) times daily. 02/22/19   Azzie Glatter, FNP  cyclobenzaprine (FLEXERIL) 10 MG tablet TAKE 1 TABLET(10 MG) BY MOUTH THREE TIMES DAILY AS NEEDED FOR MUSCLE SPASMS 09/04/19   Azzie Glatter, FNP  DULoxetine (CYMBALTA) 20 MG capsule Take 1 capsule (20 mg total) by mouth daily. 08/14/19   Azzie Glatter, FNP  gabapentin (NEURONTIN) 600 MG tablet Take 1 tablet (600 mg total) by mouth 3 (three) times daily. 08/14/19   Azzie Glatter, FNP  indomethacin (INDOCIN) 50 MG capsule Take 1 capsule (50 mg total) by mouth 2 (two) times daily with a meal. 10/29/19   Robyn Haber, MD  meloxicam (MOBIC) 15 MG tablet Take 1 tablet (15 mg total) by mouth daily. 08/14/19   Azzie Glatter, FNP  metFORMIN (GLUCOPHAGE) 500 MG tablet Take 1 tablet (500 mg total) by mouth 2 (two) times daily with a meal. 10/01/19   Azzie Glatter, FNP  Multiple Vitamins-Minerals (ONE-A-DAY WOMENS) tablet Take 1 tablet by mouth daily.    [provider]  pantoprazole (PROTONIX) 20 MG tablet Take 1 tablet (20 mg total) by mouth daily. 08/14/19   Azzie Glatter, FNP  Vitamin D, Ergocalciferol, (DRISDOL) 1.25 MG (50000 UT) CAPS capsule Take 1 capsule (50,000 Units total) by mouth every 7 (seven) days. 08/14/19   Azzie Glatter, FNP    Family History Family History  Problem Relation Age of Onset  . Diabetes Sister   . Heart disease  Paternal Grandmother   . Hypertension Father   . Dementia Mother   . Hypertension Mother     Social History Social History   Tobacco Use  . Smoking status: Former Smoker    Quit date: 12/05/1996    Years since quitting: 22.9  . Smokeless tobacco: Never Used  Substance Use Topics  . Alcohol use: No  . Drug use: No     Allergies   Prednisone and Hydrocodone   Review of Systems Review of Systems  Musculoskeletal: Positive for gait problem and joint swelling.  All other systems reviewed and are negative.    Physical Exam Triage Vital Signs ED Triage Vitals  Enc Vitals Group     BP 10/29/19 1342 131/79     Pulse Rate 10/29/19 1342 89     Resp 10/29/19 1342 15     Temp 10/29/19 1342 98.2 F (36.8 C)     Temp Source 10/29/19 1342 Oral     SpO2 10/29/19 1342 100 %     Weight --      Height --      Head Circumference --      Peak Flow --      Pain Score 10/29/19 1341 9     Pain Loc --      Pain Edu? --      Excl. in [place]? --    No data found.  Updated Vital Signs BP 131/79 (BP Location: Right Arm)   Pulse 89   Temp 98.2 F (36.8 C) (Oral)   Resp 15   LMP 08/14/2016   SpO2 100%    Physical Exam Vitals signs and nursing note reviewed.  Constitutional:      Appearance: Normal appearance.  HENT:     Head: Normocephalic.  Eyes:     Conjunctiva/sclera: Conjunctivae normal.  Neck:     Musculoskeletal: Normal range of motion and neck supple.  Cardiovascular:     Rate and Rhythm: Normal rate.     Pulses: Normal pulses.  Pulmonary:     Effort: Pulmonary effort is normal.     Breath sounds: Normal breath sounds.  Musculoskeletal:        General: Swelling and tenderness present.     Comments: Swollen tender MTP joint of great toe on left side  Neurological:     General: No focal deficit present.     Mental Status: She is alert and oriented to person, place, and time.     Gait: Gait abnormal.  Psychiatric:        Mood and Affect: Mood normal.         Behavior: Behavior normal.  UC Treatments / Results  Labs (all labs ordered are listed, but only abnormal results are displayed) Labs Reviewed - No data to display  EKG   Radiology No results found.  Procedures Procedures (including critical care time)  Medications Ordered in UC Medications - No data to display  Initial Impression / Assessment and Plan / UC Course  I have reviewed the triage vital signs and the nursing notes.  Pertinent labs & imaging results that were available during my care of the patient were reviewed by me and considered in my medical decision making (see chart for details).    Final Clinical Impressions(s) / UC Diagnoses   Final diagnoses:  Acute gout of left foot, unspecified cause   Discharge Instructions   None    ED Prescriptions    Medication Sig Dispense Auth. Provider   indomethacin (INDOCIN) 50 MG capsule Take 1 capsule (50 mg total) by mouth 2 (two) times daily with a meal. 10 capsule Robyn Haber, MD     I have reviewed the PDMP during this encounter.   Robyn Haber, MD 10/29/19 [phone]

## 2019-10-29 NOTE — ED Triage Notes (Signed)
Patient presents to Urgent Care with complaints of left foot pain since a week ago. Patient reports the pain is crampy and throbbing, is made worse when she massages it

## 2019-11-07 ENCOUNTER — Other Ambulatory Visit: Payer: Self-pay | Admitting: Family Medicine

## 2019-11-07 DIAGNOSIS — E559 Vitamin D deficiency, unspecified: Secondary | ICD-10-CM

## 2019-11-07 DIAGNOSIS — R140 Abdominal distension (gaseous): Secondary | ICD-10-CM

## 2019-11-12 ENCOUNTER — Other Ambulatory Visit: Payer: Self-pay | Admitting: Family Medicine

## 2019-11-12 DIAGNOSIS — M62838 Other muscle spasm: Secondary | ICD-10-CM

## 2019-11-14 NOTE — Telephone Encounter (Signed)
FLEXERIL 10 MG refill. Please advise.

## 2019-12-10 ENCOUNTER — Other Ambulatory Visit: Payer: Self-pay | Admitting: Family Medicine

## 2019-12-21 DIAGNOSIS — Z8616 Personal history of COVID-19: Secondary | ICD-10-CM

## 2019-12-21 HISTORY — DX: Personal history of COVID-19: Z86.16

## 2020-01-13 ENCOUNTER — Other Ambulatory Visit: Payer: Self-pay | Admitting: Family Medicine

## 2020-01-13 ENCOUNTER — Telehealth: Payer: Self-pay | Admitting: Family Medicine

## 2020-01-13 DIAGNOSIS — M62838 Other muscle spasm: Secondary | ICD-10-CM

## 2020-01-13 MED ORDER — CYCLOBENZAPRINE HCL 10 MG PO TABS: ORAL_TABLET | ORAL | 3 refills | Status: DC

## 2020-01-14 ENCOUNTER — Telehealth: Payer: Self-pay | Admitting: Family Medicine

## 2020-01-14 NOTE — Telephone Encounter (Signed)
Please call pt back about med refill for meloxicam. Pt takes it twice a day and has run out. Medication prescribed for once a day. Medication has refills. Pharmacy will not fill any refills until next month.

## 2020-01-14 NOTE — Telephone Encounter (Signed)
Error

## 2020-01-14 NOTE — Telephone Encounter (Signed)
Brianna Velez  reports taking Mobic twice daily. Therefore she has run out of Mobic. Patient  has  requested a new Rx for BID use.  Patient has refills left but they are due to be released. Please advise.    Per patient she is still taking all of her other medications but the arthritis pain is bad.

## 2020-01-20 ENCOUNTER — Telehealth: Payer: Self-pay

## 2020-01-20 NOTE — Telephone Encounter (Signed)
Caller: Unspecified (6 days ago, 12:32 PM)  Patient is to continue Mobic and Gabapentin as prescribed. We will refer her to Physical Therapy for her Chronic Arthritis if she requests.       Medication Refill  Azzie Glatter, FNP  You 2 days ago   Patient is to continue Mobic and Gabapentin as prescribed. We will refer her to Physical Therapy for her Chronic Arthritis if she requests.    Message text   You  Azzie Glatter, FNP 5 days ago  RP Is there anything you can do for about her Mobic refills?   Message text   You 6 days ago  RP   Brianna Velez  reports taking Mobic twice daily. Therefore she has run out of Mobic. Patient  has  requested a new Rx for BID use.  Patient has refills left but they are due to be released. Please advise.    Per patient she is still taking all of her other medications but the arthritis pain is bad.       Documentation   [name], [name] routed conversation to You 6 days ago  [name] 6 days ago  CM   Please call pt back about med refill for meloxicam. Pt takes it twice a day and has run out. Medication prescribed for once a day. Medication has refills. Pharmacy will not fill any refills until next month.      Documentation    Message left on patient voicemail.

## 2020-01-21 ENCOUNTER — Other Ambulatory Visit: Payer: Self-pay | Admitting: Family Medicine

## 2020-01-21 DIAGNOSIS — R7303 Prediabetes: Secondary | ICD-10-CM

## 2020-02-10 ENCOUNTER — Telehealth: Payer: Self-pay | Admitting: Family Medicine

## 2020-02-10 NOTE — Telephone Encounter (Signed)
Pt called and reminded of appointment 

## 2020-02-10 NOTE — Telephone Encounter (Signed)
Pt was called and reminded of there appointment 

## 2020-02-11 ENCOUNTER — Encounter: Payer: Self-pay | Admitting: Family Medicine

## 2020-02-11 ENCOUNTER — Ambulatory Visit (INDEPENDENT_AMBULATORY_CARE_PROVIDER_SITE_OTHER): Payer: Medicaid Other | Admitting: Family Medicine

## 2020-02-11 ENCOUNTER — Other Ambulatory Visit: Payer: Self-pay

## 2020-02-11 VITALS — BP 109/68 | HR 100 | Temp 97.6°F | Ht 64.0 in | Wt 191.0 lb

## 2020-02-11 DIAGNOSIS — R232 Flushing: Secondary | ICD-10-CM | POA: Diagnosis not present

## 2020-02-11 DIAGNOSIS — Z09 Encounter for follow-up examination after completed treatment for conditions other than malignant neoplasm: Secondary | ICD-10-CM

## 2020-02-11 DIAGNOSIS — G8929 Other chronic pain: Secondary | ICD-10-CM

## 2020-02-11 DIAGNOSIS — M545 Low back pain, unspecified: Secondary | ICD-10-CM

## 2020-02-11 DIAGNOSIS — F419 Anxiety disorder, unspecified: Secondary | ICD-10-CM

## 2020-02-11 DIAGNOSIS — K219 Gastro-esophageal reflux disease without esophagitis: Secondary | ICD-10-CM

## 2020-02-11 DIAGNOSIS — Z0000 Encounter for general adult medical examination without abnormal findings: Secondary | ICD-10-CM | POA: Diagnosis not present

## 2020-02-11 DIAGNOSIS — G894 Chronic pain syndrome: Secondary | ICD-10-CM | POA: Diagnosis not present

## 2020-02-11 DIAGNOSIS — R109 Unspecified abdominal pain: Secondary | ICD-10-CM | POA: Diagnosis not present

## 2020-02-11 DIAGNOSIS — F329 Major depressive disorder, single episode, unspecified: Secondary | ICD-10-CM

## 2020-02-11 DIAGNOSIS — R143 Flatulence: Secondary | ICD-10-CM

## 2020-02-11 DIAGNOSIS — R140 Abdominal distension (gaseous): Secondary | ICD-10-CM | POA: Diagnosis not present

## 2020-02-11 DIAGNOSIS — R7303 Prediabetes: Secondary | ICD-10-CM | POA: Diagnosis not present

## 2020-02-11 DIAGNOSIS — M792 Neuralgia and neuritis, unspecified: Secondary | ICD-10-CM

## 2020-02-11 DIAGNOSIS — G4700 Insomnia, unspecified: Secondary | ICD-10-CM

## 2020-02-11 DIAGNOSIS — F32A Depression, unspecified: Secondary | ICD-10-CM

## 2020-02-11 HISTORY — DX: Insomnia, unspecified: G47.00

## 2020-02-11 LAB — POCT URINALYSIS DIPSTICK
Bilirubin, UA: NEGATIVE
Blood, UA: NEGATIVE
Glucose, UA: NEGATIVE
Ketones, UA: NEGATIVE
Leukocytes, UA: NEGATIVE
Nitrite, UA: NEGATIVE
Protein, UA: NEGATIVE
Spec Grav, UA: >=1.03 — AB (ref 1.010–1.025)
Urobilinogen, UA: 0.2 E.U./dL
pH, UA: 6.5 (ref 5.0–8.0)

## 2020-02-11 LAB — GLUCOSE, POCT (MANUAL RESULT ENTRY): POC Glucose: 177 mg/dl — AB (ref 70–99)

## 2020-02-11 MED ORDER — MELOXICAM 15 MG PO TABS: 15.0000 mg | ORAL_TABLET | Freq: Every day | ORAL | 6 refills | Status: DC

## 2020-02-11 NOTE — Progress Notes (Signed)
Patient [facility]  Established Patient Office Visit  Subjective:  Patient ID: Brianna Velez, female    DOB: 1976-02-25  Age: 44 y.o. MRN: [id number]  CC:  Chief Complaint  Patient presents with  . Follow-up    HPI Brianna Velez is a 44 year female who presents for Follow Up today.   Past Medical History:  Diagnosis Date  . Anxiety and depression   . Fibroids   . History of 2019 novel coronavirus disease (COVID-19) 12/21/2019  . History of hiatal hernia   . Insomnia 02/11/2020  . Leg pain   . Migraine   . Sciatica    Current Status: Since her last office visit, she states she previously was diagnosed with Coronavirus 12/21/2019 after testing at facility in [facility]. Her anxiety is moderate today. She denies suicidal ideations, homicidal ideations, or auditory hallucinations. She continues to have abdominal pain and bloating.  Denies reports of GI problems such as nausea, vomiting, diarrhea, and constipation. She has no reports of blood in stools, dysuria and hematuria. She denies fevers, chills, fatigue, recent infections, weight loss, and night sweats. She has not had any headaches, visual changes, dizziness, and falls. No chest pain, heart palpitations, cough and shortness of breath reported.  She denies pain today.   Past Surgical History:  Procedure Laterality Date  . ABDOMINAL HYSTERECTOMY  09/29/2015   Procedure: HYSTERECTOMY ABDOMINAL;  Surgeon: Mora Bellman, MD;  Location: [facility];  Service: Gynecology;;  . BILATERAL SALPINGECTOMY Bilateral 09/29/2015   Procedure: BILATERAL SALPINGECTOMY;  Surgeon: Mora Bellman, MD;  Location: [facility];  Service: Gynecology;  Laterality: Bilateral;  . TUBAL LIGATION    . WISDOM TOOTH EXTRACTION      Family History  Problem Relation Age of Onset  . Diabetes Sister   . Heart disease Paternal Grandmother   . Hypertension Father   . Dementia Mother   . Hypertension Mother     Social  History   Socioeconomic History  . Marital status: Single    Spouse name: Not on file  . Number of children: Not on file  . Years of education: Not on file  . Highest education level: Not on file  Occupational History  . Not on file  Tobacco Use  . Smoking status: Former Smoker    Quit date: 12/05/1996    Years since quitting: 23.2  . Smokeless tobacco: Never Used  Substance and Sexual Activity  . Alcohol use: No  . Drug use: No  . Sexual activity: Not Currently    Birth control/protection: Surgical  Other Topics Concern  . Not on file  Social History Narrative  . Not on file   Social Determinants of Health   Financial Resource Strain:   . Difficulty of Paying Living Expenses: Not on file  Food Insecurity:   . Worried About Charity fundraiser in the Last Year: Not on file  . Ran Out of Food in the Last Year: Not on file  Transportation Needs:   . Lack of Transportation (Medical): Not on file  . Lack of Transportation (Non-Medical): Not on file  Physical Activity:   . Days of Exercise per Week: Not on file  . Minutes of Exercise per Session: Not on file  Stress:   . Feeling of Stress : Not on file  Social Connections:   . Frequency of Communication with Friends and Family: Not on file  . Frequency of Social Gatherings with Friends and Family:  Not on file  . Attends Religious Services: Not on file  . Active Member of Clubs or Organizations: Not on file  . Attends Archivist Meetings: Not on file  . Marital Status: Not on file  Intimate Partner Violence:   . Fear of Current or Ex-Partner: Not on file  . Emotionally Abused: Not on file  . Physically Abused: Not on file  . Sexually Abused: Not on file    Outpatient Medications Prior to Visit  Medication Sig Dispense Refill  . busPIRone (BUSPAR) 10 MG tablet Take 1 tablet (10 mg total) by mouth 3 (three) times daily. 60 tablet 6  . cyclobenzaprine (FLEXERIL) 10 MG tablet TAKE 1 TABLET(10 MG) BY MOUTH THREE  TIMES DAILY AS NEEDED FOR MUSCLE SPASMS 30 tablet 3  . DULoxetine (CYMBALTA) 20 MG capsule Take 1 capsule (20 mg total) by mouth daily. 90 capsule 2  . furosemide (LASIX) 40 MG tablet TAKE 1 TABLET(40 MG) BY MOUTH TWICE DAILY 60 tablet 3  . gabapentin (NEURONTIN) 600 MG tablet Take 1 tablet (600 mg total) by mouth 3 (three) times daily. 270 tablet 2  . indomethacin (INDOCIN) 50 MG capsule Take 1 capsule (50 mg total) by mouth 2 (two) times daily with a meal. 10 capsule 3  . metFORMIN (GLUCOPHAGE) 500 MG tablet TAKE 1 TABLET(500 MG) BY MOUTH TWICE DAILY WITH A MEAL 60 tablet 3  . Multiple Vitamins-Minerals (ONE-A-DAY WOMENS) tablet Take 1 tablet by mouth daily.    . pantoprazole (PROTONIX) 20 MG tablet TAKE 1 TABLET(20 MG) BY MOUTH DAILY 90 tablet 2  . Vitamin D, Ergocalciferol, (DRISDOL) 1.25 MG (50000 UT) CAPS capsule TAKE 1 CAPSULE BY MOUTH EVERY 7 DAYS 5 capsule 3  . meloxicam (MOBIC) 15 MG tablet Take 1 tablet (15 mg total) by mouth daily. 90 tablet 2   No facility-administered medications prior to visit.    Allergies  Allergen Reactions  . Prednisone     Insomnia "it has me up all night"  . Hydrocodone Rash    ROS Review of Systems  Constitutional: Negative.   HENT: Negative.   Eyes: Negative.   Respiratory: Negative.   Cardiovascular: Negative.   Gastrointestinal: Positive for abdominal distention.  Endocrine: Negative.   Genitourinary: Negative.   Musculoskeletal: Positive for arthralgias (generalized joint pain).  Skin: Negative.   Allergic/Immunologic: Negative.   Neurological: Positive for dizziness (occasional ) and headaches (occasional ).  Hematological: Negative.   Psychiatric/Behavioral: Negative.       Objective:    Physical Exam  Constitutional: She is oriented to person, place, and time. She appears well-developed and well-nourished.  HENT:  Head: Normocephalic and atraumatic.  Eyes: Conjunctivae are normal.  Cardiovascular: Normal rate, regular  rhythm, normal heart sounds and intact distal pulses.  Pulmonary/Chest: Effort normal and breath sounds normal.  Abdominal: Soft. Bowel sounds are normal.  Musculoskeletal:        General: Normal range of motion.     Cervical back: Normal range of motion and neck supple.  Neurological: She is alert and oriented to person, place, and time. She has normal reflexes.  Skin: Skin is warm.  Psychiatric: She has a normal mood and affect. Her behavior is normal. Judgment and thought content normal.  Nursing note and vitals reviewed.   BP 109/68   Pulse 100   Temp 97.6 F (36.4 C)   Ht 5\' 4"  (1.626 m)   Wt 191 lb (86.6 kg)   LMP 08/14/2016   SpO2 99%  BMI 32.79 kg/m  Wt Readings from Last 3 Encounters:  02/11/20 191 lb (86.6 kg)  08/14/19 178 lb 9.6 oz (81 kg)  07/17/19 176 lb 14.4 oz (80.2 kg)     Health Maintenance Due  Topic Date Due  . PAP SMEAR-Modifier  04/30/2017    There are no preventive care reminders to display for this patient.  Lab Results  Component Value Date   TSH 2.310 02/22/2019   Lab Results  Component Value Date   WBC 3.9 02/22/2019   HGB 14.0 02/22/2019   HCT 41.8 02/22/2019   MCV 92 02/22/2019   PLT 173 02/22/2019   Lab Results  Component Value Date   NA 142 04/01/2019   K 4.0 04/01/2019   CO2 31 04/01/2019   GLUCOSE 107 (H) 04/01/2019   BUN 13 04/01/2019   CREATININE 0.86 04/01/2019   BILITOT <0.2 02/22/2019   ALKPHOS 70 02/22/2019   AST 25 02/22/2019   ALT 28 02/22/2019   PROT 7.4 02/22/2019   ALBUMIN 4.5 02/22/2019   CALCIUM 9.6 04/01/2019   ANIONGAP 11 04/01/2019   Lab Results  Component Value Date   CHOL 217 (H) 02/22/2019   Lab Results  Component Value Date   HDL 42 02/22/2019   Lab Results  Component Value Date   LDLCALC 138 (H) 02/22/2019   Lab Results  Component Value Date   TRIG 184 (H) 02/22/2019   Lab Results  Component Value Date   CHOLHDL 5.2 (H) 02/22/2019   Lab Results  Component Value Date   HGBA1C  5.8 (A) 08/14/2019   HGBA1C 5.8 08/14/2019   HGBA1C 5.8 08/14/2019   HGBA1C 5.8 08/14/2019      Assessment & Plan:   1. Pre-diabetes She will continue medication as prescribed, to decrease foods/beverages high in sugars and carbs and follow Heart Healthy or DASH diet. Increase physical activity to at least 30 minutes cardio exercise daily.   2. Anxiety and depression Mild today. Monitor.   3. Abdominal distension (gaseous)  4. Hot flashes Stable.   5. Chronic abdominal pain  6. Gastroesophageal reflux disease without esophagitis  7. Flatulence  8. Chronic left-sided low back pain without sciatica - meloxicam (MOBIC) 15 MG tablet; Take 1 tablet (15 mg total) by mouth daily.  Dispense: 60 tablet; Refill: 6  9. Chronic pain syndrome - meloxicam (MOBIC) 15 MG tablet; Take 1 tablet (15 mg total) by mouth daily.  Dispense: 60 tablet; Refill: 6  10. Neuropathic pain  11. Insomnia, unspecified type  12. Healthcare maintenance - POCT urinalysis dipstick - POCT glycosylated hemoglobin (Hb A1C) - POCT glucose (manual entry) - Hemoglobin A1c - CBC with Differential - Comprehensive metabolic panel - Lipid Panel - TSH - Vitamin B12 - Vitamin D, 25-hydroxy  13. Follow up She will follow up in 3 months.   Meds ordered this encounter  Medications  . meloxicam (MOBIC) 15 MG tablet    Sig: Take 1 tablet (15 mg total) by mouth daily.    Dispense:  60 tablet    Refill:  6    Orders Placed This Encounter  Procedures  . Hemoglobin A1c  . CBC with Differential  . Comprehensive metabolic panel  . Lipid Panel  . TSH  . Vitamin B12  . Vitamin D, 25-hydroxy  . POCT urinalysis dipstick  . POCT glycosylated hemoglobin (Hb A1C)  . POCT glucose (manual entry)    Referral Orders  No referral(s) requested today    Kathe Becton,  MSN, FNP-BC  [facility] [address] [place], [place] [address]  [phone] fax  Problem List Items Addressed This Visit      Other   Abdominal distension (gaseous)   Anxiety and depression   Chronic abdominal pain   Relevant Medications   meloxicam (MOBIC) 15 MG tablet   Chronic left-sided low back pain without sciatica   Relevant Medications   meloxicam (MOBIC) 15 MG tablet   Chronic pain syndrome   Relevant Medications   meloxicam (MOBIC) 15 MG tablet   Neuropathic pain    Other Visit Diagnoses    Pre-diabetes    -  Primary   Hot flashes       Gastroesophageal reflux disease without esophagitis       Flatulence       Insomnia, unspecified type       Healthcare maintenance       Relevant Orders   POCT urinalysis dipstick (Completed)   POCT glycosylated hemoglobin (Hb A1C)   POCT glucose (manual entry) (Completed)   Hemoglobin A1c   CBC with Differential   Comprehensive metabolic panel   Lipid Panel   TSH   Vitamin B12   Vitamin D, 25-hydroxy   Follow up          Meds ordered this encounter  Medications  . meloxicam (MOBIC) 15 MG tablet    Sig: Take 1 tablet (15 mg total) by mouth daily.    Dispense:  60 tablet    Refill:  6    Follow-up: Return in about 3 months (around 05/13/2020).    Azzie Glatter, FNP

## 2020-02-12 LAB — CBC WITH DIFFERENTIAL/PLATELET
Basophils Absolute: 0 10*3/uL (ref 0.0–0.2)
Basos: 1 %
EOS (ABSOLUTE): 0.1 10*3/uL (ref 0.0–0.4)
Eos: 2 %
Hematocrit: 40.5 % (ref 34.0–46.6)
Hemoglobin: 13.7 g/dL (ref 11.1–15.9)
Immature Grans (Abs): 0 10*3/uL (ref 0.0–0.1)
Immature Granulocytes: 1 %
Lymphocytes Absolute: 1.1 10*3/uL (ref 0.7–3.1)
Lymphs: 24 %
MCH: 30.4 pg (ref 26.6–33.0)
MCHC: 33.8 g/dL (ref 31.5–35.7)
MCV: 90 fL (ref 79–97)
Monocytes Absolute: 0.3 10*3/uL (ref 0.1–0.9)
Monocytes: 6 %
Neutrophils Absolute: 3 10*3/uL (ref 1.4–7.0)
Neutrophils: 66 %
Platelets: 181 10*3/uL (ref 150–450)
RBC: 4.5 x10E6/uL (ref 3.77–5.28)
RDW: 12.7 % (ref 11.7–15.4)
WBC: 4.4 10*3/uL (ref 3.4–10.8)

## 2020-02-12 LAB — COMPREHENSIVE METABOLIC PANEL
ALT: 25 IU/L (ref 0–32)
AST: 25 IU/L (ref 0–40)
Albumin/Globulin Ratio: 1.4 (ref 1.2–2.2)
Albumin: 4.2 g/dL (ref 3.8–4.8)
Alkaline Phosphatase: 85 IU/L (ref 39–117)
BUN/Creatinine Ratio: 11 (ref 9–23)
BUN: 9 mg/dL (ref 6–24)
Bilirubin Total: <0.2 mg/dL (ref 0.0–1.2)
CO2: 23 mmol/L (ref 20–29)
Calcium: 9.2 mg/dL (ref 8.7–10.2)
Chloride: 99 mmol/L (ref 96–106)
Creatinine, Ser: 0.8 mg/dL (ref 0.57–1.00)
GFR calc Af Amer: 103 mL/min/{1.73_m2} (ref >59)
GFR calc non Af Amer: 89 mL/min/{1.73_m2} (ref >59)
Globulin, Total: 3 g/dL (ref 1.5–4.5)
Glucose: 117 mg/dL — ABNORMAL HIGH (ref 65–99)
Potassium: 4 mmol/L (ref 3.5–5.2)
Sodium: 137 mmol/L (ref 134–144)
Total Protein: 7.2 g/dL (ref 6.0–8.5)

## 2020-02-12 LAB — LIPID PANEL
Chol/HDL Ratio: 5 ratio — ABNORMAL HIGH (ref 0.0–4.4)
Cholesterol, Total: 207 mg/dL — ABNORMAL HIGH (ref 100–199)
HDL: 41 mg/dL (ref >39)
LDL Chol Calc (NIH): 130 mg/dL — ABNORMAL HIGH (ref 0–99)
Triglycerides: 199 mg/dL — ABNORMAL HIGH (ref 0–149)
VLDL Cholesterol Cal: 36 mg/dL (ref 5–40)

## 2020-02-12 LAB — VITAMIN B12: Vitamin B-12: 560 pg/mL (ref 232–1245)

## 2020-02-12 LAB — HEMOGLOBIN A1C
Est. average glucose Bld gHb Est-mCnc: 123 mg/dL
Hgb A1c MFr Bld: 5.9 % — ABNORMAL HIGH (ref 4.8–5.6)

## 2020-02-12 LAB — VITAMIN D 25 HYDROXY (VIT D DEFICIENCY, FRACTURES): Vit D, 25-Hydroxy: 43.6 ng/mL (ref 30.0–100.0)

## 2020-02-12 LAB — TSH: TSH: 0.973 u[IU]/mL (ref 0.450–4.500)

## 2020-03-12 ENCOUNTER — Other Ambulatory Visit: Payer: Self-pay | Admitting: Family Medicine

## 2020-03-26 ENCOUNTER — Other Ambulatory Visit: Payer: Self-pay | Admitting: Family Medicine

## 2020-03-26 DIAGNOSIS — M62838 Other muscle spasm: Secondary | ICD-10-CM

## 2020-03-26 DIAGNOSIS — R140 Abdominal distension (gaseous): Secondary | ICD-10-CM

## 2020-04-10 ENCOUNTER — Other Ambulatory Visit: Payer: Self-pay | Admitting: Family Medicine

## 2020-04-18 ENCOUNTER — Other Ambulatory Visit: Payer: Self-pay | Admitting: Family Medicine

## 2020-04-18 DIAGNOSIS — R7303 Prediabetes: Secondary | ICD-10-CM

## 2020-05-13 ENCOUNTER — Ambulatory Visit (INDEPENDENT_AMBULATORY_CARE_PROVIDER_SITE_OTHER): Payer: Medicaid Other | Admitting: Family Medicine

## 2020-05-13 ENCOUNTER — Other Ambulatory Visit: Payer: Self-pay

## 2020-05-13 VITALS — BP 115/62 | HR 97 | Temp 97.3°F | Ht 64.0 in | Wt 191.0 lb

## 2020-05-13 DIAGNOSIS — G8929 Other chronic pain: Secondary | ICD-10-CM

## 2020-05-13 DIAGNOSIS — R232 Flushing: Secondary | ICD-10-CM | POA: Diagnosis not present

## 2020-05-13 DIAGNOSIS — M25561 Pain in right knee: Secondary | ICD-10-CM | POA: Diagnosis not present

## 2020-05-13 DIAGNOSIS — M792 Neuralgia and neuritis, unspecified: Secondary | ICD-10-CM | POA: Diagnosis not present

## 2020-05-13 DIAGNOSIS — R7303 Prediabetes: Secondary | ICD-10-CM | POA: Diagnosis not present

## 2020-05-13 DIAGNOSIS — K219 Gastro-esophageal reflux disease without esophagitis: Secondary | ICD-10-CM

## 2020-05-13 DIAGNOSIS — R140 Abdominal distension (gaseous): Secondary | ICD-10-CM | POA: Diagnosis not present

## 2020-05-13 DIAGNOSIS — Z09 Encounter for follow-up examination after completed treatment for conditions other than malignant neoplasm: Secondary | ICD-10-CM

## 2020-05-13 DIAGNOSIS — M25562 Pain in left knee: Secondary | ICD-10-CM

## 2020-05-13 LAB — POCT URINALYSIS DIPSTICK
Bilirubin, UA: NEGATIVE
Blood, UA: NEGATIVE
Glucose, UA: NEGATIVE
Ketones, UA: NEGATIVE
Leukocytes, UA: NEGATIVE
Nitrite, UA: NEGATIVE
Protein, UA: NEGATIVE
Spec Grav, UA: 1.025 (ref 1.010–1.025)
Urobilinogen, UA: 0.2 E.U./dL
pH, UA: 7 (ref 5.0–8.0)

## 2020-05-13 LAB — POCT GLYCOSYLATED HEMOGLOBIN (HGB A1C)
HbA1c POC (<> result, manual entry): 6.1 % (ref 4.0–5.6)
HbA1c, POC (controlled diabetic range): 6.1 % (ref 0.0–7.0)
HbA1c, POC (prediabetic range): 6.1 % (ref 5.7–6.4)
Hemoglobin A1C: 6.1 % — AB (ref 4.0–5.6)

## 2020-05-13 LAB — GLUCOSE, POCT (MANUAL RESULT ENTRY): POC Glucose: 119 mg/dl — AB (ref 70–99)

## 2020-05-13 MED ORDER — PANTOPRAZOLE SODIUM 40 MG PO TBEC: 40.0000 mg | DELAYED_RELEASE_TABLET | Freq: Two times a day (BID) | ORAL | 11 refills | Status: DC | PRN

## 2020-05-13 MED ORDER — INDOMETHACIN 50 MG PO CAPS: 50.0000 mg | ORAL_CAPSULE | Freq: Two times a day (BID) | ORAL | 6 refills | Status: DC

## 2020-05-13 NOTE — Progress Notes (Signed)
[facility]   Established Patient Office Visit  Subjective:  Patient ID: Brianna Velez, female    DOB: 04-24-1976  Age: 44 y.o. MRN: [id number]  CC:  Chief Complaint  Patient presents with  . Follow-up    3 month follow up    HPI Brianna Velez is a 44 year old who presents for Follow Up today.   Past Medical History:  Diagnosis Date  . Anxiety and depression   . Fibroids   . History of 2019 novel coronavirus disease (COVID-19) 12/21/2019  . History of hiatal hernia   . Insomnia 02/11/2020  . Leg pain   . Migraine   . Sciatica    Current Status: Since her last office visit, she is doing well with no complaints. She denies fevers, chills, fatigue, recent infections, weight loss, and night sweats. She has not had any headaches, visual changes, dizziness, and falls. No chest pain, heart palpitations, cough and shortness of breath reported. She continues to have abdominal bloating, but symptoms have improved. Denies GI problems such as nausea, vomiting, diarrhea, and constipation. She has no reports of blood in stools, dysuria and hematuria. Her anxiety is mild today, as she continues to take anti-anxiety medications as daily as prescribed. She denies suicidal ideations, homicidal ideations, or auditory hallucinations. She is taking all medications as prescribed. She denies pain today.   Past Surgical History:  Procedure Laterality Date  . ABDOMINAL HYSTERECTOMY  09/29/2015   Procedure: HYSTERECTOMY ABDOMINAL;  Surgeon: Mora Bellman, MD;  Location: [facility];  Service: Gynecology;;  . BILATERAL SALPINGECTOMY Bilateral 09/29/2015   Procedure: BILATERAL SALPINGECTOMY;  Surgeon: Mora Bellman, MD;  Location: [facility];  Service: Gynecology;  Laterality: Bilateral;  . TUBAL LIGATION    . WISDOM TOOTH EXTRACTION      Family History  Problem Relation Age of Onset  . Diabetes Sister   . Heart disease Paternal Grandmother   . Hypertension  Father   . Dementia Mother   . Hypertension Mother     Social History   Socioeconomic History  . Marital status: Single    Spouse name: Not on file  . Number of children: Not on file  . Years of education: Not on file  . Highest education level: Not on file  Occupational History  . Not on file  Tobacco Use  . Smoking status: Former Smoker    Quit date: 12/05/1996    Years since quitting: 23.4  . Smokeless tobacco: Never Used  Vaping Use  . Vaping Use: Never used  Substance and Sexual Activity  . Alcohol use: No  . Drug use: No  . Sexual activity: Not Currently    Birth control/protection: Surgical  Other Topics Concern  . Not on file  Social History Narrative  . Not on file   Social Determinants of Health   Financial Resource Strain:   . Difficulty of Paying Living Expenses:   Food Insecurity:   . Worried About Charity fundraiser in the Last Year:   . Arboriculturist in the Last Year:   Transportation Needs:   . Film/video editor (Medical):   Marland Kitchen Lack of Transportation (Non-Medical):   Physical Activity:   . Days of Exercise per Week:   . Minutes of Exercise per Session:   Stress:   . Feeling of Stress :   Social Connections:   . Frequency of Communication with Friends and Family:   . Frequency of Social  Gatherings with Friends and Family:   . Attends Religious Services:   . Active Member of Clubs or Organizations:   . Attends Archivist Meetings:   Marland Kitchen Marital Status:   Intimate Partner Violence:   . Fear of Current or Ex-Partner:   . Emotionally Abused:   Marland Kitchen Physically Abused:   . Sexually Abused:     Outpatient Medications Prior to Visit  Medication Sig Dispense Refill  . busPIRone (BUSPAR) 10 MG tablet TAKE 1 TABLET(10 MG) BY MOUTH THREE TIMES DAILY 60 tablet 6  . cyclobenzaprine (FLEXERIL) 10 MG tablet TAKE 1 TABLET(10 MG) BY MOUTH THREE TIMES DAILY AS NEEDED FOR MUSCLE SPASMS 30 tablet 3  . DULoxetine (CYMBALTA) 20 MG capsule Take 1  capsule (20 mg total) by mouth daily. 90 capsule 2  . furosemide (LASIX) 40 MG tablet TAKE 1 TABLET(40 MG) BY MOUTH TWICE DAILY 60 tablet 3  . gabapentin (NEURONTIN) 600 MG tablet Take 1 tablet (600 mg total) by mouth 3 (three) times daily. 270 tablet 2  . meloxicam (MOBIC) 15 MG tablet Take 1 tablet (15 mg total) by mouth daily. 60 tablet 6  . metFORMIN (GLUCOPHAGE) 500 MG tablet TAKE 1 TABLET(500 MG) BY MOUTH TWICE DAILY WITH A MEAL 60 tablet 3  . Multiple Vitamins-Minerals (ONE-A-DAY WOMENS) tablet Take 1 tablet by mouth daily.    . indomethacin (INDOCIN) 50 MG capsule Take 1 capsule (50 mg total) by mouth 2 (two) times daily with a meal. 10 capsule 3  . pantoprazole (PROTONIX) 20 MG tablet TAKE 1 TABLET(20 MG) BY MOUTH DAILY 90 tablet 2  . Vitamin D, Ergocalciferol, (DRISDOL) 1.25 MG (50000 UT) CAPS capsule TAKE 1 CAPSULE BY MOUTH EVERY 7 DAYS 5 capsule 3   No facility-administered medications prior to visit.    Allergies  Allergen Reactions  . Prednisone     Insomnia "it has me up all night"  . Hydrocodone Rash    ROS Review of Systems  Constitutional: Negative.   HENT: Negative.   Eyes: Negative.   Respiratory: Negative.   Cardiovascular: Negative.   Gastrointestinal: Positive for abdominal distention (bloating).  Endocrine: Negative.   Genitourinary: Negative.   Musculoskeletal: Positive for arthralgias (generalized joint pain).  Allergic/Immunologic: Negative.   Neurological: Positive for numbness (peripheral neuropathy).       Hot flashes  Hematological: Negative.   Psychiatric/Behavioral: Negative.       Objective:    Physical Exam Vitals and nursing note reviewed.  Constitutional:      Appearance: She is obese.  HENT:     Head: Normocephalic and atraumatic.  Cardiovascular:     Rate and Rhythm: Normal rate and regular rhythm.  Pulmonary:     Effort: Pulmonary effort is normal.     Breath sounds: Normal breath sounds.  Abdominal:     General: Bowel  sounds are normal.  Musculoskeletal:        General: Normal range of motion.     Cervical back: Normal range of motion and neck supple.  Skin:    General: Skin is warm and dry.     Capillary Refill: Capillary refill takes less than 2 seconds.  Neurological:     Mental Status: She is alert.  Psychiatric:        Mood and Affect: Mood normal.        Behavior: Behavior normal.        Thought Content: Thought content normal.        Judgment: Judgment normal.  BP 115/62 (BP Location: Left Arm, Patient Position: Sitting, Cuff Size: Small)   Pulse 97   Temp (!) 97.3 F (36.3 C)   Ht 5\' 4"  (1.626 m)   Wt 191 lb 0.4 oz (86.6 kg)   LMP 08/14/2016   SpO2 95%   BMI 32.79 kg/m  Wt Readings from Last 3 Encounters:  05/13/20 191 lb 0.4 oz (86.6 kg)  02/11/20 191 lb (86.6 kg)  08/14/19 178 lb 9.6 oz (81 kg)     Health Maintenance Due  Topic Date Due  . Hepatitis C Screening  Never done  . COVID-19 Vaccine (1) Never done  . PAP SMEAR-Modifier  04/30/2017    There are no preventive care reminders to display for this patient.  Lab Results  Component Value Date   TSH 0.973 02/11/2020   Lab Results  Component Value Date   WBC 4.4 02/11/2020   HGB 13.7 02/11/2020   HCT 40.5 02/11/2020   MCV 90 02/11/2020   PLT 181 02/11/2020   Lab Results  Component Value Date   NA 137 02/11/2020   K 4.0 02/11/2020   CO2 23 02/11/2020   GLUCOSE 117 (H) 02/11/2020   BUN 9 02/11/2020   CREATININE 0.80 02/11/2020   BILITOT <0.2 02/11/2020   ALKPHOS 85 02/11/2020   AST 25 02/11/2020   ALT 25 02/11/2020   PROT 7.2 02/11/2020   ALBUMIN 4.2 02/11/2020   CALCIUM 9.2 02/11/2020   ANIONGAP 11 04/01/2019   Lab Results  Component Value Date   CHOL 207 (H) 02/11/2020   Lab Results  Component Value Date   HDL 41 02/11/2020   Lab Results  Component Value Date   LDLCALC 130 (H) 02/11/2020   Lab Results  Component Value Date   TRIG 199 (H) 02/11/2020   Lab Results  Component Value  Date   CHOLHDL 5.0 (H) 02/11/2020   Lab Results  Component Value Date   HGBA1C 6.1 (A) 05/13/2020   HGBA1C 6.1 05/13/2020   HGBA1C 6.1 05/13/2020   HGBA1C 6.1 05/13/2020   Assessment & Plan:   1. Pre-diabetes - Urinalysis Dipstick - Glucose (CBG) - HgB A1c  2. Gastroesophageal reflux disease without esophagitis - pantoprazole (PROTONIX) 40 MG tablet; Take 1 tablet (40 mg total) by mouth 2 (two) times daily as needed.  Dispense: 60 tablet; Refill: 11  3. Chronic pain of both knees - indomethacin (INDOCIN) 50 MG capsule; Take 1 capsule (50 mg total) by mouth 2 (two) times daily with a meal.  Dispense: 60 capsule; Refill: 6  4. Hot flashes Stable. Not worsening.   5. Abdominal distension (gaseous)  6. Neuropathic pain Continue Gabapentin as prescribed.   7. Follow up She will follow up in 6 months.   Meds ordered this encounter  Medications  . pantoprazole (PROTONIX) 40 MG tablet    Sig: Take 1 tablet (40 mg total) by mouth 2 (two) times daily as needed.    Dispense:  60 tablet    Refill:  11  . indomethacin (INDOCIN) 50 MG capsule    Sig: Take 1 capsule (50 mg total) by mouth 2 (two) times daily with a meal.    Dispense:  60 capsule    Refill:  6    Orders Placed This Encounter  Procedures  . Urinalysis Dipstick  . Glucose (CBG)  . HgB A1c    Referral Orders  No referral(s) requested today    Kathe Becton,  MSN, FNP-BC [place]  [place], [place] [address] [phone] fax   Problem List Items Addressed This Visit      Other   Abdominal distension (gaseous)   Neuropathic pain    Other Visit Diagnoses    Pre-diabetes    -  Primary   Relevant Orders   Urinalysis Dipstick (Completed)   Glucose (CBG) (Completed)   HgB A1c (Completed)   Gastroesophageal reflux disease without esophagitis       Relevant Medications   pantoprazole (PROTONIX) 40 MG  tablet   Chronic pain of both knees       Relevant Medications   indomethacin (INDOCIN) 50 MG capsule   Hot flashes       Follow up          Meds ordered this encounter  Medications  . pantoprazole (PROTONIX) 40 MG tablet    Sig: Take 1 tablet (40 mg total) by mouth 2 (two) times daily as needed.    Dispense:  60 tablet    Refill:  11  . indomethacin (INDOCIN) 50 MG capsule    Sig: Take 1 capsule (50 mg total) by mouth 2 (two) times daily with a meal.    Dispense:  60 capsule    Refill:  6    Follow-up: No follow-ups on file.    Azzie Glatter, FNP

## 2020-05-14 DIAGNOSIS — R232 Flushing: Secondary | ICD-10-CM | POA: Insufficient documentation

## 2020-05-14 DIAGNOSIS — K219 Gastro-esophageal reflux disease without esophagitis: Secondary | ICD-10-CM | POA: Insufficient documentation

## 2020-05-14 DIAGNOSIS — G8929 Other chronic pain: Secondary | ICD-10-CM | POA: Insufficient documentation

## 2020-05-14 DIAGNOSIS — M25562 Pain in left knee: Secondary | ICD-10-CM | POA: Insufficient documentation

## 2020-05-16 ENCOUNTER — Other Ambulatory Visit: Payer: Self-pay | Admitting: Family Medicine

## 2020-05-16 DIAGNOSIS — R232 Flushing: Secondary | ICD-10-CM

## 2020-05-16 DIAGNOSIS — M792 Neuralgia and neuritis, unspecified: Secondary | ICD-10-CM

## 2020-05-18 NOTE — Telephone Encounter (Signed)
Please advise if you want to send in refills. This was last filled 08/14/19 Qty: 270 with #2 RFs. Last OV 05/13/2020. Does have a pending appt 11/14/19

## 2020-05-21 ENCOUNTER — Other Ambulatory Visit: Payer: Self-pay | Admitting: Family Medicine

## 2020-05-21 DIAGNOSIS — F419 Anxiety disorder, unspecified: Secondary | ICD-10-CM

## 2020-05-21 DIAGNOSIS — R232 Flushing: Secondary | ICD-10-CM

## 2020-05-21 DIAGNOSIS — M792 Neuralgia and neuritis, unspecified: Secondary | ICD-10-CM

## 2020-05-21 DIAGNOSIS — F32A Depression, unspecified: Secondary | ICD-10-CM

## 2020-07-06 ENCOUNTER — Other Ambulatory Visit: Payer: Self-pay | Admitting: Family Medicine

## 2020-07-06 DIAGNOSIS — M62838 Other muscle spasm: Secondary | ICD-10-CM

## 2020-10-07 ENCOUNTER — Other Ambulatory Visit: Payer: Self-pay | Admitting: Family Medicine

## 2020-10-07 DIAGNOSIS — M62838 Other muscle spasm: Secondary | ICD-10-CM

## 2020-10-12 ENCOUNTER — Telehealth: Payer: Self-pay | Admitting: Family Medicine

## 2020-10-12 ENCOUNTER — Other Ambulatory Visit: Payer: Self-pay | Admitting: Family Medicine

## 2020-10-12 ENCOUNTER — Encounter: Payer: Self-pay | Admitting: Family Medicine

## 2020-10-12 DIAGNOSIS — M62838 Other muscle spasm: Secondary | ICD-10-CM

## 2020-10-12 MED ORDER — CYCLOBENZAPRINE HCL 10 MG PO TABS: ORAL_TABLET | ORAL | 3 refills | Status: DC

## 2020-10-13 NOTE — Telephone Encounter (Signed)
Sent to provider 

## 2020-10-31 ENCOUNTER — Other Ambulatory Visit: Payer: Self-pay | Admitting: Family Medicine

## 2020-11-12 NOTE — Telephone Encounter (Signed)
Pt request refill on buspar.

## 2020-11-13 ENCOUNTER — Ambulatory Visit: Payer: Medicaid Other | Admitting: Family Medicine

## 2020-12-19 ENCOUNTER — Other Ambulatory Visit: Payer: Self-pay | Admitting: Family Medicine

## 2020-12-19 DIAGNOSIS — R7303 Prediabetes: Secondary | ICD-10-CM

## 2020-12-21 NOTE — Telephone Encounter (Signed)
Please see patient request.

## 2020-12-25 ENCOUNTER — Ambulatory Visit (HOSPITAL_COMMUNITY)
Admission: EM | Admit: 2020-12-25 | Discharge: 2020-12-25 | Disposition: A | Discharge status: Home / Self Care | Payer: Medicaid Other | Attending: Emergency Medicine | Admitting: Emergency Medicine

## 2020-12-25 ENCOUNTER — Encounter (HOSPITAL_COMMUNITY): Payer: Self-pay

## 2020-12-25 ENCOUNTER — Other Ambulatory Visit: Payer: Self-pay

## 2020-12-25 DIAGNOSIS — H60501 Unspecified acute noninfective otitis externa, right ear: Secondary | ICD-10-CM

## 2020-12-25 MED ORDER — CIPROFLOXACIN-DEXAMETHASONE 0.3-0.1 % OT SUSP: 4.0000 [drp] | Freq: Two times a day (BID) | OTIC | 0 refills | Status: AC

## 2020-12-25 NOTE — ED Triage Notes (Signed)
Pt in with c/o right ear pain that she woke up with today  Pt tried ibuprofen for relief

## 2020-12-25 NOTE — Discharge Instructions (Signed)
Use of ear drops as provided.  Please return if no improving or if worsening in the next week.  Avoid submerging under water for the next week.

## 2020-12-25 NOTE — ED Provider Notes (Signed)
[facility]    CSN: [id number] Arrival date & time: 12/25/20  1111      History   Chief Complaint Chief Complaint  Patient presents with  . Otalgia    HPI Brianna Velez is a 45 y.o. female.   Brianna Velez presents with complaints of right ear pain she woke with this morning. No known drainage. Decreased hearing, feels full. No other URI symptoms. No fevers. Denies any previous similar. Hasn't tried any medications for symptoms.    ROS per HPI, negative if not otherwise mentioned.      Past Medical History:  Diagnosis Date  . Anxiety and depression   . Fibroids   . History of 2019 novel coronavirus disease (COVID-19) 12/21/2019  . History of hiatal hernia   . Insomnia 02/11/2020  . Leg pain   . Migraine   . Sciatica     Patient Active Problem List   Diagnosis Date Noted  . Hot flashes 05/14/2020  . Gastroesophageal reflux disease without esophagitis 05/14/2020  . Chronic pain of both knees 05/14/2020  . Chronic left-sided low back pain without sciatica 08/14/2019  . Chronic abdominal pain 08/14/2019  . Vitamin D deficiency 08/14/2019  . Postmenopausal vaginal bleeding 06/05/2019  . Pneumonia of left lower lobe due to infectious organism 06/05/2019  . Cough 06/05/2019  . Generalized abdominal pain 05/21/2019  . Neuropathic pain 05/08/2019  . Anxiety and depression 05/08/2019  . Assistance needed with transportation 05/08/2019  . Peripheral edema 04/18/2019  . Abdominal distension (gaseous) 04/18/2019  . Flatulence/gas pain/belching 04/18/2019  . Intractable migraine without status migrainosus 04/18/2019  . Chronic pain syndrome 03/31/2016  . Screening for diabetes mellitus 12/31/2015  . S/P TAH (total abdominal hysterectomy) 09/29/2015  . Fibroid, uterine   . Menorrhagia with regular cycle   . Fibroid uterus 07/01/2015  . Left hip pain 11/06/2014    Past Surgical History:  Procedure Laterality Date  . ABDOMINAL HYSTERECTOMY  09/29/2015    Procedure: HYSTERECTOMY ABDOMINAL;  Surgeon: Mora Bellman, MD;  Location: [facility];  Service: Gynecology;;  . BILATERAL SALPINGECTOMY Bilateral 09/29/2015   Procedure: BILATERAL SALPINGECTOMY;  Surgeon: Mora Bellman, MD;  Location: [facility];  Service: Gynecology;  Laterality: Bilateral;  . TUBAL LIGATION    . WISDOM TOOTH EXTRACTION      OB History    Gravida  2   Para  2   Term  2   Preterm  0   AB  0   Living  2     SAB  0   IAB  0   Ectopic  0   Multiple  0   Live Births               Home Medications    Prior to Admission medications   Medication Sig Start Date End Date Taking? Authorizing Provider  ciprofloxacin-dexamethasone (CIPRODEX) OTIC suspension Place 4 drops into the right ear 2 (two) times daily for 7 days. 12/25/20 01/01/21 Yes Burky, Lanelle Bal B, NP  busPIRone (BUSPAR) 10 MG tablet TAKE 1 TABLET(10 MG) BY MOUTH THREE TIMES DAILY 11/12/20   Azzie Glatter, FNP  cyclobenzaprine (FLEXERIL) 10 MG tablet Take 1 tablet, by mouth, 3 times a day as needed. 10/12/20   Azzie Glatter, FNP  DULoxetine (CYMBALTA) 20 MG capsule TAKE 1 CAPSULE(20 MG) BY MOUTH DAILY 05/23/20   Azzie Glatter, FNP  furosemide (LASIX) 40 MG tablet TAKE 1 TABLET(40 MG) BY MOUTH TWICE DAILY 04/10/20   Azzie Glatter,  FNP  gabapentin (NEURONTIN) 600 MG tablet TAKE 1 TABLET(600 MG) BY MOUTH THREE TIMES DAILY 05/19/20   Azzie Glatter, FNP  indomethacin (INDOCIN) 50 MG capsule Take 1 capsule (50 mg total) by mouth 2 (two) times daily with a meal. 05/13/20   Azzie Glatter, FNP  meloxicam (MOBIC) 15 MG tablet Take 1 tablet (15 mg total) by mouth daily. 02/11/20   Azzie Glatter, FNP  metFORMIN (GLUCOPHAGE) 500 MG tablet TAKE 1 TABLET(500 MG) BY MOUTH TWICE DAILY WITH A MEAL 12/21/20   Azzie Glatter, FNP  Multiple Vitamins-Minerals (ONE-A-DAY WOMENS) tablet Take 1 tablet by mouth daily.    [provider]  pantoprazole (PROTONIX) 40 MG tablet Take 1 tablet (40 mg  total) by mouth 2 (two) times daily as needed. 05/13/20   Azzie Glatter, FNP    Family History Family History  Problem Relation Age of Onset  . Diabetes Sister   . Heart disease Paternal Grandmother   . Hypertension Father   . Dementia Mother   . Hypertension Mother     Social History Social History   Tobacco Use  . Smoking status: Former Smoker    Quit date: 12/05/1996    Years since quitting: 24.0  . Smokeless tobacco: Never Used  Vaping Use  . Vaping Use: Never used  Substance Use Topics  . Alcohol use: No  . Drug use: No     Allergies   Prednisone and Hydrocodone   Review of Systems Review of Systems   Physical Exam Triage Vital Signs ED Triage Vitals  Enc Vitals Group     BP 12/25/20 1256 122/88     Pulse Rate 12/25/20 1256 (!) 104     Resp 12/25/20 1256 19     Temp 12/25/20 1256 97.9 F (36.6 C)     Temp src --      SpO2 12/25/20 1256 99 %     Weight --      Height --      Head Circumference --      Peak Flow --      Pain Score 12/25/20 1254 8     Pain Loc --      Pain Edu? --      Excl. in [place]? --    No data found.  Updated Vital Signs BP 122/88   Pulse (!) 104   Temp 97.9 F (36.6 C)   Resp 19   LMP 08/14/2016   SpO2 99%   Visual Acuity Right Eye Distance:   Left Eye Distance:   Bilateral Distance:    Right Eye Near:   Left Eye Near:    Bilateral Near:     Physical Exam Constitutional:      General: She is not in acute distress.    Appearance: She is well-developed.  HENT:     Right Ear: Drainage, swelling and tenderness present.     Ears:     Comments: Right ear canal is tender with swelling and discharge noted; tm is partially occluded but aspect which is visible appears WNL Cardiovascular:     Rate and Rhythm: Normal rate.  Pulmonary:     Effort: Pulmonary effort is normal.  Skin:    General: Skin is warm and dry.  Neurological:     Mental Status: She is alert and oriented to person, place, and time.      UC  Treatments / Results  Labs (all labs ordered are listed, but only abnormal results  are displayed) Labs Reviewed - No data to display  EKG   Radiology No results found.  Procedures Procedures (including critical care time)  Medications Ordered in UC Medications - No data to display  Initial Impression / Assessment and Plan / UC Course  I have reviewed the triage vital signs and the nursing notes.  Pertinent labs & imaging results that were available during my care of the patient were reviewed by me and considered in my medical decision making (see chart for details).     AOE on exam with ear drops provided. Return precautions provided. Patient verbalized understanding and agreeable to plan.   Final Clinical Impressions(s) / UC Diagnoses   Final diagnoses:  Acute otitis externa of right ear, unspecified type     Discharge Instructions     Use of ear drops as provided.  Please return if no improving or if worsening in the next week.  Avoid submerging under water for the next week.    ED Prescriptions    Medication Sig Dispense Auth. Provider   ciprofloxacin-dexamethasone (CIPRODEX) OTIC suspension Place 4 drops into the right ear 2 (two) times daily for 7 days. 7.5 mL Zigmund Gottron, NP     PDMP not reviewed this encounter.   Zigmund Gottron, NP 12/25/20 1317

## 2020-12-27 ENCOUNTER — Other Ambulatory Visit: Payer: Self-pay | Admitting: Family Medicine

## 2020-12-27 DIAGNOSIS — E559 Vitamin D deficiency, unspecified: Secondary | ICD-10-CM

## 2020-12-28 NOTE — Telephone Encounter (Signed)
Please see patient refill reqeust

## 2020-12-30 ENCOUNTER — Encounter: Payer: Self-pay | Admitting: Family Medicine

## 2020-12-30 ENCOUNTER — Other Ambulatory Visit: Payer: Self-pay | Admitting: Family Medicine

## 2020-12-30 ENCOUNTER — Other Ambulatory Visit: Payer: Self-pay

## 2020-12-30 DIAGNOSIS — F32A Depression, unspecified: Secondary | ICD-10-CM

## 2020-12-30 DIAGNOSIS — M792 Neuralgia and neuritis, unspecified: Secondary | ICD-10-CM

## 2020-12-30 DIAGNOSIS — R232 Flushing: Secondary | ICD-10-CM

## 2020-12-30 DIAGNOSIS — F419 Anxiety disorder, unspecified: Secondary | ICD-10-CM

## 2020-12-30 MED ORDER — DULOXETINE HCL 20 MG PO CPEP: 20.0000 mg | ORAL_CAPSULE | Freq: Every day | ORAL | 3 refills | Status: DC

## 2020-12-30 NOTE — Telephone Encounter (Signed)
Pt is requesting a refill on this medication is this okay to refill

## 2020-12-31 ENCOUNTER — Encounter (HOSPITAL_COMMUNITY): Payer: Self-pay

## 2020-12-31 ENCOUNTER — Ambulatory Visit (HOSPITAL_COMMUNITY)
Admission: EM | Admit: 2020-12-31 | Discharge: 2020-12-31 | Disposition: A | Discharge status: Home / Self Care | Payer: Medicaid Other | Attending: Emergency Medicine | Admitting: Emergency Medicine

## 2020-12-31 DIAGNOSIS — H66001 Acute suppurative otitis media without spontaneous rupture of ear drum, right ear: Secondary | ICD-10-CM | POA: Diagnosis not present

## 2020-12-31 MED ORDER — AMOXICILLIN-POT CLAVULANATE 875-125 MG PO TABS: 1.0000 | ORAL_TABLET | Freq: Two times a day (BID) | ORAL | 0 refills | Status: DC

## 2020-12-31 NOTE — ED Triage Notes (Signed)
Patient presents to Urgent Care with complaints of of right ear pain that she was treated for on 01/21. Pt states she was seen on 01/21 and prescribed ear drops reports the medication has not provided relief. Here for a follow-up.   Denies fever.

## 2020-12-31 NOTE — ED Provider Notes (Signed)
[facility]    CSN: [id number] Arrival date & time: 12/31/20  0931      History   Chief Complaint Chief Complaint  Patient presents with  . Follow-up    Right ear infection     HPI Brianna Velez is a 45 y.o. female.   Brianna Velez presents with complaints of persistent right ear pain. She was seen here 1/21 and provided with cipro dex ear drops for AOE to right ear. Pain has worsened. Some decreased hearing. No drainage from the ear. No known fevers. No other congestion. No left ear pain.    ROS per HPI, negative if not otherwise mentioned.      Past Medical History:  Diagnosis Date  . Anxiety and depression   . Fibroids   . History of 2019 novel coronavirus disease (COVID-19) 12/21/2019  . History of hiatal hernia   . Insomnia 02/11/2020  . Leg pain   . Migraine   . Sciatica     Patient Active Problem List   Diagnosis Date Noted  . Hot flashes 05/14/2020  . Gastroesophageal reflux disease without esophagitis 05/14/2020  . Chronic pain of both knees 05/14/2020  . Chronic left-sided low back pain without sciatica 08/14/2019  . Chronic abdominal pain 08/14/2019  . Vitamin D deficiency 08/14/2019  . Postmenopausal vaginal bleeding 06/05/2019  . Pneumonia of left lower lobe due to infectious organism 06/05/2019  . Cough 06/05/2019  . Generalized abdominal pain 05/21/2019  . Neuropathic pain 05/08/2019  . Anxiety and depression 05/08/2019  . Assistance needed with transportation 05/08/2019  . Peripheral edema 04/18/2019  . Abdominal distension (gaseous) 04/18/2019  . Flatulence/gas pain/belching 04/18/2019  . Intractable migraine without status migrainosus 04/18/2019  . Chronic pain syndrome 03/31/2016  . Screening for diabetes mellitus 12/31/2015  . S/P TAH (total abdominal hysterectomy) 09/29/2015  . Fibroid, uterine   . Menorrhagia with regular cycle   . Fibroid uterus 07/01/2015  . Left hip pain 11/06/2014    Past Surgical History:   Procedure Laterality Date  . ABDOMINAL HYSTERECTOMY  09/29/2015   Procedure: HYSTERECTOMY ABDOMINAL;  Surgeon: Mora Bellman, MD;  Location: [facility];  Service: Gynecology;;  . BILATERAL SALPINGECTOMY Bilateral 09/29/2015   Procedure: BILATERAL SALPINGECTOMY;  Surgeon: Mora Bellman, MD;  Location: [facility];  Service: Gynecology;  Laterality: Bilateral;  . TUBAL LIGATION    . WISDOM TOOTH EXTRACTION      OB History    Gravida  2   Para  2   Term  2   Preterm  0   AB  0   Living  2     SAB  0   IAB  0   Ectopic  0   Multiple  0   Live Births               Home Medications    Prior to Admission medications   Medication Sig Start Date End Date Taking? Authorizing Provider  amoxicillin-clavulanate (AUGMENTIN) 875-125 MG tablet Take 1 tablet by mouth every 12 (twelve) hours. 12/31/20  Yes Burky, Lanelle Bal B, NP  busPIRone (BUSPAR) 10 MG tablet TAKE 1 TABLET(10 MG) BY MOUTH THREE TIMES DAILY 11/12/20   Azzie Glatter, FNP  ciprofloxacin-dexamethasone (CIPRODEX) OTIC suspension Place 4 drops into the right ear 2 (two) times daily for 7 days. 12/25/20 01/01/21  Zigmund Gottron, NP  cyclobenzaprine (FLEXERIL) 10 MG tablet Take 1 tablet, by mouth, 3 times a day as needed. 10/12/20   Azzie Glatter, FNP  DULoxetine (CYMBALTA) 20 MG capsule Take 1 capsule (20 mg total) by mouth daily. 12/30/20   Azzie Glatter, FNP  furosemide (LASIX) 40 MG tablet TAKE 1 TABLET(40 MG) BY MOUTH TWICE DAILY 04/10/20   Azzie Glatter, FNP  gabapentin (NEURONTIN) 600 MG tablet TAKE 1 TABLET(600 MG) BY MOUTH THREE TIMES DAILY 05/19/20   Azzie Glatter, FNP  indomethacin (INDOCIN) 50 MG capsule Take 1 capsule (50 mg total) by mouth 2 (two) times daily with a meal. 05/13/20   Azzie Glatter, FNP  meloxicam (MOBIC) 15 MG tablet Take 1 tablet (15 mg total) by mouth daily. 02/11/20   Azzie Glatter, FNP  metFORMIN (GLUCOPHAGE) 500 MG tablet TAKE 1 TABLET(500 MG) BY MOUTH TWICE DAILY WITH A MEAL  12/21/20   Azzie Glatter, FNP  Multiple Vitamins-Minerals (ONE-A-DAY WOMENS) tablet Take 1 tablet by mouth daily.    [provider]  pantoprazole (PROTONIX) 40 MG tablet Take 1 tablet (40 mg total) by mouth 2 (two) times daily as needed. 05/13/20   Azzie Glatter, FNP    Family History Family History  Problem Relation Age of Onset  . Diabetes Sister   . Heart disease Paternal Grandmother   . Hypertension Father   . Dementia Mother   . Hypertension Mother     Social History Social History   Tobacco Use  . Smoking status: Former Smoker    Quit date: 12/05/1996    Years since quitting: 24.0  . Smokeless tobacco: Never Used  Vaping Use  . Vaping Use: Never used  Substance Use Topics  . Alcohol use: No  . Drug use: No     Allergies   Prednisone and Hydrocodone   Review of Systems Review of Systems   Physical Exam Triage Vital Signs ED Triage Vitals  Enc Vitals Group     BP 12/31/20 0941 118/84     Pulse Rate 12/31/20 0941 (!) 111     Resp 12/31/20 0941 18     Temp 12/31/20 0941 98.7 F (37.1 C)     Temp Source 12/31/20 0941 Oral     SpO2 12/31/20 0941 98 %     Weight --      Height --      Head Circumference --      Peak Flow --      Pain Score 12/31/20 0939 10     Pain Loc --      Pain Edu? --      Excl. in [place]? --    No data found.  Updated Vital Signs BP 118/84 (BP Location: Right Arm)   Pulse (!) 111   Temp 98.7 F (37.1 C) (Oral)   Resp 18   LMP 08/14/2016   SpO2 98%   Visual Acuity Right Eye Distance:   Left Eye Distance:   Bilateral Distance:    Right Eye Near:   Left Eye Near:    Bilateral Near:     Physical Exam Constitutional:      General: She is not in acute distress.    Appearance: She is well-developed.  HENT:     Right Ear: Tenderness present. Tympanic membrane is erythematous and bulging.     Left Ear: A middle ear effusion is present.     Ears:     Comments: Right canal still with some swelling and discharge  noted; irrigated per nursing staff; visible TM is red and bulging ; left tm is opaque Cardiovascular:  Rate and Rhythm: Normal rate.  Pulmonary:     Effort: Pulmonary effort is normal.  Skin:    General: Skin is warm and dry.  Neurological:     Mental Status: She is alert and oriented to person, place, and time.      UC Treatments / Results  Labs (all labs ordered are listed, but only abnormal results are displayed) Labs Reviewed - No data to display  EKG   Radiology No results found.  Procedures Procedures (including critical care time)  Medications Ordered in UC Medications - No data to display  Initial Impression / Assessment and Plan / UC Course  I have reviewed the triage vital signs and the nursing notes.  Pertinent labs & imaging results that were available during my care of the patient were reviewed by me and considered in my medical decision making (see chart for details).     S/p ear lavage per nursing staff allows visualization of red and bulging right tm. Oral antibiotics provided at this time. Return precautions provided. Patient verbalized understanding and agreeable to plan.   Final Clinical Impressions(s) / UC Diagnoses   Final diagnoses:  Acute suppurative otitis media of right ear without spontaneous rupture of tympanic membrane, recurrence not specified     Discharge Instructions     Another 7 days of drops.  Complete course of antibiotics.     ED Prescriptions    Medication Sig Dispense Auth. Provider   amoxicillin-clavulanate (AUGMENTIN) 875-125 MG tablet Take 1 tablet by mouth every 12 (twelve) hours. 14 tablet Zigmund Gottron, NP     PDMP not reviewed this encounter.   Zigmund Gottron, NP 12/31/20 1051

## 2020-12-31 NOTE — Discharge Instructions (Signed)
Another 7 days of drops.  Complete course of antibiotics.

## 2021-01-02 ENCOUNTER — Other Ambulatory Visit: Payer: Self-pay | Admitting: Family Medicine

## 2021-01-02 DIAGNOSIS — M545 Low back pain, unspecified: Secondary | ICD-10-CM

## 2021-01-02 DIAGNOSIS — G8929 Other chronic pain: Secondary | ICD-10-CM

## 2021-01-02 DIAGNOSIS — G894 Chronic pain syndrome: Secondary | ICD-10-CM

## 2021-01-04 ENCOUNTER — Ambulatory Visit: Payer: Medicaid Other | Admitting: Family Medicine

## 2021-01-04 NOTE — Telephone Encounter (Signed)
Is this okay to refill? 

## 2021-01-05 ENCOUNTER — Other Ambulatory Visit: Payer: Self-pay | Admitting: Family Medicine

## 2021-01-05 DIAGNOSIS — R140 Abdominal distension (gaseous): Secondary | ICD-10-CM

## 2021-01-05 DIAGNOSIS — K219 Gastro-esophageal reflux disease without esophagitis: Secondary | ICD-10-CM

## 2021-01-05 MED ORDER — PANTOPRAZOLE SODIUM 40 MG PO TBEC: 40.0000 mg | DELAYED_RELEASE_TABLET | Freq: Two times a day (BID) | ORAL | 0 refills | Status: DC | PRN

## 2021-01-05 NOTE — Telephone Encounter (Signed)
Please see refill request.

## 2021-01-14 ENCOUNTER — Emergency Department (HOSPITAL_COMMUNITY)
Admission: EM | Admit: 2021-01-14 | Discharge: 2021-01-14 | Disposition: A | Discharge status: Home / Self Care | Payer: Medicaid Other | Attending: Emergency Medicine | Admitting: Emergency Medicine

## 2021-01-14 ENCOUNTER — Emergency Department (HOSPITAL_COMMUNITY): Payer: Medicaid Other

## 2021-01-14 ENCOUNTER — Other Ambulatory Visit: Payer: Self-pay

## 2021-01-14 ENCOUNTER — Encounter (HOSPITAL_COMMUNITY): Payer: Self-pay | Admitting: Emergency Medicine

## 2021-01-14 DIAGNOSIS — H7091 Unspecified mastoiditis, right ear: Secondary | ICD-10-CM | POA: Diagnosis not present

## 2021-01-14 DIAGNOSIS — H9201 Otalgia, right ear: Secondary | ICD-10-CM | POA: Diagnosis not present

## 2021-01-14 DIAGNOSIS — Z8616 Personal history of COVID-19: Secondary | ICD-10-CM | POA: Insufficient documentation

## 2021-01-14 DIAGNOSIS — H748X1 Other specified disorders of right middle ear and mastoid: Secondary | ICD-10-CM | POA: Diagnosis not present

## 2021-01-14 DIAGNOSIS — Z87891 Personal history of nicotine dependence: Secondary | ICD-10-CM | POA: Diagnosis not present

## 2021-01-14 DIAGNOSIS — H6091 Unspecified otitis externa, right ear: Secondary | ICD-10-CM | POA: Diagnosis not present

## 2021-01-14 LAB — COMPREHENSIVE METABOLIC PANEL
ALT: 40 U/L (ref 0–44)
AST: 28 U/L (ref 15–41)
Albumin: 3.8 g/dL (ref 3.5–5.0)
Alkaline Phosphatase: 84 U/L (ref 38–126)
Anion gap: 10 (ref 5–15)
BUN: 9 mg/dL (ref 6–20)
CO2: 31 mmol/L (ref 22–32)
Calcium: 9.6 mg/dL (ref 8.9–10.3)
Chloride: 98 mmol/L (ref 98–111)
Creatinine, Ser: 0.76 mg/dL (ref 0.44–1.00)
GFR, Estimated: >60 mL/min (ref >60)
Glucose, Bld: 140 mg/dL — ABNORMAL HIGH (ref 70–99)
Potassium: 4.4 mmol/L (ref 3.5–5.1)
Sodium: 139 mmol/L (ref 135–145)
Total Bilirubin: 0.3 mg/dL (ref 0.3–1.2)
Total Protein: 7.9 g/dL (ref 6.5–8.1)

## 2021-01-14 LAB — CBC WITH DIFFERENTIAL/PLATELET
Abs Immature Granulocytes: 0.02 10*3/uL (ref 0.00–0.07)
Basophils Absolute: 0 10*3/uL (ref 0.0–0.1)
Basophils Relative: 0 %
Eosinophils Absolute: 0.1 10*3/uL (ref 0.0–0.5)
Eosinophils Relative: 1 %
HCT: 40.7 % (ref 36.0–46.0)
Hemoglobin: 13.2 g/dL (ref 12.0–15.0)
Immature Granulocytes: 0 %
Lymphocytes Relative: 24 %
Lymphs Abs: 1.4 10*3/uL (ref 0.7–4.0)
MCH: 30 pg (ref 26.0–34.0)
MCHC: 32.4 g/dL (ref 30.0–36.0)
MCV: 92.5 fL (ref 80.0–100.0)
Monocytes Absolute: 0.5 10*3/uL (ref 0.1–1.0)
Monocytes Relative: 8 %
Neutro Abs: 3.8 10*3/uL (ref 1.7–7.7)
Neutrophils Relative %: 67 %
Platelets: 229 10*3/uL (ref 150–400)
RBC: 4.4 MIL/uL (ref 3.87–5.11)
RDW: 12.3 % (ref 11.5–15.5)
WBC: 5.8 10*3/uL (ref 4.0–10.5)
nRBC: 0 % (ref 0.0–0.2)

## 2021-01-14 LAB — LACTIC ACID, PLASMA: Lactic Acid, Venous: 1.4 mmol/L (ref 0.5–1.9)

## 2021-01-14 MED ORDER — SODIUM CHLORIDE 0.9 % IV BOLUS
1000.0000 mL | Freq: Once | INTRAVENOUS | Status: AC
  Administered 2021-01-14: 1000 mL via INTRAVENOUS

## 2021-01-14 MED ORDER — ACETAMINOPHEN 325 MG PO TABS
650.0000 mg | ORAL_TABLET | Freq: Once | ORAL | Status: AC
  Administered 2021-01-14: 650 mg via ORAL
  Filled 2021-01-14: qty 2

## 2021-01-14 MED ORDER — CIPROFLOXACIN HCL 500 MG PO TABS
500.0000 mg | ORAL_TABLET | Freq: Once | ORAL | Status: AC
  Administered 2021-01-14: 500 mg via ORAL
  Filled 2021-01-14: qty 1

## 2021-01-14 MED ORDER — CIPROFLOXACIN HCL 500 MG PO TABS: 500.0000 mg | ORAL_TABLET | Freq: Two times a day (BID) | ORAL | 0 refills | Status: DC

## 2021-01-14 NOTE — ED Notes (Signed)
Pt returned from CT °

## 2021-01-14 NOTE — Discharge Instructions (Addendum)
As we discussed Cipro, the antibiotic that you are on, does have some risks with it including risks of tendon injury, however given that you have not had your symptoms resolve with eardrops and other antibiotics at this point this treatment is indicated.  Please take Tylenol (acetaminophen) to relieve your pain.  You may take tylenol, up to 1,000 mg (two extra strength pills).  Do not take more than 3,000 mg tylenol in a 24 hour period.  Please check all medication labels as many medications such as pain and cold medications may contain tylenol. Please do not drink alcohol while taking this medication.   You may have diarrhea from the antibiotics.  It is very important that you continue to take the antibiotics even if you get diarrhea unless a medical professional tells you that you may stop taking them.  If you stop too early the bacteria you are being treated for will become stronger and you may need different, more powerful antibiotics that have more side effects and worsening diarrhea.  Please stay well hydrated and consider probiotics as they may decrease the severity of your diarrhea.

## 2021-01-14 NOTE — ED Notes (Signed)
Pt to CT via WC

## 2021-01-14 NOTE — ED Notes (Signed)
MD at bedside. 

## 2021-01-14 NOTE — ED Triage Notes (Signed)
Patient complains of right ear pain for two weeks. Seen at urgent care for same and was prescribed antibiotics, reports no improvement. Denies drainage. Afebrile in triage.

## 2021-01-14 NOTE — ED Provider Notes (Signed)
[facility] EMERGENCY DEPARTMENT Provider Note   CSN: [id number] Arrival date & time: 01/14/21  1500     History Chief Complaint  Patient presents with  . Otalgia    Brianna Velez is a 45 y.o. female with a past medical history of prediabetes, chronic back and abdominal pain, status post TAH, who presents today for evaluation of pain in her right ear.  She states this originally started on 12/25/2020. Chart review shows she was seen at urgent care at that point and started on Ciprodex drops for suspected acute otitis externa. She was then seen at urgent care again on 12/31/2020 at that point it is noted that her right ear canal still had some swelling and discharge with reports that the visible TM is red and bulging.  She was started on Augmentin at that point.  She reports that she has completed it and that she has had no significant change in her symptoms.  She states that she has been taking Tylenol for the pain.  She denies any fevers.  HPI     Past Medical History:  Diagnosis Date  . Anxiety and depression   . Fibroids   . History of 2019 novel coronavirus disease (COVID-19) 12/21/2019  . History of hiatal hernia   . Insomnia 02/11/2020  . Leg pain   . Migraine   . Sciatica     Patient Active Problem List   Diagnosis Date Noted  . Hot flashes 05/14/2020  . Gastroesophageal reflux disease without esophagitis 05/14/2020  . Chronic pain of both knees 05/14/2020  . Chronic left-sided low back pain without sciatica 08/14/2019  . Chronic abdominal pain 08/14/2019  . Vitamin D deficiency 08/14/2019  . Postmenopausal vaginal bleeding 06/05/2019  . Pneumonia of left lower lobe due to infectious organism 06/05/2019  . Cough 06/05/2019  . Generalized abdominal pain 05/21/2019  . Neuropathic pain 05/08/2019  . Anxiety and depression 05/08/2019  . Assistance needed with transportation 05/08/2019  . Peripheral edema 04/18/2019  . Abdominal distension (gaseous)  04/18/2019  . Flatulence/gas pain/belching 04/18/2019  . Intractable migraine without status migrainosus 04/18/2019  . Chronic pain syndrome 03/31/2016  . Screening for diabetes mellitus 12/31/2015  . S/P TAH (total abdominal hysterectomy) 09/29/2015  . Fibroid, uterine   . Menorrhagia with regular cycle   . Fibroid uterus 07/01/2015  . Left hip pain 11/06/2014    Past Surgical History:  Procedure Laterality Date  . ABDOMINAL HYSTERECTOMY  09/29/2015   Procedure: HYSTERECTOMY ABDOMINAL;  Surgeon: Catalina Antigua, MD;  Location: [facility];  Service: Gynecology;;  . BILATERAL SALPINGECTOMY Bilateral 09/29/2015   Procedure: BILATERAL SALPINGECTOMY;  Surgeon: Catalina Antigua, MD;  Location: [facility];  Service: Gynecology;  Laterality: Bilateral;  . TUBAL LIGATION    . WISDOM TOOTH EXTRACTION       OB History    Gravida  2   Para  2   Term  2   Preterm  0   AB  0   Living  2     SAB  0   IAB  0   Ectopic  0   Multiple  0   Live Births              Family History  Problem Relation Age of Onset  . Diabetes Sister   . Heart disease Paternal Grandmother   . Hypertension Father   . Dementia Mother   . Hypertension Mother     Social History   Tobacco Use  .  Smoking status: Former Smoker    Quit date: 12/05/1996    Years since quitting: 24.1  . Smokeless tobacco: Never Used  Vaping Use  . Vaping Use: Never used  Substance Use Topics  . Alcohol use: No  . Drug use: No    Home Medications Prior to Admission medications   Medication Sig Start Date End Date Taking? Authorizing Provider  amoxicillin-clavulanate (AUGMENTIN) 875-125 MG tablet Take 1 tablet by mouth every 12 (twelve) hours. 12/31/20   Georgetta Haber, NP  busPIRone (BUSPAR) 10 MG tablet TAKE 1 TABLET(10 MG) BY MOUTH THREE TIMES DAILY 11/12/20   Kallie Locks, FNP  cyclobenzaprine (FLEXERIL) 10 MG tablet Take 1 tablet, by mouth, 3 times a day as needed. 10/12/20   Kallie Locks, FNP  DULoxetine  (CYMBALTA) 20 MG capsule Take 1 capsule (20 mg total) by mouth daily. 12/30/20   Kallie Locks, FNP  furosemide (LASIX) 40 MG tablet TAKE 1 TABLET(40 MG) BY MOUTH TWICE DAILY 04/10/20   Kallie Locks, FNP  gabapentin (NEURONTIN) 600 MG tablet TAKE 1 TABLET(600 MG) BY MOUTH THREE TIMES DAILY 05/19/20   Kallie Locks, FNP  indomethacin (INDOCIN) 50 MG capsule Take 1 capsule (50 mg total) by mouth 2 (two) times daily with a meal. 05/13/20   Kallie Locks, FNP  meloxicam (MOBIC) 15 MG tablet TAKE 1 TABLET(15 MG) BY MOUTH DAILY 01/04/21   Kallie Locks, FNP  metFORMIN (GLUCOPHAGE) 500 MG tablet TAKE 1 TABLET(500 MG) BY MOUTH TWICE DAILY WITH A MEAL 12/21/20   Kallie Locks, FNP  Multiple Vitamins-Minerals (ONE-A-DAY WOMENS) tablet Take 1 tablet by mouth daily.    [provider]  pantoprazole (PROTONIX) 40 MG tablet Take 1 tablet (40 mg total) by mouth 2 (two) times daily as needed. 01/05/21   Kallie Locks, FNP    Allergies    Prednisone and Hydrocodone  Review of Systems   Review of Systems  Constitutional: Negative for chills and fever.  HENT: Positive for ear pain. Negative for dental problem, drooling, ear discharge, facial swelling, hearing loss, sore throat and trouble swallowing.   Eyes: Negative for visual disturbance.  Respiratory: Negative for cough and shortness of breath.   Genitourinary: Negative for dysuria.  Neurological: Negative for weakness and headaches.  All other systems reviewed and are negative.   Physical Exam Updated Vital Signs BP 116/74   Pulse (!) 104   Temp 98.7 F (37.1 C) (Oral)   Resp 16   LMP 08/14/2016   SpO2 100%   Physical Exam Vitals and nursing note reviewed.  Constitutional:      General: She is not in acute distress.    Appearance: She is not diaphoretic.  HENT:     Head: Normocephalic and atraumatic.     Right Ear: Swelling and tenderness (Ear movement pain and pain with movement of tragus.) present. No drainage.  There is mastoid tenderness.     Left Ear: Tympanic membrane, ear canal and external ear normal.  No middle ear effusion. There is no impacted cerumen. No mastoid tenderness. Tympanic membrane is not erythematous or bulging.     Ears:     Comments: Unable to view TM due to canal edema Eyes:     General: No scleral icterus.       Right eye: No discharge.        Left eye: No discharge.     Conjunctiva/sclera: Conjunctivae normal.  Cardiovascular:     Rate and  Rhythm: Normal rate and regular rhythm.  Pulmonary:     Effort: Pulmonary effort is normal. No respiratory distress.     Breath sounds: No stridor.  Abdominal:     General: There is no distension.  Musculoskeletal:        General: No deformity.     Cervical back: Normal range of motion.  Skin:    General: Skin is warm and dry.  Neurological:     Mental Status: She is alert.     Motor: No abnormal muscle tone.  Psychiatric:        Behavior: Behavior normal.     ED Results / Procedures / Treatments   Labs (all labs ordered are listed, but only abnormal results are displayed) Labs Reviewed  COMPREHENSIVE METABOLIC PANEL - Abnormal; Notable for the following components:      Result Value   Glucose, Bld 140 (*)    All other components within normal limits  LACTIC ACID, PLASMA  CBC WITH DIFFERENTIAL/PLATELET    EKG None  Radiology CT Temporal Bones Wo Contrast  Result Date: 01/14/2021 CLINICAL DATA:  Right ear pain over the last 2 weeks.  Mastoiditis. EXAM: CT TEMPORAL BONES WITHOUT CONTRAST TECHNIQUE: Axial and coronal plane CT imaging of the petrous temporal bones was performed with thin-collimation image reconstruction. No intravenous contrast was administered. Multiplanar CT image reconstructions were also generated. COMPARISON:  None. FINDINGS: Left temporal bone: External auditory canal shows mild narrowing proximally. Tympanic membrane is normal. Ossicular chain is normal. No fluid in the middle ear or mastoids.  Inner ear structures are normal. Right temporal bone: Similar mild narrowing of the proximal external auditory canal. Tympanic membrane appears normal. Ossicular chain is normal. No fluid in the middle ear or attic. Mastoid air cells are not as well developed as on the left, but there is no evidence of active inflammatory change. IMPRESSION: Mild narrowing of the proximal external auditory canals, which could simply be developmental or could relate to mild external otitis. No evidence of otitis media, cholesteatoma or mastoiditis. Mastoid air cells on the right are not as extensively developed is on the left, but this is not pathologic. Electronically Signed   By: Paulina Fusi M.D.   On: 01/14/2021 22:14    Procedures Procedures   Medications Ordered in ED Medications  sodium chloride 0.9 % bolus 1,000 mL (has no administration in time range)    ED Course  I have reviewed the triage vital signs and the nursing notes.  Pertinent labs & imaging results that were available during my care of the patient were reviewed by me and considered in my medical decision making (see chart for details).    MDM Rules/Calculators/A&P                         Patient is a 45 year old woman who presents today for evaluation of ongoing pain in her right ear.  Clinically on exam at this time she has right canal edema, I am unable to view TM.  Additionally she has tenderness without obvious fluctuance over the right mastoid and pain with right ear movements and tragus movements.  Clinically this is consistent with otitis externa.  Given that she has already apparently failed Ciprodex drops and p.o. Augmentin CT temporal bones is ordered to further evaluate for mastoiditis or spread of infection or other complicating factors.  CBC, CMP are unremarkable.  Lactic acid is not elevated.  CT shows bilateral narrowing of the ear  canals which may be congenital, no evidence of mastoiditis or other secondary complication.  Chart  review shows last EKG she did not have prolonged QT.  We discussed risks of cipro and she states her understanding.  We will treat with Tylenol and p.o. Cipro.  We discussed the importance of ENT follow-up.  Return precautions were discussed with patient who states their understanding.  At the time of discharge patient denied any unaddressed complaints or concerns.  Patient is agreeable for discharge home.  Note: Portions of this report may have been transcribed using voice recognition software. Every effort was made to ensure accuracy; however, inadvertent computerized transcription errors may be present  Final Clinical Impression(s) / ED Diagnoses Final diagnoses:  Otitis externa of right ear, unspecified chronicity, unspecified type  Right ear pain    Rx / DC Orders ED Discharge Orders    None       Norman Clay 01/14/21 2233    Arby Barrette, MD 01/25/21 [phone]

## 2021-01-15 ENCOUNTER — Other Ambulatory Visit: Payer: Self-pay | Admitting: Family Medicine

## 2021-01-15 DIAGNOSIS — M62838 Other muscle spasm: Secondary | ICD-10-CM

## 2021-01-15 MED ORDER — CYCLOBENZAPRINE HCL 10 MG PO TABS
ORAL_TABLET | ORAL | 0 refills | Status: DC
Start: 2021-01-15 — End: 2021-03-16

## 2021-01-20 ENCOUNTER — Other Ambulatory Visit: Payer: Self-pay | Admitting: Family Medicine

## 2021-01-20 DIAGNOSIS — R7303 Prediabetes: Secondary | ICD-10-CM

## 2021-01-22 ENCOUNTER — Encounter: Payer: Self-pay | Admitting: Family Medicine

## 2021-01-23 ENCOUNTER — Other Ambulatory Visit: Payer: Self-pay | Admitting: Family Medicine

## 2021-01-23 DIAGNOSIS — M62838 Other muscle spasm: Secondary | ICD-10-CM

## 2021-01-26 ENCOUNTER — Ambulatory Visit (INDEPENDENT_AMBULATORY_CARE_PROVIDER_SITE_OTHER): Payer: Medicaid Other | Admitting: Family Medicine

## 2021-01-26 ENCOUNTER — Encounter: Payer: Self-pay | Admitting: Family Medicine

## 2021-01-26 ENCOUNTER — Other Ambulatory Visit: Payer: Self-pay

## 2021-01-26 VITALS — BP 115/66 | HR 105 | Ht 64.0 in | Wt 207.0 lb

## 2021-01-26 DIAGNOSIS — R140 Abdominal distension (gaseous): Secondary | ICD-10-CM | POA: Diagnosis not present

## 2021-01-26 DIAGNOSIS — R7303 Prediabetes: Secondary | ICD-10-CM | POA: Diagnosis not present

## 2021-01-26 DIAGNOSIS — F419 Anxiety disorder, unspecified: Secondary | ICD-10-CM | POA: Diagnosis not present

## 2021-01-26 DIAGNOSIS — R232 Flushing: Secondary | ICD-10-CM

## 2021-01-26 DIAGNOSIS — H6690 Otitis media, unspecified, unspecified ear: Secondary | ICD-10-CM

## 2021-01-26 DIAGNOSIS — Z09 Encounter for follow-up examination after completed treatment for conditions other than malignant neoplasm: Secondary | ICD-10-CM | POA: Diagnosis not present

## 2021-01-26 DIAGNOSIS — M792 Neuralgia and neuritis, unspecified: Secondary | ICD-10-CM | POA: Diagnosis not present

## 2021-01-26 DIAGNOSIS — Z0000 Encounter for general adult medical examination without abnormal findings: Secondary | ICD-10-CM

## 2021-01-26 DIAGNOSIS — F32A Depression, unspecified: Secondary | ICD-10-CM

## 2021-01-26 LAB — POCT GLYCOSYLATED HEMOGLOBIN (HGB A1C): Hemoglobin A1C: 6.8 % — AB (ref 4.0–5.6)

## 2021-01-26 MED ORDER — OFLOXACIN 0.3 % OT SOLN: 5.0000 [drp] | Freq: Every day | OTIC | 0 refills | Status: DC

## 2021-01-26 NOTE — Patient Instructions (Signed)
Ofloxacin ear solution What is this medicine? OFLOXACIN (oh FLOKS a sin) is a quinolone antibiotic. It is used to treat bacterial ear infections. This medicine may be used for other purposes; ask your health care provider or pharmacist if you have questions. COMMON BRAND NAME(S): Floxin What should I tell my health care provider before I take this medicine? They need to know if you have any of these conditions:  difficulty hearing  an unusual or allergic reaction to ofloxacin, quinolone antibiotics, other medicines, foods, dyes, or preservatives  pregnant or trying to get pregnant  breast-feeding How should I use this medicine? This medicine is only for use in the ear. Wash your hands with soap and water. Do not insert any object or swab into the ear canal. Gently warm the bottle by holding it in the hand for 1 to 2 minutes. Gently clean any fluid that can be easily removed from the outer ear. Lie down on your side with the infected ear up. Try not to touch the tip of the dropper to your ear, fingertips, or other surface. Squeeze the bottle gently to put the prescribed number of drops in the ear canal. For ear canal infections, gently pull the outer ear upward and backward to help the drops flow down into the ear canal. For middle ear infections, press the skin-covered cartilage in the front part of the ear 4 times in a pumping motion to allow the drops to pass through the hole or tube in the eardrum. Keep lying down with the ear up for about 5 minutes to make sure the drops stay in the ear. Repeat the steps for the other ear if both ears are infected. Do not use your medicine more often than directed. Finish the full course of medicine prescribed by your doctor or health care professional even if you think your condition is better. Talk to your pediatrician regarding the use of this medicine in children. While this drug may be prescribed for children as young as 45 months of age and older for  selected conditions, precautions do apply. Overdosage: If you think you have taken too much of this medicine contact a poison control center or emergency room at once. NOTE: This medicine is only for you. Do not share this medicine with others. What if I miss a dose? If you miss a dose, use it as soon as you can. If it is almost time for your next dose, use only that dose. Do not use double or extra doses. What may interact with this medicine? Interactions are not expected. Do not use any other ear products without talking to your doctor or health care professional. This list may not describe all possible interactions. Give your health care provider a list of all the medicines, herbs, non-prescription drugs, or dietary supplements you use. Also tell them if you smoke, drink alcohol, or use illegal drugs. Some items may interact with your medicine. What should I watch for while using this medicine? Tell your doctor or health care professional if your ear infection does not get better in a few days. After you finish the full course of treatment, tell your doctor or health care professional if you have two or more episodes of drainage from the ear within 6 months. It is important that you keep the infected ear(s) clean and dry. When bathing, try not to get the infected ear(s) wet. Do not go swimming unless your doctor or health care professional has told you otherwise. To prevent  the spread of infection, do not share ear products, or share towels and washcloths with anyone else. What side effects may I notice from receiving this medicine? Side effects that you should report to your doctor or health care professional as soon as possible:  burning, blistering, itching, and redness  dizziness  rash  worsening ear pain Side effects that usually do not require medical attention (report to your doctor or health care professional if they continue or are bothersome):  abnormal sensation in the  ear  bad taste in mouth  unpleasant sensation while putting the drops in the ear This list may not describe all possible side effects. Call your doctor for medical advice about side effects. You may report side effects to FDA at 1-800-FDA-1088. Where should I keep my medicine? Keep out of the reach of children. Store at room temperature between 15 and 25 degrees C (59 and 77 degrees F). Throw away any unused medicine after the expiration date. NOTE: This sheet is a summary. It may not cover all possible information. If you have questions about this medicine, talk to your doctor, pharmacist, or health care provider.  2021 Elsevier/Gold Standard (2008-06-17 16:48:15) Otitis Media, Adult  Otitis media is a condition in which the middle ear is red and swollen (inflamed) and full of fluid. The middle ear is the part of the ear that contains bones for hearing as well as air that helps send sounds to the brain. The condition usually goes away on its own. What are the causes? This condition is caused by a blockage in the eustachian tube. The eustachian tube connects the middle ear to the back of the nose. It normally allows air into the middle ear. The blockage is caused by fluid or swelling. Problems that can cause blockage include:  A cold or infection that affects the nose, mouth, or throat.  Allergies.  An irritant, such as tobacco smoke.  Adenoids that have become large. The adenoids are soft tissue located in the back of the throat, behind the nose and the roof of the mouth.  Growth or swelling in the upper part of the throat, just behind the nose (nasopharynx).  Damage to the ear caused by change in pressure. This is called barotrauma. What are the signs or symptoms? Symptoms of this condition include:  Ear pain.  Fever.  Problems with hearing.  Being tired.  Fluid leaking from the ear.  Ringing in the ear. How is this treated? This condition can go away on its own within  3-5 days. But if the condition is caused by bacteria or does not go away on its own, or if it keeps coming back, your doctor may:  Give you antibiotic medicines.  Give you medicines for pain. Follow these instructions at home:  Take over-the-counter and prescription medicines only as told by your doctor.  If you were prescribed an antibiotic medicine, take it as told by your doctor. Do not stop taking the antibiotic even if you start to feel better.  Keep all follow-up visits as told by your doctor. This is important. Contact a doctor if:  You have bleeding from your nose.  There is a lump on your neck.  You are not feeling better in 5 days.  You feel worse instead of better. Get help right away if:  You have pain that is not helped with medicine.  You have swelling, redness, or pain around your ear.  You get a stiff neck.  You cannot move  part of your face (paralysis).  You notice that the bone behind your ear hurts when you touch it.  You get a very bad headache. Summary  Otitis media means that the middle ear is red, swollen, and full of fluid.  This condition usually goes away on its own.  If the problem does not go away, treatment may be needed. You may be given medicines to treat the infection or to treat your pain.  If you were prescribed an antibiotic medicine, take it as told by your doctor. Do not stop taking the antibiotic even if you start to feel better.  Keep all follow-up visits as told by your doctor. This is important. This information is not intended to replace advice given to you by your health care provider. Make sure you discuss any questions you have with your health care provider. Document Revised: 10/24/2019 Document Reviewed: 10/24/2019 Elsevier Patient Education  2021 [employer].

## 2021-01-26 NOTE — Progress Notes (Signed)
Patient [facility]  Annual Physical  Subjective:  Patient ID: Brianna Velez, female    DOB: 08/21/1976  Age: 45 y.o. MRN: [id number]  CC:  Chief Complaint  Patient presents with  . Diabetes    HPI Brianna Velez is a 45 year old female who presents for Follow Up today.   Patient Active Problem List   Diagnosis Date Noted  . Hot flashes 05/14/2020  . Gastroesophageal reflux disease without esophagitis 05/14/2020  . Chronic pain of both knees 05/14/2020  . Chronic left-sided low back pain without sciatica 08/14/2019  . Chronic abdominal pain 08/14/2019  . Vitamin D deficiency 08/14/2019  . Postmenopausal vaginal bleeding 06/05/2019  . Pneumonia of left lower lobe due to infectious organism 06/05/2019  . Cough 06/05/2019  . Generalized abdominal pain 05/21/2019  . Neuropathic pain 05/08/2019  . Anxiety and depression 05/08/2019  . Assistance needed with transportation 05/08/2019  . Peripheral edema 04/18/2019  . Abdominal distension (gaseous) 04/18/2019  . Flatulence/gas pain/belching 04/18/2019  . Intractable migraine without status migrainosus 04/18/2019  . Chronic pain syndrome 03/31/2016  . Screening for diabetes mellitus 12/31/2015  . S/P TAH (total abdominal hysterectomy) 09/29/2015  . Fibroid, uterine   . Menorrhagia with regular cycle   . Fibroid uterus 07/01/2015  . Left hip pain 11/06/2014    Current Status: Since her last office visit, she has c/o right ear pain, which she was prescribed Cipro ear drops on 01/14/2021. She states that medication was not effective. She denies fevers, chills, fatigue, recent infections, weight loss, and night sweats. She has not had any headaches, visual changes, dizziness, and falls. No chest pain, heart palpitations, cough and shortness of breath reported. Denies GI problems such as nausea, vomiting, diarrhea, and constipation. She has no reports of blood in stools, dysuria and hematuria.  Her anxiety is increased today r/t her sister's recent diagnosis of epilepsy. Her sister is doing well. No depression or anxiety, and denies suicidal ideations, homicidal ideations, or auditory hallucinations. She is taking all medications as prescribed.   Past Medical History:  Diagnosis Date  . Anxiety and depression   . Fibroids   . History of 2019 novel coronavirus disease (COVID-19) 12/21/2019  . History of hiatal hernia   . Insomnia 02/11/2020  . Leg pain   . Migraine   . Sciatica     Past Surgical History:  Procedure Laterality Date  . ABDOMINAL HYSTERECTOMY  09/29/2015   Procedure: HYSTERECTOMY ABDOMINAL;  Surgeon: Mora Bellman, MD;  Location: [facility];  Service: Gynecology;;  . BILATERAL SALPINGECTOMY Bilateral 09/29/2015   Procedure: BILATERAL SALPINGECTOMY;  Surgeon: Mora Bellman, MD;  Location: [facility];  Service: Gynecology;  Laterality: Bilateral;  . TUBAL LIGATION    . WISDOM TOOTH EXTRACTION      Family History  Problem Relation Age of Onset  . Diabetes Sister   . Heart disease Paternal Grandmother   . Hypertension Father   . Dementia Mother   . Hypertension Mother     Social History   Socioeconomic History  . Marital status: Single    Spouse name: Not on file  . Number of children: Not on file  . Years of education: Not on file  . Highest education level: Not on file  Occupational History  . Not on file  Tobacco Use  . Smoking status: Former Smoker    Quit date: 12/05/1996    Years since quitting: 24.1  . Smokeless tobacco: Never Used  Vaping Use  . Vaping Use: Never used  Substance and Sexual Activity  . Alcohol use: No  . Drug use: No  . Sexual activity: Not Currently    Birth control/protection: Surgical  Other Topics Concern  . Not on file  Social History Narrative  . Not on file   Social Determinants of Health   Financial Resource Strain: Not on file  Food Insecurity: Not on file  Transportation Needs: Not on file  Physical  Activity: Not on file  Stress: Not on file  Social Connections: Not on file  Intimate Partner Violence: Not on file    Outpatient Medications Prior to Visit  Medication Sig Dispense Refill  . busPIRone (BUSPAR) 10 MG tablet TAKE 1 TABLET(10 MG) BY MOUTH THREE TIMES DAILY 180 tablet 3  . cyclobenzaprine (FLEXERIL) 10 MG tablet Take 1 tablet, by mouth, 3 times a day as needed. 90 tablet 0  . DULoxetine (CYMBALTA) 20 MG capsule Take 1 capsule (20 mg total) by mouth daily. 90 capsule 3  . furosemide (LASIX) 40 MG tablet TAKE 1 TABLET(40 MG) BY MOUTH TWICE DAILY 60 tablet 3  . gabapentin (NEURONTIN) 600 MG tablet TAKE 1 TABLET(600 MG) BY MOUTH THREE TIMES DAILY 270 tablet 2  . indomethacin (INDOCIN) 50 MG capsule Take 1 capsule (50 mg total) by mouth 2 (two) times daily with a meal. 60 capsule 6  . meloxicam (MOBIC) 15 MG tablet TAKE 1 TABLET(15 MG) BY MOUTH DAILY 60 tablet 1  . metFORMIN (GLUCOPHAGE) 500 MG tablet TAKE 1 TABLET(500 MG) BY MOUTH TWICE DAILY WITH A MEAL 180 tablet 3  . Multiple Vitamins-Minerals (ONE-A-DAY WOMENS) tablet Take 1 tablet by mouth daily.    . pantoprazole (PROTONIX) 40 MG tablet Take 1 tablet (40 mg total) by mouth 2 (two) times daily as needed. 60 tablet 0  . amoxicillin-clavulanate (AUGMENTIN) 875-125 MG tablet Take 1 tablet by mouth every 12 (twelve) hours. 14 tablet 0  . ciprofloxacin (CIPRO) 500 MG tablet Take 1 tablet (500 mg total) by mouth every 12 (twelve) hours. 14 tablet 0   No facility-administered medications prior to visit.    Allergies  Allergen Reactions  . Prednisone     Insomnia "it has me up all night"  . Hydrocodone Rash    ROS Review of Systems  Constitutional: Negative.   HENT: Negative.   Eyes: Positive for pain and redness.  Respiratory: Negative.   Cardiovascular: Negative.   Gastrointestinal: Positive for abdominal distention (obese).  Endocrine: Negative.   Genitourinary: Negative.   Musculoskeletal: Positive for arthralgias  (generalized joint pain).  Skin: Negative.   Allergic/Immunologic: Negative.   Neurological: Positive for dizziness (occasional ) and headaches (occasional ).  Hematological: Negative.   Psychiatric/Behavioral: Negative.       Objective:    Physical Exam Vitals and nursing note reviewed.  Constitutional:      Appearance: Normal appearance.  HENT:     Head: Normocephalic and atraumatic.     Ears:     Comments: Erythema  Painful     Mouth/Throat:     Mouth: Mucous membranes are moist.     Pharynx: Oropharynx is clear.  Cardiovascular:     Rate and Rhythm: Normal rate and regular rhythm.     Pulses: Normal pulses.     Heart sounds: Normal heart sounds.  Pulmonary:     Effort: Pulmonary effort is normal.     Breath sounds: Normal breath sounds.  Abdominal:     General: Bowel sounds  are normal.     Palpations: Abdomen is soft.  Musculoskeletal:        General: Normal range of motion.     Cervical back: Normal range of motion and neck supple.  Skin:    General: Skin is warm and dry.  Neurological:     General: No focal deficit present.     Mental Status: She is alert and oriented to person, place, and time.  Psychiatric:        Mood and Affect: Mood normal.        Behavior: Behavior normal.        Thought Content: Thought content normal.        Judgment: Judgment normal.     BP 115/66   Pulse (!) 105   Ht 5\' 4"  (1.626 m)   Wt 207 lb (93.9 kg)   LMP 08/14/2016   SpO2 97%   BMI 35.53 kg/m  Wt Readings from Last 3 Encounters:  01/26/21 207 lb (93.9 kg)  05/13/20 191 lb 0.4 oz (86.6 kg)  02/11/20 191 lb (86.6 kg)     Health Maintenance Due  Topic Date Due  . Hepatitis C Screening  Never done  . PAP SMEAR-Modifier  04/30/2017  . COVID-19 Vaccine (2 - Booster for [employer] series) 06/15/2020  . INFLUENZA VACCINE  07/05/2020    There are no preventive care reminders to display for this patient.  Lab Results  Component Value Date   TSH 0.973 02/11/2020    Lab Results  Component Value Date   WBC 5.8 01/14/2021   HGB 13.2 01/14/2021   HCT 40.7 01/14/2021   MCV 92.5 01/14/2021   PLT 229 01/14/2021   Lab Results  Component Value Date   NA 139 01/14/2021   K 4.4 01/14/2021   CO2 31 01/14/2021   GLUCOSE 140 (H) 01/14/2021   BUN 9 01/14/2021   CREATININE 0.76 01/14/2021   BILITOT 0.3 01/14/2021   ALKPHOS 84 01/14/2021   AST 28 01/14/2021   ALT 40 01/14/2021   PROT 7.9 01/14/2021   ALBUMIN 3.8 01/14/2021   CALCIUM 9.6 01/14/2021   ANIONGAP 10 01/14/2021   Lab Results  Component Value Date   CHOL 207 (H) 02/11/2020   Lab Results  Component Value Date   HDL 41 02/11/2020   Lab Results  Component Value Date   LDLCALC 130 (H) 02/11/2020   Lab Results  Component Value Date   TRIG 199 (H) 02/11/2020   Lab Results  Component Value Date   CHOLHDL 5.0 (H) 02/11/2020   Lab Results  Component Value Date   HGBA1C 6.8 (A) 01/26/2021      Assessment & Plan:   1. Acute otitis media, unspecified otitis media type We will initiate Ofloxacin otic drops today.  - ofloxacin (FLOXIN) 0.3 % OTIC solution; Place 5 drops into the right ear daily.  Dispense: 5 mL; Refill: 0  2. Pre-diabetes Hgb A1c stable at 6.8 today. Monitor. - POCT glycosylated hemoglobin (Hb A1C)  3. Anxiety and depression  4. Hot flashes Stable. Not worsening.   5. Neuropathic pain  6. Abdominal distension (gaseous)  7. Healthcare maintenance - Lipid Panel - TSH - Vitamin B12 - Vitamin D, 25-hydroxy  8. Follow up She will follow up in 6 months.   Meds ordered this encounter  Medications  . ofloxacin (FLOXIN) 0.3 % OTIC solution    Sig: Place 5 drops into the right ear daily.    Dispense:  5 mL  Refill:  0   Orders Placed This Encounter  Procedures  . Lipid Panel  . TSH  . Vitamin B12  . Vitamin D, 25-hydroxy  . POCT glycosylated hemoglobin (Hb A1C)    Referral Orders  No referral(s) requested today    Kathe Becton, MSN,  ANE, FNP-BC [facility] [address], [place] [address] [phone]- fax  Problem List Items Addressed This Visit      Cardiovascular and Mediastinum   Hot flashes     Other   Abdominal distension (gaseous)   Anxiety and depression   Neuropathic pain    Other Visit Diagnoses    Acute otitis media, unspecified otitis media type    -  Primary   Relevant Medications   ofloxacin (FLOXIN) 0.3 % OTIC solution   Pre-diabetes       Relevant Orders   POCT glycosylated hemoglobin (Hb A1C) (Completed)   Healthcare maintenance       Relevant Orders   Lipid Panel   TSH   Vitamin B12   Vitamin D, 25-hydroxy   Follow up          Meds ordered this encounter  Medications  . ofloxacin (FLOXIN) 0.3 % OTIC solution    Sig: Place 5 drops into the right ear daily.    Dispense:  5 mL    Refill:  0    Follow-up: No follow-ups on file.    Azzie Glatter, FNP

## 2021-01-27 LAB — LIPID PANEL
Chol/HDL Ratio: 4.8 ratio — ABNORMAL HIGH (ref 0.0–4.4)
Cholesterol, Total: 216 mg/dL — ABNORMAL HIGH (ref 100–199)
HDL: 45 mg/dL (ref >39)
LDL Chol Calc (NIH): 146 mg/dL — ABNORMAL HIGH (ref 0–99)
Triglycerides: 137 mg/dL (ref 0–149)
VLDL Cholesterol Cal: 25 mg/dL (ref 5–40)

## 2021-01-27 LAB — TSH: TSH: 0.831 u[IU]/mL (ref 0.450–4.500)

## 2021-01-27 LAB — VITAMIN B12: Vitamin B-12: 800 pg/mL (ref 232–1245)

## 2021-01-27 LAB — VITAMIN D 25 HYDROXY (VIT D DEFICIENCY, FRACTURES): Vit D, 25-Hydroxy: 37.1 ng/mL (ref 30.0–100.0)

## 2021-02-05 ENCOUNTER — Other Ambulatory Visit: Payer: Self-pay | Admitting: Family Medicine

## 2021-02-05 DIAGNOSIS — R232 Flushing: Secondary | ICD-10-CM

## 2021-02-05 DIAGNOSIS — M792 Neuralgia and neuritis, unspecified: Secondary | ICD-10-CM

## 2021-02-08 NOTE — Telephone Encounter (Signed)
Please see refill request.

## 2021-02-11 ENCOUNTER — Other Ambulatory Visit: Payer: Self-pay | Admitting: Family Medicine

## 2021-02-11 DIAGNOSIS — K219 Gastro-esophageal reflux disease without esophagitis: Secondary | ICD-10-CM

## 2021-02-24 ENCOUNTER — Telehealth: Payer: Self-pay | Admitting: Clinical

## 2021-02-24 ENCOUNTER — Other Ambulatory Visit: Payer: Self-pay

## 2021-02-24 ENCOUNTER — Encounter: Payer: Self-pay | Admitting: Family Medicine

## 2021-02-24 ENCOUNTER — Ambulatory Visit (INDEPENDENT_AMBULATORY_CARE_PROVIDER_SITE_OTHER): Payer: Medicaid Other | Admitting: Family Medicine

## 2021-02-24 VITALS — BP 116/83 | HR 111 | Ht 64.0 in | Wt 207.0 lb

## 2021-02-24 DIAGNOSIS — G8929 Other chronic pain: Secondary | ICD-10-CM

## 2021-02-24 DIAGNOSIS — Z09 Encounter for follow-up examination after completed treatment for conditions other than malignant neoplasm: Secondary | ICD-10-CM | POA: Diagnosis not present

## 2021-02-24 DIAGNOSIS — R7303 Prediabetes: Secondary | ICD-10-CM

## 2021-02-24 DIAGNOSIS — R140 Abdominal distension (gaseous): Secondary | ICD-10-CM | POA: Diagnosis not present

## 2021-02-24 DIAGNOSIS — R109 Unspecified abdominal pain: Secondary | ICD-10-CM

## 2021-02-24 DIAGNOSIS — R635 Abnormal weight gain: Secondary | ICD-10-CM

## 2021-02-24 DIAGNOSIS — Z0000 Encounter for general adult medical examination without abnormal findings: Secondary | ICD-10-CM | POA: Diagnosis not present

## 2021-02-24 NOTE — Progress Notes (Signed)
Patient [facility]   Annual Physical   Subjective:  Patient ID: Brianna Velez, female    DOB: 08-09-1976  Age: 45 y.o. MRN: [id number]  CC:  Chief Complaint  Patient presents with  . Annual Exam    HPI Brianna Velez is a 45 year old female who presents for Follow Up today.   Patient Active Problem List   Diagnosis Date Noted  . Hot flashes 05/14/2020  . Gastroesophageal reflux disease without esophagitis 05/14/2020  . Chronic pain of both knees 05/14/2020  . Chronic left-sided low back pain without sciatica 08/14/2019  . Chronic abdominal pain 08/14/2019  . Vitamin D deficiency 08/14/2019  . Postmenopausal vaginal bleeding 06/05/2019  . Pneumonia of left lower lobe due to infectious organism 06/05/2019  . Cough 06/05/2019  . Generalized abdominal pain 05/21/2019  . Neuropathic pain 05/08/2019  . Anxiety and depression 05/08/2019  . Assistance needed with transportation 05/08/2019  . Peripheral edema 04/18/2019  . Abdominal distension (gaseous) 04/18/2019  . Flatulence/gas pain/belching 04/18/2019  . Intractable migraine without status migrainosus 04/18/2019  . Chronic pain syndrome 03/31/2016  . Screening for diabetes mellitus 12/31/2015  . S/P TAH (total abdominal hysterectomy) 09/29/2015  . Fibroid, uterine   . Menorrhagia with regular cycle   . Fibroid uterus 07/01/2015  . Left hip pain 11/06/2014   Current Status: Since his last office visit, he is doing well with no complaints. She continues to have chronic his pain, which she is taking Gabapentin with moderate relief. Her anxiety is moderate today r/t her weight gain over the past 2 years. She denies suicidal ideations, homicidal ideations, or auditory hallucinations. She denies fevers, chills, fatigue, recent infections, weight loss, and night sweats. She has not had any headaches, visual changes, dizziness, and falls. No chest pain, heart palpitations, cough and shortness  of breath reported. Denies GI problems such as nausea, vomiting, diarrhea, and constipation. She has no reports of blood in stools, dysuria and hematuria. She is taking all medications as prescribed.   Past Medical History:  Diagnosis Date  . Anxiety and depression   . Fibroids   . History of 2019 novel coronavirus disease (COVID-19) 12/21/2019  . History of hiatal hernia   . Insomnia 02/11/2020  . Leg pain   . Migraine   . Sciatica     Past Surgical History:  Procedure Laterality Date  . ABDOMINAL HYSTERECTOMY  09/29/2015   Procedure: HYSTERECTOMY ABDOMINAL;  Surgeon: Mora Bellman, MD;  Location: [facility];  Service: Gynecology;;  . BILATERAL SALPINGECTOMY Bilateral 09/29/2015   Procedure: BILATERAL SALPINGECTOMY;  Surgeon: Mora Bellman, MD;  Location: [facility];  Service: Gynecology;  Laterality: Bilateral;  . TUBAL LIGATION    . WISDOM TOOTH EXTRACTION      Family History  Problem Relation Age of Onset  . Diabetes Sister   . Heart disease Paternal Grandmother   . Hypertension Father   . Dementia Mother   . Hypertension Mother     Social History   Socioeconomic History  . Marital status: Single    Spouse name: Not on file  . Number of children: Not on file  . Years of education: Not on file  . Highest education level: Not on file  Occupational History  . Not on file  Tobacco Use  . Smoking status: Former Smoker    Quit date: 12/05/1996    Years since quitting: 24.2  . Smokeless tobacco: Never Used  Vaping Use  . Vaping  Use: Never used  Substance and Sexual Activity  . Alcohol use: No  . Drug use: No  . Sexual activity: Not Currently    Birth control/protection: Surgical  Other Topics Concern  . Not on file  Social History Narrative  . Not on file   Social Determinants of Health   Financial Resource Strain: Not on file  Food Insecurity: Not on file  Transportation Needs: Not on file  Physical Activity: Not on file  Stress: Not on file  Social  Connections: Not on file  Intimate Partner Violence: Not on file    Outpatient Medications Prior to Visit  Medication Sig Dispense Refill  . busPIRone (BUSPAR) 10 MG tablet TAKE 1 TABLET(10 MG) BY MOUTH THREE TIMES DAILY 180 tablet 3  . cyclobenzaprine (FLEXERIL) 10 MG tablet Take 1 tablet, by mouth, 3 times a day as needed. 90 tablet 0  . DULoxetine (CYMBALTA) 20 MG capsule Take 1 capsule (20 mg total) by mouth daily. 90 capsule 3  . furosemide (LASIX) 40 MG tablet TAKE 1 TABLET(40 MG) BY MOUTH TWICE DAILY 60 tablet 3  . gabapentin (NEURONTIN) 600 MG tablet TAKE 1 TABLET(600 MG) BY MOUTH THREE TIMES DAILY 270 tablet 3  . meloxicam (MOBIC) 15 MG tablet TAKE 1 TABLET(15 MG) BY MOUTH DAILY 60 tablet 1  . metFORMIN (GLUCOPHAGE) 500 MG tablet TAKE 1 TABLET(500 MG) BY MOUTH TWICE DAILY WITH A MEAL 180 tablet 3  . Multiple Vitamins-Minerals (ONE-A-DAY WOMENS) tablet Take 1 tablet by mouth daily.    Marland Kitchen ofloxacin (FLOXIN) 0.3 % OTIC solution Place 5 drops into the right ear daily. 5 mL 0  . pantoprazole (PROTONIX) 40 MG tablet TAKE 1 TABLET(40 MG) BY MOUTH TWICE DAILY AS NEEDED 60 tablet 0  . indomethacin (INDOCIN) 50 MG capsule Take 1 capsule (50 mg total) by mouth 2 (two) times daily with a meal. (Patient not taking: Reported on 02/24/2021) 60 capsule 6   No facility-administered medications prior to visit.    Allergies  Allergen Reactions  . Prednisone     Insomnia "it has me up all night"  . Hydrocodone Rash    ROS Review of Systems  Constitutional: Negative.   HENT: Negative.   Eyes: Negative.   Respiratory: Negative.   Cardiovascular: Negative.   Gastrointestinal: Positive for abdominal distention (obese) and abdominal pain (chronic).  Endocrine: Negative.   Genitourinary: Negative.   Musculoskeletal: Positive for arthralgias (generalized chronic pain).  Skin: Negative.   Allergic/Immunologic: Negative.   Neurological: Positive for dizziness (occasional ) and headaches  (occasional).  Hematological: Negative.   Psychiatric/Behavioral: Negative.       Objective:    Physical Exam Vitals and nursing note reviewed.  Constitutional:      Appearance: Normal appearance.  HENT:     Head: Normocephalic and atraumatic.     Nose: Nose normal.     Mouth/Throat:     Mouth: Mucous membranes are moist.     Pharynx: Oropharynx is clear.  Cardiovascular:     Rate and Rhythm: Normal rate and regular rhythm.     Pulses: Normal pulses.     Heart sounds: Normal heart sounds.  Pulmonary:     Effort: Pulmonary effort is normal.     Breath sounds: Normal breath sounds.  Abdominal:     General: Bowel sounds are normal. There is distension (obese).     Palpations: Abdomen is soft.  Musculoskeletal:        General: Normal range of motion.  Cervical back: Normal range of motion and neck supple.  Skin:    General: Skin is warm and dry.  Neurological:     General: No focal deficit present.     Mental Status: She is alert and oriented to person, place, and time.  Psychiatric:        Mood and Affect: Mood normal.        Thought Content: Thought content normal.        Judgment: Judgment normal.     BP 116/83   Pulse (!) 111   Ht 5\' 4"  (1.626 m)   Wt 207 lb (93.9 kg)   LMP 08/14/2016   BMI 35.53 kg/m  Wt Readings from Last 3 Encounters:  02/24/21 207 lb (93.9 kg)  01/26/21 207 lb (93.9 kg)  05/13/20 191 lb 0.4 oz (86.6 kg)     There are no preventive care reminders to display for this patient.  There are no preventive care reminders to display for this patient.  Lab Results  Component Value Date   TSH 0.831 01/26/2021   Lab Results  Component Value Date   WBC 5.8 01/14/2021   HGB 13.2 01/14/2021   HCT 40.7 01/14/2021   MCV 92.5 01/14/2021   PLT 229 01/14/2021   Lab Results  Component Value Date   NA 139 01/14/2021   K 4.4 01/14/2021   CO2 31 01/14/2021   GLUCOSE 140 (H) 01/14/2021   BUN 9 01/14/2021   CREATININE 0.76 01/14/2021    BILITOT 0.3 01/14/2021   ALKPHOS 84 01/14/2021   AST 28 01/14/2021   ALT 40 01/14/2021   PROT 7.9 01/14/2021   ALBUMIN 3.8 01/14/2021   CALCIUM 9.6 01/14/2021   ANIONGAP 10 01/14/2021   Lab Results  Component Value Date   CHOL 216 (H) 01/26/2021   Lab Results  Component Value Date   HDL 45 01/26/2021   Lab Results  Component Value Date   LDLCALC 146 (H) 01/26/2021   Lab Results  Component Value Date   TRIG 137 01/26/2021   Lab Results  Component Value Date   CHOLHDL 4.8 (H) 01/26/2021   Lab Results  Component Value Date   HGBA1C 6.8 (A) 01/26/2021      Assessment & Plan:   1. Annual physical exam Physical assessment within normal for age. Basic Neurology assessment normal.  Follow-up for scheduled mammogram as needed.  Recommend monthly self breast exam Recommend daily multivitamin for women Recommend strength training in 150 minutes of cardiovascular exercise per week  2. Prediabetes Hgb A1c at 6.8. Monitor.  3. Abdominal distension (gaseous) - Amb Ref to Medical Weight Management - CT Abdomen Pelvis W Contrast; Future  4. Chronic abdominal pain - Amb Ref to Medical Weight Management - CT Abdomen Pelvis W Contrast; Future  5. Weight gain  6. Follow up She will follow up in 6 months.   No orders of the defined types were placed in this encounter.     Orders Placed This Encounter  Procedures  . CT Abdomen Pelvis W Contrast  . Amb Ref to Medical Weight Management     Referral Orders     Amb Ref to Medical Weight Management   Kathe Becton, MSN, ANE, FNP-BC [facility] [address] [place], [place] [address] [phone] fax   Problem List Items Addressed This Visit      Other   Abdominal distension (gaseous)   Relevant Orders  Amb Ref to Medical Weight Management   CT Abdomen Pelvis W Contrast   Chronic abdominal pain    Relevant Orders   Amb Ref to Medical Weight Management   CT Abdomen Pelvis W Contrast    Other Visit Diagnoses    Annual physical exam    -  Primary   Prediabetes       Weight gain       Follow up          No orders of the defined types were placed in this encounter.   Follow-up: No follow-ups on file.    Azzie Glatter, FNP

## 2021-02-24 NOTE — Telephone Encounter (Signed)
Integrated [facility]al Health Case Management Referral Note  02/24/2021 Name: Obdulia Steier MRN: [id number] DOB: 01-Apr-1976 Lashawndra Lampkins is a 45 y.o. year old female who sees Azzie Glatter, FNP for primary care. LCSW was consulted to assess patient's needs and assist the patient with Transportation Needs .  Interpreter: No.   Interpreter Name & Language: none  Assessment: Patient experiencing Transportation barriers.  Intervention: CSW called patient after PCP visit for referral about transportation needs. Patient cannot afford gas to drive to her appointments. CSW enrolled patient in [employer]. Patient reported she has an ENT appointment outside of the [facility]. CSW referred patient to [facility] transportation for that appointment. CSW and patient called Medicaid office together and left a voicemail requesting call back to patient for enrollment in their transportation services. CSW available from clinic as needed.  Review of patient status, including review of consultants reports, relevant laboratory and other test results, and collaboration with appropriate care team members and the patient's provider was performed as part of comprehensive patient evaluation and provision of services.    SDOH (Social Determinants of Health) assessments performed: No   Goals Addressed   None      Follow up Plan: 1. CSW available from clinic as needed.  Estanislado Emms, [facility] [phone]

## 2021-02-25 ENCOUNTER — Encounter: Payer: Self-pay | Admitting: Family Medicine

## 2021-03-01 ENCOUNTER — Encounter: Payer: Self-pay | Admitting: Family Medicine

## 2021-03-11 ENCOUNTER — Ambulatory Visit (HOSPITAL_COMMUNITY): Payer: Medicaid Other

## 2021-03-12 ENCOUNTER — Other Ambulatory Visit: Payer: Self-pay | Admitting: Family Medicine

## 2021-03-12 DIAGNOSIS — K219 Gastro-esophageal reflux disease without esophagitis: Secondary | ICD-10-CM

## 2021-03-16 ENCOUNTER — Other Ambulatory Visit: Payer: Self-pay | Admitting: Family Medicine

## 2021-03-16 DIAGNOSIS — M62838 Other muscle spasm: Secondary | ICD-10-CM

## 2021-03-17 MED ORDER — CYCLOBENZAPRINE HCL 10 MG PO TABS: ORAL_TABLET | ORAL | 6 refills | Status: DC

## 2021-03-24 ENCOUNTER — Other Ambulatory Visit: Payer: Self-pay | Admitting: Family Medicine

## 2021-03-24 DIAGNOSIS — H60391 Other infective otitis externa, right ear: Secondary | ICD-10-CM | POA: Diagnosis not present

## 2021-03-24 DIAGNOSIS — H6690 Otitis media, unspecified, unspecified ear: Secondary | ICD-10-CM

## 2021-03-24 DIAGNOSIS — H9201 Otalgia, right ear: Secondary | ICD-10-CM | POA: Diagnosis not present

## 2021-07-06 ENCOUNTER — Other Ambulatory Visit: Payer: Self-pay

## 2021-07-06 DIAGNOSIS — K219 Gastro-esophageal reflux disease without esophagitis: Secondary | ICD-10-CM

## 2021-07-06 MED ORDER — PANTOPRAZOLE SODIUM 40 MG PO TBEC: DELAYED_RELEASE_TABLET | ORAL | 0 refills | Status: DC

## 2021-08-05 ENCOUNTER — Other Ambulatory Visit: Payer: Self-pay

## 2021-08-05 DIAGNOSIS — K219 Gastro-esophageal reflux disease without esophagitis: Secondary | ICD-10-CM

## 2021-08-05 MED ORDER — PANTOPRAZOLE SODIUM 40 MG PO TBEC: DELAYED_RELEASE_TABLET | ORAL | 3 refills | Status: DC

## 2021-12-01 ENCOUNTER — Ambulatory Visit: Payer: Medicaid Other | Admitting: Nurse Practitioner

## 2021-12-01 ENCOUNTER — Other Ambulatory Visit: Payer: Self-pay

## 2021-12-01 ENCOUNTER — Encounter: Payer: Self-pay | Admitting: Nurse Practitioner

## 2021-12-01 VITALS — BP 123/79 | HR 100 | Temp 97.9°F | Ht 64.0 in | Wt 192.0 lb

## 2021-12-01 DIAGNOSIS — Z0000 Encounter for general adult medical examination without abnormal findings: Secondary | ICD-10-CM | POA: Diagnosis not present

## 2021-12-01 DIAGNOSIS — G894 Chronic pain syndrome: Secondary | ICD-10-CM | POA: Diagnosis not present

## 2021-12-01 DIAGNOSIS — M25512 Pain in left shoulder: Secondary | ICD-10-CM | POA: Diagnosis not present

## 2021-12-01 DIAGNOSIS — M545 Low back pain, unspecified: Secondary | ICD-10-CM | POA: Diagnosis not present

## 2021-12-01 DIAGNOSIS — G8929 Other chronic pain: Secondary | ICD-10-CM | POA: Diagnosis not present

## 2021-12-01 LAB — POCT GLYCOSYLATED HEMOGLOBIN (HGB A1C)
HbA1c POC (<> result, manual entry): 6.3 % (ref 4.0–5.6)
HbA1c, POC (controlled diabetic range): 6.3 % (ref 0.0–7.0)
HbA1c, POC (prediabetic range): 6.3 % (ref 5.7–6.4)
Hemoglobin A1C: 6.3 % — AB (ref 4.0–5.6)

## 2021-12-01 MED ORDER — LIDOCAINE 4 % EX PTCH: 1.0000 | MEDICATED_PATCH | CUTANEOUS | 0 refills | Status: AC

## 2021-12-01 MED ORDER — MELOXICAM 15 MG PO TABS: 15.0000 mg | ORAL_TABLET | Freq: Every day | ORAL | 2 refills | Status: DC

## 2021-12-01 NOTE — Patient Instructions (Signed)
You were seen today in the [facility] for left shoulder pain and follow up. Labs were collected, results will be available via MyChart or, if abnormal, you will be contacted by clinic staff. You were prescribed medications, please take as directed. Please follow up in 3 mths for annual wellness exam.

## 2021-12-01 NOTE — Progress Notes (Signed)
[facility] [place], [place] [address]  [address] Phone:  [phone]   Fax:  [phone] Subjective:   Patient ID: Brianna Velez, female    DOB: 10-02-1976, 45 y.o.   MRN: [id number]  Chief Complaint  Patient presents with   Follow-up    Pt is here for follow up visit. Pt  has been having shoulder pain for about 3 years been taken muscle relaxer and ibuprofen. Pt need a refill on her meloxicam. Pt stated she hasn't been taking her metformin and she has been feeling dizziness    HPI Brianna Velez 45 y.o. female  has a past medical history of Anxiety and depression, Fibroids, History of 2019 novel coronavirus disease (COVID-19) (12/21/2019), History of hiatal hernia, Insomnia (02/11/2020), Leg pain, Migraine, and Sciatica. To the [facility] for follow up visit and upper back pain.  Patient states that she has had upper back pain, between both shoulders, x 3 yrs. Denies any worsening factors. States that Meloxicam typically works well in managing pain, but she has not taken in several months due to needing refill of medication. Denies any acute changes in pain. Denies any recent trauma or injury. States that she has not taken any new medications for pain. Currently works in Estate manager/land agent at [employer]. Requesting refill of Meloxicam. Denies any numbness and/ tingling.  When questioned about current medications, patient verbalizes discontinuation of metformin due to GI side effects.   Patient also concerned about right jaw pain x 1 wk near right ear. Denies any fever, changes in hearing or drainage from right ear. Pain occurs intermittently. Denies any worsening or improving factors. Endorses having history of dental problems in the right lower tooth, has upcoming schedule appointment with dentist. Question if dental infection could be contributing to pain.   Denies any other complaints today. Denies any fatigue, chest pain, shortness of breath, HA or dizziness. Denies any  blurred vision, numbness or tingling.   Past Medical History:  Diagnosis Date   Anxiety and depression    Fibroids    History of 2019 novel coronavirus disease (COVID-19) 12/21/2019   History of hiatal hernia    Insomnia 02/11/2020   Leg pain    Migraine    Sciatica     Past Surgical History:  Procedure Laterality Date   ABDOMINAL HYSTERECTOMY  09/29/2015   Procedure: HYSTERECTOMY ABDOMINAL;  Surgeon: Mora Bellman, MD;  Location: [facility];  Service: Gynecology;;   BILATERAL SALPINGECTOMY Bilateral 09/29/2015   Procedure: BILATERAL SALPINGECTOMY;  Surgeon: Mora Bellman, MD;  Location: [facility];  Service: Gynecology;  Laterality: Bilateral;   TUBAL LIGATION     WISDOM TOOTH EXTRACTION      Family History  Problem Relation Age of Onset   Diabetes Sister    Heart disease Paternal Grandmother    Hypertension Father    Dementia Mother    Hypertension Mother     Social History   Socioeconomic History   Marital status: Single    Spouse name: Not on file   Number of children: Not on file   Years of education: Not on file   Highest education level: Not on file  Occupational History   Not on file  Tobacco Use   Smoking status: Former    Types: Cigarettes    Quit date: 12/05/1996    Years since quitting: 25.0   Smokeless tobacco: Never  Vaping Use   Vaping Use: Never used  Substance and Sexual Activity   Alcohol use:  No   Drug use: No   Sexual activity: Not Currently    Birth control/protection: Surgical  Other Topics Concern   Not on file  Social History Narrative   Not on file   Social Determinants of Health   Financial Resource Strain: Not on file  Food Insecurity: Not on file  Transportation Needs: Not on file  Physical Activity: Not on file  Stress: Not on file  Social Connections: Not on file  Intimate Partner Violence: Not on file    Outpatient Medications Prior to Visit  Medication Sig Dispense Refill   busPIRone (BUSPAR) 10 MG tablet TAKE 1  TABLET(10 MG) BY MOUTH THREE TIMES DAILY 180 tablet 3   cyclobenzaprine (FLEXERIL) 10 MG tablet Take 1 tablet, by mouth, 3 times a day as needed. 90 tablet 6   DULoxetine (CYMBALTA) 20 MG capsule Take 1 capsule (20 mg total) by mouth daily. 90 capsule 3   furosemide (LASIX) 40 MG tablet TAKE 1 TABLET(40 MG) BY MOUTH TWICE DAILY 60 tablet 3   gabapentin (NEURONTIN) 600 MG tablet TAKE 1 TABLET(600 MG) BY MOUTH THREE TIMES DAILY 270 tablet 3   indomethacin (INDOCIN) 50 MG capsule Take 1 capsule (50 mg total) by mouth 2 (two) times daily with a meal. 60 capsule 6   pantoprazole (PROTONIX) 40 MG tablet TAKE 1 TABLET(40 MG) BY MOUTH TWICE DAILY AS NEEDED 60 tablet 3   meloxicam (MOBIC) 15 MG tablet TAKE 1 TABLET(15 MG) BY MOUTH DAILY 60 tablet 1   metFORMIN (GLUCOPHAGE) 500 MG tablet TAKE 1 TABLET(500 MG) BY MOUTH TWICE DAILY WITH A MEAL (Patient not taking: Reported on 12/01/2021) 180 tablet 3   Multiple Vitamins-Minerals (ONE-A-DAY WOMENS) tablet Take 1 tablet by mouth daily. (Patient not taking: Reported on 12/01/2021)     ofloxacin (FLOXIN) 0.3 % OTIC solution INSTILL 5 DROPS TO RIGHT EAR DAILY (Patient not taking: Reported on 12/01/2021) 5 mL 0   No facility-administered medications prior to visit.    Allergies  Allergen Reactions   Prednisone     Insomnia "it has me up all night"   Hydrocodone Rash    Review of Systems  Constitutional:  Negative for chills, fever and malaise/fatigue.  HENT:         See HPI  Eyes: Negative.   Respiratory:  Negative for cough and shortness of breath.   Cardiovascular:  Negative for chest pain, palpitations and leg swelling.  Gastrointestinal:  Negative for abdominal pain, blood in stool, constipation, diarrhea, nausea and vomiting.  Musculoskeletal:  Positive for back pain.       See HPI  Skin: Negative.   Neurological: Negative.   Psychiatric/Behavioral:  Negative for depression. The patient is not nervous/anxious.   All other systems reviewed and  are negative.     Objective:    Physical Exam Vitals reviewed.  Constitutional:      General: She is not in acute distress.    Appearance: Normal appearance. She is obese.  HENT:     Head: Normocephalic.     Right Ear: Tympanic membrane, ear canal and external ear normal.     Left Ear: Tympanic membrane, ear canal and external ear normal.     Nose: Nose normal.     Mouth/Throat:     Mouth: Mucous membranes are moist.     Comments: Multiple dental caries and missing dentition  Eyes:     Extraocular Movements: Extraocular movements intact.     Conjunctiva/sclera: Conjunctivae normal.  Pupils: Pupils are equal, round, and reactive to light.  Cardiovascular:     Rate and Rhythm: Normal rate and regular rhythm.     Pulses: Normal pulses.     Heart sounds: Normal heart sounds.     Comments: No obvious peripheral edema Pulmonary:     Effort: Pulmonary effort is normal.     Breath sounds: Normal breath sounds.  Musculoskeletal:        General: Tenderness present. No swelling, deformity or signs of injury. Normal range of motion.     Cervical back: Normal range of motion and neck supple.     Comments: Mild tenderness with palpation of the diffuse left scapula  Skin:    General: Skin is warm and dry.     Capillary Refill: Capillary refill takes less than 2 seconds.  Neurological:     General: No focal deficit present.     Mental Status: She is alert and oriented to person, place, and time.  Psychiatric:        Mood and Affect: Mood normal.        Behavior: Behavior normal.        Thought Content: Thought content normal.        Judgment: Judgment normal.    BP 123/79    Pulse 100    Temp 97.9 F (36.6 C)    Ht 5\' 4"  (1.626 m)    Wt 192 lb (87.1 kg)    LMP 08/14/2016    SpO2 100%    BMI 32.96 kg/m  Wt Readings from Last 3 Encounters:  12/01/21 192 lb (87.1 kg)  02/24/21 207 lb (93.9 kg)  01/26/21 207 lb (93.9 kg)    Immunization History  Administered Date(s)  Administered   Influenza,inj,Quad PF,6+ Mos 08/01/2017, 12/09/2017, 09/17/2018, 07/21/2019   Influenza-Unspecified 09/15/2021   Janssen (J&J) SARS-COV-2 Vaccination 04/20/2020   Unspecified SARS-COV-2 Vaccination 12/09/2020    Diabetic Foot Exam - Simple   No data filed     Lab Results  Component Value Date   TSH 0.831 01/26/2021   Lab Results  Component Value Date   WBC 5.8 01/14/2021   HGB 13.2 01/14/2021   HCT 40.7 01/14/2021   MCV 92.5 01/14/2021   PLT 229 01/14/2021   Lab Results  Component Value Date   NA 139 01/14/2021   K 4.4 01/14/2021   CO2 31 01/14/2021   GLUCOSE 140 (H) 01/14/2021   BUN 9 01/14/2021   CREATININE 0.76 01/14/2021   BILITOT 0.3 01/14/2021   ALKPHOS 84 01/14/2021   AST 28 01/14/2021   ALT 40 01/14/2021   PROT 7.9 01/14/2021   ALBUMIN 3.8 01/14/2021   CALCIUM 9.6 01/14/2021   ANIONGAP 10 01/14/2021   Lab Results  Component Value Date   CHOL 216 (H) 01/26/2021   CHOL 207 (H) 02/11/2020   CHOL 217 (H) 02/22/2019   Lab Results  Component Value Date   HDL 45 01/26/2021   HDL 41 02/11/2020   HDL 42 02/22/2019   Lab Results  Component Value Date   LDLCALC 146 (H) 01/26/2021   LDLCALC 130 (H) 02/11/2020   LDLCALC 138 (H) 02/22/2019   Lab Results  Component Value Date   TRIG 137 01/26/2021   TRIG 199 (H) 02/11/2020   TRIG 184 (H) 02/22/2019   Lab Results  Component Value Date   CHOLHDL 4.8 (H) 01/26/2021   CHOLHDL 5.0 (H) 02/11/2020   CHOLHDL 5.2 (H) 02/22/2019   Lab Results  Component Value Date  HGBA1C 6.3 (A) 12/01/2021   HGBA1C 6.3 12/01/2021   HGBA1C 6.3 12/01/2021   HGBA1C 6.3 12/01/2021       Assessment & Plan:   Problem List Items Addressed This Visit       Other   Chronic pain syndrome   Relevant Medications   meloxicam (MOBIC) 15 MG tablet   Chronic left-sided low back pain without sciatica   Relevant Medications   meloxicam (MOBIC) 15 MG tablet   Other Visit Diagnoses     Healthcare maintenance     -  Primary   Relevant Orders   POCT glycosylated hemoglobin (Hb A1C) (Completed): 6.3, improved, will discontinue metformin and reevaluate need for medication at follow up  Encouraged continued diet and exercise efforts  Encouraged continued compliance with medication     Chronic left shoulder pain       Relevant Medications   meloxicam (MOBIC) 15 MG tablet   Lidocaine (HM LIDOCAINE PATCH) 4 % PTCH Discussed non pharmacological methods for management of chronic pain   Follow up in 3 mths for annual wellness visit, sooner as needed    I have discontinued Elza Stalling's meloxicam. I am also having her start on meloxicam and Lidocaine. Additionally, I am having her maintain her One-A-Day Womens, furosemide, indomethacin, busPIRone, DULoxetine, metFORMIN, gabapentin, cyclobenzaprine, ofloxacin, and pantoprazole.  Meds ordered this encounter  Medications   meloxicam (MOBIC) 15 MG tablet    Sig: Take 1 tablet (15 mg total) by mouth daily.    Dispense:  30 tablet    Refill:  2   Lidocaine (HM LIDOCAINE PATCH) 4 % PTCH    Sig: Apply 1 patch topically daily for 14 days.    Dispense:  15 patch    Refill:  0     Teena Dunk, NP

## 2022-02-14 ENCOUNTER — Encounter (HOSPITAL_COMMUNITY): Payer: Self-pay | Admitting: Emergency Medicine

## 2022-02-14 ENCOUNTER — Emergency Department (HOSPITAL_COMMUNITY)
Admission: EM | Admit: 2022-02-14 | Discharge: 2022-02-14 | Disposition: A | Discharge status: Home / Self Care | Payer: Medicaid Other | Attending: Emergency Medicine | Admitting: Emergency Medicine

## 2022-02-14 DIAGNOSIS — K0510 Chronic gingivitis, plaque induced: Secondary | ICD-10-CM | POA: Diagnosis not present

## 2022-02-14 DIAGNOSIS — R220 Localized swelling, mass and lump, head: Secondary | ICD-10-CM | POA: Diagnosis not present

## 2022-02-14 DIAGNOSIS — K029 Dental caries, unspecified: Secondary | ICD-10-CM | POA: Insufficient documentation

## 2022-02-14 DIAGNOSIS — K0889 Other specified disorders of teeth and supporting structures: Secondary | ICD-10-CM

## 2022-02-14 MED ORDER — ACETAMINOPHEN 325 MG PO TABS
650.0000 mg | ORAL_TABLET | Freq: Four times a day (QID) | ORAL | Status: DC | PRN
  Administered 2022-02-14: 650 mg via ORAL
  Filled 2022-02-14: qty 2

## 2022-02-14 NOTE — Discharge Instructions (Addendum)
Please follow-up with your dentist today as planned.  Take any Tylenol or ibuprofen for pain.  If your discomfort continues, continue to follow with your dentist and primary care provider. ?

## 2022-02-14 NOTE — ED Triage Notes (Signed)
Patient here complaining of left facial swelling that was present on waking this morning, reports having a tooth pulled from left upper part of mouth. Patient speaking in complete sentences and in no apparent distress at this time. ?

## 2022-02-14 NOTE — ED Provider Notes (Signed)
?[name] ?Provider Note ? ? ?CSN: [id number] ?Arrival date & time: 02/14/22  1121 ? ?  ? ?History ? ?Chief Complaint  ?Patient presents with  ? Facial Swelling  ? ? ?Brianna Velez is a 46 y.o. female with a past medical history of chronic pain syndrome presenting today with left-sided facial pain and swelling.  Reports that it started when she woke up this morning.  2 weeks ago she had a tooth pulled on her left upper side and has had some pain since then however it is worse this morning.  No trismus or difficulty swallowing or breathing.  Pain with chewing on that side.  No radiation of pain, fevers or chills are noted sore throat.  Says that her meloxicam helped her pain ? ?Home Medications ?Prior to Admission medications   ?Medication Sig Start Date End Date Taking? Authorizing Provider  ?busPIRone (BUSPAR) 10 MG tablet TAKE 1 TABLET(10 MG) BY MOUTH THREE TIMES DAILY 11/12/20   Azzie Glatter, FNP  ?cyclobenzaprine (FLEXERIL) 10 MG tablet Take 1 tablet, by mouth, 3 times a day as needed. 03/17/21   Azzie Glatter, FNP  ?DULoxetine (CYMBALTA) 20 MG capsule Take 1 capsule (20 mg total) by mouth daily. 12/30/20   Azzie Glatter, FNP  ?furosemide (LASIX) 40 MG tablet TAKE 1 TABLET(40 MG) BY MOUTH TWICE DAILY 04/10/20   Azzie Glatter, FNP  ?gabapentin (NEURONTIN) 600 MG tablet TAKE 1 TABLET(600 MG) BY MOUTH THREE TIMES DAILY 02/08/21   Azzie Glatter, FNP  ?indomethacin (INDOCIN) 50 MG capsule Take 1 capsule (50 mg total) by mouth 2 (two) times daily with a meal. 05/13/20   Azzie Glatter, FNP  ?meloxicam (MOBIC) 15 MG tablet Take 1 tablet (15 mg total) by mouth daily. 12/01/21 03/01/22  Bo Merino I, NP  ?metFORMIN (GLUCOPHAGE) 500 MG tablet TAKE 1 TABLET(500 MG) BY MOUTH TWICE DAILY WITH A MEAL ?Patient not taking: Reported on 12/01/2021 01/20/21   Azzie Glatter, FNP  ?Multiple Vitamins-Minerals (ONE-A-DAY WOMENS) tablet Take 1 tablet by mouth  daily. ?Patient not taking: Reported on 12/01/2021    [provider]  ?ofloxacin (FLOXIN) 0.3 % OTIC solution INSTILL 5 DROPS TO RIGHT EAR DAILY ?Patient not taking: Reported on 12/01/2021 03/31/21   Vevelyn Francois, NP  ?pantoprazole (PROTONIX) 40 MG tablet TAKE 1 TABLET(40 MG) BY MOUTH TWICE DAILY AS NEEDED 08/05/21   Vevelyn Francois, NP  ?   ? ?Allergies    ?Prednisone and Hydrocodone   ? ?Review of Systems   ?Review of Systems ?See HPI ?Physical Exam ?Updated Vital Signs ?BP (!) 143/104 (BP Location: Right Arm)   Pulse (!) 107   Temp 99.3 ?F (37.4 ?C) (Oral)   Resp 18   LMP 08/14/2016   SpO2 97%  ?Physical Exam ?Vitals and nursing note reviewed.  ?Constitutional:   ?   Appearance: Normal appearance.  ?HENT:  ?   Head: Normocephalic and atraumatic.  ?   Mouth/Throat:  ?   Mouth: Mucous membranes are moist.  ?   Pharynx: Oropharynx is clear. No oropharyngeal exudate or posterior oropharyngeal erythema.  ?   Comments: No signs of PTA.  No lymphadenopathy.   ? ?Some erythema and inflammation along the right gumline.  No signs of infection around the site of extraction of the left upper molar.  Caries with associated gingivitis on the second left lower molar ?Eyes:  ?   General: No scleral icterus. ?   Conjunctiva/sclera:  Conjunctivae normal.  ?Pulmonary:  ?   Effort: Pulmonary effort is normal. No respiratory distress.  ?Lymphadenopathy:  ?   Cervical: No cervical adenopathy.  ?Skin: ?   General: Skin is warm and dry.  ?   Findings: No rash.  ?Neurological:  ?   Mental Status: She is alert.  ?Psychiatric:     ?   Mood and Affect: Mood normal.  ? ? ?ED Results / Procedures / Treatments   ?Labs ?(all labs ordered are listed, but only abnormal results are displayed) ?Labs Reviewed - No data to display ? ?EKG ?None ? ?Radiology ?No results found. ? ?Procedures ?Procedures  ? ?Medications Ordered in ED ?Medications  ?acetaminophen (TYLENOL) tablet 650 mg (650 mg Oral Given 02/14/22 1207)  ? ? ?ED Course/  Medical Decision Making/ A&P ?  ?                        ?Medical Decision Making ?Risk ?OTC drugs. ? ? ?46 year old female presenting with facial swelling and pain in her mouth.  Recently had a tooth extracted.  Denies any trismus, shortness of breath or difficulty swallowing.  No fevers or chills. ? ?Physical exam: Patient with some gingivitis and a potentially infected left lower molar with caries.  No signs of PTA, tolerating secretions and airway patent. ? ?Disposition: Prior to discharge, patient got in contact with her dentist who would like to see her at 2 PM today.  Patient discharged after being given Tylenol in the department.  Antibiotic treatment deferred to dentist.  She will continue taking meloxicam which she says helped her. ? ? ?Final Clinical Impression(s) / ED Diagnoses ?Final diagnoses:  ?Facial swelling  ?Pain, dental  ? ? ?Rx / DC Orders ?Results and diagnoses were explained to the patient. Return precautions discussed in full. Patient had no additional questions and expressed complete understanding. ? ? ?This chart was dictated using voice recognition software.  Despite best efforts to proofread,  errors can occur which can change the documentation meaning.  ?  ?Rhae Hammock, PA-C ?02/14/22 1306 ? ?  ?Regan Lemming, MD ?02/14/22 1326 ? ?

## 2022-02-14 NOTE — ED Provider Triage Note (Signed)
Emergency Medicine Provider Triage Evaluation Note ? ?Brianna Velez , a 46 y.o. female  was evaluated in triage.  Pt complains of left-sided facial swelling and pain that began this morning.  Patient had a tooth pulled in her left upper mouth 2 weeks ago.  When this pain started this morning she took a meloxicam which helped her.  No fevers or chills.  No difficulty breathing or swallowing. ? ?Review of Systems  ?As above ? ?Physical Exam  ?BP (!) 143/104 (BP Location: Right Arm)   Pulse (!) 107   Temp 99.3 ?F (37.4 ?C) (Oral)   Resp 18   LMP 08/14/2016   SpO2 97%  ?Gen:   Awake, no distress   ?Resp:  Normal effort  ?MSK:   Moves extremities without difficulty  ?Other:  No signs of infection around the site of tooth extraction.  No abscess or drainage.  Very mild soft tissue inflammation ? ?Medical Decision Making  ?Medically screening exam initiated at 11:37 AM.  Appropriate orders placed.  Brianna Velez was informed that the remainder of the evaluation will be completed by another provider, this initial triage assessment does not replace that evaluation, and the importance of remaining in the ED until their evaluation is complete. ? ? ?  ?Rhae Hammock, PA-C ?02/14/22 1138 ? ?

## 2022-02-15 ENCOUNTER — Telehealth: Payer: Self-pay

## 2022-02-15 NOTE — Telephone Encounter (Signed)
Transition Care Management Follow-up Telephone Call ?Date of discharge and from where: 02/14/2022 from [facility] ?How have you been since you were released from the hospital? Patient stated that she is feeling the same. Patient stated that she saw the dentist and they gave her an abx rx.  ?Any questions or concerns? No ? ?Items Reviewed: ?Did the pt receive and understand the discharge instructions provided? Yes  ?Medications obtained and verified? Yes  ?Other? No  ?Any new allergies since your discharge? No  ?Dietary orders reviewed? No ?Do you have support at home? Yes  ? ?Functional Questionnaire: (I = Independent and D = Dependent) ?ADLs: I ? ?Bathing/Dressing- I ? ?Meal Prep- I ? ?Eating- I ? ?Maintaining continence- I ? ?Transferring/Ambulation- I ? ?Managing Meds- I ? ? ?Follow up appointments reviewed: ? ?PCP Hospital f/u appt confirmed? No  ?Specialist Hospital f/u appt confirmed? Yes  Patient saw dentist yesterday.  ?Are transportation arrangements needed? No  ?If their condition worsens, is the pt aware to call PCP or go to the Emergency Dept.? Yes ?Was the patient provided with contact information for the PCP's office or ED? Yes ?Was to pt encouraged to call back with questions or concerns? Yes ? ?

## 2022-02-26 ENCOUNTER — Other Ambulatory Visit: Payer: Self-pay | Admitting: Nurse Practitioner

## 2022-02-26 DIAGNOSIS — G8929 Other chronic pain: Secondary | ICD-10-CM

## 2022-02-26 DIAGNOSIS — G894 Chronic pain syndrome: Secondary | ICD-10-CM

## 2022-03-01 ENCOUNTER — Ambulatory Visit: Payer: Medicaid Other | Admitting: Nurse Practitioner

## 2022-03-02 ENCOUNTER — Other Ambulatory Visit: Payer: Self-pay

## 2022-03-02 ENCOUNTER — Encounter: Payer: Self-pay | Admitting: Nurse Practitioner

## 2022-03-02 ENCOUNTER — Ambulatory Visit: Payer: Medicaid Other | Admitting: Nurse Practitioner

## 2022-03-02 VITALS — BP 117/82 | HR 97 | Temp 97.2°F | Ht 64.0 in | Wt 184.2 lb

## 2022-03-02 DIAGNOSIS — R7303 Prediabetes: Secondary | ICD-10-CM | POA: Diagnosis not present

## 2022-03-02 DIAGNOSIS — Z0001 Encounter for general adult medical examination with abnormal findings: Secondary | ICD-10-CM | POA: Diagnosis not present

## 2022-03-02 DIAGNOSIS — R232 Flushing: Secondary | ICD-10-CM | POA: Diagnosis not present

## 2022-03-02 NOTE — Progress Notes (Signed)
? ?[facility] ?[place] [place], [place]  [address] ?Phone:  [phone]   Fax:  [phone] ?Subjective:  ? Patient ID: Brianna Velez, female    DOB: [birth year], 46 y.o.   MRN: [id number] ? ?Chief Complaint  ?Patient presents with  ? Follow-up  ?  Pt is here for 3 month follow up visit. Pt stated she has hot flashes and its overwhelming also Pt stated her hands jerks random   ? ?HPI ?Brianna Velez 45 y.o. female  has a past medical history of Anxiety and depression, Fibroids, History of 2019 novel coronavirus disease (COVID-19) (12/21/2019), History of hiatal hernia, Insomnia (02/11/2020), Leg pain, Migraine, and Sciatica. To the [facility] for wellness visit. ? ?Concerned today about hot flashes. States that she has had for two years without treatment. Has not taken any OTC medications for symptoms.Had hysterectomy in 2016 due to tumors. Has not had a menstrual cycle since completion of surgery. Also concerned about frequent tremors in bilateral hands x 2 wks. States that tremors occur intermittently throughout day, denies any factors that worsen or improve symptoms. Currently works at the [employer]. Denies any other concerns today. ? ?Currently does not monitor diet and/ exercise regularly. States that her busy schedule affects eating habits. Denies any fatigue, chest pain, shortness of breath, HA or dizziness. Denies any blurred vision, numbness or tingling. ? ?Past Medical History:  ?Diagnosis Date  ? Anxiety and depression   ? Fibroids   ? History of 2019 novel coronavirus disease (COVID-19) 12/21/2019  ? History of hiatal hernia   ? Insomnia 02/11/2020  ? Leg pain   ? Migraine   ? Sciatica   ? ? ?Past Surgical History:  ?Procedure Laterality Date  ? ABDOMINAL HYSTERECTOMY  09/29/2015  ? Procedure: HYSTERECTOMY ABDOMINAL;  Surgeon: Mora Bellman, MD;  Location: [facility];  Service: Gynecology;;  ? BILATERAL SALPINGECTOMY Bilateral 09/29/2015  ? Procedure: BILATERAL  SALPINGECTOMY;  Surgeon: Mora Bellman, MD;  Location: [facility];  Service: Gynecology;  Laterality: Bilateral;  ? TUBAL LIGATION    ? WISDOM TOOTH EXTRACTION    ? ? ?Family History  ?Problem Relation Age of Onset  ? Diabetes Sister   ? Heart disease Paternal Grandmother   ? Hypertension Father   ? Dementia Mother   ? Hypertension Mother   ? ? ?Social History  ? ?Socioeconomic History  ? Marital status: Single  ?  Spouse name: Not on file  ? Number of children: Not on file  ? Years of education: Not on file  ? Highest education level: Not on file  ?Occupational History  ? Not on file  ?Tobacco Use  ? Smoking status: Former  ?  Types: Cigarettes  ?  Quit date: 12/05/1996  ?  Years since quitting: 25.2  ? Smokeless tobacco: Never  ?Vaping Use  ? Vaping Use: Never used  ?Substance and Sexual Activity  ? Alcohol use: No  ? Drug use: No  ? Sexual activity: Not Currently  ?  Birth control/protection: Surgical  ?Other Topics Concern  ? Not on file  ?Social History Narrative  ? Not on file  ? ?Social Determinants of Health  ? ?Financial Resource Strain: Not on file  ?Food Insecurity: Not on file  ?Transportation Needs: Not on file  ?Physical Activity: Not on file  ?Stress: Not on file  ?Social Connections: Not on file  ?Intimate Partner Violence: Not on file  ? ? ?Outpatient Medications Prior to Visit  ?Medication Sig  Dispense Refill  ? busPIRone (BUSPAR) 10 MG tablet TAKE 1 TABLET(10 MG) BY MOUTH THREE TIMES DAILY 180 tablet 3  ? cyclobenzaprine (FLEXERIL) 10 MG tablet Take 1 tablet, by mouth, 3 times a day as needed. 90 tablet 6  ? DULoxetine (CYMBALTA) 20 MG capsule Take 1 capsule (20 mg total) by mouth daily. 90 capsule 3  ? gabapentin (NEURONTIN) 600 MG tablet TAKE 1 TABLET(600 MG) BY MOUTH THREE TIMES DAILY 270 tablet 3  ? indomethacin (INDOCIN) 50 MG capsule Take 1 capsule (50 mg total) by mouth 2 (two) times daily with a meal. 60 capsule 6  ? meloxicam (MOBIC) 15 MG tablet TAKE 1 TABLET(15 MG) BY MOUTH DAILY 30 tablet 2   ? pantoprazole (PROTONIX) 40 MG tablet TAKE 1 TABLET(40 MG) BY MOUTH TWICE DAILY AS NEEDED 60 tablet 3  ? furosemide (LASIX) 40 MG tablet TAKE 1 TABLET(40 MG) BY MOUTH TWICE DAILY (Patient not taking: Reported on 03/02/2022) 60 tablet 3  ? metFORMIN (GLUCOPHAGE) 500 MG tablet TAKE 1 TABLET(500 MG) BY MOUTH TWICE DAILY WITH A MEAL (Patient not taking: Reported on 12/01/2021) 180 tablet 3  ? Multiple Vitamins-Minerals (ONE-A-DAY WOMENS) tablet Take 1 tablet by mouth daily. (Patient not taking: Reported on 12/01/2021)    ? ofloxacin (FLOXIN) 0.3 % OTIC solution INSTILL 5 DROPS TO RIGHT EAR DAILY 5 mL 0  ? ?No facility-administered medications prior to visit.  ? ? ?Allergies  ?Allergen Reactions  ? Prednisone   ?  Insomnia "it has me up all night"  ? Hydrocodone Rash  ? ? ?Review of Systems  ?Constitutional:  Negative for chills, fever and malaise/fatigue.  ?HENT: Negative.    ?Eyes: Negative.   ?Respiratory:  Negative for cough and shortness of breath.   ?Cardiovascular:  Negative for chest pain, palpitations and leg swelling.  ?Gastrointestinal:  Negative for abdominal pain, blood in stool, constipation, diarrhea, nausea and vomiting.  ?Genitourinary: Negative.   ?Musculoskeletal: Negative.   ?Skin: Negative.   ?Neurological: Negative.   ?Psychiatric/Behavioral:  Negative for depression. The patient is not nervous/anxious.   ?All other systems reviewed and are negative. ? ?   ?Objective:  ?  ?Physical Exam ?Vitals reviewed.  ?Constitutional:   ?   General: She is not in acute distress. ?   Appearance: Normal appearance. She is obese.  ?HENT:  ?   Head: Normocephalic.  ?   Right Ear: Tympanic membrane, ear canal and external ear normal. There is no impacted cerumen.  ?   Left Ear: Tympanic membrane, ear canal and external ear normal. There is no impacted cerumen.  ?   Nose: Nose normal. No congestion or rhinorrhea.  ?   Mouth/Throat:  ?   Mouth: Mucous membranes are moist.  ?   Pharynx: Oropharynx is clear. No  oropharyngeal exudate or posterior oropharyngeal erythema.  ?Eyes:  ?   General: No scleral icterus.    ?   Right eye: No discharge.     ?   Left eye: No discharge.  ?   Extraocular Movements: Extraocular movements intact.  ?   Conjunctiva/sclera: Conjunctivae normal.  ?   Pupils: Pupils are equal, round, and reactive to light.  ?Neck:  ?   Vascular: No carotid bruit.  ?Cardiovascular:  ?   Rate and Rhythm: Normal rate and regular rhythm.  ?   Pulses: Normal pulses.  ?   Heart sounds: Normal heart sounds.  ?   Comments: No obvious peripheral edema ?Pulmonary:  ?   Effort:  Pulmonary effort is normal.  ?   Breath sounds: Normal breath sounds.  ?Abdominal:  ?   General: Abdomen is flat. Bowel sounds are normal. There is no distension.  ?   Palpations: Abdomen is soft. There is no mass.  ?   Tenderness: There is no abdominal tenderness. There is no right CVA tenderness, left CVA tenderness, guarding or rebound.  ?   Hernia: No hernia is present.  ?Musculoskeletal:     ?   General: No swelling, tenderness, deformity or signs of injury. Normal range of motion.  ?   Cervical back: Normal range of motion and neck supple. No rigidity or tenderness.  ?   Right lower leg: No edema.  ?   Left lower leg: No edema.  ?Lymphadenopathy:  ?   Cervical: No cervical adenopathy.  ?Skin: ?   General: Skin is warm and dry.  ?   Capillary Refill: Capillary refill takes less than 2 seconds.  ?Neurological:  ?   General: No focal deficit present.  ?   Mental Status: She is alert and oriented to person, place, and time.  ?Psychiatric:     ?   Mood and Affect: Mood normal.     ?   Behavior: Behavior normal.     ?   Thought Content: Thought content normal.     ?   Judgment: Judgment normal.  ? ? ?BP 117/82 (BP Location: Right Arm, Patient Position: Sitting, Cuff Size: Normal)   Pulse 97   Temp (!) 97.2 ?F (36.2 ?C)   Ht '5\' 4"'$  (1.626 m)   Wt 184 lb 3.2 oz (83.6 kg)   LMP 08/14/2016   SpO2 98%   BMI 31.62 kg/m?  ?Wt Readings from Last 3  Encounters:  ?03/02/22 184 lb 3.2 oz (83.6 kg)  ?12/01/21 192 lb (87.1 kg)  ?02/24/21 207 lb (93.9 kg)  ? ? ?Immunization History  ?Administered Date(s) Administered  ? Influenza,inj,Quad PF,6+ Mos 08/01/2017, 12/09/2017, 10/

## 2022-03-02 NOTE — Patient Instructions (Signed)
You were seen today in the [facility] for wellness visit and hot flashes. Labs were collected, results will be available via MyChart or, if abnormal, you will be contacted by clinic staff.  Please follow up in 3 mths for reevaluation estrogen and hot flashes. ?

## 2022-03-06 LAB — COMPREHENSIVE METABOLIC PANEL
ALT: 22 IU/L (ref 0–32)
AST: 29 IU/L (ref 0–40)
Albumin/Globulin Ratio: 1.4 (ref 1.2–2.2)
Albumin: 4.6 g/dL (ref 3.8–4.8)
Alkaline Phosphatase: 102 IU/L (ref 44–121)
BUN/Creatinine Ratio: 12 (ref 9–23)
BUN: 10 mg/dL (ref 6–24)
Bilirubin Total: 0.2 mg/dL (ref 0.0–1.2)
CO2: 24 mmol/L (ref 20–29)
Calcium: 9.5 mg/dL (ref 8.7–10.2)
Chloride: 101 mmol/L (ref 96–106)
Creatinine, Ser: 0.83 mg/dL (ref 0.57–1.00)
Globulin, Total: 3.3 g/dL (ref 1.5–4.5)
Glucose: 102 mg/dL — ABNORMAL HIGH (ref 70–99)
Potassium: 4.9 mmol/L (ref 3.5–5.2)
Sodium: 140 mmol/L (ref 134–144)
Total Protein: 7.9 g/dL (ref 6.0–8.5)
eGFR: 87 mL/min/{1.73_m2} (ref >59)

## 2022-03-06 LAB — LIPID PANEL
Chol/HDL Ratio: 5.9 ratio — ABNORMAL HIGH (ref 0.0–4.4)
Cholesterol, Total: 234 mg/dL — ABNORMAL HIGH (ref 100–199)
HDL: 40 mg/dL (ref >39)
LDL Chol Calc (NIH): 150 mg/dL — ABNORMAL HIGH (ref 0–99)
Triglycerides: 240 mg/dL — ABNORMAL HIGH (ref 0–149)
VLDL Cholesterol Cal: 44 mg/dL — ABNORMAL HIGH (ref 5–40)

## 2022-03-06 LAB — CBC WITH DIFFERENTIAL/PLATELET
Basophils Absolute: 0 10*3/uL (ref 0.0–0.2)
Basos: 0 %
EOS (ABSOLUTE): 0.1 10*3/uL (ref 0.0–0.4)
Eos: 1 %
Hematocrit: 40 % (ref 34.0–46.6)
Hemoglobin: 13.2 g/dL (ref 11.1–15.9)
Immature Grans (Abs): 0 10*3/uL (ref 0.0–0.1)
Immature Granulocytes: 0 %
Lymphocytes Absolute: 2.3 10*3/uL (ref 0.7–3.1)
Lymphs: 49 %
MCH: 30.4 pg (ref 26.6–33.0)
MCHC: 33 g/dL (ref 31.5–35.7)
MCV: 92 fL (ref 79–97)
Monocytes Absolute: 0.5 10*3/uL (ref 0.1–0.9)
Monocytes: 10 %
Neutrophils Absolute: 2 10*3/uL (ref 1.4–7.0)
Neutrophils: 40 %
Platelets: 239 10*3/uL (ref 150–450)
RBC: 4.34 x10E6/uL (ref 3.77–5.28)
RDW: 11.8 % (ref 11.7–15.4)
WBC: 4.9 10*3/uL (ref 3.4–10.8)

## 2022-03-06 LAB — HEMOGLOBIN A1C
Est. average glucose Bld gHb Est-mCnc: 146 mg/dL
Hgb A1c MFr Bld: 6.7 % — ABNORMAL HIGH (ref 4.8–5.6)

## 2022-03-06 LAB — ESTROGENS, TOTAL: Estrogen: 149 pg/mL

## 2022-03-07 ENCOUNTER — Other Ambulatory Visit: Payer: Self-pay | Admitting: Nurse Practitioner

## 2022-03-07 DIAGNOSIS — E2839 Other primary ovarian failure: Secondary | ICD-10-CM

## 2022-03-07 DIAGNOSIS — E7849 Other hyperlipidemia: Secondary | ICD-10-CM

## 2022-03-07 MED ORDER — ESTRADIOL 0.025 MG/24HR TD PTWK: 0.0250 mg | MEDICATED_PATCH | TRANSDERMAL | 2 refills | Status: DC

## 2022-03-07 MED ORDER — OMEGA-3-ACID ETHYL ESTERS 1 G PO CAPS: 2.0000 g | ORAL_CAPSULE | Freq: Every day | ORAL | 2 refills | Status: DC

## 2022-03-17 ENCOUNTER — Other Ambulatory Visit: Payer: Self-pay

## 2022-03-17 DIAGNOSIS — M62838 Other muscle spasm: Secondary | ICD-10-CM

## 2022-03-18 MED ORDER — CYCLOBENZAPRINE HCL 10 MG PO TABS: ORAL_TABLET | ORAL | 6 refills | Status: DC

## 2022-05-09 ENCOUNTER — Other Ambulatory Visit: Payer: Self-pay

## 2022-05-09 DIAGNOSIS — M792 Neuralgia and neuritis, unspecified: Secondary | ICD-10-CM

## 2022-05-09 DIAGNOSIS — R232 Flushing: Secondary | ICD-10-CM

## 2022-05-09 MED ORDER — GABAPENTIN 600 MG PO TABS: ORAL_TABLET | ORAL | 0 refills | Status: DC

## 2022-05-26 ENCOUNTER — Telehealth: Payer: Self-pay

## 2022-05-26 ENCOUNTER — Other Ambulatory Visit: Payer: Self-pay

## 2022-05-26 DIAGNOSIS — M792 Neuralgia and neuritis, unspecified: Secondary | ICD-10-CM

## 2022-05-26 DIAGNOSIS — G894 Chronic pain syndrome: Secondary | ICD-10-CM

## 2022-05-26 DIAGNOSIS — K219 Gastro-esophageal reflux disease without esophagitis: Secondary | ICD-10-CM

## 2022-05-26 DIAGNOSIS — F32A Depression, unspecified: Secondary | ICD-10-CM

## 2022-05-26 DIAGNOSIS — R232 Flushing: Secondary | ICD-10-CM

## 2022-05-26 DIAGNOSIS — G8929 Other chronic pain: Secondary | ICD-10-CM

## 2022-05-26 MED ORDER — DULOXETINE HCL 20 MG PO CPEP: 20.0000 mg | ORAL_CAPSULE | Freq: Every day | ORAL | 3 refills | Status: DC

## 2022-05-26 MED ORDER — MELOXICAM 15 MG PO TABS: ORAL_TABLET | ORAL | 2 refills | Status: DC

## 2022-05-26 MED ORDER — PANTOPRAZOLE SODIUM 40 MG PO TBEC: DELAYED_RELEASE_TABLET | ORAL | 3 refills | Status: DC

## 2022-05-26 NOTE — Telephone Encounter (Signed)
Acid reflux Cymbalta Meloxicam   To be refilled   [facility] on [address]

## 2022-05-31 ENCOUNTER — Encounter: Payer: Self-pay | Admitting: Nurse Practitioner

## 2022-05-31 ENCOUNTER — Ambulatory Visit (INDEPENDENT_AMBULATORY_CARE_PROVIDER_SITE_OTHER): Payer: Medicaid Other | Admitting: Nurse Practitioner

## 2022-05-31 VITALS — BP 126/74 | HR 91 | Temp 98.1°F | Wt 191.2 lb

## 2022-05-31 DIAGNOSIS — R232 Flushing: Secondary | ICD-10-CM | POA: Diagnosis not present

## 2022-05-31 DIAGNOSIS — E7849 Other hyperlipidemia: Secondary | ICD-10-CM

## 2022-05-31 DIAGNOSIS — K219 Gastro-esophageal reflux disease without esophagitis: Secondary | ICD-10-CM

## 2022-05-31 DIAGNOSIS — E2839 Other primary ovarian failure: Secondary | ICD-10-CM | POA: Diagnosis not present

## 2022-05-31 MED ORDER — PANTOPRAZOLE SODIUM 40 MG PO TBEC: DELAYED_RELEASE_TABLET | ORAL | 3 refills | Status: DC

## 2022-05-31 MED ORDER — ESTRADIOL 1 MG PO TABS: 1.0000 mg | ORAL_TABLET | Freq: Every day | ORAL | 2 refills | Status: DC

## 2022-05-31 MED ORDER — OMEGA-3-ACID ETHYL ESTERS 1 G PO CAPS: 2.0000 g | ORAL_CAPSULE | Freq: Every day | ORAL | 2 refills | Status: AC

## 2022-05-31 NOTE — Progress Notes (Signed)
[place], [place]  [address] Phone:  [phone]   Fax:  [phone] Subjective:   Patient ID: Brianna Velez, female    DOB: 1975/12/24, 46 y.o.   MRN: [id number]  Chief Complaint  Patient presents with   Follow-up   HPI Brianna Velez 46 y.o. female  has a past medical history of Anxiety and depression, Fibroids, History of 2019 novel coronavirus disease (COVID-19) (12/21/2019), History of hiatal hernia, Insomnia (02/11/2020), Leg pain, Migraine, and Sciatica. To the [facility] for reevaluation of hot flashes and medications.  States that Brianna Velez stopped using the estrogen patch due to lack of adhesiveness. Has not been monitoring meals or exercising regularly, recently had an increase in stressors at home. Continues to have hot flashes and other symptoms. Denies any other concerns today.  Denies any fatigue, chest pain, shortness of breath, HA or dizziness. Denies any blurred vision, numbness or tingling.  Past Medical History:  Diagnosis Date   Anxiety and depression    Fibroids    History of 2019 novel coronavirus disease (COVID-19) 12/21/2019   History of hiatal hernia    Insomnia 02/11/2020   Leg pain    Migraine    Sciatica     Past Surgical History:  Procedure Laterality Date   ABDOMINAL HYSTERECTOMY  09/29/2015   Procedure: HYSTERECTOMY ABDOMINAL;  Surgeon: Mora Bellman, MD;  Location: [facility];  Service: Gynecology;;   BILATERAL SALPINGECTOMY Bilateral 09/29/2015   Procedure: BILATERAL SALPINGECTOMY;  Surgeon: Mora Bellman, MD;  Location: [facility];  Service: Gynecology;  Laterality: Bilateral;   TUBAL LIGATION     WISDOM TOOTH EXTRACTION      Family History  Problem Relation Age of Onset   Diabetes Sister    Heart disease Paternal Grandmother    Hypertension Father    Dementia Mother    Hypertension Mother     Social History   Socioeconomic History   Marital status: Single    Spouse name: Not on file   Number of  children: Not on file   Years of education: Not on file   Highest education level: Not on file  Occupational History   Not on file  Tobacco Use   Smoking status: Former    Types: Cigarettes    Quit date: 12/05/1996    Years since quitting: 25.5   Smokeless tobacco: Never  Vaping Use   Vaping Use: Never used  Substance and Sexual Activity   Alcohol use: No   Drug use: No   Sexual activity: Not Currently    Birth control/protection: Surgical  Other Topics Concern   Not on file  Social History Narrative   Not on file   Social Determinants of Health   Financial Resource Strain: Not on file  Food Insecurity: Not on file  Transportation Needs: Not on file  Physical Activity: Not on file  Stress: Not on file  Social Connections: Not on file  Intimate Partner Violence: Not on file    Outpatient Medications Prior to Visit  Medication Sig Dispense Refill   busPIRone (BUSPAR) 10 MG tablet TAKE 1 TABLET(10 MG) BY MOUTH THREE TIMES DAILY 180 tablet 3   cyclobenzaprine (FLEXERIL) 10 MG tablet Take 1 tablet, by mouth, 3 times a day as needed. 90 tablet 6   DULoxetine (CYMBALTA) 20 MG capsule Take 1 capsule (20 mg total) by mouth daily. 90 capsule 3   gabapentin (NEURONTIN) 600 MG tablet TAKE 1 TABLET(600 MG) BY MOUTH THREE TIMES  DAILY 270 tablet 0   meloxicam (MOBIC) 15 MG tablet TAKE 1 TABLET(15 MG) BY MOUTH DAILY 30 tablet 2   pantoprazole (PROTONIX) 40 MG tablet TAKE 1 TABLET(40 MG) BY MOUTH TWICE DAILY AS NEEDED 60 tablet 3   furosemide (LASIX) 40 MG tablet TAKE 1 TABLET(40 MG) BY MOUTH TWICE DAILY (Patient not taking: Reported on 03/02/2022) 60 tablet 3   indomethacin (INDOCIN) 50 MG capsule Take 1 capsule (50 mg total) by mouth 2 (two) times daily with a meal. 60 capsule 6   metFORMIN (GLUCOPHAGE) 500 MG tablet TAKE 1 TABLET(500 MG) BY MOUTH TWICE DAILY WITH A MEAL (Patient not taking: Reported on 12/01/2021) 180 tablet 3   Multiple Vitamins-Minerals (ONE-A-DAY WOMENS) tablet Take 1  tablet by mouth daily. (Patient not taking: Reported on 05/31/2022)     estradiol (CLIMARA - DOSED IN MG/24 HR) 0.025 mg/24hr patch Place 1 patch (0.025 mg total) onto the skin every 7 (seven) days. 4 patch 2   omega-3 acid ethyl esters (LOVAZA) 1 g capsule Take 2 capsules (2 g total) by mouth daily. 60 capsule 2   No facility-administered medications prior to visit.    Allergies  Allergen Reactions   Prednisone     Insomnia "it has me up all night"   Hydrocodone Rash    Review of Systems  Constitutional:  Negative for chills, fever and malaise/fatigue.       See HPI  Respiratory:  Negative for cough and shortness of breath.   Cardiovascular:  Negative for chest pain, palpitations and leg swelling.  Gastrointestinal:  Negative for abdominal pain, blood in stool, constipation, diarrhea, nausea and vomiting.  Skin: Negative.   Neurological: Negative.   Psychiatric/Behavioral:  Negative for depression. The patient is not nervous/anxious.   All other systems reviewed and are negative.      Objective:    Physical Exam Constitutional:      General: Brianna Velez is not in acute distress.    Appearance: Normal appearance. Brianna Velez is obese.  HENT:     Head: Normocephalic.  Neck:     Vascular: No carotid bruit.  Cardiovascular:     Rate and Rhythm: Normal rate and regular rhythm.     Pulses: Normal pulses.     Heart sounds: Normal heart sounds.     Comments: No obvious peripheral edema Pulmonary:     Effort: Pulmonary effort is normal.     Breath sounds: Normal breath sounds.  Musculoskeletal:        General: No swelling, tenderness, deformity or signs of injury. Normal range of motion.     Cervical back: Normal range of motion and neck supple. No rigidity or tenderness.     Right lower leg: No edema.     Left lower leg: No edema.  Lymphadenopathy:     Cervical: No cervical adenopathy.  Skin:    General: Skin is warm and dry.     Capillary Refill: Capillary refill takes less than 2  seconds.  Neurological:     General: No focal deficit present.     Mental Status: Brianna Velez is alert and oriented to person, place, and time.  Psychiatric:        Mood and Affect: Mood normal.        Behavior: Behavior normal.        Thought Content: Thought content normal.        Judgment: Judgment normal.     BP 126/74 (BP Location: Left Arm, Patient Position: Sitting, Cuff Size: Normal)  Pulse 91   Temp 98.1 F (36.7 C) (Temporal)   Wt 191 lb 3.2 oz (86.7 kg)   LMP 08/14/2016   SpO2 96%   BMI 32.82 kg/m  Wt Readings from Last 3 Encounters:  05/31/22 191 lb 3.2 oz (86.7 kg)  03/02/22 184 lb 3.2 oz (83.6 kg)  12/01/21 192 lb (87.1 kg)    Immunization History  Administered Date(s) Administered   Influenza,inj,Quad PF,6+ Mos 08/01/2017, 12/09/2017, 09/17/2018, 07/21/2019   Influenza-Unspecified 09/15/2021   Janssen (J&J) SARS-COV-2 Vaccination 04/20/2020   Unspecified SARS-COV-2 Vaccination 12/09/2020    Diabetic Foot Exam - Simple   No data filed     Lab Results  Component Value Date   TSH 0.831 01/26/2021   Lab Results  Component Value Date   WBC 4.9 03/02/2022   HGB 13.2 03/02/2022   HCT 40.0 03/02/2022   MCV 92 03/02/2022   PLT 239 03/02/2022   Lab Results  Component Value Date   NA 140 03/02/2022   K 4.9 03/02/2022   CO2 24 03/02/2022   GLUCOSE 102 (H) 03/02/2022   BUN 10 03/02/2022   CREATININE 0.83 03/02/2022   BILITOT 0.2 03/02/2022   ALKPHOS 102 03/02/2022   AST 29 03/02/2022   ALT 22 03/02/2022   PROT 7.9 03/02/2022   ALBUMIN 4.6 03/02/2022   CALCIUM 9.5 03/02/2022   ANIONGAP 10 01/14/2021   EGFR 87 03/02/2022   Lab Results  Component Value Date   CHOL 234 (H) 03/02/2022   CHOL 216 (H) 01/26/2021   CHOL 207 (H) 02/11/2020   Lab Results  Component Value Date   HDL 40 03/02/2022   HDL 45 01/26/2021   HDL 41 02/11/2020   Lab Results  Component Value Date   LDLCALC 150 (H) 03/02/2022   LDLCALC 146 (H) 01/26/2021   LDLCALC 130 (H)  02/11/2020   Lab Results  Component Value Date   TRIG 240 (H) 03/02/2022   TRIG 137 01/26/2021   TRIG 199 (H) 02/11/2020   Lab Results  Component Value Date   CHOLHDL 5.9 (H) 03/02/2022   CHOLHDL 4.8 (H) 01/26/2021   CHOLHDL 5.0 (H) 02/11/2020   Lab Results  Component Value Date   HGBA1C 6.7 (H) 03/02/2022   HGBA1C 6.3 (A) 12/01/2021   HGBA1C 6.3 12/01/2021   HGBA1C 6.3 12/01/2021   HGBA1C 6.3 12/01/2021       Assessment & Plan:   Problem List Items Addressed This Visit       Cardiovascular and Mediastinum   Hot flashes - Primary   Relevant Medications   estradiol (ESTRACE) 1 MG tablet, initiated during visit   Patches discontinued  Discussed non pharmacological methods for management of symptoms Informed to take OTC medications as needed      Digestive   Gastroesophageal reflux disease without esophagitis   Relevant Medications   pantoprazole (PROTONIX) 40 MG tablet, refilled during visit without change  Discussed non pharmacological methods for management of symptoms Informed to take OTC medications as needed    Other Visit Diagnoses     Estrogen deficiency       Relevant Medications   estradiol (ESTRACE) 1 MG tablet   Other hyperlipidemia       Relevant Medications   omega-3 acid ethyl esters (LOVAZA) 1 g capsule Encouraged continued diet and exercise efforts  Encouraged continued compliance with medication     Follow up in 3 mths for reevaluation of estrogen level and other symptoms, sooner as needed     I  have discontinued [place] Busser's estradiol. I am also having her start on estradiol. Additionally, I am having her maintain her One-A-Day Womens, furosemide, indomethacin, busPIRone, metFORMIN, cyclobenzaprine, gabapentin, DULoxetine, pantoprazole, and omega-3 acid ethyl esters.  Meds ordered this encounter  Medications   estradiol (ESTRACE) 1 MG tablet    Sig: Take 1 tablet (1 mg total) by mouth daily.    Dispense:  30 tablet    Refill:  2    pantoprazole (PROTONIX) 40 MG tablet    Sig: TAKE 1 TABLET(40 MG) BY MOUTH TWICE DAILY AS NEEDED    Dispense:  60 tablet    Refill:  3   omega-3 acid ethyl esters (LOVAZA) 1 g capsule    Sig: Take 2 capsules (2 g total) by mouth daily.    Dispense:  60 capsule    Refill:  2     Teena Dunk, NP

## 2022-06-03 ENCOUNTER — Other Ambulatory Visit: Payer: Self-pay | Admitting: Nurse Practitioner

## 2022-06-03 ENCOUNTER — Ambulatory Visit: Payer: Medicaid Other | Admitting: Nurse Practitioner

## 2022-06-03 DIAGNOSIS — G894 Chronic pain syndrome: Secondary | ICD-10-CM

## 2022-06-03 DIAGNOSIS — M545 Low back pain, unspecified: Secondary | ICD-10-CM

## 2022-08-11 ENCOUNTER — Other Ambulatory Visit: Payer: Self-pay | Admitting: Nurse Practitioner

## 2022-08-11 DIAGNOSIS — R232 Flushing: Secondary | ICD-10-CM

## 2022-08-11 DIAGNOSIS — M792 Neuralgia and neuritis, unspecified: Secondary | ICD-10-CM

## 2022-08-12 ENCOUNTER — Other Ambulatory Visit: Payer: Self-pay | Admitting: Nurse Practitioner

## 2022-08-12 DIAGNOSIS — K219 Gastro-esophageal reflux disease without esophagitis: Secondary | ICD-10-CM

## 2022-09-01 ENCOUNTER — Ambulatory Visit: Payer: Medicaid Other | Admitting: Nurse Practitioner

## 2022-11-18 ENCOUNTER — Other Ambulatory Visit: Payer: Self-pay

## 2022-11-18 DIAGNOSIS — E2839 Other primary ovarian failure: Secondary | ICD-10-CM

## 2022-11-18 DIAGNOSIS — R232 Flushing: Secondary | ICD-10-CM

## 2022-11-21 MED ORDER — ESTRADIOL 1 MG PO TABS
1.0000 mg | ORAL_TABLET | Freq: Every day | ORAL | 2 refills | Status: DC
Start: 2022-11-21 — End: 2023-03-20

## 2022-12-02 ENCOUNTER — Ambulatory Visit (INDEPENDENT_AMBULATORY_CARE_PROVIDER_SITE_OTHER): Payer: Medicaid Other | Admitting: Nurse Practitioner

## 2022-12-02 ENCOUNTER — Encounter: Payer: Self-pay | Admitting: Nurse Practitioner

## 2022-12-02 VITALS — BP 129/89 | HR 86 | Temp 97.6°F | Ht 64.0 in | Wt 193.0 lb

## 2022-12-02 DIAGNOSIS — K219 Gastro-esophageal reflux disease without esophagitis: Secondary | ICD-10-CM

## 2022-12-02 DIAGNOSIS — Z13220 Encounter for screening for lipoid disorders: Secondary | ICD-10-CM

## 2022-12-02 DIAGNOSIS — Z8719 Personal history of other diseases of the digestive system: Secondary | ICD-10-CM

## 2022-12-02 DIAGNOSIS — R7303 Prediabetes: Secondary | ICD-10-CM

## 2022-12-02 LAB — POCT GLYCOSYLATED HEMOGLOBIN (HGB A1C): Hemoglobin A1C: 6.4 % — AB (ref 4.0–5.6)

## 2022-12-02 NOTE — Patient Instructions (Signed)
1. Pre-diabetes  - POCT glycosylated hemoglobin (Hb A1C) - CBC - Comprehensive metabolic panel - Lipid Panel  2. Lipid screening  - Lipid Panel  3. Gastroesophageal reflux disease without esophagitis   4. History of hiatal hernia  - Ambulatory referral to Gastroenterology  Follow up:  Follow up in 6 months

## 2022-12-02 NOTE — Progress Notes (Signed)
$'@Patient'w$  ID: Brianna Velez, female    DOB: [birth year], 46 y.o.   MRN: [id number]  Chief Complaint  Patient presents with   Follow-up    Pt requesting for A1c     Referring provider: Teena Dunk, NP   HPI  Brianna Velez 46 y.o. female  has a past medical history of Anxiety and depression, Fibroids, History of 2019 novel coronavirus disease (COVID-19) (12/21/2019), History of hiatal hernia, Insomnia (02/11/2020), Leg pain, Migraine, and Sciatica.    Patient presents today for follow-up visit.  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  She was last seen by Tawana 6 months ago.  A1c has improved since last visit and is at 6.4 today.  Patient does not need refills today.  Patient does need blood work today.  No new issues or concerns today. Denies f/c/s, n/v/d, hemoptysis, PND, leg swelling Denies chest pain or edema      Allergies  Allergen Reactions   Prednisone     Insomnia "it has me up all night"   Hydrocodone Rash    Immunization History  Administered Date(s) Administered   Influenza,inj,Quad PF,6+ Mos 08/01/2017, 12/09/2017, 09/17/2018, 07/21/2019   Influenza-Unspecified 09/15/2021, 11/30/2022   Janssen (J&J) SARS-COV-2 Vaccination 04/20/2020   Unspecified SARS-COV-2 Vaccination 12/09/2020    Past Medical History:  Diagnosis Date   Anxiety and depression    Fibroids    History of 2019 novel coronavirus disease (COVID-19) 12/21/2019   History of hiatal hernia    Insomnia 02/11/2020   Leg pain    Migraine    Sciatica     Tobacco History: Social History   Tobacco Use  Smoking Status Former   Types: Cigarettes   Quit date: 12/05/1996   Years since quitting: 26.0  Smokeless Tobacco Never   Counseling given: Not Answered   Outpatient Encounter Medications as of 12/02/2022  Medication Sig   busPIRone (BUSPAR) 10 MG tablet TAKE 1 TABLET(10 MG) BY MOUTH THREE TIMES DAILY   cyclobenzaprine (FLEXERIL) 10 MG tablet Take 1 tablet, by mouth, 3 times a  day as needed.   DULoxetine (CYMBALTA) 20 MG capsule Take 1 capsule (20 mg total) by mouth daily.   estradiol (ESTRACE) 1 MG tablet Take 1 tablet (1 mg total) by mouth daily.   gabapentin (NEURONTIN) 600 MG tablet TAKE 1 TABLET(600 MG) BY MOUTH THREE TIMES DAILY   meloxicam (MOBIC) 15 MG tablet TAKE 1 TABLET(15 MG) BY MOUTH DAILY   pantoprazole (PROTONIX) 40 MG tablet TAKE 1 TABLET(40 MG) BY MOUTH TWICE DAILY AS NEEDED   omega-3 acid ethyl esters (LOVAZA) 1 g capsule Take 2 capsules (2 g total) by mouth daily.   No facility-administered encounter medications on file as of 12/02/2022.     Review of Systems  Review of Systems  Constitutional: Negative.   HENT: Negative.    Cardiovascular: Negative.   Gastrointestinal: Negative.   Allergic/Immunologic: Negative.   Neurological: Negative.   Psychiatric/Behavioral: Negative.         Physical Exam  BP 129/89   Pulse 86   Temp 97.6 F (36.4 C)   Ht '5\' 4"'$  (1.626 m)   Wt 193 lb (87.5 kg)   LMP 08/14/2016   BMI 33.13 kg/m   Wt Readings from Last 5 Encounters:  12/02/22 193 lb (87.5 kg)  05/31/22 191 lb 3.2 oz (86.7 kg)  03/02/22 184 lb 3.2 oz (83.6 kg)  12/01/21 192 lb (87.1 kg)  02/24/21 207 lb (93.9 kg)     Physical  Exam Vitals and nursing note reviewed.  Constitutional:      General: She is not in acute distress.    Appearance: She is well-developed.  Cardiovascular:     Rate and Rhythm: Normal rate and regular rhythm.  Pulmonary:     Effort: Pulmonary effort is normal.     Breath sounds: Normal breath sounds.  Neurological:     Mental Status: She is alert and oriented to person, place, and time.       Assessment & Plan:   Pre-diabetes - POCT glycosylated hemoglobin (Hb A1C) - CBC - Comprehensive metabolic panel - Lipid Panel  2. Lipid screening  - Lipid Panel  3. Gastroesophageal reflux disease without esophagitis   4. History of hiatal hernia  - Ambulatory referral to  Gastroenterology  Follow up:  Follow up in 6 months      Fenton Foy, NP 12/02/2022

## 2022-12-02 NOTE — Assessment & Plan Note (Signed)
-   POCT glycosylated hemoglobin (Hb A1C) - CBC - Comprehensive metabolic panel - Lipid Panel  2. Lipid screening  - Lipid Panel  3. Gastroesophageal reflux disease without esophagitis   4. History of hiatal hernia  - Ambulatory referral to Gastroenterology  Follow up:  Follow up in 6 months

## 2022-12-03 LAB — CBC
Hematocrit: 37.8 % (ref 34.0–46.6)
Hemoglobin: 12.6 g/dL (ref 11.1–15.9)
MCH: 30.2 pg (ref 26.6–33.0)
MCHC: 33.3 g/dL (ref 31.5–35.7)
MCV: 91 fL (ref 79–97)
Platelets: 202 10*3/uL (ref 150–450)
RBC: 4.17 x10E6/uL (ref 3.77–5.28)
RDW: 11.7 % (ref 11.7–15.4)
WBC: 4.8 10*3/uL (ref 3.4–10.8)

## 2022-12-03 LAB — COMPREHENSIVE METABOLIC PANEL
ALT: 14 IU/L (ref 0–32)
AST: 13 IU/L (ref 0–40)
Albumin/Globulin Ratio: 1.4 (ref 1.2–2.2)
Albumin: 4.3 g/dL (ref 3.9–4.9)
Alkaline Phosphatase: 86 IU/L (ref 44–121)
BUN/Creatinine Ratio: 12 (ref 9–23)
BUN: 9 mg/dL (ref 6–24)
Bilirubin Total: <0.2 mg/dL (ref 0.0–1.2)
CO2: 26 mmol/L (ref 20–29)
Calcium: 9.1 mg/dL (ref 8.7–10.2)
Chloride: 98 mmol/L (ref 96–106)
Creatinine, Ser: 0.76 mg/dL (ref 0.57–1.00)
Globulin, Total: 3 g/dL (ref 1.5–4.5)
Glucose: 122 mg/dL — ABNORMAL HIGH (ref 70–99)
Potassium: 4.5 mmol/L (ref 3.5–5.2)
Sodium: 138 mmol/L (ref 134–144)
Total Protein: 7.3 g/dL (ref 6.0–8.5)
eGFR: 97 mL/min/{1.73_m2} (ref >59)

## 2022-12-03 LAB — LIPID PANEL
Chol/HDL Ratio: 4.1 ratio (ref 0.0–4.4)
Cholesterol, Total: 212 mg/dL — ABNORMAL HIGH (ref 100–199)
HDL: 52 mg/dL (ref >39)
LDL Chol Calc (NIH): 119 mg/dL — ABNORMAL HIGH (ref 0–99)
Triglycerides: 237 mg/dL — ABNORMAL HIGH (ref 0–149)
VLDL Cholesterol Cal: 41 mg/dL — ABNORMAL HIGH (ref 5–40)

## 2022-12-14 ENCOUNTER — Other Ambulatory Visit: Payer: Self-pay | Admitting: Nurse Practitioner

## 2022-12-14 DIAGNOSIS — R232 Flushing: Secondary | ICD-10-CM

## 2022-12-14 DIAGNOSIS — M792 Neuralgia and neuritis, unspecified: Secondary | ICD-10-CM

## 2022-12-28 ENCOUNTER — Telehealth: Payer: Self-pay | Admitting: Nurse Practitioner

## 2022-12-28 NOTE — Telephone Encounter (Signed)
Caller & Relationship to patient: pt   MRN #  [id number]   Call Back Number: [phone]  Date of Last Office Visit: 12/14/2022     Date of Next Office Visit: 06/05/2023    Medication(s) to be Refilled: pantoprazole   Preferred Pharmacy: [facility] [address]   ** Please notify patient to allow 48-72 hours to process** **Let patient know to contact pharmacy at the end of the day to make sure medication is ready. ** **If patient has not been seen in a year or longer, book an appointment **Advise to use MyChart for refill requests OR to contact their pharmacy

## 2022-12-29 ENCOUNTER — Other Ambulatory Visit: Payer: Self-pay | Admitting: Nurse Practitioner

## 2022-12-29 ENCOUNTER — Other Ambulatory Visit: Payer: Self-pay

## 2022-12-29 DIAGNOSIS — K219 Gastro-esophageal reflux disease without esophagitis: Secondary | ICD-10-CM

## 2022-12-29 MED ORDER — PANTOPRAZOLE SODIUM 40 MG PO TBEC: DELAYED_RELEASE_TABLET | ORAL | 3 refills | Status: DC

## 2022-12-29 NOTE — Telephone Encounter (Signed)
Done KH 

## 2023-03-20 ENCOUNTER — Telehealth: Payer: Self-pay

## 2023-03-20 ENCOUNTER — Other Ambulatory Visit: Payer: Self-pay

## 2023-03-20 DIAGNOSIS — G894 Chronic pain syndrome: Secondary | ICD-10-CM

## 2023-03-20 DIAGNOSIS — E2839 Other primary ovarian failure: Secondary | ICD-10-CM

## 2023-03-20 DIAGNOSIS — M792 Neuralgia and neuritis, unspecified: Secondary | ICD-10-CM

## 2023-03-20 DIAGNOSIS — K219 Gastro-esophageal reflux disease without esophagitis: Secondary | ICD-10-CM

## 2023-03-20 DIAGNOSIS — R232 Flushing: Secondary | ICD-10-CM

## 2023-03-20 DIAGNOSIS — G8929 Other chronic pain: Secondary | ICD-10-CM

## 2023-03-20 MED ORDER — GABAPENTIN 600 MG PO TABS: ORAL_TABLET | ORAL | 0 refills | Status: DC

## 2023-03-20 MED ORDER — ESTRADIOL 1 MG PO TABS: 1.0000 mg | ORAL_TABLET | Freq: Every day | ORAL | 2 refills | Status: DC

## 2023-03-20 MED ORDER — MELOXICAM 15 MG PO TABS: ORAL_TABLET | ORAL | 2 refills | Status: DC

## 2023-03-20 MED ORDER — PANTOPRAZOLE SODIUM 40 MG PO TBEC: DELAYED_RELEASE_TABLET | ORAL | 3 refills | Status: DC

## 2023-03-20 NOTE — Telephone Encounter (Signed)
Sent to [facility]  with refills. KH

## 2023-03-20 NOTE — Telephone Encounter (Signed)
Caller & Relationship to patient:  MRN #  [id number]   Call Back Number:   Date of Last Office Visit: 12/29/2022     Date of Next Office Visit: 06/05/2023    Medication(s) to be Refilled: Pantoprazole, Estradiol.,Gabapentin and Meloxicam   Preferred Pharmacy: [facility] [address]  ** Please notify patient to allow 48-72 hours to process** **Let patient know to contact pharmacy at the end of the day to make sure medication is ready. ** **If patient has not been seen in a year or longer, book an appointment **Advise to use MyChart for refill requests OR to contact their pharmacy

## 2023-03-20 NOTE — Telephone Encounter (Signed)
Please advise KH 

## 2023-06-05 ENCOUNTER — Ambulatory Visit (INDEPENDENT_AMBULATORY_CARE_PROVIDER_SITE_OTHER): Payer: Medicaid Other | Admitting: Nurse Practitioner

## 2023-06-05 ENCOUNTER — Encounter: Payer: Self-pay | Admitting: Nurse Practitioner

## 2023-06-05 VITALS — BP 125/75 | HR 94 | Temp 97.2°F | Wt 201.0 lb

## 2023-06-05 DIAGNOSIS — M792 Neuralgia and neuritis, unspecified: Secondary | ICD-10-CM | POA: Diagnosis not present

## 2023-06-05 DIAGNOSIS — E2839 Other primary ovarian failure: Secondary | ICD-10-CM | POA: Diagnosis not present

## 2023-06-05 DIAGNOSIS — R7303 Prediabetes: Secondary | ICD-10-CM

## 2023-06-05 DIAGNOSIS — E782 Mixed hyperlipidemia: Secondary | ICD-10-CM

## 2023-06-05 DIAGNOSIS — R232 Flushing: Secondary | ICD-10-CM

## 2023-06-05 DIAGNOSIS — M62838 Other muscle spasm: Secondary | ICD-10-CM

## 2023-06-05 MED ORDER — CYCLOBENZAPRINE HCL 10 MG PO TABS: ORAL_TABLET | ORAL | 6 refills | Status: DC

## 2023-06-05 MED ORDER — GABAPENTIN 600 MG PO TABS: ORAL_TABLET | ORAL | 0 refills | Status: DC

## 2023-06-05 MED ORDER — ESTRADIOL 1 MG PO TABS: 1.0000 mg | ORAL_TABLET | Freq: Every day | ORAL | 2 refills | Status: DC

## 2023-06-05 MED ORDER — ROSUVASTATIN CALCIUM 5 MG PO TABS
5.0000 mg | ORAL_TABLET | Freq: Every day | ORAL | 11 refills | Status: AC
Start: 2023-06-05 — End: 2024-07-12

## 2023-06-05 NOTE — Patient Instructions (Signed)
1. Estrogen deficiency  - estradiol (ESTRACE) 1 MG tablet; Take 1 tablet (1 mg total) by mouth daily.  Dispense: 30 tablet; Refill: 2  2. Hot flashes  - estradiol (ESTRACE) 1 MG tablet; Take 1 tablet (1 mg total) by mouth daily.  Dispense: 30 tablet; Refill: 2 - gabapentin (NEURONTIN) 600 MG tablet; TAKE 1 TABLET(600 MG) BY MOUTH THREE TIMES DAILY  Dispense: 270 tablet; Refill: 0  3. Neuropathic pain  - gabapentin (NEURONTIN) 600 MG tablet; TAKE 1 TABLET(600 MG) BY MOUTH THREE TIMES DAILY  Dispense: 270 tablet; Refill: 0  4. Muscle spasms of both lower extremities  - cyclobenzaprine (FLEXERIL) 10 MG tablet; Take 1 tablet, by mouth, 3 times a day as needed.  Dispense: 90 tablet; Refill: 6  5. Mixed hyperlipidemia  - rosuvastatin (CRESTOR) 5 MG tablet; Take 1 tablet (5 mg total) by mouth daily.  Dispense: 30 tablet; Refill: 11 - CBC - Comprehensive metabolic panel  Follow up:  Follow up in 6 months

## 2023-06-05 NOTE — Assessment & Plan Note (Signed)
-   estradiol (ESTRACE) 1 MG tablet; Take 1 tablet (1 mg total) by mouth daily.  Dispense: 30 tablet; Refill: 2  2. Hot flashes  - estradiol (ESTRACE) 1 MG tablet; Take 1 tablet (1 mg total) by mouth daily.  Dispense: 30 tablet; Refill: 2 - gabapentin (NEURONTIN) 600 MG tablet; TAKE 1 TABLET(600 MG) BY MOUTH THREE TIMES DAILY  Dispense: 270 tablet; Refill: 0  3. Neuropathic pain  - gabapentin (NEURONTIN) 600 MG tablet; TAKE 1 TABLET(600 MG) BY MOUTH THREE TIMES DAILY  Dispense: 270 tablet; Refill: 0  4. Muscle spasms of both lower extremities  - cyclobenzaprine (FLEXERIL) 10 MG tablet; Take 1 tablet, by mouth, 3 times a day as needed.  Dispense: 90 tablet; Refill: 6  5. Mixed hyperlipidemia  - rosuvastatin (CRESTOR) 5 MG tablet; Take 1 tablet (5 mg total) by mouth daily.  Dispense: 30 tablet; Refill: 11 - CBC - Comprehensive metabolic panel  Follow up:  Follow up in 6 months

## 2023-06-05 NOTE — Progress Notes (Signed)
@Patient  ID: Brianna Velez, female    DOB: 1976/06/07, 47 y.o.   MRN: [id number]  Chief Complaint  Patient presents with   Follow-up    Referring provider: Kathrynn Speed, NP   HPI  Brianna Velez 47 y.o. female  has a past medical history of Anxiety and depression, Fibroids, History of 2019 novel coronavirus disease (COVID-19) (12/21/2019), History of hiatal hernia, Insomnia (02/11/2020), Leg pain, Migraine, and Sciatica.    Patient presents today for follow-up visit.  Cholesterol was elevated at last visit. Will order low dose Crestor. Patient does need blood work today.  No new issues or concerns today. Denies f/c/s, n/v/d, hemoptysis, PND, leg swelling. Denies chest pain or edema.     Allergies  Allergen Reactions   Prednisone     Insomnia "it has me up all night"   Hydrocodone Rash    Immunization History  Administered Date(s) Administered   Influenza,inj,Quad PF,6+ Mos 08/01/2017, 12/09/2017, 09/17/2018, 07/21/2019   Influenza-Unspecified 09/15/2021, 11/30/2022   Janssen (J&J) SARS-COV-2 Vaccination 04/20/2020   Unspecified SARS-COV-2 Vaccination 12/09/2020    Past Medical History:  Diagnosis Date   Anxiety and depression    Fibroids    History of 2019 novel coronavirus disease (COVID-19) 12/21/2019   History of hiatal hernia    Insomnia 02/11/2020   Leg pain    Migraine    Sciatica     Tobacco History: Social History   Tobacco Use  Smoking Status Former   Types: Cigarettes   Quit date: 12/05/1996   Years since quitting: 26.5  Smokeless Tobacco Never   Counseling given: Not Answered   Outpatient Encounter Medications as of 06/05/2023  Medication Sig   busPIRone (BUSPAR) 10 MG tablet TAKE 1 TABLET(10 MG) BY MOUTH THREE TIMES DAILY   meloxicam (MOBIC) 15 MG tablet TAKE 1 TABLET(15 MG) BY MOUTH DAILY   pantoprazole (PROTONIX) 40 MG tablet TAKE 1 TABLET(40 MG) BY MOUTH TWICE DAILY AS NEEDED   rosuvastatin (CRESTOR) 5 MG tablet Take 1 tablet (5 mg  total) by mouth daily.   [DISCONTINUED] cyclobenzaprine (FLEXERIL) 10 MG tablet Take 1 tablet, by mouth, 3 times a day as needed.   [DISCONTINUED] estradiol (ESTRACE) 1 MG tablet Take 1 tablet (1 mg total) by mouth daily.   [DISCONTINUED] gabapentin (NEURONTIN) 600 MG tablet TAKE 1 TABLET(600 MG) BY MOUTH THREE TIMES DAILY   cyclobenzaprine (FLEXERIL) 10 MG tablet Take 1 tablet, by mouth, 3 times a day as needed.   DULoxetine (CYMBALTA) 20 MG capsule Take 1 capsule (20 mg total) by mouth daily.   estradiol (ESTRACE) 1 MG tablet Take 1 tablet (1 mg total) by mouth daily.   gabapentin (NEURONTIN) 600 MG tablet TAKE 1 TABLET(600 MG) BY MOUTH THREE TIMES DAILY   omega-3 acid ethyl esters (LOVAZA) 1 g capsule Take 2 capsules (2 g total) by mouth daily. (Patient not taking: Reported on 06/05/2023)   No facility-administered encounter medications on file as of 06/05/2023.     Review of Systems  Review of Systems  Constitutional: Negative.   HENT: Negative.    Cardiovascular: Negative.   Gastrointestinal: Negative.   Allergic/Immunologic: Negative.   Neurological: Negative.   Psychiatric/Behavioral: Negative.         Physical Exam  BP 125/75   Pulse 94   Temp (!) 97.2 F (36.2 C)   Wt 201 lb (91.2 kg)   LMP 08/14/2016   SpO2 98%   BMI 34.50 kg/m   Wt Readings from Last 5 Encounters:  06/05/23  201 lb (91.2 kg)  12/02/22 193 lb (87.5 kg)  05/31/22 191 lb 3.2 oz (86.7 kg)  03/02/22 184 lb 3.2 oz (83.6 kg)  12/01/21 192 lb (87.1 kg)     Physical Exam Vitals and nursing note reviewed.  Constitutional:      General: She is not in acute distress.    Appearance: She is well-developed.  Cardiovascular:     Rate and Rhythm: Normal rate and regular rhythm.  Pulmonary:     Effort: Pulmonary effort is normal.     Breath sounds: Normal breath sounds.  Neurological:     Mental Status: She is alert and oriented to person, place, and time.      Lab Results:  CBC    Component  Value Date/Time   WBC 4.8 12/02/2022 1051   WBC 5.8 01/14/2021 1602   RBC 4.17 12/02/2022 1051   RBC 4.40 01/14/2021 1602   HGB 12.6 12/02/2022 1051   HCT 37.8 12/02/2022 1051   PLT 202 12/02/2022 1051   MCV 91 12/02/2022 1051   MCH 30.2 12/02/2022 1051   MCH 30.0 01/14/2021 1602   MCHC 33.3 12/02/2022 1051   MCHC 32.4 01/14/2021 1602   RDW 11.7 12/02/2022 1051   LYMPHSABS 2.3 03/02/2022 0836   MONOABS 0.5 01/14/2021 1602   EOSABS 0.1 03/02/2022 0836   BASOSABS 0.0 03/02/2022 0836    BMET    Component Value Date/Time   NA 138 12/02/2022 1051   K 4.5 12/02/2022 1051   CL 98 12/02/2022 1051   CO2 26 12/02/2022 1051   GLUCOSE 122 (H) 12/02/2022 1051   GLUCOSE 140 (H) 01/14/2021 1602   BUN 9 12/02/2022 1051   CREATININE 0.76 12/02/2022 1051   CALCIUM 9.1 12/02/2022 1051   GFRNONAA >60 01/14/2021 1602   GFRAA 103 02/11/2020 1330    BNP    Component Value Date/Time   BNP 11.0 06/16/2017 2246      Assessment & Plan:   Estrogen deficiency - estradiol (ESTRACE) 1 MG tablet; Take 1 tablet (1 mg total) by mouth daily.  Dispense: 30 tablet; Refill: 2  2. Hot flashes  - estradiol (ESTRACE) 1 MG tablet; Take 1 tablet (1 mg total) by mouth daily.  Dispense: 30 tablet; Refill: 2 - gabapentin (NEURONTIN) 600 MG tablet; TAKE 1 TABLET(600 MG) BY MOUTH THREE TIMES DAILY  Dispense: 270 tablet; Refill: 0  3. Neuropathic pain  - gabapentin (NEURONTIN) 600 MG tablet; TAKE 1 TABLET(600 MG) BY MOUTH THREE TIMES DAILY  Dispense: 270 tablet; Refill: 0  4. Muscle spasms of both lower extremities  - cyclobenzaprine (FLEXERIL) 10 MG tablet; Take 1 tablet, by mouth, 3 times a day as needed.  Dispense: 90 tablet; Refill: 6  5. Mixed hyperlipidemia  - rosuvastatin (CRESTOR) 5 MG tablet; Take 1 tablet (5 mg total) by mouth daily.  Dispense: 30 tablet; Refill: 11 - CBC - Comprehensive metabolic panel  Follow up:  Follow up in 6 months     Ivonne Andrew, NP 06/05/2023

## 2023-06-06 LAB — COMPREHENSIVE METABOLIC PANEL
ALT: 11 IU/L (ref 0–32)
AST: 14 IU/L (ref 0–40)
Albumin: 3.9 g/dL (ref 3.9–4.9)
Alkaline Phosphatase: 66 IU/L (ref 44–121)
BUN/Creatinine Ratio: 14 (ref 9–23)
BUN: 12 mg/dL (ref 6–24)
Bilirubin Total: <0.2 mg/dL (ref 0.0–1.2)
CO2: 26 mmol/L (ref 20–29)
Calcium: 8.9 mg/dL (ref 8.7–10.2)
Chloride: 101 mmol/L (ref 96–106)
Creatinine, Ser: 0.84 mg/dL (ref 0.57–1.00)
Globulin, Total: 3 g/dL (ref 1.5–4.5)
Glucose: 155 mg/dL — ABNORMAL HIGH (ref 70–99)
Potassium: 3.7 mmol/L (ref 3.5–5.2)
Sodium: 140 mmol/L (ref 134–144)
Total Protein: 6.9 g/dL (ref 6.0–8.5)
eGFR: 86 mL/min/{1.73_m2} (ref >59)

## 2023-06-06 LAB — CBC
Hematocrit: 37.8 % (ref 34.0–46.6)
Hemoglobin: 12 g/dL (ref 11.1–15.9)
MCH: 29.6 pg (ref 26.6–33.0)
MCHC: 31.7 g/dL (ref 31.5–35.7)
MCV: 93 fL (ref 79–97)
Platelets: 208 10*3/uL (ref 150–450)
RBC: 4.06 x10E6/uL (ref 3.77–5.28)
RDW: 12.2 % (ref 11.7–15.4)
WBC: 4.7 10*3/uL (ref 3.4–10.8)

## 2023-06-06 LAB — HEMOGLOBIN A1C
Est. average glucose Bld gHb Est-mCnc: 134 mg/dL
Hgb A1c MFr Bld: 6.3 % — ABNORMAL HIGH (ref 4.8–5.6)

## 2023-06-13 ENCOUNTER — Other Ambulatory Visit: Payer: Self-pay | Admitting: Nurse Practitioner

## 2023-06-13 DIAGNOSIS — G894 Chronic pain syndrome: Secondary | ICD-10-CM

## 2023-06-13 DIAGNOSIS — M545 Low back pain, unspecified: Secondary | ICD-10-CM

## 2023-07-08 ENCOUNTER — Other Ambulatory Visit: Payer: Self-pay | Admitting: Nurse Practitioner

## 2023-07-08 DIAGNOSIS — R232 Flushing: Secondary | ICD-10-CM

## 2023-07-08 DIAGNOSIS — E2839 Other primary ovarian failure: Secondary | ICD-10-CM

## 2023-09-04 ENCOUNTER — Other Ambulatory Visit: Payer: Self-pay | Admitting: Nurse Practitioner

## 2023-09-04 DIAGNOSIS — R232 Flushing: Secondary | ICD-10-CM

## 2023-09-04 DIAGNOSIS — M792 Neuralgia and neuritis, unspecified: Secondary | ICD-10-CM

## 2023-09-04 NOTE — Telephone Encounter (Signed)
Please advise Kh 

## 2023-10-13 ENCOUNTER — Other Ambulatory Visit: Payer: Self-pay | Admitting: Nurse Practitioner

## 2023-10-13 DIAGNOSIS — M545 Low back pain, unspecified: Secondary | ICD-10-CM

## 2023-10-13 DIAGNOSIS — G894 Chronic pain syndrome: Secondary | ICD-10-CM

## 2023-10-24 ENCOUNTER — Other Ambulatory Visit: Payer: Self-pay

## 2023-10-24 DIAGNOSIS — R232 Flushing: Secondary | ICD-10-CM

## 2023-10-24 DIAGNOSIS — E2839 Other primary ovarian failure: Secondary | ICD-10-CM

## 2023-10-24 MED ORDER — ESTRADIOL 1 MG PO TABS: 1.0000 mg | ORAL_TABLET | Freq: Every day | ORAL | 2 refills | Status: DC

## 2023-10-24 NOTE — Telephone Encounter (Signed)
Please advise KH 

## 2023-12-07 ENCOUNTER — Ambulatory Visit: Payer: Self-pay | Admitting: Nurse Practitioner

## 2023-12-25 ENCOUNTER — Ambulatory Visit: Payer: Self-pay | Admitting: Nurse Practitioner

## 2024-01-12 ENCOUNTER — Other Ambulatory Visit: Payer: Self-pay | Admitting: Nurse Practitioner

## 2024-01-12 DIAGNOSIS — E2839 Other primary ovarian failure: Secondary | ICD-10-CM

## 2024-01-12 DIAGNOSIS — R232 Flushing: Secondary | ICD-10-CM

## 2024-01-15 ENCOUNTER — Ambulatory Visit: Payer: Self-pay | Admitting: Nurse Practitioner

## 2024-02-12 ENCOUNTER — Ambulatory Visit: Payer: Self-pay | Admitting: Nurse Practitioner

## 2024-03-11 ENCOUNTER — Ambulatory Visit (HOSPITAL_COMMUNITY)
Admission: RE | Admit: 2024-03-11 | Discharge: 2024-03-11 | Disposition: A | Discharge status: Home / Self Care | Source: Ambulatory Visit | Attending: Nurse Practitioner | Admitting: Nurse Practitioner

## 2024-03-11 ENCOUNTER — Ambulatory Visit: Admitting: Nurse Practitioner

## 2024-03-11 ENCOUNTER — Telehealth: Payer: Self-pay

## 2024-03-11 ENCOUNTER — Encounter: Payer: Self-pay | Admitting: Nurse Practitioner

## 2024-03-11 VITALS — BP 135/86 | HR 97 | Temp 97.5°F | Wt 202.6 lb

## 2024-03-11 DIAGNOSIS — M25532 Pain in left wrist: Secondary | ICD-10-CM | POA: Diagnosis not present

## 2024-03-11 DIAGNOSIS — M7989 Other specified soft tissue disorders: Secondary | ICD-10-CM | POA: Diagnosis not present

## 2024-03-11 DIAGNOSIS — M25531 Pain in right wrist: Secondary | ICD-10-CM | POA: Diagnosis not present

## 2024-03-11 DIAGNOSIS — M79641 Pain in right hand: Secondary | ICD-10-CM | POA: Diagnosis not present

## 2024-03-11 DIAGNOSIS — M792 Neuralgia and neuritis, unspecified: Secondary | ICD-10-CM

## 2024-03-11 DIAGNOSIS — R232 Flushing: Secondary | ICD-10-CM

## 2024-03-11 DIAGNOSIS — M25541 Pain in joints of right hand: Secondary | ICD-10-CM | POA: Diagnosis not present

## 2024-03-11 DIAGNOSIS — L03011 Cellulitis of right finger: Secondary | ICD-10-CM | POA: Insufficient documentation

## 2024-03-11 DIAGNOSIS — G894 Chronic pain syndrome: Secondary | ICD-10-CM | POA: Diagnosis not present

## 2024-03-11 DIAGNOSIS — M545 Low back pain, unspecified: Secondary | ICD-10-CM | POA: Diagnosis not present

## 2024-03-11 MED ORDER — MELOXICAM 15 MG PO TABS: ORAL_TABLET | ORAL | 2 refills | Status: DC

## 2024-03-11 MED ORDER — GABAPENTIN 600 MG PO TABS: ORAL_TABLET | ORAL | 0 refills | Status: DC

## 2024-03-11 MED ORDER — CEPHALEXIN 500 MG PO CAPS
500.0000 mg | ORAL_CAPSULE | Freq: Three times a day (TID) | ORAL | 0 refills | Status: AC
Start: 2024-03-11 — End: 2024-03-21

## 2024-03-11 NOTE — Progress Notes (Signed)
 Subjective   Patient ID: Brianna Velez, female    DOB: Oct 03, 1976, 48 y.o.   MRN: [id number]  Chief Complaint  Patient presents with   Joint Swelling    Patient stated that her right index finger and wrist has been swelling for about three months now.    Referring provider: Ivonne Andrew, NP  Kiaraliz Rafuse is a 48 y.o. female with Past Medical History: No date: Anxiety and depression No date: Fibroids 12/21/2019: History of 2019 novel coronavirus disease (COVID-19) No date: History of hiatal hernia 02/11/2020: Insomnia No date: Leg pain No date: Migraine No date: Sciatica   HPI  Patient complains today of right index finger pain and swelling as well as bilateral lateral wrist pain and swelling.  She states that her finger has been swollen for the past 3 months.  It does appear to be associated with nailbed infection.  She states that her wrist hurt bilaterally when she is at work.  We will refer her to neurology for evaluation of carpal tunnel syndrome for her wrist.  We will order Keflex for her finger and offered steroids but patient refused.  We will get an x-ray. Denies f/c/s, n/v/d, hemoptysis, PND, leg swelling Denies chest pain or edema    Allergies  Allergen Reactions   Prednisone     Insomnia "it has me up all night"   Hydrocodone Rash    Immunization History  Administered Date(s) Administered   Influenza,inj,Quad PF,6+ Mos 08/01/2017, 12/09/2017, 09/17/2018, 07/21/2019   Influenza-Unspecified 09/15/2021, 11/30/2022   Janssen (J&J) SARS-COV-2 Vaccination 04/20/2020   Unspecified SARS-COV-2 Vaccination 12/09/2020    Tobacco History: Social History   Tobacco Use  Smoking Status Former   Current packs/day: 0.00   Types: Cigarettes   Quit date: 12/05/1996   Years since quitting: 27.2  Smokeless Tobacco Never   Counseling given: Not Answered   Outpatient Encounter Medications as of 03/11/2024  Medication Sig   busPIRone (BUSPAR) 10 MG tablet TAKE 1  TABLET(10 MG) BY MOUTH THREE TIMES DAILY   cephALEXin (KEFLEX) 500 MG capsule Take 1 capsule (500 mg total) by mouth 3 (three) times daily for 10 days.   cyclobenzaprine (FLEXERIL) 10 MG tablet Take 1 tablet, by mouth, 3 times a day as needed.   DULoxetine (CYMBALTA) 20 MG capsule Take 1 capsule (20 mg total) by mouth daily.   estradiol (ESTRACE) 1 MG tablet TAKE 1 TABLET(1 MG) BY MOUTH DAILY   pantoprazole (PROTONIX) 40 MG tablet TAKE 1 TABLET(40 MG) BY MOUTH TWICE DAILY AS NEEDED   rosuvastatin (CRESTOR) 5 MG tablet Take 1 tablet (5 mg total) by mouth daily.   [DISCONTINUED] gabapentin (NEURONTIN) 600 MG tablet TAKE 1 TABLET(600 MG) BY MOUTH THREE TIMES DAILY   [DISCONTINUED] meloxicam (MOBIC) 15 MG tablet TAKE 1 TABLET(15 MG) BY MOUTH DAILY   gabapentin (NEURONTIN) 600 MG tablet TAKE 1 TABLET(600 MG) BY MOUTH THREE TIMES DAILY   meloxicam (MOBIC) 15 MG tablet TAKE 1 TABLET(15 MG) BY MOUTH DAILY   omega-3 acid ethyl esters (LOVAZA) 1 g capsule Take 2 capsules (2 g total) by mouth daily. (Patient not taking: Reported on 06/05/2023)   No facility-administered encounter medications on file as of 03/11/2024.    Review of Systems  Review of Systems  Constitutional: Negative.   HENT: Negative.    Cardiovascular: Negative.   Gastrointestinal: Negative.   Musculoskeletal:        Bilateral wrist pain. Right index finger swollen  Allergic/Immunologic: Negative.   Neurological: Negative.  Psychiatric/Behavioral: Negative.       Objective:   BP 135/86   Pulse 97   Temp (!) 97.5 F (36.4 C) (Oral)   Wt 202 lb 9.6 oz (91.9 kg)   LMP 08/14/2016   SpO2 97%   BMI 34.78 kg/m   Wt Readings from Last 5 Encounters:  03/11/24 202 lb 9.6 oz (91.9 kg)  06/05/23 201 lb (91.2 kg)  12/02/22 193 lb (87.5 kg)  05/31/22 191 lb 3.2 oz (86.7 kg)  03/02/22 184 lb 3.2 oz (83.6 kg)     Physical Exam Vitals and nursing note reviewed.  Constitutional:      General: She is not in acute distress.     Appearance: She is well-developed.  Cardiovascular:     Rate and Rhythm: Normal rate and regular rhythm.  Pulmonary:     Effort: Pulmonary effort is normal.     Breath sounds: Normal breath sounds.  Musculoskeletal:     Comments: Swelling to right index finger starting at nail bed.   Neurological:     Mental Status: She is alert and oriented to person, place, and time.       Assessment & Plan:   Paronychia of finger of right hand -     Cephalexin; Take 1 capsule (500 mg total) by mouth 3 (three) times daily for 10 days.  Dispense: 30 capsule; Refill: 0 -     DG Hand Complete Right  Bilateral wrist pain -     Ambulatory referral to Neurology  Chronic pain syndrome -     Meloxicam; TAKE 1 TABLET(15 MG) BY MOUTH DAILY  Dispense: 30 tablet; Refill: 2  Chronic left-sided low back pain without sciatica -     Meloxicam; TAKE 1 TABLET(15 MG) BY MOUTH DAILY  Dispense: 30 tablet; Refill: 2  Hot flashes -     Gabapentin; TAKE 1 TABLET(600 MG) BY MOUTH THREE TIMES DAILY  Dispense: 270 tablet; Refill: 0  Neuropathic pain -     Gabapentin; TAKE 1 TABLET(600 MG) BY MOUTH THREE TIMES DAILY  Dispense: 270 tablet; Refill: 0     Return if symptoms worsen or fail to improve.   Ivonne Andrew, NP 03/11/2024

## 2024-03-11 NOTE — Telephone Encounter (Signed)
 Copied from CRM [id number]. Topic: Clinical - Lab/Test Results >> Mar 11, 2024 12:32 PM Fuller Mandril wrote: Reason for CRM: Patient called. Does not have access to MyChart to review results for hand. Would like call with results. Let her know that have not been completed yet. Would like a call once complete and reviewed. Thank You  Just FYI. kh

## 2024-03-11 NOTE — Patient Instructions (Addendum)
 1. Paronychia of finger of right hand (Primary)  - cephALEXin (KEFLEX) 500 MG capsule; Take 1 capsule (500 mg total) by mouth 3 (three) times daily for 10 days.  Dispense: 30 capsule; Refill: 0 - DG Hand Complete Right  2. Bilateral wrist pain  - Ambulatory referral to Neurology  Paronychia Paronychia is an infection of the skin. It happens near a fingernail or toenail. It may cause pain and swelling around the nail. In some cases, a fluid-filled bump (abscess) can form near or under the nail. Often, this condition is not serious, and it clears up with treatment. What are the causes? This condition may be caused by a germ. The germ may be bacteria or a fungus. These germs can enter the body through an opening in the skin, such as a cut or a hangnail. Other causes include: Repeated injuries to your fingernails or toenails. Irritation of the base and sides of the nail (cuticle). What increases the risk? This condition is more likely to develop in people who: Get their hands wet often, such as a dishwasher. Bite their fingernails or the base and sides of their nails. Have other skin problems. Have hangnails or hurt fingertips. Come into contact with chemicals like detergents. Have diabetes. What are the signs or symptoms? Redness and swelling of the skin near the nail. A tender feeling around the nail. Pus-filled bumps under the skin at the base and sides of the nail. Fluid or pus under the nail. Pain in the area. How is this treated? Treatment depends on the cause of your condition and how bad it is. If your condition is mild, it may clear up on its own in a few days or after soaking in warm water. If needed, treatment may include: Antibiotic medicine. Antifungal medicine. A procedure to drain pus from a fluid-filled bump. Medicine to treat irritation and swelling (corticosteroids). Taking off part of an ingrown toenail. A bandage (dressing) may be placed over the nail  area. Follow these instructions at home: Wound care Keep the affected area clean. Soak the fingers or toes in warm water as told by your doctor. You may be told to do this for 20 minutes, 2-3 times a day. Keep the area dry when you are not soaking it. Do not try to drain a fluid-filled bump on your own. Follow instructions from your doctor about how to take care of the affected area. Make sure you: Wash your hands with soap and water for at least 20 seconds before and after you change your bandage. If you cannot use soap and water, use hand sanitizer. Change your bandage as told by your doctor. If you had a fluid-filled bump and your doctor drained it, check the area every day for signs of infection. Check for: Redness, swelling, or pain. Fluid or blood. Warmth. Pus or a bad smell. Medicines  Take over-the-counter and prescription medicines only as told by your doctor. If you were prescribed an antibiotic medicine, take it as told by your doctor. Do not stop taking it even if you start to feel better. General instructions Avoid contact with anything that irritates your skin or that you are allergic to. Do not pick at the affected area. Keep all follow-up visits. Prevention To prevent this condition from happening again: Wear rubber gloves when putting your hands in water for washing dishes or other tasks. Wear gloves if your hands might touch cleaners or chemicals. Avoid injuring your nails or fingertips. Do not bite your nails or  tear hangnails. Do not cut your nails very short. Do not cut the skin at the base and sides of the nail. Use clean nail clippers or scissors when trimming nails. Contact a doctor if: You feel worse. You do not get better. You keep having or you have more fluid, blood, or pus coming from the affected area. Your affected finger, toe, or joint gets swollen or hard to move. You have a fever or chills. There is redness spreading from the affected  area. Summary Paronychia is an infection of the skin. It happens near a fingernail or toenail. This condition may cause pain and swelling around the nail. Soak the fingers or toes in warm water as told by your doctor. Often, this condition is not serious, and it clears up with treatment. This information is not intended to replace advice given to you by your health care provider. Make sure you discuss any questions you have with your health care provider. Document Revised: 02/22/2021 Document Reviewed: 02/22/2021 Elsevier Patient Education  2024 [employer].

## 2024-03-14 NOTE — Telephone Encounter (Unsigned)
 Copied from CRM [id number]. Topic: Clinical - Lab/Test Results >> Mar 14, 2024 10:20 AM Beacher May wrote: Reason for CRM: Pt calling in regards to her hand x-ray results.

## 2024-03-20 ENCOUNTER — Other Ambulatory Visit: Payer: Self-pay | Admitting: Nurse Practitioner

## 2024-03-20 DIAGNOSIS — M79641 Pain in right hand: Secondary | ICD-10-CM

## 2024-04-10 NOTE — Telephone Encounter (Signed)
 Done sent fax of x ray results. KH

## 2024-04-16 ENCOUNTER — Other Ambulatory Visit: Payer: Self-pay | Admitting: Nurse Practitioner

## 2024-04-16 DIAGNOSIS — E2839 Other primary ovarian failure: Secondary | ICD-10-CM

## 2024-04-16 DIAGNOSIS — R232 Flushing: Secondary | ICD-10-CM

## 2024-04-16 NOTE — Telephone Encounter (Signed)
 Please advise [facility]

## 2024-05-06 ENCOUNTER — Other Ambulatory Visit: Payer: Self-pay | Admitting: Nurse Practitioner

## 2024-05-06 DIAGNOSIS — R232 Flushing: Secondary | ICD-10-CM

## 2024-05-06 DIAGNOSIS — F419 Anxiety disorder, unspecified: Secondary | ICD-10-CM

## 2024-05-06 DIAGNOSIS — M792 Neuralgia and neuritis, unspecified: Secondary | ICD-10-CM

## 2024-05-06 MED ORDER — DULOXETINE HCL 20 MG PO CPEP: 20.0000 mg | ORAL_CAPSULE | Freq: Every day | ORAL | 3 refills | Status: AC

## 2024-05-06 NOTE — Telephone Encounter (Signed)
 Copied from CRM [id number]. Topic: Clinical - Medication Refill >> May 06, 2024  9:45 AM Sasha H wrote: Medication: DULoxetine  (CYMBALTA ) 20 MG capsule  Has the patient contacted their pharmacy? No (Agent: If no, request that the patient contact the pharmacy for the refill. If patient does not wish to contact the pharmacy document the reason why and proceed with request.) (Agent: If yes, when and what did the pharmacy advise?)  This is the patient's preferred pharmacy:  [id number] [place], [place] - [address] AT [facility] OF [facility] & [address] [place] [address] Phone: [phone] Fax: [phone]  Is this the correct pharmacy for this prescription? Yes If no, delete pharmacy and type the correct one.   Has the prescription been filled recently? Yes  Is the patient out of the medication? Yes  Has the patient been seen for an appointment in the last year OR does the patient have an upcoming appointment? Yes  Can we respond through MyChart? Yes  Agent: Please be advised that Rx refills may take up to 3 business days. We ask that you follow-up with your pharmacy.

## 2024-06-04 ENCOUNTER — Other Ambulatory Visit: Payer: Self-pay | Admitting: Nurse Practitioner

## 2024-06-04 DIAGNOSIS — R232 Flushing: Secondary | ICD-10-CM

## 2024-06-04 DIAGNOSIS — M792 Neuralgia and neuritis, unspecified: Secondary | ICD-10-CM

## 2024-07-03 ENCOUNTER — Other Ambulatory Visit: Payer: Self-pay | Admitting: Nurse Practitioner

## 2024-07-03 DIAGNOSIS — K219 Gastro-esophageal reflux disease without esophagitis: Secondary | ICD-10-CM

## 2024-07-03 NOTE — Telephone Encounter (Unsigned)
 Copied from CRM [id number]. Topic: Clinical - Medication Refill >> Jul 03, 2024  1:29 PM Antwanette L wrote: Medication: pantoprazole  (PROTONIX ) 40 MG tablet  Has the patient contacted their pharmacy? Yes   This is the patient's preferred pharmacy:  [facility] #[id number] [place], [place] - [address] AT [facility] OF [facility] & [address] [place] [address] Phone: [phone] Fax: [phone]   Is this the correct pharmacy for this prescription? Yes   Has the prescription been filled recently? No. Last refill was on 03/20/23  Is the patient out of the medication? Yes  Has the patient been seen for an appointment in the last year OR does the patient have an upcoming appointment? Yes. Last ov with Kyung Borer Np was on 03/11/24  Can we respond through MyChart? No. Contact the patient by phone at [phone]  Agent: Please be advised that Rx refills may take up to 3 business days. We ask that you follow-up with your pharmacy.

## 2024-07-05 ENCOUNTER — Other Ambulatory Visit: Payer: Self-pay | Admitting: Nurse Practitioner

## 2024-07-05 DIAGNOSIS — G894 Chronic pain syndrome: Secondary | ICD-10-CM

## 2024-07-05 DIAGNOSIS — M545 Low back pain, unspecified: Secondary | ICD-10-CM

## 2024-07-05 MED ORDER — PANTOPRAZOLE SODIUM 40 MG PO TBEC: DELAYED_RELEASE_TABLET | ORAL | 3 refills | Status: AC

## 2024-07-10 ENCOUNTER — Other Ambulatory Visit: Payer: Self-pay | Admitting: Nurse Practitioner

## 2024-07-10 DIAGNOSIS — G894 Chronic pain syndrome: Secondary | ICD-10-CM

## 2024-07-10 DIAGNOSIS — G8929 Other chronic pain: Secondary | ICD-10-CM

## 2024-07-12 ENCOUNTER — Encounter: Payer: Self-pay | Admitting: Nurse Practitioner

## 2024-07-12 ENCOUNTER — Ambulatory Visit (INDEPENDENT_AMBULATORY_CARE_PROVIDER_SITE_OTHER): Admitting: Nurse Practitioner

## 2024-07-12 VITALS — BP 122/77 | HR 60 | Temp 97.5°F | Wt 196.6 lb

## 2024-07-12 DIAGNOSIS — H9203 Otalgia, bilateral: Secondary | ICD-10-CM

## 2024-07-12 DIAGNOSIS — R7303 Prediabetes: Secondary | ICD-10-CM

## 2024-07-12 DIAGNOSIS — E782 Mixed hyperlipidemia: Secondary | ICD-10-CM | POA: Diagnosis not present

## 2024-07-12 DIAGNOSIS — Z1329 Encounter for screening for other suspected endocrine disorder: Secondary | ICD-10-CM | POA: Diagnosis not present

## 2024-07-12 DIAGNOSIS — Z1211 Encounter for screening for malignant neoplasm of colon: Secondary | ICD-10-CM

## 2024-07-12 MED ORDER — OFLOXACIN 0.3 % OT SOLN: 5.0000 [drp] | Freq: Two times a day (BID) | OTIC | 0 refills | Status: DC

## 2024-07-12 NOTE — Progress Notes (Signed)
 Subjective   Patient ID: Brianna Velez, female    DOB: [birth year], 48 y.o.   MRN: [id number]  Chief Complaint  Patient presents with   Medical Management of Chronic Issues   Ear Pain    Ear pain in both ears    Referring provider: Oley Bascom RAMAN, NP  Brianna Velez is a 48 y.o. female with Past Medical History: No date: Anxiety and depression No date: Fibroids 12/21/2019: History of 2019 novel coronavirus disease (COVID-19) No date: History of hiatal hernia 02/11/2020: Insomnia No date: Leg pain No date: Migraine No date: Sciatica  HPI  Timothy Townsel 48 y.o. female  has a past medical history of Anxiety and depression, Fibroids, History of 2019 novel coronavirus disease (COVID-19) (12/21/2019), History of hiatal hernia, Insomnia (02/11/2020), Leg pain, Migraine, and Sciatica.     Patient presents today for follow-up visit.  Cholesterol was elevated at last visit. Has been compliant with Crestor . Patient does need blood work today.  No new issues or concerns today. Denies f/c/s, n/v/d, hemoptysis, PND, leg swelling. Denies chest pain or edema.  Note: bilateral ear pain for weeks. Will trial ofloxin ear drops.    Allergies  Allergen Reactions   Prednisone      Insomnia it has me up all night   Hydrocodone  Rash    Immunization History  Administered Date(s) Administered   Influenza,inj,Quad PF,6+ Mos 08/01/2017, 12/09/2017, 09/17/2018, 07/21/2019   Influenza-Unspecified 09/15/2021, 11/30/2022   Janssen (J&J) SARS-COV-2 Vaccination 04/20/2020   Unspecified SARS-COV-2 Vaccination 12/09/2020    Tobacco History: Social History   Tobacco Use  Smoking Status Former   Current packs/day: 0.00   Types: Cigarettes   Quit date: 12/05/1996   Years since quitting: 27.6  Smokeless Tobacco Never   Counseling given: Not Answered   Outpatient Encounter Medications as of 07/12/2024  Medication Sig   busPIRone  (BUSPAR ) 10 MG tablet TAKE 1 TABLET(10 MG) BY MOUTH THREE TIMES  DAILY   cyclobenzaprine  (FLEXERIL ) 10 MG tablet Take 1 tablet, by mouth, 3 times a day as needed.   estradiol  (ESTRACE ) 1 MG tablet TAKE 1 TABLET(1 MG) BY MOUTH DAILY   gabapentin  (NEURONTIN ) 600 MG tablet TAKE 1 TABLET(600 MG) BY MOUTH THREE TIMES DAILY   meloxicam  (MOBIC ) 15 MG tablet TAKE 1 TABLET(15 MG) BY MOUTH DAILY   ofloxacin  (FLOXIN ) 0.3 % OTIC solution Place 5 drops into both ears 2 (two) times daily.   pantoprazole  (PROTONIX ) 40 MG tablet TAKE 1 TABLET(40 MG) BY MOUTH TWICE DAILY AS NEEDED   rosuvastatin  (CRESTOR ) 5 MG tablet Take 1 tablet (5 mg total) by mouth daily.   DULoxetine  (CYMBALTA ) 20 MG capsule Take 1 capsule (20 mg total) by mouth daily. (Patient not taking: Reported on 07/12/2024)   omega-3 acid ethyl esters (LOVAZA ) 1 g capsule Take 2 capsules (2 g total) by mouth daily. (Patient not taking: Reported on 06/05/2023)   No facility-administered encounter medications on file as of 07/12/2024.    Review of Systems  Review of Systems  Constitutional: Negative.   HENT: Negative.    Cardiovascular: Negative.   Gastrointestinal: Negative.   Allergic/Immunologic: Negative.   Neurological: Negative.   Psychiatric/Behavioral: Negative.       Objective:   BP 122/77   Pulse 60   Temp (!) 97.5 F (36.4 C) (Oral)   Wt 196 lb 9.6 oz (89.2 kg)   LMP 08/14/2016   SpO2 98%   BMI 33.75 kg/m   Wt Readings from Last 5 Encounters:  07/12/24 196 lb 9.6  oz (89.2 kg)  03/11/24 202 lb 9.6 oz (91.9 kg)  06/05/23 201 lb (91.2 kg)  12/02/22 193 lb (87.5 kg)  05/31/22 191 lb 3.2 oz (86.7 kg)     Physical Exam Vitals and nursing note reviewed.  Constitutional:      General: She is not in acute distress.    Appearance: She is well-developed.  Cardiovascular:     Rate and Rhythm: Normal rate and regular rhythm.  Pulmonary:     Effort: Pulmonary effort is normal.     Breath sounds: Normal breath sounds.  Neurological:     Mental Status: She is alert and oriented to person,  place, and time.       Assessment & Plan:   Pre-diabetes -     Hemoglobin A1c -     CBC -     Comprehensive metabolic panel with GFR -     Lipid panel  Mixed hyperlipidemia -     Hemoglobin A1c -     CBC -     Comprehensive metabolic panel with GFR -     Lipid panel  Thyroid  disorder screen -     TSH  Colon cancer screening -     Ambulatory referral to Gastroenterology  Acute ear pain, bilateral -     Ofloxacin ; Place 5 drops into both ears 2 (two) times daily.  Dispense: 5 mL; Refill: 0     Return in about 6 months (around 01/12/2025).   Bascom GORMAN Borer, NP 07/12/2024

## 2024-07-13 LAB — COMPREHENSIVE METABOLIC PANEL WITH GFR
ALT: 15 IU/L (ref 0–32)
AST: 23 IU/L (ref 0–40)
Albumin: 4.3 g/dL (ref 3.9–4.9)
Alkaline Phosphatase: 57 IU/L (ref 44–121)
BUN/Creatinine Ratio: 13 (ref 9–23)
BUN: 11 mg/dL (ref 6–24)
Bilirubin Total: 0.2 mg/dL (ref 0.0–1.2)
CO2: 23 mmol/L (ref 20–29)
Calcium: 9.1 mg/dL (ref 8.7–10.2)
Chloride: 102 mmol/L (ref 96–106)
Creatinine, Ser: 0.85 mg/dL (ref 0.57–1.00)
Globulin, Total: 2.6 g/dL (ref 1.5–4.5)
Glucose: 90 mg/dL (ref 70–99)
Potassium: 4.9 mmol/L (ref 3.5–5.2)
Sodium: 140 mmol/L (ref 134–144)
Total Protein: 6.9 g/dL (ref 6.0–8.5)
eGFR: 84 mL/min/1.73 (ref >59)

## 2024-07-13 LAB — HEMOGLOBIN A1C
Est. average glucose Bld gHb Est-mCnc: 120 mg/dL
Hgb A1c MFr Bld: 5.8 % — ABNORMAL HIGH (ref 4.8–5.6)

## 2024-07-13 LAB — TSH: TSH: 1.67 u[IU]/mL (ref 0.450–4.500)

## 2024-07-13 LAB — LIPID PANEL
Chol/HDL Ratio: 2.7 ratio (ref 0.0–4.4)
Cholesterol, Total: 122 mg/dL (ref 100–199)
HDL: 45 mg/dL (ref >39)
LDL Chol Calc (NIH): 57 mg/dL (ref 0–99)
Triglycerides: 109 mg/dL (ref 0–149)
VLDL Cholesterol Cal: 20 mg/dL (ref 5–40)

## 2024-07-13 LAB — CBC
Hematocrit: 40.4 % (ref 34.0–46.6)
Hemoglobin: 12.8 g/dL (ref 11.1–15.9)
MCH: 30.1 pg (ref 26.6–33.0)
MCHC: 31.7 g/dL (ref 31.5–35.7)
MCV: 95 fL (ref 79–97)
Platelets: 211 x10E3/uL (ref 150–450)
RBC: 4.25 x10E6/uL (ref 3.77–5.28)
RDW: 12.5 % (ref 11.7–15.4)
WBC: 4.8 x10E3/uL (ref 3.4–10.8)

## 2024-07-15 ENCOUNTER — Encounter: Payer: Self-pay | Admitting: Neurology

## 2024-07-15 ENCOUNTER — Ambulatory Visit: Payer: Self-pay | Admitting: Nurse Practitioner

## 2024-07-15 ENCOUNTER — Ambulatory Visit: Admitting: Neurology

## 2024-08-12 ENCOUNTER — Other Ambulatory Visit: Payer: Self-pay | Admitting: Nurse Practitioner

## 2024-08-12 DIAGNOSIS — R232 Flushing: Secondary | ICD-10-CM

## 2024-08-12 DIAGNOSIS — E2839 Other primary ovarian failure: Secondary | ICD-10-CM

## 2024-09-11 ENCOUNTER — Encounter: Payer: Self-pay | Admitting: Nurse Practitioner

## 2024-09-24 ENCOUNTER — Telehealth: Payer: Self-pay

## 2024-09-24 NOTE — Telephone Encounter (Signed)
 Rx Refill on cyclobenzaprine  (FLEXERIL ) 10 MG tablet [[id number]]

## 2024-09-24 NOTE — Telephone Encounter (Signed)
 Also, Rx Refill on meloxicam  (MOBIC ) 15 MG tablet [[id number]]

## 2024-09-26 ENCOUNTER — Other Ambulatory Visit: Payer: Self-pay

## 2024-09-26 DIAGNOSIS — G894 Chronic pain syndrome: Secondary | ICD-10-CM

## 2024-09-26 DIAGNOSIS — G8929 Other chronic pain: Secondary | ICD-10-CM

## 2024-09-26 DIAGNOSIS — M62838 Other muscle spasm: Secondary | ICD-10-CM

## 2024-09-26 MED ORDER — MELOXICAM 15 MG PO TABS: ORAL_TABLET | ORAL | 2 refills | Status: DC

## 2024-09-26 MED ORDER — CYCLOBENZAPRINE HCL 10 MG PO TABS: ORAL_TABLET | ORAL | 6 refills | Status: AC

## 2024-09-26 NOTE — Telephone Encounter (Signed)
 Please advise [facility]

## 2024-09-26 NOTE — Telephone Encounter (Signed)
 Sent to the provider. KH

## 2024-10-08 ENCOUNTER — Ambulatory Visit (HOSPITAL_COMMUNITY)
Admission: EM | Admit: 2024-10-08 | Discharge: 2024-10-08 | Disposition: A | Discharge status: Home / Self Care | Attending: Student | Admitting: Student

## 2024-10-08 ENCOUNTER — Ambulatory Visit (INDEPENDENT_AMBULATORY_CARE_PROVIDER_SITE_OTHER)

## 2024-10-08 ENCOUNTER — Encounter (HOSPITAL_COMMUNITY): Payer: Self-pay

## 2024-10-08 DIAGNOSIS — S63502A Unspecified sprain of left wrist, initial encounter: Secondary | ICD-10-CM

## 2024-10-08 DIAGNOSIS — M79642 Pain in left hand: Secondary | ICD-10-CM | POA: Diagnosis not present

## 2024-10-08 DIAGNOSIS — M25532 Pain in left wrist: Secondary | ICD-10-CM | POA: Diagnosis not present

## 2024-10-08 DIAGNOSIS — M1812 Unilateral primary osteoarthritis of first carpometacarpal joint, left hand: Secondary | ICD-10-CM | POA: Diagnosis not present

## 2024-10-08 NOTE — ED Triage Notes (Signed)
 Pt states tripped over a stump and used her lt hand to catch her fall. C/o decrease movement and swelling. Took tylenol  with no relief.

## 2024-10-08 NOTE — Discharge Instructions (Addendum)
-  We will call with any abnormal Xray results before the end of the day -You can take Tylenol  up to 1000 mg 3 times daily, and ibuprofen  up to 600 mg 3 times daily with food.  You can take these together, or alternate every 3-4 hours. (If you are taking Meloxicam , then do not take other NSAIDs, including ibuprofen ). -Wrist brace while pain persists -Rest, ice -Follow-up with [facility] if symptoms persist.

## 2024-10-08 NOTE — ED Provider Notes (Signed)
 [facility]    CSN: [id number] Arrival date & time: 10/08/24  0849      History   Chief Complaint Chief Complaint  Patient presents with   Hand Injury    HPI Brianna Velez is a 48 y.o. female presenting with L hand pain.  History chronic pain syndrome, migraine, anxiety and depression.  Pt states tripped over a stump and used her L hand to catch her fall. C/o decrease movement and swelling, which is worse over the wrist. Denies sensation changes. Took tylenol  with no relief. She is right handed.   HPI  Past Medical History:  Diagnosis Date   Anxiety and depression    Fibroids    History of 2019 novel coronavirus disease (COVID-19) 12/21/2019   History of hiatal hernia    Insomnia 02/11/2020   Leg pain    Migraine    Sciatica     Patient Active Problem List   Diagnosis Date Noted   Estrogen deficiency 06/05/2023   Pre-diabetes 12/02/2022   Hot flashes 05/14/2020   Gastroesophageal reflux disease without esophagitis 05/14/2020   Chronic pain of both knees 05/14/2020   Chronic left-sided low back pain without sciatica 08/14/2019   Chronic abdominal pain 08/14/2019   Vitamin D  deficiency 08/14/2019   Postmenopausal vaginal bleeding 06/05/2019   Pneumonia of left lower lobe due to infectious organism 06/05/2019   Cough 06/05/2019   Generalized abdominal pain 05/21/2019   Neuropathic pain 05/08/2019   Anxiety and depression 05/08/2019   Assistance needed with transportation 05/08/2019   Peripheral edema 04/18/2019   Abdominal distension (gaseous) 04/18/2019   Flatulence/gas pain/belching 04/18/2019   Intractable migraine without status migrainosus 04/18/2019   Chronic pain syndrome 03/31/2016   Screening for diabetes mellitus 12/31/2015   S/P TAH (total abdominal hysterectomy) 09/29/2015   Fibroid, uterine    Menorrhagia with regular cycle    Fibroid uterus 07/01/2015   Left hip pain 11/06/2014    Past Surgical History:  Procedure Laterality Date    ABDOMINAL HYSTERECTOMY  09/29/2015   Procedure: HYSTERECTOMY ABDOMINAL;  Surgeon: Winton Felt, MD;  Location: [facility];  Service: Gynecology;;   BILATERAL SALPINGECTOMY Bilateral 09/29/2015   Procedure: BILATERAL SALPINGECTOMY;  Surgeon: Winton Felt, MD;  Location: [facility];  Service: Gynecology;  Laterality: Bilateral;   TUBAL LIGATION     WISDOM TOOTH EXTRACTION      OB History     Gravida  2   Para  2   Term  2   Preterm  0   AB  0   Living  2      SAB  0   IAB  0   Ectopic  0   Multiple  0   Live Births               Home Medications    Prior to Admission medications   Medication Sig Start Date End Date Taking? Authorizing Provider  busPIRone  (BUSPAR ) 10 MG tablet TAKE 1 TABLET(10 MG) BY MOUTH THREE TIMES DAILY 11/12/20   Stroud, Natalie M, FNP  cyclobenzaprine  (FLEXERIL ) 10 MG tablet Take 1 tablet, by mouth, 3 times a day as needed. 09/26/24   Oley Bascom RAMAN, NP  DULoxetine  (CYMBALTA ) 20 MG capsule Take 1 capsule (20 mg total) by mouth daily. Patient not taking: Reported on 07/12/2024 05/06/24   Oley Bascom RAMAN, NP  estradiol  (ESTRACE ) 1 MG tablet TAKE 1 TABLET(1 MG) BY MOUTH DAILY 08/12/24   Nichols, Tonya S, NP  gabapentin  (NEURONTIN ) 600  MG tablet TAKE 1 TABLET(600 MG) BY MOUTH THREE TIMES DAILY 06/04/24   Paseda, Folashade R, FNP  meloxicam  (MOBIC ) 15 MG tablet TAKE 1 TABLET(15 MG) BY MOUTH DAILY 09/26/24   Nichols, Tonya S, NP  ofloxacin  (FLOXIN ) 0.3 % OTIC solution Place 5 drops into both ears 2 (two) times daily. 07/12/24   Oley Bascom RAMAN, NP  omega-3 acid ethyl esters (LOVAZA ) 1 g capsule Take 2 capsules (2 g total) by mouth daily. Patient not taking: Reported on 06/05/2023 05/31/22 06/05/23  Shannan Sia I, NP  pantoprazole  (PROTONIX ) 40 MG tablet TAKE 1 TABLET(40 MG) BY MOUTH TWICE DAILY AS NEEDED 07/05/24   Nichols, Tonya S, NP  rosuvastatin  (CRESTOR ) 5 MG tablet Take 1 tablet (5 mg total) by mouth daily. 06/05/23 07/12/24  Oley Bascom RAMAN, NP     Family History Family History  Problem Relation Age of Onset   Diabetes Sister    Heart disease Paternal Grandmother    Hypertension Father    Dementia Mother    Hypertension Mother     Social History Social History   Tobacco Use   Smoking status: Former    Current packs/day: 0.00    Types: Cigarettes    Quit date: 12/05/1996    Years since quitting: 27.8   Smokeless tobacco: Never  Vaping Use   Vaping status: Never Used  Substance Use Topics   Alcohol use: No   Drug use: No     Allergies   Prednisone  and Hydrocodone    Review of Systems Review of Systems  Musculoskeletal:        L hand pain     Physical Exam Triage Vital Signs ED Triage Vitals [10/08/24 1003]  Encounter Vitals Group     BP 122/82     Girls Systolic BP Percentile      Girls Diastolic BP Percentile      Boys Systolic BP Percentile      Boys Diastolic BP Percentile      Pulse Rate 86     Resp 18     Temp 97.8 F (36.6 C)     Temp Source Oral     SpO2 97 %     Weight      Height      Head Circumference      Peak Flow      Pain Score 10     Pain Loc      Pain Education      Exclude from Growth Chart    No data found.  Updated Vital Signs BP 122/82 (BP Location: Left Arm)   Pulse 86   Temp 97.8 F (36.6 C) (Oral)   Resp 18   LMP 08/14/2016   SpO2 97%   Visual Acuity Right Eye Distance:   Left Eye Distance:   Bilateral Distance:    Right Eye Near:   Left Eye Near:    Bilateral Near:     Physical Exam Vitals reviewed.  Constitutional:      General: She is not in acute distress.    Appearance: Normal appearance. She is not ill-appearing.  HENT:     Head: Normocephalic and atraumatic.  Pulmonary:     Effort: Pulmonary effort is normal.  Musculoskeletal:     Comments: L hand/wrist: No skin changes or swelling.  The distal radius and ulna are diffusely tender to palpation, without point tenderness.  Range of motion wrist intact, but with pain.  There is no snuffbox  tenderness.  There is  no tenderness of the metacarpals or phalanges. Cap refill <2 seconds.   Neurological:     General: No focal deficit present.     Mental Status: She is alert and oriented to person, place, and time.  Psychiatric:        Mood and Affect: Mood normal.        Behavior: Behavior normal.        Thought Content: Thought content normal.        Judgment: Judgment normal.      UC Treatments / Results  Labs (all labs ordered are listed, but only abnormal results are displayed) Labs Reviewed - No data to display  EKG   Radiology No results found.  Procedures Procedures (including critical care time)  Medications Ordered in UC Medications - No data to display  Initial Impression / Assessment and Plan / UC Course  I have reviewed the triage vital signs and the nursing notes.  Pertinent labs & imaging results that were available during my care of the patient were reviewed by me and considered in my medical decision making (see chart for details).     Patient is a pleasant 48 year old female presenting with L wrist sprain.  She is neurovascularly intact.  Xray R hand: Negative.  Xray R wrist:  1. No acute findings. 2. Minimal degenerative changes as described.   Placed in wrist brace. RICE. F/u with [facility] if symptoms persist in 1-2 weeks.   Final Clinical Impressions(s) / UC Diagnoses   Final diagnoses:  Left wrist pain  Sprain of left wrist, unspecified location, initial encounter     Discharge Instructions      -We will call with any abnormal Xray results before the end of the day -You can take Tylenol  up to 1000 mg 3 times daily, and ibuprofen  up to 600 mg 3 times daily with food.  You can take these together, or alternate every 3-4 hours. (If you are taking Meloxicam , then do not take other NSAIDs, including ibuprofen ). -Wrist brace while pain persists -Rest, ice -Follow-up with [facility] if symptoms persist.       ED  Prescriptions   None    PDMP not reviewed this encounter.   Arlyss Leita BRAVO, PA-C 10/08/24 1135

## 2024-10-29 ENCOUNTER — Emergency Department (HOSPITAL_COMMUNITY): Admission: EM | Admit: 2024-10-29 | Discharge: 2024-10-29 | Disposition: A | Discharge status: Home / Self Care

## 2024-10-29 ENCOUNTER — Encounter (HOSPITAL_COMMUNITY): Payer: Self-pay

## 2024-10-29 ENCOUNTER — Ambulatory Visit (HOSPITAL_COMMUNITY)
Admission: EM | Admit: 2024-10-29 | Discharge: 2024-10-29 | Disposition: A | Discharge status: Home / Self Care | Attending: Family Medicine | Admitting: Family Medicine

## 2024-10-29 ENCOUNTER — Other Ambulatory Visit: Payer: Self-pay

## 2024-10-29 ENCOUNTER — Emergency Department (HOSPITAL_COMMUNITY)

## 2024-10-29 ENCOUNTER — Encounter (HOSPITAL_COMMUNITY): Payer: Self-pay | Admitting: Emergency Medicine

## 2024-10-29 DIAGNOSIS — L049 Acute lymphadenitis, unspecified: Secondary | ICD-10-CM | POA: Insufficient documentation

## 2024-10-29 DIAGNOSIS — L040 Acute lymphadenitis of face, head and neck: Secondary | ICD-10-CM | POA: Diagnosis not present

## 2024-10-29 DIAGNOSIS — I889 Nonspecific lymphadenitis, unspecified: Secondary | ICD-10-CM | POA: Diagnosis not present

## 2024-10-29 DIAGNOSIS — L0211 Cutaneous abscess of neck: Secondary | ICD-10-CM

## 2024-10-29 DIAGNOSIS — M542 Cervicalgia: Secondary | ICD-10-CM | POA: Diagnosis present

## 2024-10-29 LAB — CBC WITH DIFFERENTIAL/PLATELET
Abs Immature Granulocytes: 0.03 K/uL (ref 0.00–0.07)
Basophils Absolute: 0 K/uL (ref 0.0–0.1)
Basophils Relative: 0 %
Eosinophils Absolute: 0.1 K/uL (ref 0.0–0.5)
Eosinophils Relative: 2 %
HCT: 39.8 % (ref 36.0–46.0)
Hemoglobin: 12.7 g/dL (ref 12.0–15.0)
Immature Granulocytes: 0 %
Lymphocytes Relative: 22 %
Lymphs Abs: 1.5 K/uL (ref 0.7–4.0)
MCH: 30.8 pg (ref 26.0–34.0)
MCHC: 31.9 g/dL (ref 30.0–36.0)
MCV: 96.6 fL (ref 80.0–100.0)
Monocytes Absolute: 0.5 K/uL (ref 0.1–1.0)
Monocytes Relative: 7 %
Neutro Abs: 4.9 K/uL (ref 1.7–7.7)
Neutrophils Relative %: 69 %
Platelets: 210 K/uL (ref 150–400)
RBC: 4.12 MIL/uL (ref 3.87–5.11)
RDW: 12.4 % (ref 11.5–15.5)
WBC: 7 K/uL (ref 4.0–10.5)
nRBC: 0 % (ref 0.0–0.2)

## 2024-10-29 LAB — BASIC METABOLIC PANEL WITH GFR
Anion gap: 11 (ref 5–15)
BUN: 10 mg/dL (ref 6–20)
CO2: 25 mmol/L (ref 22–32)
Calcium: 9 mg/dL (ref 8.9–10.3)
Chloride: 99 mmol/L (ref 98–111)
Creatinine, Ser: 0.73 mg/dL (ref 0.44–1.00)
GFR, Estimated: >60 mL/min (ref >60)
Glucose, Bld: 180 mg/dL — ABNORMAL HIGH (ref 70–99)
Potassium: 4.5 mmol/L (ref 3.5–5.1)
Sodium: 135 mmol/L (ref 135–145)

## 2024-10-29 LAB — I-STAT CG4 LACTIC ACID, ED: Lactic Acid, Venous: 1.4 mmol/L (ref 0.5–1.9)

## 2024-10-29 MED ORDER — AMOXICILLIN-POT CLAVULANATE 875-125 MG PO TABS: 1.0000 | ORAL_TABLET | Freq: Two times a day (BID) | ORAL | 0 refills | Status: AC

## 2024-10-29 MED ORDER — KETOROLAC TROMETHAMINE 15 MG/ML IJ SOLN
15.0000 mg | Freq: Once | INTRAMUSCULAR | Status: AC
  Administered 2024-10-29: 15 mg via INTRAVENOUS
  Filled 2024-10-29: qty 1

## 2024-10-29 MED ORDER — IOHEXOL 350 MG/ML SOLN
75.0000 mL | Freq: Once | INTRAVENOUS | Status: AC | PRN
  Administered 2024-10-29: 75 mL via INTRAVENOUS

## 2024-10-29 MED ORDER — AMOXICILLIN-POT CLAVULANATE 875-125 MG PO TABS
1.0000 | ORAL_TABLET | Freq: Once | ORAL | Status: AC
  Administered 2024-10-29: 1 via ORAL
  Filled 2024-10-29: qty 1

## 2024-10-29 MED ORDER — OXYCODONE-ACETAMINOPHEN 5-325 MG PO TABS: 1.0000 | ORAL_TABLET | Freq: Four times a day (QID) | ORAL | 0 refills | Status: AC | PRN

## 2024-10-29 NOTE — ED Triage Notes (Signed)
 Pt sent from urgent care for knot on right side of her neck that first appeared 3 days ago. Increasing in size and is painful to the touch.

## 2024-10-29 NOTE — ED Notes (Signed)
 Patient is being discharged from the Urgent Care and sent to the Emergency Department via pov . Per Dr Rolinda, patient is in need of higher level of care due to location of cyst and limited resources. Patient is aware and verbalizes understanding of plan of care.  Vitals:   10/29/24 1023  BP: 137/88  Pulse: 94  Resp: 18  Temp: 97.7 F (36.5 C)  SpO2: 97%

## 2024-10-29 NOTE — ED Provider Notes (Signed)
 [facility]   [id number] 10/29/24 Arrival Time: 0911  ASSESSMENT & PLAN:  1. Abscess, neck    Given size and location of likely abscess, will refer to the ED for evaluation; via POV; stable upon discharge.  Reviewed expectations re: course of current medical issues. Questions answered. Outlined signs and symptoms indicating need for more acute intervention. Patient verbalized understanding. After Visit Summary given.   SUBJECTIVE:  Brianna Velez is a 48 y.o. female who presents with a possible infection of her R neck; reports abrupt onset and rapid enlargement of side of R neck over past three days. No tx PTA. Is very painful. Denies injury/trauma/history of similar. Denies swallowing and breathing issues.  OBJECTIVE:  Vitals:   10/29/24 1023  BP: 137/88  Pulse: 94  Resp: 18  Temp: 97.7 F (36.5 C)  TempSrc: Oral  SpO2: 97%     General appearance: alert; no distress Neck: approx 3x3 area of swelling with surrounding skin thickening over R neck; reports TTP extending into R trapezius; without midline tenderness; normal jaw movements; without significant overlying erythema Psychological: alert and cooperative; normal mood and affect  Allergies  Allergen Reactions   Prednisone      Insomnia it has me up all night   Hydrocodone  Rash    Past Medical History:  Diagnosis Date   Anxiety and depression    Fibroids    History of 2019 novel coronavirus disease (COVID-19) 12/21/2019   History of hiatal hernia    Insomnia 02/11/2020   Leg pain    Migraine    Sciatica    Social History   Socioeconomic History   Marital status: Single    Spouse name: Not on file   Number of children: Not on file   Years of education: Not on file   Highest education level: Not on file  Occupational History   Not on file  Tobacco Use   Smoking status: Former    Current packs/day: 0.00    Types: Cigarettes    Quit date: 12/05/1996    Years since quitting: 27.9    Smokeless tobacco: Never  Vaping Use   Vaping status: Never Used  Substance and Sexual Activity   Alcohol use: No   Drug use: No   Sexual activity: Not Currently    Birth control/protection: Surgical  Other Topics Concern   Not on file  Social History Narrative   Not on file   Social Drivers of Health   Financial Resource Strain: Not on file  Food Insecurity: Food Insecurity Present (03/11/2024)   Hunger Vital Sign    Worried About Running Out of Food in the Last Year: Often true    Ran Out of Food in the Last Year: Often true  Transportation Needs: No Transportation Needs (03/11/2024)   PRAPARE - Administrator, Civil Service (Medical): No    Lack of Transportation (Non-Medical): No  Physical Activity: Not on file  Stress: Not on file  Social Connections: Not on file   Family History  Problem Relation Age of Onset   Diabetes Sister    Heart disease Paternal Grandmother    Hypertension Father    Dementia Mother    Hypertension Mother    Past Surgical History:  Procedure Laterality Date   ABDOMINAL HYSTERECTOMY  09/29/2015   Procedure: HYSTERECTOMY ABDOMINAL;  Surgeon: Winton Felt, MD;  Location: [facility];  Service: Gynecology;;   BILATERAL SALPINGECTOMY Bilateral 09/29/2015   Procedure: BILATERAL SALPINGECTOMY;  Surgeon: Winton Felt, MD;  Location: [facility];  Service: Gynecology;  Laterality: Bilateral;   TUBAL LIGATION     WISDOM TOOTH EXTRACTION              Rolinda Rogue, MD 10/29/24 1145

## 2024-10-29 NOTE — ED Provider Triage Note (Signed)
 Emergency Medicine Provider Triage Evaluation Note  Brianna Velez , a 48 y.o. female  was evaluated in triage.  Pt complains of neck mass. Report progressive worsening pain and swelling to R side of neck ongoing for 3-4 days.  Ttp.  No fever, chills, sore throat, ear pain.  No trauma, no weight loss.  No hx of CA, no hx of abscess. Sent here from [facility]  Review of Systems  Positive: As above Negative: As above  Physical Exam  BP (!) 139/96 (BP Location: Right Arm)   Pulse (!) 101   Temp 97.6 F (36.4 C)   Resp 18   LMP 08/14/2016   SpO2 99%  Gen:   Awake, no distress   Resp:  Normal effort  MSK:   Moves extremities without difficulty  Other:  Firm and indurated mass to R posterior neck, ttp, no pulsatile mass  Medical Decision Making  Medically screening exam initiated at 12:19 PM.  Appropriate orders placed.  Brianna Velez was informed that the remainder of the evaluation will be completed by another provider, this initial triage assessment does not replace that evaluation, and the importance of remaining in the ED until their evaluation is complete.     Nivia Colon, PA-C 10/29/24 1221

## 2024-10-29 NOTE — ED Triage Notes (Signed)
 Noticed knot on right side of neck 3 days. Patient reports 3 days ago, site was not as large as it is now.   Site is painful to touch.  Patient denies having anything like this before. Patient has used tylenol  for pain  patient denies right ear pain, denies sore throat.  Denies any illness in the last month.  There is a visible knot to right side of neck.    Patient appears sleepy, states she worked last night

## 2024-10-29 NOTE — Discharge Instructions (Signed)
 Your CT scan shows that there is an enlarged lymph node in your neck that is causing the pain.  Please take the antibiotic as prescribed.  Take your home meloxicam  for this.  If you are still having pain is okay to take the Percocet.  Do not drive or drink alcohol taking the Percocet as it may make you drowsy.  Please call to schedule a follow-up appointment with a ENT doctor at the number provided.  Return to the ER for worsening symptoms, difficulty breathing, swallowing, or other concerning symptoms.

## 2024-10-29 NOTE — ED Provider Notes (Signed)
 [place] EMERGENCY DEPARTMENT AT [facility] Provider Note   CSN: [id number] Arrival date & time: 10/29/24  1143     Patient presents with: No chief complaint on file.   Brianna Velez is a 48 y.o. female.   48 year old female with past medical history of hyperlipidemia and neuropathy presenting to the emergency department today with right sided neck pain and swelling.  This apparently been going on now for the past 2 to 3 days.  Is gotten a lot worse during that time.  She denies any difficulty breathing or swallowing.  Denies any fevers.  She went to urgent care initially and was sent to the ER for further evaluation regarding this.  She states that this is painful with movement or when she touches the area.        Prior to Admission medications   Medication Sig Start Date End Date Taking? Authorizing Provider  amoxicillin -clavulanate (AUGMENTIN ) 875-125 MG tablet Take 1 tablet by mouth every 12 (twelve) hours. 10/29/24  Yes Ula Prentice SAUNDERS, MD  oxyCODONE -acetaminophen  (PERCOCET/ROXICET) 5-325 MG tablet Take 1 tablet by mouth every 6 (six) hours as needed for severe pain (pain score 7-10). 10/29/24  Yes Ula Prentice SAUNDERS, MD  cyclobenzaprine  (FLEXERIL ) 10 MG tablet Take 1 tablet, by mouth, 3 times a day as needed. 09/26/24   Oley Bascom RAMAN, NP  DULoxetine  (CYMBALTA ) 20 MG capsule Take 1 capsule (20 mg total) by mouth daily. Patient not taking: Reported on 07/12/2024 05/06/24   Oley Bascom RAMAN, NP  estradiol  (ESTRACE ) 1 MG tablet TAKE 1 TABLET(1 MG) BY MOUTH DAILY 08/12/24   Nichols, Tonya S, NP  gabapentin  (NEURONTIN ) 600 MG tablet TAKE 1 TABLET(600 MG) BY MOUTH THREE TIMES DAILY 06/04/24   Paseda, Folashade R, FNP  meloxicam  (MOBIC ) 15 MG tablet TAKE 1 TABLET(15 MG) BY MOUTH DAILY 09/26/24   Nichols, Tonya S, NP  omega-3 acid ethyl esters (LOVAZA ) 1 g capsule Take 2 capsules (2 g total) by mouth daily. Patient not taking: Reported on 06/05/2023 05/31/22 06/05/23  Shannan Sia I, NP   pantoprazole  (PROTONIX ) 40 MG tablet TAKE 1 TABLET(40 MG) BY MOUTH TWICE DAILY AS NEEDED 07/05/24   Nichols, Tonya S, NP  rosuvastatin  (CRESTOR ) 5 MG tablet Take 1 tablet (5 mg total) by mouth daily. 06/05/23 07/12/24  Oley Bascom RAMAN, NP    Allergies: Prednisone  and Hydrocodone     Review of Systems  Musculoskeletal:  Positive for neck pain.  All other systems reviewed and are negative.   Updated Vital Signs BP (!) 139/106   Pulse 82   Temp 97.6 F (36.4 C)   Resp 18   LMP 08/14/2016   SpO2 97%   Physical Exam Vitals and nursing note reviewed.   Gen: NAD Eyes: PERRL, EOMI HEENT: no oropharyngeal swelling Neck: trachea midline, the patient has a firm indurated area with no fluctuance over the lateral aspect of her right neck but is tender to palpation with some palpable swollen lymph nodes noted surrounding the area Resp: clear to auscultation bilaterally Card: RRR, no murmurs, rubs, or gallops Abd: nontender, nondistended Extremities: no calf tenderness, no edema Vascular: 2+ radial pulses bilaterally, 2+ DP pulses bilaterally Skin: no rashes Psyc: acting appropriately   (all labs ordered are listed, but only abnormal results are displayed) Labs Reviewed  BASIC METABOLIC PANEL WITH GFR - Abnormal; Notable for the following components:      Result Value   Glucose, Bld 180 (*)    All other components within normal  limits  CULTURE, BLOOD (SINGLE)  CBC WITH DIFFERENTIAL/PLATELET  I-STAT CG4 LACTIC ACID, ED    EKG: None  Radiology: CT SOFT TISSUE NECK W CONTRAST Result Date: 10/29/2024 EXAM: CT NECK WITH CONTRAST 10/29/2024 01:16:53 PM TECHNIQUE: CT of the neck was performed with the administration of 75 mL of iohexol  (OMNIPAQUE ) 350 MG/ML injection. Multiplanar reformatted images are provided for review. Automated exposure control, iterative reconstruction, and/or weight based adjustment of the mA/kV was utilized to reduce the radiation dose to as low as reasonably  achievable. COMPARISON: None available. CLINICAL HISTORY: Neck mass (Ped 0-17y); neck mass, to R latero/posterior neck. FINDINGS: AERODIGESTIVE TRACT: No discrete mass. No edema. SALIVARY GLANDS: The parotid and submandibular glands are unremarkable. THYROID : Unremarkable. LYMPH NODES: There is a 1 x 1 x 1 cm peripherally enhancing fluid collection in the right occipital lymph node region. There are adjacent hyper-enhancing right level 2b lymph nodes measuring up to 1.1 x 0.7 cm. There is infiltration of the surrounding fat planes. Mild hyper-enhancement in the adjacent musculature and thickening of the overlying fascia. No gas or radiopaque foreign body. BRAIN, ORBITS, SINUSES AND MASTOIDS: No acute abnormality. LUNGS AND MEDIASTINUM: Hypoventilatory changes of the partially visualized lung apices. BONES: No focal bone abnormality. IMPRESSION: 1. 1 cm peripherally enhancing fluid collection in the right occipital lymph node region suspicious for abscess, possibly suppurative lymphadenitis, with inflammatory changes in the surrounding soft tissues. 2. Hyperenhancing adjacent right level 2b lymph nodes, presumably reactive. Electronically signed by: prentice spade 10/29/2024 01:56 PM EST RP Workstation: [id number]     Procedures   Medications Ordered in the ED  ketorolac  (TORADOL ) 15 MG/ML injection 15 mg (has no administration in time range)  amoxicillin -clavulanate (AUGMENTIN ) 875-125 MG per tablet 1 tablet (has no administration in time range)  iohexol  (OMNIPAQUE ) 350 MG/ML injection 75 mL (75 mLs Intravenous Contrast Given 10/29/24 1322)                                    Medical Decision Making 48 year old female with past medical history of hyperlipidemia presenting to the emergency department today with concern for possible neck abscess versus lymphadenopathy.  Patient evaluated basic labs here as well as a CT scan.  CT scan does show some possible lymphadenitis and a fluid collection deep to  the area on inspection.  I will call discuss her case with ENT to see if they recommend drainage versus IV/oral antibiotics to determine further plan of care.  The patient did drive here so we will hold off on any pain medications at this time until we figure out her disposition and will treat pain accordingly.  The patient's labs are reassuring.  CT scan was reviewed with Dr. Maggie from ENT.  Recommends starting the patient on Augmentin .  Patient given her first dose here.  Recommends outpatient follow-up as this is more consistent with suppurative lymphadenitis.  She is discharged with return precautions.  Risk Prescription drug management.        Final diagnoses:  Suppurative lymphadenitis    ED Discharge Orders          Ordered    amoxicillin -clavulanate (AUGMENTIN ) 875-125 MG tablet  Every 12 hours        10/29/24 1746    oxyCODONE -acetaminophen  (PERCOCET/ROXICET) 5-325 MG tablet  Every 6 hours PRN        10/29/24 1746  Ula Prentice SAUNDERS, MD 10/29/24 [phone]

## 2024-11-03 LAB — CULTURE, BLOOD (SINGLE): Culture: NO GROWTH

## 2024-11-15 DIAGNOSIS — L049 Acute lymphadenitis, unspecified: Secondary | ICD-10-CM | POA: Diagnosis not present

## 2024-11-29 ENCOUNTER — Telehealth: Payer: Self-pay

## 2024-11-29 ENCOUNTER — Other Ambulatory Visit: Payer: Self-pay

## 2024-11-29 DIAGNOSIS — R232 Flushing: Secondary | ICD-10-CM

## 2024-11-29 DIAGNOSIS — M792 Neuralgia and neuritis, unspecified: Secondary | ICD-10-CM

## 2024-11-29 MED ORDER — GABAPENTIN 600 MG PO TABS: ORAL_TABLET | ORAL | 0 refills | Status: AC

## 2024-11-29 NOTE — Telephone Encounter (Signed)
 Medication was refilled 11/29/24

## 2024-11-29 NOTE — Telephone Encounter (Signed)
 gabapentin  (NEURONTIN ) 600 MG tablet [[id number]]

## 2024-12-15 ENCOUNTER — Other Ambulatory Visit: Payer: Self-pay | Admitting: Nurse Practitioner

## 2024-12-15 DIAGNOSIS — R232 Flushing: Secondary | ICD-10-CM

## 2024-12-15 DIAGNOSIS — E2839 Other primary ovarian failure: Secondary | ICD-10-CM

## 2024-12-16 NOTE — Telephone Encounter (Signed)
 Please advise [facility]

## 2024-12-16 NOTE — Telephone Encounter (Signed)
 estradiol  (ESTRACE ) 1 MG tablet [Pharmacy Med Name: ESTRADIOL  1MG  TABLETS]

## 2024-12-30 ENCOUNTER — Emergency Department (HOSPITAL_COMMUNITY)

## 2024-12-30 ENCOUNTER — Other Ambulatory Visit: Payer: Self-pay

## 2024-12-30 ENCOUNTER — Encounter (HOSPITAL_COMMUNITY): Payer: Self-pay

## 2024-12-30 ENCOUNTER — Emergency Department (HOSPITAL_COMMUNITY)
Admission: EM | Admit: 2024-12-30 | Discharge: 2024-12-31 | Disposition: A | Attending: Emergency Medicine | Admitting: Emergency Medicine

## 2024-12-30 DIAGNOSIS — K219 Gastro-esophageal reflux disease without esophagitis: Secondary | ICD-10-CM | POA: Insufficient documentation

## 2024-12-30 DIAGNOSIS — R079 Chest pain, unspecified: Secondary | ICD-10-CM | POA: Diagnosis present

## 2024-12-30 LAB — BASIC METABOLIC PANEL WITH GFR
Anion gap: 13 (ref 5–15)
BUN: 14 mg/dL (ref 6–20)
CO2: 25 mmol/L (ref 22–32)
Calcium: 9.2 mg/dL (ref 8.9–10.3)
Chloride: 101 mmol/L (ref 98–111)
Creatinine, Ser: 0.9 mg/dL (ref 0.44–1.00)
GFR, Estimated: >60 mL/min
Glucose, Bld: 88 mg/dL (ref 70–99)
Potassium: 3.7 mmol/L (ref 3.5–5.1)
Sodium: 140 mmol/L (ref 135–145)

## 2024-12-30 LAB — CBC
HCT: 41.9 % (ref 36.0–46.0)
Hemoglobin: 13.3 g/dL (ref 12.0–15.0)
MCH: 30 pg (ref 26.0–34.0)
MCHC: 31.7 g/dL (ref 30.0–36.0)
MCV: 94.6 fL (ref 80.0–100.0)
Platelets: 208 10*3/uL (ref 150–400)
RBC: 4.43 MIL/uL (ref 3.87–5.11)
RDW: 11.9 % (ref 11.5–15.5)
WBC: 4.7 10*3/uL (ref 4.0–10.5)
nRBC: 0 % (ref 0.0–0.2)

## 2024-12-30 LAB — TROPONIN T, HIGH SENSITIVITY: Troponin T High Sensitivity: <6 ng/L (ref 0–19)

## 2024-12-30 MED ORDER — ALUM & MAG HYDROXIDE-SIMETH 200-200-20 MG/5ML PO SUSP
15.0000 mL | Freq: Once | ORAL | Status: AC
  Administered 2024-12-30: 15 mL via ORAL
  Filled 2024-12-30: qty 30

## 2024-12-30 MED ORDER — ACETAMINOPHEN 325 MG PO TABS
650.0000 mg | ORAL_TABLET | Freq: Once | ORAL | Status: AC
  Administered 2024-12-30: 650 mg via ORAL
  Filled 2024-12-30: qty 2

## 2024-12-30 MED ORDER — FAMOTIDINE IN NACL 20-0.9 MG/50ML-% IV SOLN: 20.0000 mg | Freq: Once | INTRAVENOUS | Status: DC

## 2024-12-30 MED ORDER — FAMOTIDINE 20 MG PO TABS
20.0000 mg | ORAL_TABLET | Freq: Once | ORAL | Status: AC
  Administered 2024-12-30: 20 mg via ORAL
  Filled 2024-12-30: qty 1

## 2024-12-30 NOTE — ED Provider Triage Note (Signed)
 Emergency Medicine Provider Triage Evaluation Note  Brianna Velez , a 49 y.o. female  was evaluated in triage.  Pt complains of chest pain.  Patient reports that she was diagnosed with a hiatal hernia and has been having some pain.  She does report however that this pain feels a little bit different than it has in the past.  This pain has been going on for about 2 days.  She denies any shortness of breath.  She denies any difficulty eating.  She rates the pain a 9 out of 10.  Patient was stable in triage.  Review of Systems  Positive: Chest pain Negative:   Physical Exam  BP 128/84 (BP Location: Right Arm)   Pulse 90   Temp 97.6 F (36.4 C)   Resp 18   LMP 08/14/2016  Gen:   Awake, no distress   Resp:  Normal effort  MSK:   Moves extremities without difficulty  Other:    Medical Decision Making  Medically screening exam initiated at 9:16 PM.  Appropriate orders placed.  Brianna Velez was informed that the remainder of the evaluation will be completed by another provider, this initial triage assessment does not replace that evaluation, and the importance of remaining in the ED until their evaluation is complete.     Brianna Velez, [place] 12/30/24 2119

## 2024-12-30 NOTE — ED Triage Notes (Signed)
 Pt reports she needs to see if the hernia in her chest has gotten bigger.

## 2024-12-30 NOTE — ED Triage Notes (Signed)
 Patient reports chest pain that she assumed was related to a hiatal hernial but states it has gotten worse over the last couple days. Patient reports 9/10 pain that is nagging and does not improve or worsen that she notices. Patient is a poor historian, difficult to assess details.

## 2024-12-30 NOTE — ED Provider Notes (Signed)
 " [place] EMERGENCY DEPARTMENT AT [facility] Provider Note   CSN: [id number] Arrival date & time: 12/30/24  2103     Patient presents with: Hernia   Brianna Velez is a 49 y.o. female.  Past Medical History:  Diagnosis Date   Anxiety and depression    Fibroids    History of 2019 novel coronavirus disease (COVID-19) 12/21/2019   History of hiatal hernia    Insomnia 02/11/2020   Leg pain    Migraine    Sciatica     HPI 49 yo female with PMH of hiatal hernia presents to ED with chest pain x 2 days.  Patient had hiatal hernia for past 2 years.  For the last 2 days patient endorses an intermittent nagging/throbbing pain in her chest.  States pain radiates to right arm.  States she has some shortness of breath when ambulating.  Patient has taken Tylenol  with some relief.  Denies a cardiac history.  Does endorse history of GERD.  Denies calf pain, lower extremity edema, cardiac history, fever, chills, weakness, tearing sensation with radiation to back       Prior to Admission medications  Medication Sig Start Date End Date Taking? Authorizing Provider  amoxicillin -clavulanate (AUGMENTIN ) 875-125 MG tablet Take 1 tablet by mouth every 12 (twelve) hours. 10/29/24   Ula Prentice SAUNDERS, MD  cyclobenzaprine  (FLEXERIL ) 10 MG tablet Take 1 tablet, by mouth, 3 times a day as needed. 09/26/24   Oley Bascom RAMAN, NP  DULoxetine  (CYMBALTA ) 20 MG capsule Take 1 capsule (20 mg total) by mouth daily. Patient not taking: Reported on 07/12/2024 05/06/24   Oley Bascom RAMAN, NP  estradiol  (ESTRACE ) 1 MG tablet TAKE 1 TABLET(1 MG) BY MOUTH DAILY 12/17/24   Nichols, Tonya S, NP  gabapentin  (NEURONTIN ) 600 MG tablet TAKE 1 TABLET(600 MG) BY MOUTH THREE TIMES DAILY 11/29/24   Nichols, Tonya S, NP  meloxicam  (MOBIC ) 15 MG tablet TAKE 1 TABLET(15 MG) BY MOUTH DAILY 09/26/24   Nichols, Tonya S, NP  omega-3 acid ethyl esters (LOVAZA ) 1 g capsule Take 2 capsules (2 g total) by mouth daily. Patient not  taking: Reported on 06/05/2023 05/31/22 06/05/23  Shannan Sia I, NP  oxyCODONE -acetaminophen  (PERCOCET/ROXICET) 5-325 MG tablet Take 1 tablet by mouth every 6 (six) hours as needed for severe pain (pain score 7-10). 10/29/24   Ula Prentice SAUNDERS, MD  pantoprazole  (PROTONIX ) 40 MG tablet TAKE 1 TABLET(40 MG) BY MOUTH TWICE DAILY AS NEEDED 07/05/24   Nichols, Tonya S, NP  rosuvastatin  (CRESTOR ) 5 MG tablet Take 1 tablet (5 mg total) by mouth daily. 06/05/23 07/12/24  Oley Bascom RAMAN, NP    Allergies: Prednisone  and Hydrocodone     Review of Systems  Cardiovascular:  Positive for chest pain.    Updated Vital Signs BP 128/84 (BP Location: Right Arm)   Pulse 90   Temp 97.6 F (36.4 C)   Resp 18   Ht 5' 4 (1.626 m)   Wt 90.7 kg   LMP 08/14/2016   BMI 34.33 kg/m   Physical Exam Vitals and nursing note reviewed.  Constitutional:      General: She is not in acute distress.    Appearance: She is well-developed.  HENT:     Head: Normocephalic and atraumatic.  Eyes:     Conjunctiva/sclera: Conjunctivae normal.  Cardiovascular:     Rate and Rhythm: Normal rate and regular rhythm.     Heart sounds: No murmur heard. Pulmonary:     Effort: Pulmonary effort  is normal. No respiratory distress.     Breath sounds: Normal breath sounds.  Abdominal:     Palpations: Abdomen is soft.     Tenderness: There is no abdominal tenderness.  Musculoskeletal:        General: No swelling.     Cervical back: Neck supple.     Right lower leg: No edema.     Left lower leg: No edema.  Skin:    General: Skin is warm and dry.     Capillary Refill: Capillary refill takes less than 2 seconds.  Neurological:     Mental Status: She is alert.     Motor: No weakness.  Psychiatric:        Mood and Affect: Mood normal.     (all labs ordered are listed, but only abnormal results are displayed) Labs Reviewed  BASIC METABOLIC PANEL WITH GFR  CBC  TROPONIN T, HIGH SENSITIVITY    EKG: None  Radiology: [facility] Chest  Port 1 View Result Date: 12/30/2024 EXAM: 1 VIEW XRAY OF THE CHEST 12/30/2024 09:46:00 PM COMPARISON: 04/01/2019 and CT chest 09/24/2017. CLINICAL HISTORY: Chest pain. FINDINGS: LUNGS AND PLEURA: No focal pulmonary opacity. No pleural effusion. No pneumothorax. HEART AND MEDIASTINUM: No acute abnormality of the cardiac and mediastinal silhouettes. BONES AND SOFT TISSUES: No acute osseous abnormality. IMPRESSION: 1. No acute cardiopulmonary abnormality. Electronically signed by: Morgane Naveau MD 12/30/2024 10:01 PM EST RP Workstation: [id number]     Procedures   Medications Ordered in the ED  famotidine  (PEPCID ) IVPB 20 mg premix (has no administration in time range)  alum & mag hydroxide-simeth (MAALOX/MYLANTA) 200-200-20 MG/5ML suspension 15 mL (has no administration in time range)  acetaminophen  (TYLENOL ) tablet 650 mg (has no administration in time range)                                    Medical Decision Making Risk OTC drugs. Prescription drug management.   This patient presents to the ED for concern of chest pain, this involves an extensive number of treatment options, and is a complaint that carries with it a high risk of complications and morbidity.  The differential diagnosis includes: STEMI vs NSTEMI vs GERD vs acute dissection vs PE    Lab Tests:  I Ordered, and personally interpreted labs.  The pertinent results include: Unremarkable BMP and CBC.  Negative troponin x 2   Imaging Studies ordered:  I ordered imaging studies including CXR  I independently visualized and interpreted imaging which showed no cardiopulmonary abnormalities I agree with the radiologist interpretation   Cardiac Monitoring:  The patient was maintained on a cardiac monitor.  I personally viewed and interpreted the cardiac monitored which showed an underlying rhythm of:  NSR   Medicines ordered and prescription drug management:  I ordered medication including famotidine  and  mylanta/maalox  for GERD  Reevaluation of the patient after these medicines showed that the patient improved I have reviewed the patients home medicines and have made adjustments as needed   Test Considered:  Considered D-dimer, however patient is not hypoxic or tachycardic, denies cough and laying comfortably in no acute distress. No history of blood clots. Patient presentation not consistent with PE. Discussed with DEVONNA Schlossman and he agrees.    Problem List / ED Course:  Patient presented with chest pain. Differentials included STEMI, NSTEMI, and GERD associated with hiatal hernia, and dissection.  Though patient endorses pain is  9 out of 10, patient appears in no acute distress.  Workup is negative for STEMI and NSTEMI.  Patient was given GI cocktail for possible worsening GERD due to hiatal hernia.  Patient endorsed good relief of pain giving high suspicion that chest pain was caused by worsening GERD.  I have little suspicion for acute dissection given patient is not hypertensive, had good relief from GI cocktail and appears in no acute distress. Patient safe for discharge with return precautions, PCP follow up, and pepcid  prescription. Directed patient to pick up OTC mylanta.   Reevaluation:  After the interventions noted above, I reevaluated the patient and found that they have :improved   Social Determinants of Health:  Pt has good PCP follow up   Dispostion:  After consideration of the diagnostic results and the patients response to treatment, I feel that the patent would benefit from discharge with PCP follow-up, return precautions.       Final diagnoses:  None    ED Discharge Orders     None          Harold Tillman ONEIDA DEVONNA 12/31/24 0032    Haze Lonni PARAS, MD 12/31/24 [phone]  "

## 2024-12-30 NOTE — ED Provider Notes (Incomplete)
 " [place] EMERGENCY DEPARTMENT AT [facility] Provider Note   CSN: [id number] Arrival date & time: 12/30/24  2103     Patient presents with: Hernia   Brianna Velez is a 49 y.o. female.  Past Medical History:  Diagnosis Date   Anxiety and depression    Fibroids    History of 2019 novel coronavirus disease (COVID-19) 12/21/2019   History of hiatal hernia    Insomnia 02/11/2020   Leg pain    Migraine    Sciatica     HPI 49 yo female with PMH of hiatal hernia presents to ED with chest pain x 2 days.  Patient had hiatal hernia for past 2 years.  For the last 2 days patient endorses an intermittent nagging/throbbing pain in her chest.  States pain radiates to right arm.  States she has some shortness of breath when ambulating.  Patient has taken Tylenol  with some relief.  Denies a cardiac history.  Does endorse history of GERD.  Denies calf pain, lower extremity edema, cardiac history, fever, chills, weakness, tearing sensation with radiation to back       Prior to Admission medications  Medication Sig Start Date End Date Taking? Authorizing Provider  amoxicillin -clavulanate (AUGMENTIN ) 875-125 MG tablet Take 1 tablet by mouth every 12 (twelve) hours. 10/29/24   Ula Prentice SAUNDERS, MD  cyclobenzaprine  (FLEXERIL ) 10 MG tablet Take 1 tablet, by mouth, 3 times a day as needed. 09/26/24   Oley Bascom RAMAN, NP  DULoxetine  (CYMBALTA ) 20 MG capsule Take 1 capsule (20 mg total) by mouth daily. Patient not taking: Reported on 07/12/2024 05/06/24   Oley Bascom RAMAN, NP  estradiol  (ESTRACE ) 1 MG tablet TAKE 1 TABLET(1 MG) BY MOUTH DAILY 12/17/24   Nichols, Tonya S, NP  gabapentin  (NEURONTIN ) 600 MG tablet TAKE 1 TABLET(600 MG) BY MOUTH THREE TIMES DAILY 11/29/24   Nichols, Tonya S, NP  meloxicam  (MOBIC ) 15 MG tablet TAKE 1 TABLET(15 MG) BY MOUTH DAILY 09/26/24   Nichols, Tonya S, NP  omega-3 acid ethyl esters (LOVAZA ) 1 g capsule Take 2 capsules (2 g total) by mouth daily. Patient  not taking: Reported on 06/05/2023 05/31/22 06/05/23  Shannan Sia I, NP  oxyCODONE -acetaminophen  (PERCOCET/ROXICET) 5-325 MG tablet Take 1 tablet by mouth every 6 (six) hours as needed for severe pain (pain score 7-10). 10/29/24   Ula Prentice SAUNDERS, MD  pantoprazole  (PROTONIX ) 40 MG tablet TAKE 1 TABLET(40 MG) BY MOUTH TWICE DAILY AS NEEDED 07/05/24   Nichols, Tonya S, NP  rosuvastatin  (CRESTOR ) 5 MG tablet Take 1 tablet (5 mg total) by mouth daily. 06/05/23 07/12/24  Oley Bascom RAMAN, NP    Allergies: Prednisone  and Hydrocodone     Review of Systems  Cardiovascular:  Positive for chest pain.    Updated Vital Signs BP 128/84 (BP Location: Right Arm)   Pulse 90   Temp 97.6 F (36.4 C)   Resp 18   Ht 5' 4 (1.626 m)   Wt 90.7 kg   LMP 08/14/2016   BMI 34.33 kg/m   Physical Exam Vitals and nursing note reviewed.  Constitutional:      General: She is not in acute distress.    Appearance: She is well-developed.  HENT:     Head: Normocephalic and atraumatic.  Eyes:     Conjunctiva/sclera: Conjunctivae normal.  Cardiovascular:     Rate and Rhythm: Normal rate and regular rhythm.     Heart sounds: No murmur heard. Pulmonary:     Effort: Pulmonary effort  is normal. No respiratory distress.     Breath sounds: Normal breath sounds.  Abdominal:     Palpations: Abdomen is soft.     Tenderness: There is no abdominal tenderness.  Musculoskeletal:        General: No swelling.     Cervical back: Neck supple.     Right lower leg: No edema.     Left lower leg: No edema.  Skin:    General: Skin is warm and dry.     Capillary Refill: Capillary refill takes less than 2 seconds.  Neurological:     Mental Status: She is alert.     Motor: No weakness.  Psychiatric:        Mood and Affect: Mood normal.     (all labs ordered are listed, but only abnormal results are displayed) Labs Reviewed  BASIC METABOLIC PANEL WITH GFR  CBC  TROPONIN T, HIGH SENSITIVITY    EKG: None  Radiology: [facility]  Chest Port 1 View Result Date: 12/30/2024 EXAM: 1 VIEW XRAY OF THE CHEST 12/30/2024 09:46:00 PM COMPARISON: 04/01/2019 and CT chest 09/24/2017. CLINICAL HISTORY: Chest pain. FINDINGS: LUNGS AND PLEURA: No focal pulmonary opacity. No pleural effusion. No pneumothorax. HEART AND MEDIASTINUM: No acute abnormality of the cardiac and mediastinal silhouettes. BONES AND SOFT TISSUES: No acute osseous abnormality. IMPRESSION: 1. No acute cardiopulmonary abnormality. Electronically signed by: Morgane Naveau MD 12/30/2024 10:01 PM EST RP Workstation: [id number]    {Document cardiac monitor, telemetry assessment procedure when appropriate:32947} Procedures   Medications Ordered in the ED  famotidine  (PEPCID ) IVPB 20 mg premix (has no administration in time range)  alum & mag hydroxide-simeth (MAALOX/MYLANTA) 200-200-20 MG/5ML suspension 15 mL (has no administration in time range)  acetaminophen  (TYLENOL ) tablet 650 mg (has no administration in time range)      {Click here for ABCD2, HEART and other calculators REFRESH Note before signing:1}                              Medical Decision Making Risk OTC drugs. Prescription drug management.   This patient presents to the ED for concern of chest pain, this involves an extensive number of treatment options, and is a complaint that carries with it a high risk of complications and morbidity.  The differential diagnosis includes: STEMI vs NSTEMI vs GERD vs acute dissection vs PE   Co morbidities that complicate the patient evaluation  ***    Lab Tests:  I Ordered, and personally interpreted labs.  The pertinent results include: Unremarkable BMP and CBC.  Negative troponin x    Imaging Studies ordered:  I ordered imaging studies including CXR  I independently visualized and interpreted imaging which showed no cardiopulmonary abnormalities I agree with the radiologist interpretation   Cardiac Monitoring:  The patient was maintained on a  cardiac monitor.  I personally viewed and interpreted the cardiac monitored which showed an underlying rhythm of:     Medicines ordered and prescription drug management:  I ordered medication including famotidine  and mylanta/maalox  for GERD  Reevaluation of the patient after these medicines showed that the patient improved I have reviewed the patients home medicines and have made adjustments as needed   Test Considered:  Considered D-dimer, however patient is not hypoxic or tachycardic, denies cough and laying comfortably in no acute distress. No history of blood clots. Patient presentation not consistent with PE. Discussed with DEVONNA Schlossman and he agrees.  Problem List / ED Course:  Patient presented with chest pain concerning for STEMI.  Differentials included STEMI, NSTEMI, and GERD associated with hiatal hernia.  Though patient endorses pain is 9 out of 10, patient appears in no acute distress.  Workup is negative for STEMI and NSTEMI.  Patient was given GI cocktail for possible worsening GERD due to hiatal hernia.  Patient endorsed good relief of pain given high suspicion that chest pain was caused by worsening GERD.  I have little suspicion for acute dissection given patient is not hypertensive, had good relief from GI cocktail and appears in no acute distress.   Reevaluation:  After the interventions noted above, I reevaluated the patient and found that they have :improved   Social Determinants of Health:  ***   Dispostion:  After consideration of the diagnostic results and the patients response to treatment, I feel that the patent would benefit from ***.    {Document critical care time when appropriate  Document review of labs and clinical decision tools ie CHADS2VASC2, etc  Document your independent review of radiology images and any outside records  Document your discussion with family members, caretakers and with consultants  Document social determinants of  health affecting pt's care  Document your decision making why or why not admission, treatments were needed:32947:::1}   Final diagnoses:  None    ED Discharge Orders     None        "

## 2024-12-31 LAB — TROPONIN T, HIGH SENSITIVITY: Troponin T High Sensitivity: <6 ng/L (ref 0–19)

## 2024-12-31 MED ORDER — FAMOTIDINE 20 MG PO TABS: 20.0000 mg | ORAL_TABLET | Freq: Two times a day (BID) | ORAL | 0 refills | Status: AC

## 2024-12-31 NOTE — Discharge Instructions (Addendum)
 Today you were seen in the ED for chest pain. Your workup was reassuring that you are not experiencing anything requiring immediate treatment. You were given medication during your visit for GERD.   I have sent a medication to your preferred pharmacy.  Please take as directed. As discussed please follow up with your PCP regarding your hiatal hernia.  Please return to the ED if you experience new or worsening chest pain, shortness of breath, weakness, fever, chills, or worsening symptoms.

## 2025-01-08 ENCOUNTER — Encounter: Payer: Self-pay | Admitting: Nurse Practitioner

## 2025-01-08 ENCOUNTER — Ambulatory Visit (INDEPENDENT_AMBULATORY_CARE_PROVIDER_SITE_OTHER): Payer: Self-pay | Admitting: Nurse Practitioner

## 2025-01-08 VITALS — BP 115/67 | HR 83 | Temp 97.4°F | Wt 192.8 lb

## 2025-01-08 DIAGNOSIS — Z5941 Food insecurity: Secondary | ICD-10-CM | POA: Diagnosis not present

## 2025-01-08 DIAGNOSIS — R1013 Epigastric pain: Secondary | ICD-10-CM

## 2025-01-08 DIAGNOSIS — K439 Ventral hernia without obstruction or gangrene: Secondary | ICD-10-CM | POA: Diagnosis not present

## 2025-01-08 NOTE — Progress Notes (Signed)
 "  Subjective   Patient ID: Brianna Velez, female    DOB: [birth year], 49 y.o.   MRN: [id number]  Chief Complaint  Patient presents with   Hospitalization Follow-up    Would like to see if still has the hernia thing going on.     Referring provider: Oley Bascom RAMAN, NP  Brianna Velez is a 49 y.o. female with Past Medical History: No date: Anxiety and depression No date: Fibroids 12/21/2019: History of 2019 novel coronavirus disease (COVID-19) No date: History of hiatal hernia 02/11/2020: Insomnia No date: Leg pain No date: Migraine No date: Sciatica   HPI  Brianna Velez 49 y.o. female  has a past medical history of Anxiety and depression, Fibroids, History of 2019 novel coronavirus disease (COVID-19) (12/21/2019), History of hiatal hernia, Insomnia (02/11/2020), Leg pain, Migraine, and Sciatica.    Patient presents today with epigastric abdominal pain.  This has been an ongoing issue.  She states it is progressively worsening.  She did have an abdominal CT in 2020 which did show ventral hernia.  Will place referral for her for general surgery for further evaluation and treatment.  Patient states that bowel movements have been regular. Denies f/c/s, n/v/d, hemoptysis, PND, leg swelling Denies chest pain or edema     Allergies[1]  Immunization History  Administered Date(s) Administered   Influenza,inj,Quad PF,6+ Mos 08/01/2017, 12/09/2017, 09/17/2018, 07/21/2019   Influenza-Unspecified 09/15/2021, 11/30/2022   Janssen (J&J) SARS-COV-2 Vaccination 04/20/2020   Unspecified SARS-COV-2 Vaccination 12/09/2020    Tobacco History: Tobacco Use History[2] Counseling given: Not Answered   Outpatient Encounter Medications as of 01/08/2025  Medication Sig   cyclobenzaprine  (FLEXERIL ) 10 MG tablet Take 1 tablet, by mouth, 3 times a day as needed.   DULoxetine  (CYMBALTA ) 20 MG capsule Take 1 capsule (20 mg total) by mouth daily.   estradiol  (ESTRACE ) 1 MG tablet TAKE 1 TABLET(1 MG)  BY MOUTH DAILY   famotidine  (PEPCID ) 20 MG tablet Take 1 tablet (20 mg total) by mouth 2 (two) times daily.   gabapentin  (NEURONTIN ) 600 MG tablet TAKE 1 TABLET(600 MG) BY MOUTH THREE TIMES DAILY   meloxicam  (MOBIC ) 15 MG tablet TAKE 1 TABLET(15 MG) BY MOUTH DAILY   pantoprazole  (PROTONIX ) 40 MG tablet TAKE 1 TABLET(40 MG) BY MOUTH TWICE DAILY AS NEEDED   amoxicillin -clavulanate (AUGMENTIN ) 875-125 MG tablet Take 1 tablet by mouth every 12 (twelve) hours. (Patient not taking: Reported on 01/08/2025)   omega-3 acid ethyl esters (LOVAZA ) 1 g capsule Take 2 capsules (2 g total) by mouth daily. (Patient not taking: Reported on 01/08/2025)   oxyCODONE -acetaminophen  (PERCOCET/ROXICET) 5-325 MG tablet Take 1 tablet by mouth every 6 (six) hours as needed for severe pain (pain score 7-10). (Patient not taking: Reported on 01/08/2025)   rosuvastatin  (CRESTOR ) 5 MG tablet Take 1 tablet (5 mg total) by mouth daily. (Patient not taking: Reported on 01/08/2025)   No facility-administered encounter medications on file as of 01/08/2025.    Review of Systems  Review of Systems  Constitutional: Negative.   HENT: Negative.    Cardiovascular:  Positive for chest pain.  Gastrointestinal:  Positive for abdominal pain.  Allergic/Immunologic: Negative.   Neurological: Negative.   Psychiatric/Behavioral: Negative.       Objective:   BP 115/67   Pulse 83   Temp (!) 97.4 F (36.3 C) (Temporal)   Wt 192 lb 12.8 oz (87.5 kg)   LMP 08/14/2016   SpO2 98%   BMI 33.09 kg/m   Wt Readings from Last 5 Encounters:  01/08/25 192 lb 12.8 oz (87.5 kg)  12/30/24 200 lb (90.7 kg)  07/12/24 196 lb 9.6 oz (89.2 kg)  03/11/24 202 lb 9.6 oz (91.9 kg)  06/05/23 201 lb (91.2 kg)     Physical Exam Vitals and nursing note reviewed.  Constitutional:      General: She is not in acute distress.    Appearance: She is well-developed.  Cardiovascular:     Rate and Rhythm: Normal rate and regular rhythm.  Pulmonary:     Effort:  Pulmonary effort is normal.     Breath sounds: Normal breath sounds.  Neurological:     Mental Status: She is alert and oriented to person, place, and time.       Assessment & Plan:   Food insecurity  Epigastric pain -     Ambulatory referral to General Surgery -     CBC -     Comprehensive metabolic panel with GFR  Ventral hernia without obstruction or gangrene -     Ambulatory referral to General Surgery     Return in about 3 months (around 04/07/2025).   Bascom GORMAN Borer, NP 01/08/2025     [1]  Allergies Allergen Reactions   Prednisone      Insomnia it has me up all night   Hydrocodone  Rash  [2]  Social History Tobacco Use  Smoking Status Former   Current packs/day: 0.00   Types: Cigarettes   Quit date: 12/05/1996   Years since quitting: 28.1  Smokeless Tobacco Never   "

## 2025-01-09 ENCOUNTER — Other Ambulatory Visit: Payer: Self-pay | Admitting: Nurse Practitioner

## 2025-01-09 ENCOUNTER — Ambulatory Visit: Payer: Self-pay | Admitting: Nurse Practitioner

## 2025-01-09 DIAGNOSIS — M545 Low back pain, unspecified: Secondary | ICD-10-CM

## 2025-01-09 DIAGNOSIS — G894 Chronic pain syndrome: Secondary | ICD-10-CM

## 2025-01-09 LAB — COMPREHENSIVE METABOLIC PANEL WITH GFR
ALT: 15 [IU]/L (ref 0–32)
AST: 15 [IU]/L (ref 0–40)
Albumin: 4.2 g/dL (ref 3.9–4.9)
Alkaline Phosphatase: 47 [IU]/L (ref 41–116)
BUN/Creatinine Ratio: 15 (ref 9–23)
BUN: 12 mg/dL (ref 6–24)
Bilirubin Total: 0.3 mg/dL (ref 0.0–1.2)
CO2: 23 mmol/L (ref 20–29)
Calcium: 9.3 mg/dL (ref 8.7–10.2)
Chloride: 102 mmol/L (ref 96–106)
Creatinine, Ser: 0.81 mg/dL (ref 0.57–1.00)
Globulin, Total: 2.9 g/dL (ref 1.5–4.5)
Glucose: 93 mg/dL (ref 70–99)
Potassium: 5 mmol/L (ref 3.5–5.2)
Sodium: 138 mmol/L (ref 134–144)
Total Protein: 7.1 g/dL (ref 6.0–8.5)
eGFR: 88 mL/min/{1.73_m2}

## 2025-01-09 LAB — CBC
Hematocrit: 40 % (ref 34.0–46.6)
Hemoglobin: 13 g/dL (ref 11.1–15.9)
MCH: 30.6 pg (ref 26.6–33.0)
MCHC: 32.5 g/dL (ref 31.5–35.7)
MCV: 94 fL (ref 79–97)
Platelets: 198 10*3/uL (ref 150–450)
RBC: 4.25 x10E6/uL (ref 3.77–5.28)
RDW: 12 % (ref 11.7–15.4)
WBC: 3.7 10*3/uL (ref 3.4–10.8)

## 2025-01-09 NOTE — Telephone Encounter (Signed)
 Please Advise.  CB.

## 2025-01-09 NOTE — Telephone Encounter (Signed)
 meloxicam  (MOBIC ) 15 MG tablet [Pharmacy Med Name: MELOXICAM  15MG  TABLETS]

## 2025-01-13 ENCOUNTER — Ambulatory Visit: Payer: Self-pay | Admitting: Nurse Practitioner
